# Patient Record
Sex: Female | Born: 1960 | Race: Black or African American | Hispanic: No | Marital: Single | Smoking: Never smoker
Health system: Southern US, Community
[De-identification: ages and names within clinical notes are randomized; demographics above are authoritative.]

## PROBLEM LIST (undated history)

## (undated) DIAGNOSIS — E119 Type 2 diabetes mellitus without complications: Secondary | ICD-10-CM

## (undated) DIAGNOSIS — G44009 Cluster headache syndrome, unspecified, not intractable: Secondary | ICD-10-CM

## (undated) DIAGNOSIS — G4730 Sleep apnea, unspecified: Secondary | ICD-10-CM

## (undated) DIAGNOSIS — B370 Candidal stomatitis: Secondary | ICD-10-CM

## (undated) DIAGNOSIS — T4145XA Adverse effect of unspecified anesthetic, initial encounter: Secondary | ICD-10-CM

## (undated) DIAGNOSIS — O009 Unspecified ectopic pregnancy without intrauterine pregnancy: Secondary | ICD-10-CM

## (undated) DIAGNOSIS — Z8639 Personal history of other endocrine, nutritional and metabolic disease: Secondary | ICD-10-CM

## (undated) DIAGNOSIS — T8859XA Other complications of anesthesia, initial encounter: Secondary | ICD-10-CM

## (undated) DIAGNOSIS — K50813 Crohn's disease of both small and large intestine with fistula: Secondary | ICD-10-CM

## (undated) DIAGNOSIS — R55 Syncope and collapse: Secondary | ICD-10-CM

## (undated) DIAGNOSIS — I10 Essential (primary) hypertension: Secondary | ICD-10-CM

## (undated) DIAGNOSIS — M109 Gout, unspecified: Secondary | ICD-10-CM

## (undated) DIAGNOSIS — K219 Gastro-esophageal reflux disease without esophagitis: Secondary | ICD-10-CM

## (undated) DIAGNOSIS — G4733 Obstructive sleep apnea (adult) (pediatric): Secondary | ICD-10-CM

## (undated) HISTORY — DX: Essential (primary) hypertension: I10

## (undated) HISTORY — PX: ECTOPIC PREGNANCY SURGERY: SHX613

## (undated) HISTORY — DX: Cluster headache syndrome, unspecified, not intractable: G44.009

## (undated) HISTORY — DX: Unspecified ectopic pregnancy without intrauterine pregnancy: O00.90

## (undated) HISTORY — DX: Sleep apnea, unspecified: G47.30

## (undated) HISTORY — DX: Type 2 diabetes mellitus without complications: E11.9

## (undated) HISTORY — DX: Obstructive sleep apnea (adult) (pediatric): G47.33

## (undated) HISTORY — PX: MYRINGOTOMY WITH TUBE PLACEMENT: SHX5663

## (undated) HISTORY — PX: TONSILLECTOMY: SUR1361

## (undated) HISTORY — DX: Candidal stomatitis: B37.0

## (undated) HISTORY — DX: Personal history of other endocrine, nutritional and metabolic disease: Z86.39

## (undated) HISTORY — PX: APPENDECTOMY: SHX54

## (undated) HISTORY — PX: TUBAL LIGATION: SHX77

---

## 1991-01-18 DIAGNOSIS — Z8639 Personal history of other endocrine, nutritional and metabolic disease: Secondary | ICD-10-CM

## 1991-01-18 HISTORY — DX: Personal history of other endocrine, nutritional and metabolic disease: Z86.39

## 1998-07-28 ENCOUNTER — Emergency Department (HOSPITAL_COMMUNITY): Admission: EM | Admit: 1998-07-28 | Discharge: 1998-07-28 | Payer: Self-pay | Admitting: Emergency Medicine

## 1998-07-28 ENCOUNTER — Encounter: Payer: Self-pay | Admitting: Emergency Medicine

## 1998-10-06 ENCOUNTER — Emergency Department (HOSPITAL_COMMUNITY): Admission: EM | Admit: 1998-10-06 | Discharge: 1998-10-06 | Payer: Self-pay | Admitting: Emergency Medicine

## 1998-10-06 ENCOUNTER — Encounter: Payer: Self-pay | Admitting: Emergency Medicine

## 2000-07-10 ENCOUNTER — Encounter: Payer: Self-pay | Admitting: Emergency Medicine

## 2000-07-10 ENCOUNTER — Emergency Department (HOSPITAL_COMMUNITY): Admission: EM | Admit: 2000-07-10 | Discharge: 2000-07-10 | Payer: Self-pay | Admitting: Emergency Medicine

## 2008-12-23 ENCOUNTER — Other Ambulatory Visit: Admission: RE | Admit: 2008-12-23 | Discharge: 2008-12-23 | Payer: Self-pay | Admitting: Family Medicine

## 2009-03-25 ENCOUNTER — Encounter: Admission: RE | Admit: 2009-03-25 | Discharge: 2009-03-25 | Payer: Self-pay | Admitting: Family Medicine

## 2009-05-22 ENCOUNTER — Emergency Department (HOSPITAL_BASED_OUTPATIENT_CLINIC_OR_DEPARTMENT_OTHER): Admission: EM | Admit: 2009-05-22 | Discharge: 2009-05-22 | Payer: Self-pay | Admitting: Emergency Medicine

## 2009-09-11 ENCOUNTER — Emergency Department (HOSPITAL_BASED_OUTPATIENT_CLINIC_OR_DEPARTMENT_OTHER): Admission: EM | Admit: 2009-09-11 | Discharge: 2009-09-11 | Payer: Self-pay | Admitting: Emergency Medicine

## 2009-11-17 ENCOUNTER — Encounter: Admission: RE | Admit: 2009-11-17 | Discharge: 2009-11-17 | Payer: Self-pay | Admitting: Otolaryngology

## 2010-01-17 HISTORY — PX: TONSILLECTOMY AND ADENOIDECTOMY: SHX28

## 2010-01-17 LAB — HM MAMMOGRAPHY: HM Mammogram: NORMAL

## 2010-01-17 LAB — HM PAP SMEAR: HM Pap smear: NORMAL

## 2010-02-07 ENCOUNTER — Encounter: Payer: Self-pay | Admitting: Obstetrics & Gynecology

## 2010-03-30 ENCOUNTER — Emergency Department (HOSPITAL_BASED_OUTPATIENT_CLINIC_OR_DEPARTMENT_OTHER)
Admission: EM | Admit: 2010-03-30 | Discharge: 2010-03-30 | Disposition: A | Discharge status: Home / Self Care | Payer: BC Managed Care – PPO | Attending: Emergency Medicine | Admitting: Emergency Medicine

## 2010-03-30 ENCOUNTER — Emergency Department (INDEPENDENT_AMBULATORY_CARE_PROVIDER_SITE_OTHER): Payer: BC Managed Care – PPO

## 2010-03-30 DIAGNOSIS — E119 Type 2 diabetes mellitus without complications: Secondary | ICD-10-CM | POA: Insufficient documentation

## 2010-03-30 DIAGNOSIS — E669 Obesity, unspecified: Secondary | ICD-10-CM | POA: Insufficient documentation

## 2010-03-30 DIAGNOSIS — R05 Cough: Secondary | ICD-10-CM

## 2010-03-30 DIAGNOSIS — I10 Essential (primary) hypertension: Secondary | ICD-10-CM | POA: Insufficient documentation

## 2010-03-30 DIAGNOSIS — R059 Cough, unspecified: Secondary | ICD-10-CM | POA: Insufficient documentation

## 2010-03-30 DIAGNOSIS — J329 Chronic sinusitis, unspecified: Secondary | ICD-10-CM | POA: Insufficient documentation

## 2010-08-13 ENCOUNTER — Ambulatory Visit: Payer: BC Managed Care – PPO | Admitting: Internal Medicine

## 2010-12-08 ENCOUNTER — Encounter: Payer: Self-pay | Admitting: Internal Medicine

## 2010-12-08 ENCOUNTER — Ambulatory Visit (INDEPENDENT_AMBULATORY_CARE_PROVIDER_SITE_OTHER): Payer: BC Managed Care – PPO | Admitting: Internal Medicine

## 2010-12-08 DIAGNOSIS — R739 Hyperglycemia, unspecified: Secondary | ICD-10-CM

## 2010-12-08 DIAGNOSIS — R7309 Other abnormal glucose: Secondary | ICD-10-CM

## 2010-12-08 DIAGNOSIS — G44009 Cluster headache syndrome, unspecified, not intractable: Secondary | ICD-10-CM | POA: Insufficient documentation

## 2010-12-08 DIAGNOSIS — I10 Essential (primary) hypertension: Secondary | ICD-10-CM

## 2010-12-08 DIAGNOSIS — E1122 Type 2 diabetes mellitus with diabetic chronic kidney disease: Secondary | ICD-10-CM | POA: Insufficient documentation

## 2010-12-08 DIAGNOSIS — M25561 Pain in right knee: Secondary | ICD-10-CM

## 2010-12-08 DIAGNOSIS — M7700 Medial epicondylitis, unspecified elbow: Secondary | ICD-10-CM

## 2010-12-08 DIAGNOSIS — M7701 Medial epicondylitis, right elbow: Secondary | ICD-10-CM

## 2010-12-08 DIAGNOSIS — M25569 Pain in unspecified knee: Secondary | ICD-10-CM

## 2010-12-08 MED ORDER — OLMESARTAN-AMLODIPINE-HCTZ 40-10-25 MG PO TABS: 1.0000 | ORAL_TABLET | Freq: Every day | ORAL | Status: DC

## 2010-12-08 MED ORDER — DICLOFENAC SODIUM 75 MG PO TBEC: 75.0000 mg | DELAYED_RELEASE_TABLET | Freq: Two times a day (BID) | ORAL | Status: DC

## 2010-12-08 NOTE — Patient Instructions (Signed)
It was good to see you today. we will send to your prior provider(s) for "release of records" as discussed today -  Resume TriBenzor for blood pressure control Start generic Voltaren for knee and elbow pain - 1 pill twice a day with food x 2 weeks, then as needed for pain Your prescription(s) have been submitted to your pharmacy. Please take as directed and contact our office if you believe you are having problem(s) with the medication(s). 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

## 2010-12-08 NOTE — Progress Notes (Signed)
Subjective:    Patient ID: Nancy Acevedo, female    DOB: 1960/01/30, 49 y.o.   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 Denies headache, chest pain or edema. No known personal cardiac history or renal disease  Past Medical History  Diagnosis Date  . Hypertension   . Headaches, cluster    Family History  Problem Relation Age of Onset  . Arthritis Other   . Diabetes Mother   . Hypertension Mother    History  Substance Use Topics  . Smoking status: Never Smoker   . Smokeless tobacco: Not on file  . Alcohol Use: No    Review of Systems Constitutional: Negative for fever or weight change.  Respiratory: Negative for cough and shortness of breath.   Cardiovascular: Negative for chest pain or palpitations.  Gastrointestinal: Negative for abdominal pain, no bowel changes.  Musculoskeletal: Negative for gait problem or joint swelling.  Skin: Negative for rash.  Neurological: Negative for dizziness or headache.  No other specific complaints in a complete review of systems (except as listed in HPI above).     Objective:   Physical Exam BP 152/104  Pulse 77  Temp(Src) 97.9 F (36.6 C) (Oral)  Ht 5\' 6"  (1.676 m)  Wt 269 lb (122.018 kg)  BMI 43.42 kg/m2  SpO2 97% Wt Readings from Last 3 Encounters:  12/08/10  269 lb (122.018 kg)   Constitutional: She is obese, but appears well-developed and well-nourished. No distress.  HENT: Head: Normocephalic and atraumatic. Ears: B TMs ok, no erythema or effusion; Nose: Nose normal.  Mouth/Throat: Oropharynx is clear and moist. No oropharyngeal exudate.  Eyes: Conjunctivae and EOM are normal. Pupils are equal, round, and reactive to light. No scleral icterus.  Neck: Normal range of motion. Neck supple. No JVD present. No thyromegaly present.  Cardiovascular: Normal rate, regular rhythm and normal heart sounds.  No murmur heard. No BLE edema. Pulmonary/Chest: Effort normal and breath sounds normal. No respiratory distress. She has no wheezes.  Abdominal: Soft. Bowel sounds are normal. She exhibits no distension. There is no tenderness. no masses Musculoskeletal: R elbow without deformity or swelling - tender over lateral epicondyle - R knee- +crepitus, tender to palpation over patellofemoral tendon, but FROM and ligamentous function intact  Normal range of motion, no joint effusions. No gross deformities Neurological: She is alert and oriented to person, place, and time. No cranial nerve deficit. Coordination normal.  Skin: Skin is warm and dry. No rash noted. No erythema.  Psychiatric: She has a normal mood and affect. Her behavior is normal. Judgment and thought content normal.   No results found for this basename: WBC, HGB, HCT, PLT, GLUCOSE, CHOL, TRIG, HDL, LDLDIRECT, LDLCALC, ALT, AST, NA, K, CL, CREATININE, BUN, CO2, TSH, PSA, INR,  GLUF, HGBA1C, MICROALBUR       Assessment & Plan:  R elbow pain - lateral epicondylitis due to repetative overuse - advised on dx and conservative tx - NSAIDs, counter force brace recommended  -   R knee pain - suspect patellofemoral tendonitis - NSAIDs, ice and strengthening exercises - consider PT - also advised on relation to weight and need for weight loss   Also see problem list. Medications and labs reviewed today.

## 2010-12-11 ENCOUNTER — Encounter: Payer: Self-pay | Admitting: Internal Medicine

## 2010-12-11 NOTE — Assessment & Plan Note (Signed)
Hx pregnancy induced hyperglycemia 1993 but "not diabetes mellitus after 3 hour test -GTT" Strong FH same and weight gain reviewed Check a1c and advised on need for healthy diet and exercise habits for weight reduction to control risk of DM dz No results found for this basename: HGBA1C

## 2010-12-11 NOTE — Assessment & Plan Note (Signed)
Resume triple therapy as recently prescribed =  advised on importance of med compliance and HTN association with other med dz if left uncontrolled Recheck 2-4 weeks and plan screening labs at that time  BP Readings from Last 3 Encounters:  12/08/10 152/104

## 2011-01-19 ENCOUNTER — Ambulatory Visit: Payer: BC Managed Care – PPO | Admitting: Internal Medicine

## 2011-02-17 ENCOUNTER — Ambulatory Visit (INDEPENDENT_AMBULATORY_CARE_PROVIDER_SITE_OTHER): Payer: BC Managed Care – PPO | Admitting: Internal Medicine

## 2011-02-17 ENCOUNTER — Other Ambulatory Visit (INDEPENDENT_AMBULATORY_CARE_PROVIDER_SITE_OTHER): Payer: BC Managed Care – PPO

## 2011-02-17 ENCOUNTER — Encounter: Payer: Self-pay | Admitting: Internal Medicine

## 2011-02-17 VITALS — BP 122/84 | HR 80 | Temp 98.5°F | Wt 266.4 lb

## 2011-02-17 DIAGNOSIS — R7989 Other specified abnormal findings of blood chemistry: Secondary | ICD-10-CM

## 2011-02-17 DIAGNOSIS — M25521 Pain in right elbow: Secondary | ICD-10-CM

## 2011-02-17 DIAGNOSIS — M25529 Pain in unspecified elbow: Secondary | ICD-10-CM

## 2011-02-17 DIAGNOSIS — Z0000 Encounter for general adult medical examination without abnormal findings: Secondary | ICD-10-CM

## 2011-02-17 DIAGNOSIS — M25561 Pain in right knee: Secondary | ICD-10-CM

## 2011-02-17 DIAGNOSIS — R7309 Other abnormal glucose: Secondary | ICD-10-CM

## 2011-02-17 DIAGNOSIS — I10 Essential (primary) hypertension: Secondary | ICD-10-CM

## 2011-02-17 DIAGNOSIS — R739 Hyperglycemia, unspecified: Secondary | ICD-10-CM

## 2011-02-17 DIAGNOSIS — M25569 Pain in unspecified knee: Secondary | ICD-10-CM

## 2011-02-17 DIAGNOSIS — Z23 Encounter for immunization: Secondary | ICD-10-CM

## 2011-02-17 DIAGNOSIS — R945 Abnormal results of liver function studies: Secondary | ICD-10-CM

## 2011-02-17 LAB — LIPID PANEL
LDL Cholesterol: 138 mg/dL — ABNORMAL HIGH (ref 0–99)
Total CHOL/HDL Ratio: 5
Triglycerides: 88 mg/dL (ref 0.0–149.0)

## 2011-02-17 LAB — BASIC METABOLIC PANEL
CO2: 29 mEq/L (ref 19–32)
Chloride: 106 mEq/L (ref 96–112)
Creatinine, Ser: 1 mg/dL (ref 0.4–1.2)
Glucose, Bld: 94 mg/dL (ref 70–99)

## 2011-02-17 LAB — URINALYSIS
Bilirubin Urine: NEGATIVE
Leukocytes, UA: NEGATIVE
Nitrite: NEGATIVE
Specific Gravity, Urine: 1.02 (ref 1.000–1.030)
Total Protein, Urine: NEGATIVE
pH: 5.5 (ref 5.0–8.0)

## 2011-02-17 LAB — CBC WITH DIFFERENTIAL/PLATELET
Basophils Absolute: 0.1 10*3/uL (ref 0.0–0.1)
Eosinophils Relative: 1.9 % (ref 0.0–5.0)
Hemoglobin: 11.9 g/dL — ABNORMAL LOW (ref 12.0–15.0)
Lymphs Abs: 1.9 10*3/uL (ref 0.7–4.0)
MCHC: 33.8 g/dL (ref 30.0–36.0)
MCV: 90.1 fl (ref 78.0–100.0)
Neutrophils Relative %: 51 % (ref 43.0–77.0)
RBC: 3.92 Mil/uL (ref 3.87–5.11)

## 2011-02-17 LAB — HEPATIC FUNCTION PANEL
Albumin: 4.2 g/dL (ref 3.5–5.2)
Alkaline Phosphatase: 49 U/L (ref 39–117)
Bilirubin, Direct: 0.1 mg/dL (ref 0.0–0.3)
Total Bilirubin: 0.6 mg/dL (ref 0.3–1.2)

## 2011-02-17 MED ORDER — METFORMIN HCL ER 500 MG PO TB24: 500.0000 mg | ORAL_TABLET | Freq: Every day | ORAL | Status: DC

## 2011-02-17 NOTE — Progress Notes (Signed)
Subjective:    Patient ID: Nancy Acevedo, female    DOB: 1960/03/26, 51 y.o.   MRN: [id number]  HPI  patient is here today for annual physical. 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 Denies headache, chest pain or edema. No known personal cardiac history or renal disease  Past Medical History  Diagnosis Date  . Hypertension   . Headaches, cluster   . History of hyperglycemia 1993   Family History  Problem Relation Age of Onset  . Arthritis Other   . Diabetes Mother   . Hypertension Mother    History  Substance Use Topics  . Smoking status: Never Smoker   . Smokeless tobacco: Not on file  . Alcohol Use: No    Review of Systems  Constitutional: Negative for fever or weight change.  Respiratory: Negative for cough and shortness of breath.   Cardiovascular: Negative for chest pain or palpitations.  Gastrointestinal: Negative for abdominal pain, no bowel changes.  Musculoskeletal: Negative for gait problem or joint swelling.  Skin: Negative for rash.  Neurological: Negative for dizziness or headache.  No other specific complaints in a complete review of systems (except as listed in HPI above).     Objective:   Physical Exam  BP 122/84  Pulse 80  Temp(Src) 98.5 F (36.9 C) (Oral)  Wt  266 lb 6.4 oz (120.838 kg)  SpO2 97% Wt Readings from Last 3 Encounters:  02/17/11 266 lb 6.4 oz (120.838 kg)  12/08/10 269 lb (122.018 kg)   Constitutional: She is obese, but appears well-developed and well-nourished. No distress.  HENT: NCAT, OP clear, TMs clear Eyes: uncorrected vision grossly intact - no icterus/conjunctivitis Neck: Thick; Normal range of motion. Neck supple. No JVD present. No thyromegaly present.  Cardiovascular: Normal rate, regular rhythm and normal heart sounds.  No murmur heard. No BLE edema. Pulmonary/Chest: Effort normal and breath sounds normal. No respiratory distress. She has no wheezes.  Abd: obese, SNTND, +BS Musculoskeletal: R elbow without deformity or swelling - tender over lateral epicondyle -  R knee: small mobile cyst lateral side of knee, +crepitus, tender to palpation over patellofemoral tendon and with palpation of "cyst", but FROM and ligamentous function intact- no joint effusions.  Neuro: AAOx4, CN 2-12 sym intact Skin: Skin is warm and dry. No rash noted. No erythema.  Psychiatric: She has a normal mood and affect. Her behavior is normal. Judgment and thought content normal.   No results found for this basename: WBC,  HGB,  HCT,  PLT,  GLUCOSE,  CHOL,  TRIG,  HDL,  LDLDIRECT,  LDLCALC,  ALT,  AST,  NA,  K,  CL,  CREATININE,  BUN,  CO2,  TSH,  PSA,  INR,  GLUF,  HGBA1C,  MICROALBUR  Assessment & Plan:  [facility] - v70.0 - Patient has been counseled on age-appropriate routine health concerns for screening and prevention. These are reviewed and up-to-date. Immunizations are up-to-date or declined. Labs ordered and will be reviewed.   R elbow pain -? lateral epicondylitis due to repetative overuse - improved but unresolved with conservative tx - NSAIDs, counter force brace recommended  - refer now to ortho  R knee pain - new small lateral "cyst" tender to touch - s/p NSAIDs, ice and strengthening exercises, minimally improved - refer to ortho and  consider PT - also advised on relation to weight and need for weight loss   Also see problem list. Medications and labs reviewed today.

## 2011-02-17 NOTE — Patient Instructions (Signed)
It was good to see you today. Test(s) ordered today. Your results will be called to you after review (48-72hours after test completion). If any changes need to be made, you will be notified at that time. we'll make referral to orthopedics for your knee and elbow pains. Our office will contact you regarding appointment(s) once made. Medications reviewed, no changes at this time. Refill on medication(s) as discussed today. Please schedule followup in 6 months for blood pressure check and weight check, call sooner if problems. Tdap vaccination updated today

## 2011-02-17 NOTE — Assessment & Plan Note (Signed)
Doing well on resumed Tribenzor (restarted 11/2010) The current medical regimen is effective;  continue present plan and medications.   BP Readings from Last 3 Encounters:  02/17/11 122/84  12/08/10 152/104

## 2011-02-17 NOTE — Assessment & Plan Note (Signed)
Incidental mild increase on [facility] labs - ?gatty liver or other Check Abd [place] now - no symptomatic GI dz on hx or exam

## 2011-02-17 NOTE — Assessment & Plan Note (Signed)
Hx pregnancy induced hyperglycemia 1993 but "not diabetes mellitus after 3 hour test -GTT" Strong FH same and weight gain reviewed Check a1c and advised on need for healthy diet and exercise habits for weight reduction to control risk of DM dz No results found for this basename: HGBA1C    

## 2011-02-18 ENCOUNTER — Ambulatory Visit: Payer: BC Managed Care – PPO | Admitting: Internal Medicine

## 2011-02-21 ENCOUNTER — Other Ambulatory Visit: Payer: BC Managed Care – PPO

## 2011-02-22 ENCOUNTER — Telehealth: Payer: Self-pay | Admitting: *Deleted

## 2011-02-22 DIAGNOSIS — E119 Type 2 diabetes mellitus without complications: Secondary | ICD-10-CM

## 2011-02-22 NOTE — Telephone Encounter (Signed)
Will do! thanks

## 2011-02-22 NOTE — Telephone Encounter (Signed)
Inform pt of labs results & md recommendations. Pt states she does want md to do a referral for her to see nutritionist.... 02/22/11@12 :58pm/LMB

## 2011-02-22 NOTE — Telephone Encounter (Signed)
Pt is aware of referral. Will received call back from [facility] once referral has been set-up... 02/22/11@2 :16pm/LB

## 2011-02-25 ENCOUNTER — Encounter: Payer: BC Managed Care – PPO | Admitting: *Deleted

## 2011-04-25 ENCOUNTER — Ambulatory Visit: Payer: BC Managed Care – PPO | Admitting: *Deleted

## 2011-04-25 ENCOUNTER — Encounter: Payer: Self-pay | Admitting: *Deleted

## 2011-05-18 ENCOUNTER — Encounter: Payer: Self-pay | Admitting: Internal Medicine

## 2011-05-18 ENCOUNTER — Ambulatory Visit (INDEPENDENT_AMBULATORY_CARE_PROVIDER_SITE_OTHER): Payer: BC Managed Care – PPO | Admitting: Internal Medicine

## 2011-05-18 VITALS — BP 130/82 | HR 91 | Temp 97.9°F

## 2011-05-18 DIAGNOSIS — M25571 Pain in right ankle and joints of right foot: Secondary | ICD-10-CM

## 2011-05-18 DIAGNOSIS — M67971 Unspecified disorder of synovium and tendon, right ankle and foot: Secondary | ICD-10-CM

## 2011-05-18 DIAGNOSIS — M25579 Pain in unspecified ankle and joints of unspecified foot: Secondary | ICD-10-CM

## 2011-05-18 NOTE — Progress Notes (Signed)
  Subjective:    Patient ID: Nancy Acevedo, female    DOB: May 24, 1960, 51 y.o.   MRN: [id number]  HPI Here for right ankle pain Precipitated by getting out of bed - denies injury or twist, no overuse Seen at [facility] ER 4/29 due to same>dx with sprain - instructed on ortho RICE and given ibuprofen + percocet  Increasing pain and difficulty walking - stays up on R foot toes due to pain in back of heel   Also here for follow up   DM2, mild - started metformin for same 01/2011 following dx at [facility]. the patient reports compliance with medication(s) as prescribed. Denies adverse side effects.  Hypertension - the patient reports compliance with medication(s) as prescribed. Denies adverse side effects.  Past Medical History  Diagnosis Date  . Hypertension   . Headaches, cluster   . History of hyperglycemia 1993  . Diabetes mellitus, type 2 dx 01/2011    Review of Systems  Constitutional: Negative for fever.  Musculoskeletal: Positive for gait problem. Negative for myalgias, back pain and joint swelling.       Objective:   Physical Exam BP 130/82  Pulse 91  Temp(Src) 97.9 F (36.6 C) (Oral)  SpO2 98% Constitutional: She appears well-developed and well-nourished. No distress. Cardiovascular: Normal rate, regular rhythm and normal heart sounds.  No murmur heard. No edema. Pulmonary/Chest: Effort normal and breath sounds normal. No respiratory distress. She has no wheezes.  Musculoskeletal: R ankle - mild diffuse swelling at achilles insertion, limited dorsiflexion due to pain - tender to palpation over distal Achilles. passive FROM and ligamentous fx intact - neurovasc intact distally  Lab Results  Component Value Date   WBC 5.2 02/17/2011   HGB 11.9* 02/17/2011   HCT 35.3* 02/17/2011   PLT 294.0 02/17/2011   GLUCOSE 94 02/17/2011   CHOL 199 02/17/2011   TRIG 88.0 02/17/2011   HDL 43.80 02/17/2011   LDLCALC 138* 02/17/2011   ALT 80* 02/17/2011   AST 64* 02/17/2011   NA 142 02/17/2011   K  4.1 02/17/2011   CL 106 02/17/2011   CREATININE 1.0 02/17/2011   BUN 13 02/17/2011   CO2 29 02/17/2011   TSH 0.98 02/17/2011   HGBA1C 6.8* 02/17/2011      Assessment & Plan:   R ankle pain - reports xrays done at [facility] negative - exam consistent with Achilles tendonitis, ?partial tear - no relief with high dose NSAIDs in last 24h - refer to ortho for eval today and PT

## 2011-05-18 NOTE — Patient Instructions (Signed)
It was good to see you today. We have reviewed your [place] [facility] records today we'll make referral to [facility] for today. Our office will contact you regarding appointment(s) once made.  Achilles Tendinitis Tendinitis a swelling and soreness of the tendon. The pain in the tendon (cord-like structure which attaches muscle to bone) is produced by tiny tears and the inflammation present in that tendon. It commonly occurs at the shoulders, heels, and elbows. It is usually caused by overusing the tendon and joint involved. Achilles tendinitis involves the Achilles tendon. This is the large tendon in the back of the leg just above the foot. It attaches the large muscles of the lower leg to the heel bone (called calcaneus).   This diagnosis (learning what is wrong) is made by examination. X-rays will be generally be normal if only tendinitis is present. HOME CARE INSTRUCTIONS    Apply ice to the injury for 15 to 20 minutes, 3 to 4 times per day. Put the ice in a plastic bag and place a towel between the bag of ice and your skin.   Try to avoid use other than gentle range of motion while the tendon is painful. Do not resume use until instructed by your caregiver. Then begin use gradually. Do not increase use to the point of pain. If pain does develop, decrease use and continue the above measures. Gradually increase activities that do not cause discomfort until you gradually achieve normal use.   Only take over-the-counter or prescription medicines for pain, discomfort, or fever as directed by your caregiver.  SEEK MEDICAL CARE IF:    Your pain and swelling increase or pain is uncontrolled with medications.   You develop new, unexplained problems (symptoms) or an increase of the symptoms that brought you to your caregiver.   You develop an inability to move your toes or foot, develop warmth and swelling in your foot, or begin running an unexplained temperature.  MAKE SURE YOU:    Understand these  instructions.   Will watch your condition.   Will get help right away if you are not doing well or get worse.  Document Released: 10/13/2004 Document Revised: 12/23/2010 Document Reviewed: 08/22/2007 [facility] Patient Information 2012 [place], [place].

## 2011-05-27 ENCOUNTER — Ambulatory Visit: Payer: BC Managed Care – PPO | Admitting: Internal Medicine

## 2011-06-17 ENCOUNTER — Ambulatory Visit (INDEPENDENT_AMBULATORY_CARE_PROVIDER_SITE_OTHER): Payer: BC Managed Care – PPO | Admitting: Internal Medicine

## 2011-06-17 ENCOUNTER — Encounter: Payer: Self-pay | Admitting: Internal Medicine

## 2011-06-17 ENCOUNTER — Other Ambulatory Visit (INDEPENDENT_AMBULATORY_CARE_PROVIDER_SITE_OTHER): Payer: BC Managed Care – PPO

## 2011-06-17 VITALS — BP 126/82 | HR 91 | Temp 98.6°F | Wt 264.1 lb

## 2011-06-17 DIAGNOSIS — L309 Dermatitis, unspecified: Secondary | ICD-10-CM

## 2011-06-17 DIAGNOSIS — L259 Unspecified contact dermatitis, unspecified cause: Secondary | ICD-10-CM

## 2011-06-17 DIAGNOSIS — E119 Type 2 diabetes mellitus without complications: Secondary | ICD-10-CM

## 2011-06-17 DIAGNOSIS — F329 Major depressive disorder, single episode, unspecified: Secondary | ICD-10-CM | POA: Insufficient documentation

## 2011-06-17 DIAGNOSIS — E669 Obesity, unspecified: Secondary | ICD-10-CM | POA: Insufficient documentation

## 2011-06-17 LAB — HEMOGLOBIN A1C: Hgb A1c MFr Bld: 6.5 % (ref 4.6–6.5)

## 2011-06-17 MED ORDER — BUPROPION HCL ER (XL) 150 MG PO TB24: 150.0000 mg | ORAL_TABLET | Freq: Every day | ORAL | Status: DC

## 2011-06-17 MED ORDER — TRIAMCINOLONE ACETONIDE 0.1 % EX CREA: TOPICAL_CREAM | Freq: Two times a day (BID) | CUTANEOUS | Status: DC

## 2011-06-17 NOTE — Assessment & Plan Note (Signed)
Classic symptoms present >35mo Self tx with eating binge Start wellbutrin - we reviewed potential risk/benefit and possible side effects - pt understands and agrees to same  follow up 6 weeks, sooner if problems Verified no I/HI

## 2011-06-17 NOTE — Assessment & Plan Note (Signed)
Wt Readings from Last 3 Encounters:  06/17/11 264 lb 1.9 oz (119.804 kg)  02/17/11 266 lb 6.4 oz (120.838 kg)  12/08/10 269 lb (122.018 kg)   The patient is asked to make an attempt to improve diet and exercise patterns to aid in medical management of this problem. Refer for consideration of bariatric intervention

## 2011-06-17 NOTE — Assessment & Plan Note (Signed)
Hx pregnancy induced hyperglycemia 1993 but "not diabetes mellitus after 3 hour test -GTT" New dx 01/2011 - started metformin, s/p nutrition eval for same recheck a1c now, adjust meds prn Again advised on need for healthy diet and exercise habits for weight reduction to control risk of DM dz Lab Results  Component Value Date   HGBA1C 6.8* 02/17/2011

## 2011-06-17 NOTE — Progress Notes (Signed)
  Subjective:    Patient ID: Nancy Acevedo, female    DOB: 05/24/1960, 51 y.o.   MRN: [id number]  HPI  Here for follow up   DM2 - started metformin late 1/20913 for same - the patient reports compliance with medication(s) as prescribed. Denies adverse side effects.  Obesity - frustrated with inability to lose weight - walking and cooking at home instead of eating out - unable to afford weight watchers  HTN - the patient reports compliance with medication(s) as prescribed. Denies adverse side effects.   Past Medical History  Diagnosis Date  . Hypertension   . Headaches, cluster   . History of hyperglycemia 1993  . Diabetes mellitus, type 2 dx 01/2011    Review of Systems Skin: itch b thighs due to "sweat" Psyc: complains of depression symptoms: poor sleep, irritability, short fuse, feeling hopeless - denies SI    Objective:   Physical Exam BP 126/82  Pulse 91  Temp(Src) 98.6 F (37 C) (Oral)  Wt 264 lb 1.9 oz (119.804 kg)  SpO2 98% Wt Readings from Last 3 Encounters:  06/17/11 264 lb 1.9 oz (119.804 kg)  02/17/11 266 lb 6.4 oz (120.838 kg)  12/08/10 269 lb (122.018 kg)   Constitutional: She is obese, but appears well-developed and well-nourished. No distress.  Neck: Normal range of motion. Neck supple. No JVD present. No thyromegaly present.  Cardiovascular: Normal rate, regular rhythm and normal heart sounds.  No murmur heard. No BLE edema. Pulmonary/Chest: Effort normal and breath sounds normal. No respiratory distress. She has no wheezes.  Skin: Mild contact dermatitis B inner thighs - oher skin is warm and dry. No rash noted. No erythema.  Psychiatric: She has a depressed mood and tearful affect. Her behavior is normal. Judgment and thought content normal.   Lab Results  Component Value Date   WBC 5.2 02/17/2011   HGB 11.9* 02/17/2011   HCT 35.3* 02/17/2011   PLT 294.0 02/17/2011   GLUCOSE 94 02/17/2011   CHOL 199 02/17/2011   TRIG 88.0 02/17/2011   HDL 43.80  02/17/2011   LDLCALC 138* 02/17/2011   ALT 80* 02/17/2011   AST 64* 02/17/2011   NA 142 02/17/2011   K 4.1 02/17/2011   CL 106 02/17/2011   CREATININE 1.0 02/17/2011   BUN 13 02/17/2011   CO2 29 02/17/2011   TSH 0.98 02/17/2011   HGBA1C 6.8* 02/17/2011      Assessment & Plan:  See problem list. Medications and labs reviewed today.   dermatitis - triamcinolone prn

## 2011-06-17 NOTE — Patient Instructions (Signed)
It was good to see you today. Test(s) ordered today. Your results will be called to you after review (48-72hours after test completion). If any changes need to be made, you will be notified at that time. Start wellbutrin daily for depression symptoms -Your prescription(s) have been submitted to your pharmacy. Please take as directed and contact our office if you believe you are having problem(s) with the medication(s). we'll make referral to bariatric meeting for possible help with weight loss goals . Our office will contact you regarding appointment(s) once made. Please schedule followup in 6 weeks to recheck depression and medications, call sooner if problems.

## 2011-07-11 ENCOUNTER — Ambulatory Visit: Payer: BC Managed Care – PPO | Admitting: Internal Medicine

## 2011-07-11 DIAGNOSIS — Z0289 Encounter for other administrative examinations: Secondary | ICD-10-CM

## 2011-07-22 ENCOUNTER — Ambulatory Visit (INDEPENDENT_AMBULATORY_CARE_PROVIDER_SITE_OTHER): Payer: BC Managed Care – PPO | Admitting: Surgery

## 2011-07-26 ENCOUNTER — Encounter (INDEPENDENT_AMBULATORY_CARE_PROVIDER_SITE_OTHER): Payer: Self-pay | Admitting: Surgery

## 2011-08-17 ENCOUNTER — Ambulatory Visit: Payer: BC Managed Care – PPO | Admitting: Internal Medicine

## 2011-08-26 ENCOUNTER — Ambulatory Visit (INDEPENDENT_AMBULATORY_CARE_PROVIDER_SITE_OTHER): Payer: BC Managed Care – PPO | Admitting: Internal Medicine

## 2011-08-26 ENCOUNTER — Encounter: Payer: Self-pay | Admitting: Internal Medicine

## 2011-08-26 VITALS — BP 132/100 | HR 73 | Temp 98.5°F | Ht 66.0 in | Wt 274.8 lb

## 2011-08-26 DIAGNOSIS — R05 Cough: Secondary | ICD-10-CM

## 2011-08-26 DIAGNOSIS — E669 Obesity, unspecified: Secondary | ICD-10-CM

## 2011-08-26 DIAGNOSIS — F329 Major depressive disorder, single episode, unspecified: Secondary | ICD-10-CM

## 2011-08-26 MED ORDER — BENZONATATE 100 MG PO CAPS: 100.0000 mg | ORAL_CAPSULE | Freq: Two times a day (BID) | ORAL | Status: AC | PRN

## 2011-08-26 MED ORDER — FAMOTIDINE 20 MG PO TABS: 20.0000 mg | ORAL_TABLET | Freq: Two times a day (BID) | ORAL | Status: DC

## 2011-08-26 MED ORDER — PAROXETINE HCL 10 MG PO TABS: 10.0000 mg | ORAL_TABLET | ORAL | Status: DC

## 2011-08-26 MED ORDER — PHENTERMINE HCL 37.5 MG PO CAPS: 37.5000 mg | ORAL_CAPSULE | ORAL | Status: DC

## 2011-08-26 NOTE — Progress Notes (Signed)
  Subjective:    Patient ID: Nancy Acevedo, female    DOB: 1960-12-11, 51 y.o.   MRN: [id number]  HPI   Here for follow up depression - started Wellbutrin 5/31 - took for 4 weeks but stopped same due to "feeling weird and disconnected" - still poor sleep, irritability, short fuse, and hopeless - denies SI. continued stressors and would consider alt therapy if needed  DM2 - started metformin late 1/20913 for same - the patient reports compliance with medication(s) as prescribed. Denies adverse side effects.  Obesity - frustrated with inability to lose weight - walking and cooking at home instead of eating out - unable to afford weight watchers or bariatric surgery  HTN - the patient reports compliance with medication(s) as prescribed. Denies adverse side effects.   Past Medical History  Diagnosis Date  . Hypertension   . Headaches, cluster   . History of hyperglycemia 1993  . Diabetes mellitus, type 2 dx 01/2011    Review of Systems  Constitutional: Positive for fatigue. Negative for fever.  Respiratory: Positive for cough. Negative for shortness of breath.   Cardiovascular: Negative for chest pain and leg swelling.        Objective:   Physical Exam  BP 132/100  Pulse 73  Temp 98.5 F (36.9 C) (Oral)  Ht 5\' 6"  (1.676 m)  Wt 274 lb 12.8 oz (124.648 kg)  BMI 44.35 kg/m2  SpO2 97% Wt Readings from Last 3 Encounters:  08/26/11 274 lb 12.8 oz (124.648 kg)  06/17/11 264 lb 1.9 oz (119.804 kg)  02/17/11 266 lb 6.4 oz (120.838 kg)   Constitutional: She is obese, but appears well-developed and well-nourished. No distress.  Neck: Normal range of motion. Neck supple. No JVD present. No thyromegaly present.  Cardiovascular: Normal rate, regular rhythm and normal heart sounds.  No murmur heard. No BLE edema. Pulmonary/Chest: Effort normal and breath sounds normal. No respiratory distress. She has no wheezes.  Psychiatric: She has a depressed mood and tearful affect. Her behavior  is normal. Judgment and thought content normal.   Lab Results  Component Value Date   WBC 5.2 02/17/2011   HGB 11.9* 02/17/2011   HCT 35.3* 02/17/2011   PLT 294.0 02/17/2011   GLUCOSE 94 02/17/2011   CHOL 199 02/17/2011   TRIG 88.0 02/17/2011   HDL 43.80 02/17/2011   LDLCALC 138* 02/17/2011   ALT 80* 02/17/2011   AST 64* 02/17/2011   NA 142 02/17/2011   K 4.1 02/17/2011   CL 106 02/17/2011   CREATININE 1.0 02/17/2011   BUN 13 02/17/2011   CO2 29 02/17/2011   TSH 0.98 02/17/2011   HGBA1C 6.5 06/17/2011      Assessment & Plan:   Cough - dry but ongoing x 4 weeks - exam clear and O2 normal - tessalon prn  See problem list. Medications and labs reviewed today.

## 2011-08-26 NOTE — Patient Instructions (Signed)
It was good to see you today. Start paxil daily for depression symptoms, tessalon as needed for cough, phentermine for weight loss and pepcid for cough ever day - Your prescription(s) have been given to you or submitted to your pharmacy. Please take as directed and contact our office if you believe you are having problem(s) with the medication(s). Please schedule followup in 6 weeks to recheck depression and medications, call sooner if problems.

## 2011-08-26 NOTE — Assessment & Plan Note (Signed)
Wt Readings from Last 3 Encounters:  08/26/11 274 lb 12.8 oz (124.648 kg)  06/17/11 264 lb 1.9 oz (119.804 kg)  02/17/11 266 lb 6.4 oz (120.838 kg)   The patient is asked to make an attempt to improve diet and exercise patterns to aid in medical management of this problem. Unable to consider bariatric intervention due to cost Trial phentermine x 30d -

## 2011-08-26 NOTE — Assessment & Plan Note (Signed)
Classic symptoms present >35mo Self tx with eating binge Intol of wellbutrin 06/2011 - self dc'd same Start paxil now - we reviewed potential risk/benefit and possible side effects - pt understands and agrees to same  follow up 6 weeks, sooner if problems Verified no SI/HI

## 2011-10-10 ENCOUNTER — Ambulatory Visit: Payer: BC Managed Care – PPO | Admitting: Internal Medicine

## 2011-10-10 DIAGNOSIS — Z0289 Encounter for other administrative examinations: Secondary | ICD-10-CM

## 2011-11-15 ENCOUNTER — Other Ambulatory Visit: Payer: Self-pay | Admitting: *Deleted

## 2011-11-15 MED ORDER — OLMESARTAN-AMLODIPINE-HCTZ 40-10-25 MG PO TABS: 1.0000 | ORAL_TABLET | Freq: Every day | ORAL | Status: DC

## 2011-11-15 NOTE — Telephone Encounter (Signed)
Left msg on vm needing new rx on BP med. She is completely out. Called pt back no answer LMOM will send Tribenzor to [facility].....Raechel Chute

## 2012-02-24 ENCOUNTER — Encounter: Payer: Self-pay | Admitting: Internal Medicine

## 2012-02-24 ENCOUNTER — Other Ambulatory Visit (INDEPENDENT_AMBULATORY_CARE_PROVIDER_SITE_OTHER): Payer: BC Managed Care – PPO

## 2012-02-24 ENCOUNTER — Ambulatory Visit (INDEPENDENT_AMBULATORY_CARE_PROVIDER_SITE_OTHER): Payer: BC Managed Care – PPO | Admitting: Internal Medicine

## 2012-02-24 VITALS — BP 152/100 | HR 85 | Temp 98.7°F | Wt 261.4 lb

## 2012-02-24 DIAGNOSIS — Z1239 Encounter for other screening for malignant neoplasm of breast: Secondary | ICD-10-CM

## 2012-02-24 DIAGNOSIS — G4733 Obstructive sleep apnea (adult) (pediatric): Secondary | ICD-10-CM

## 2012-02-24 DIAGNOSIS — R51 Headache: Secondary | ICD-10-CM

## 2012-02-24 DIAGNOSIS — E119 Type 2 diabetes mellitus without complications: Secondary | ICD-10-CM

## 2012-02-24 DIAGNOSIS — I10 Essential (primary) hypertension: Secondary | ICD-10-CM

## 2012-02-24 DIAGNOSIS — Z1231 Encounter for screening mammogram for malignant neoplasm of breast: Secondary | ICD-10-CM

## 2012-02-24 DIAGNOSIS — F329 Major depressive disorder, single episode, unspecified: Secondary | ICD-10-CM

## 2012-02-24 LAB — BASIC METABOLIC PANEL
Calcium: 9 mg/dL (ref 8.4–10.5)
GFR: 79.03 mL/min (ref >60.00)
Glucose, Bld: 97 mg/dL (ref 70–99)
Potassium: 3.7 mEq/L (ref 3.5–5.1)
Sodium: 141 mEq/L (ref 135–145)

## 2012-02-24 LAB — LIPID PANEL
Cholesterol: 176 mg/dL (ref 0–200)
HDL: 42.1 mg/dL (ref >39.00)
Triglycerides: 82 mg/dL (ref 0.0–149.0)
VLDL: 16.4 mg/dL (ref 0.0–40.0)

## 2012-02-24 LAB — HEMOGLOBIN A1C: Hgb A1c MFr Bld: 6.2 % (ref 4.6–6.5)

## 2012-02-24 MED ORDER — METOPROLOL SUCCINATE ER 50 MG PO TB24: 50.0000 mg | ORAL_TABLET | Freq: Every day | ORAL | Status: DC

## 2012-02-24 MED ORDER — CITALOPRAM HYDROBROMIDE 10 MG PO TABS: 10.0000 mg | ORAL_TABLET | Freq: Every day | ORAL | Status: DC

## 2012-02-24 NOTE — Assessment & Plan Note (Signed)
resumed Tribenzor 11/2010 -reports compliance with same Add extended release beta blocker, may help with headache prophylaxis Refer for treatment of uncontrolled sleep apnea, see above Patient to notify us if blood pressure remains greater than 140/85  BP Readings from Last 3 Encounters:  02/24/12 152/100  08/26/11 132/100  06/17/11 126/82

## 2012-02-24 NOTE — Assessment & Plan Note (Signed)
Classic symptoms present > 6 mo Self tx with eating binge Intol of wellbutrin 06/2011 - self dc'd same Same intolerance of paxil trial 08/2011 x 30d Start citalopram now - we reviewed potential risk/benefit and possible side effects - pt understands and agrees to same  follow up 6 weeks, sooner if problems Verified no SI/HI

## 2012-02-24 NOTE — Progress Notes (Signed)
  Subjective:    Patient ID: Nancy Acevedo, female    DOB: 13-Dec-1960, 52 y.o.   MRN: [id number]  Cough Associated symptoms include shortness of breath. Pertinent negatives include no chest pain or fever.  Shortness of Breath Pertinent negatives include no chest pain, fever or leg swelling.   Here for follow up - reviewed chronic medical issues:  depression - started Wellbutrin 05/2010 - took for 4 weeks but stopped same due to "feeling weird and disconnected" ; similar effects with Paxil trial August 2013, stopped after 30 days- still poor sleep, irritability, short fuse, and hopeless - denies SI. continued stressors and would consider alt therapy if needed  DM2 - started metformin late 01/2011 for same - the patient reports variable compliance with medication(s) as prescribed. Denies adverse side effects.  Obesity - frustrated with inability to lose weight - walking and cooking at home instead of eating out - unable to afford weight watchers or bariatric surgery  HTN - the patient reports compliance with medication(s) as prescribed. Denies adverse side effects.  OSA - noncompliance with CPAP due to machine problems  Past Medical History  Diagnosis Date  . Hypertension   . Headaches, cluster   . History of hyperglycemia 1993  . Diabetes mellitus, type 2 dx 01/2011    Review of Systems  Constitutional: Positive for fatigue. Negative for fever.  Respiratory: Positive for cough and shortness of breath.   Cardiovascular: Negative for chest pain and leg swelling.        Objective:   Physical Exam  BP 152/100  Pulse 85  Temp 98.7 F (37.1 C) (Oral)  Wt 261 lb 6.4 oz (118.57 kg)  SpO2 98% Wt Readings from Last 3 Encounters:  02/24/12 261 lb 6.4 oz (118.57 kg)  08/26/11 274 lb 12.8 oz (124.648 kg)  06/17/11 264 lb 1.9 oz (119.804 kg)   Constitutional: She is obese, but appears well-developed and well-nourished. No distress.  Neck: Normal range of motion. Neck supple. No JVD  present. No thyromegaly present.  Cardiovascular: Normal rate, regular rhythm and normal heart sounds.  No murmur heard. No BLE edema. Pulmonary/Chest: Effort normal and breath sounds normal. No respiratory distress. She has no wheezes.  Psychiatric: She has a depressed mood and tearful affect. Her behavior is normal. Judgment and thought content normal.   Lab Results  Component Value Date   WBC 5.2 02/17/2011   HGB 11.9* 02/17/2011   HCT 35.3* 02/17/2011   PLT 294.0 02/17/2011   GLUCOSE 97 02/24/2012   CHOL 176 02/24/2012   TRIG 82.0 02/24/2012   HDL 42.10 02/24/2012   LDLCALC 118* 02/24/2012   ALT 80* 02/17/2011   AST 64* 02/17/2011   NA 141 02/24/2012   K 3.7 02/24/2012   CL 106 02/24/2012   CREATININE 1.0 02/24/2012   BUN 13 02/24/2012   CO2 29 02/24/2012   TSH 0.98 02/17/2011   HGBA1C 6.2 02/24/2012      Assessment & Plan:   Cough - dry but ongoing  > 2 months - exam clear and O2 normal - resume tessalon prn, encourage compliance with H2B and refer to pulmonary  Also see problem list. Medications and labs reviewed today.

## 2012-02-24 NOTE — Assessment & Plan Note (Signed)
Reports last sleep study greater than 3 years ago, no report on file Currently noncompliant with CPAP due to machine problems Refer to sleep specialist/pulmonary for reevaluation of condition, possible repeat sleep study as needed Educated on relationship between uncontrolled hypertension and uncontrolled sleep apnea

## 2012-02-24 NOTE — Assessment & Plan Note (Signed)
Hx pregnancy induced hyperglycemia 1993 but "not diabetes mellitus after 3 hour test -GTT" New dx 01/2011 - started metformin, but variable compliance with same s/p nutrition eval for same recheck a1c now, adjust meds prn Again advised on need for healthy diet and exercise habits for weight reduction to control risk of DM dz Lab Results  Component Value Date   HGBA1C 6.2 02/24/2012

## 2012-02-24 NOTE — Patient Instructions (Signed)
It was good to see you today. Medications reviewed and updated Start low-dose Celexa in place of Paxil for mood and depression Start once daily Toprol for high blood pressure and headache symptoms Your prescription(s) have been submitted to your pharmacy. Please take as directed and contact our office if you believe you are having problem(s) with the medication(s). No other medication changes recommended at this time -may consider resuming metformin depending on lab results Test(s) ordered today. Your results will be released to MyChart (or called to you) after review, usually within 72hours after test completion. If any changes need to be made, you will be notified at that same time. we'll make referral to sleep specialist for evaluation of your obstructive sleep apnea and CPAP needs. Also for new mammogram at [facility]. Our office will contact you regarding appointment(s) once made. Please schedule followup in 6-8 weeks for medication and symptoms recheck, call sooner if problems.

## 2012-02-27 ENCOUNTER — Other Ambulatory Visit: Payer: Self-pay | Admitting: *Deleted

## 2012-02-27 MED ORDER — METFORMIN HCL ER 500 MG PO TB24: 500.0000 mg | ORAL_TABLET | Freq: Every day | ORAL | Status: DC

## 2012-02-27 NOTE — Telephone Encounter (Signed)
Notified pt with lab results. Pt states she is out of her metformin requesting rx sent to pharmacy...Raechel Chute

## 2012-03-08 ENCOUNTER — Encounter (HOSPITAL_COMMUNITY): Payer: Self-pay | Admitting: *Deleted

## 2012-03-08 ENCOUNTER — Emergency Department (HOSPITAL_COMMUNITY): Payer: BC Managed Care – PPO

## 2012-03-08 ENCOUNTER — Emergency Department (HOSPITAL_COMMUNITY)
Admission: EM | Admit: 2012-03-08 | Discharge: 2012-03-08 | Disposition: A | Discharge status: Home / Self Care | Payer: BC Managed Care – PPO | Attending: Emergency Medicine | Admitting: Emergency Medicine

## 2012-03-08 DIAGNOSIS — Z79899 Other long term (current) drug therapy: Secondary | ICD-10-CM | POA: Insufficient documentation

## 2012-03-08 DIAGNOSIS — Z8679 Personal history of other diseases of the circulatory system: Secondary | ICD-10-CM | POA: Insufficient documentation

## 2012-03-08 DIAGNOSIS — Z3202 Encounter for pregnancy test, result negative: Secondary | ICD-10-CM | POA: Insufficient documentation

## 2012-03-08 DIAGNOSIS — R110 Nausea: Secondary | ICD-10-CM | POA: Insufficient documentation

## 2012-03-08 DIAGNOSIS — E119 Type 2 diabetes mellitus without complications: Secondary | ICD-10-CM | POA: Insufficient documentation

## 2012-03-08 DIAGNOSIS — I10 Essential (primary) hypertension: Secondary | ICD-10-CM | POA: Insufficient documentation

## 2012-03-08 DIAGNOSIS — R55 Syncope and collapse: Secondary | ICD-10-CM | POA: Insufficient documentation

## 2012-03-08 LAB — CBC WITH DIFFERENTIAL/PLATELET
Basophils Absolute: 0 10*3/uL (ref 0.0–0.1)
Basophils Relative: 0 % (ref 0–1)
HCT: 37.8 % (ref 36.0–46.0)
MCHC: 34.4 g/dL (ref 30.0–36.0)
Monocytes Absolute: 0.5 10*3/uL (ref 0.1–1.0)
Neutro Abs: 2.3 10*3/uL (ref 1.7–7.7)
Neutrophils Relative %: 44 % (ref 43–77)
Platelets: 308 10*3/uL (ref 150–400)
RDW: 13.2 % (ref 11.5–15.5)

## 2012-03-08 LAB — BASIC METABOLIC PANEL
Chloride: 99 mEq/L (ref 96–112)
Creatinine, Ser: 1.14 mg/dL — ABNORMAL HIGH (ref 0.50–1.10)
GFR calc Af Amer: 64 mL/min — ABNORMAL LOW (ref >90)

## 2012-03-08 LAB — URINALYSIS, ROUTINE W REFLEX MICROSCOPIC
Leukocytes, UA: NEGATIVE
Nitrite: NEGATIVE
Protein, ur: NEGATIVE mg/dL
Urobilinogen, UA: 0.2 mg/dL (ref 0.0–1.0)

## 2012-03-08 LAB — TROPONIN I: Troponin I: <0.3 ng/mL (ref <0.30)

## 2012-03-08 LAB — POCT PREGNANCY, URINE: Preg Test, Ur: NEGATIVE

## 2012-03-08 NOTE — ED Provider Notes (Signed)
History     CSN: [id number]  Arrival date & time 03/08/12  1456   First MD Initiated Contact with Patient 03/08/12 1528      Chief Complaint  Patient presents with  . Loss of Consciousness    (Consider location/radiation/quality/duration/timing/severity/associated sxs/prior treatment) Patient is a 52 y.o. female presenting with syncope.  Loss of Consciousness    Pt reports she was at work in a warm room when she began to feel nauseated and lightheaded. She walked out of the room and had a brief syncopal episode. She denies any prodromal chest pain, SOB, palpations. She is back to baseline now. She denies history of same. She reports occasional mild chest pressure off and on for the last several weeks/months but none today. She did not hit her head or injure herself in the episode.   Past Medical History  Diagnosis Date  . Hypertension   . Headaches, cluster   . History of hyperglycemia 1993  . Diabetes mellitus, type 2 dx 01/2011    Past Surgical History  Procedure Laterality Date  . Tonsillectomy and adenoidectomy  2012    Family History  Problem Relation Age of Onset  . Arthritis Other   . Diabetes Mother   . Hypertension Mother     History  Substance Use Topics  . Smoking status: Never Smoker   . Smokeless tobacco: Not on file  . Alcohol Use: No    OB History   Grav Para Term Preterm Abortions TAB SAB Ect Mult Living                  Review of Systems  Cardiovascular: Positive for syncope.   All other systems reviewed and are negative except as noted in HPI.   Allergies  Review of patient's allergies indicates no known allergies.  Home Medications   Current Outpatient Rx  Name  Route  Sig  Dispense  Refill  . citalopram (CELEXA) 10 MG tablet   Oral   Take 1 tablet (10 mg total) by mouth daily.   30 tablet   5   . famotidine (PEPCID) 20 MG tablet   Oral   Take 1 tablet (20 mg total) by mouth 2 (two) times daily.   30 tablet   3   .  metFORMIN (GLUCOPHAGE-XR) 500 MG 24 hr tablet   Oral   Take 1 tablet (500 mg total) by mouth daily with breakfast.   30 tablet   5   . metoprolol succinate (TOPROL-XL) 50 MG 24 hr tablet   Oral   Take 1 tablet (50 mg total) by mouth daily. Take with or immediately following a meal.   90 tablet   3   . Olmesartan-Amlodipine-HCTZ (TRIBENZOR) 40-10-25 MG TABS   Oral   Take 1 tablet by mouth daily.   30 tablet   6     BP 121/79  Pulse 82  Temp(Src) 98.4 F (36.9 C) (Oral)  Resp 14  SpO2 97%  Physical Exam  Nursing note and vitals reviewed. Constitutional: She is oriented to person, place, and time. She appears well-developed and well-nourished.  HENT:  Head: Normocephalic and atraumatic.  Eyes: EOM are normal. Pupils are equal, round, and reactive to light.  Neck: Normal range of motion. Neck supple.  Cardiovascular: Normal rate, normal heart sounds and intact distal pulses.   Pulmonary/Chest: Effort normal and breath sounds normal.  Abdominal: Bowel sounds are normal. She exhibits no distension. There is no tenderness.  Musculoskeletal: Normal range of  motion. She exhibits no edema and no tenderness.  Neurological: She is alert and oriented to person, place, and time. She has normal strength. No cranial nerve deficit or sensory deficit.  Skin: Skin is warm and dry. No rash noted.  Psychiatric: She has a normal mood and affect.    ED Course  Procedures (including critical care time)  Labs Reviewed  BASIC METABOLIC PANEL - Abnormal; Notable for the following:    Glucose, Bld 107 (*)    Creatinine, Ser 1.14 (*)    GFR calc non Af Amer 55 (*)    GFR calc Af Amer 64 (*)    All other components within normal limits  URINALYSIS, ROUTINE W REFLEX MICROSCOPIC  TROPONIN I  CBC WITH DIFFERENTIAL  POCT PREGNANCY, URINE   Dg Chest 2 View  03/08/2012  *RADIOLOGY REPORT*  Clinical Data: Syncope.  CHEST - 2 VIEW  Comparison: 03/30/2010.  Findings: Trachea is midline.  Heart  size normal.  Lungs are clear. No pleural fluid.  IMPRESSION: No acute findings.   Original Report Authenticated By: Leanna Battles, M.D.      No diagnosis found.    MDM   Date: 03/08/2012  Rate: 64  Rhythm: normal sinus rhythm  QRS Axis: normal  Intervals: normal  ST/T Wave abnormalities: normal  Conduction Disutrbances: none  Narrative Interpretation: LVH criteria, no old to compare  Pt with likely vasovagal syncope. Low risk by [facility] syncope rule. Given reported chest pains off and on, will check labs and CXR. Anticipate discharge if all negative.     5:17 PM Labs and imaging unremarkable. Not orthostatic. Doubt cardiac etiology, suspect this is vasovagal. Advised to follow up with PCP for further evaluation.        Briannie Gutierrez B. Bernette Mayers, MD 03/08/12 1718

## 2012-03-08 NOTE — ED Notes (Signed)
Pt states that she felt nauseated and walked towards desk and "passed out" lasting approx <5 minutes according to pt. Pt states that she was "shaky" and thirsty afterwards. CBG was reported to be 80 with EMS. Pt states that she has not been eating or drinking well lately due to stress. Pt states

## 2012-03-14 ENCOUNTER — Encounter: Payer: Self-pay | Admitting: Pulmonary Disease

## 2012-03-14 ENCOUNTER — Ambulatory Visit (INDEPENDENT_AMBULATORY_CARE_PROVIDER_SITE_OTHER): Payer: BC Managed Care – PPO | Admitting: Pulmonary Disease

## 2012-03-14 VITALS — BP 118/74 | HR 73 | Temp 98.8°F | Ht 64.0 in | Wt 257.4 lb

## 2012-03-14 DIAGNOSIS — G4733 Obstructive sleep apnea (adult) (pediatric): Secondary | ICD-10-CM

## 2012-03-14 NOTE — Progress Notes (Signed)
Chief Complaint  Patient presents with  . Advice Only    Pt has not used CPAP x 2 years. Had sleep study over 4 years ago. Pt c/o falling sleep during the day when bored/even when at work, snoring, waking up with headaches. when she wakes up she feels lik eshe has had no rest at all.     History of Present Illness: Nancy Acevedo is a 52 y.o. female for evaluation of of sleep apnea.  She has a history of sleep apnea.  She had sleep study with Dr. Parke Simmers about 5 years ago.  She was diagnosed with sleep apnea, and told her sleep apnea number was over 100!  She was started on CPAP with nasal pillows.  She was doing well with this.  Her machine broke about 2 years ago, and she was not able to contact her DME.  As a result she has not been using CPAP for the past 2 years.  She continues to snore.  She stops breathing while asleep.  She works two jobs, and gets about 6 hours sleep per day.  She is tired all the time.  She does not use anything to help her sleep or stay awake.  She has gained weight since her original sleep study.  The patient denies sleep walking, sleep talking, bruxism, or nightmares.  There is no history of restless legs.  The patient denies sleep hallucinations, sleep paralysis, or cataplexy.  Her Epworth score is 18 out of 24.  Tests:   Shandrea Lusk  has a past medical history of Hypertension; Headaches, cluster; History of hyperglycemia (1993); Diabetes mellitus, type 2 (dx 01/2011); OSA (obstructive sleep apnea); and EP (ectopic pregnancy).  Tashyra Ladona Ridgel  has past surgical history that includes Tonsillectomy and adenoidectomy (2012) and Ectopic pregnancy surgery.  Prior to Admission medications   Medication Sig Start Date End Date Taking? Authorizing Provider  citalopram (CELEXA) 10 MG tablet Take 1 tablet (10 mg total) by mouth daily. 02/24/12  Yes Newt Lukes, MD  metFORMIN (GLUCOPHAGE-XR) 500 MG 24 hr tablet Take 1 tablet (500 mg total) by mouth daily with  breakfast. 02/27/12  Yes Newt Lukes, MD  metoprolol succinate (TOPROL-XL) 50 MG 24 hr tablet Take 1 tablet (50 mg total) by mouth daily. Take with or immediately following a meal. 02/24/12  Yes Newt Lukes, MD  Olmesartan-Amlodipine-HCTZ [facility]) 40-10-25 MG TABS Take 1 tablet by mouth daily. 11/15/11  Yes Newt Lukes, MD    No Known Allergies  family history includes Arthritis in her other; Diabetes in her mother; and Hypertension in her mother.   reports that she has never smoked. She does not have any smokeless tobacco history on file. She reports that she does not drink alcohol or use illicit drugs.  Review of Systems  Constitutional: Positive for unexpected weight change. Negative for appetite change.  HENT: Negative for ear pain, congestion, sore throat, sneezing, trouble swallowing and dental problem.   Respiratory: Positive for shortness of breath. Negative for cough.   Cardiovascular: Negative for chest pain, palpitations and leg swelling.  Gastrointestinal: Negative for abdominal pain.  Musculoskeletal: Negative for joint swelling.  Skin: Negative for rash.  Neurological: Positive for headaches.  Psychiatric/Behavioral: Negative for dysphoric mood. The patient is not nervous/anxious.    Physical Exam:  General - No distress ENT - No sinus tenderness, MP 4, no oral exudate, no LAN, no thyromegaly, TM clear, pupils equal/reactive Cardiac - s1s2 regular, no murmur, pulses symmetric Chest -  No wheeze/rales/dullness, good air entry, normal respiratory excursion Back - No focal tenderness Abd - Soft, non-tender, no organomegaly, + bowel sounds Ext - No edema Neuro - Normal strength, cranial nerves intact Skin - No rashes Psych - Normal mood, and behavior  Body mass index is 44.16 kg/(m^2).   Assessment/Plan:  Coralyn Helling, MD [facility] Pager:  [phone] 03/14/2012, 2:54 PM

## 2012-03-14 NOTE — Progress Notes (Deleted)
  Subjective:    Patient ID: Nancy Acevedo, female    DOB: [birth year], 52 y.o.   MRN: [id number]  HPI    Review of Systems  Constitutional: Positive for unexpected weight change. Negative for appetite change.  HENT: Negative for ear pain, congestion, sore throat, sneezing, trouble swallowing and dental problem.   Respiratory: Positive for shortness of breath. Negative for cough.   Cardiovascular: Negative for chest pain, palpitations and leg swelling.  Gastrointestinal: Negative for abdominal pain.  Musculoskeletal: Negative for joint swelling.  Skin: Negative for rash.  Neurological: Positive for headaches.  Psychiatric/Behavioral: Negative for dysphoric mood. The patient is not nervous/anxious.        Objective:   Physical Exam        Assessment & Plan:

## 2012-03-14 NOTE — Assessment & Plan Note (Addendum)
She has history of sleep apnea.  She has not been on therapy for several years.  She has history of HTN, DM, and depression.  She has gained weight since original sleep study several years ago.  Her BMI is > 35.  She continues to have snoring, witnessed apnea, sleep disruption, and daytime sleepiness.  I am concerned she still has sleep apnea.   I have explained how sleep apnea can affect the patient's health.  Driving precautions and importance of weight loss were discussed.  Treatment options for sleep apnea were reviewed.  To further assess will arrange for home sleep study.  Explained she may need in lab sleep study also.  Will likely be able to set up CPAP after home sleep study, and then follow up after this.

## 2012-03-14 NOTE — Patient Instructions (Signed)
Will arrange for home sleep study Will call to arrange for follow up after sleep study reviewed  

## 2012-03-28 ENCOUNTER — Ambulatory Visit (INDEPENDENT_AMBULATORY_CARE_PROVIDER_SITE_OTHER): Payer: BC Managed Care – PPO | Admitting: Pulmonary Disease

## 2012-03-28 DIAGNOSIS — G4733 Obstructive sleep apnea (adult) (pediatric): Secondary | ICD-10-CM

## 2012-03-30 ENCOUNTER — Telehealth: Payer: Self-pay | Admitting: Pulmonary Disease

## 2012-03-30 NOTE — Telephone Encounter (Signed)
HST 03/28/12 >> AHI 40.8, SpO2 low 62%  Will have my nurse schedule ROV to review results.

## 2012-04-05 NOTE — Telephone Encounter (Signed)
Pt is scheduled for 04/11/12 at 3:00. Nothing further was needed

## 2012-04-06 ENCOUNTER — Encounter: Payer: Self-pay | Admitting: Pulmonary Disease

## 2012-04-09 ENCOUNTER — Ambulatory Visit: Payer: BC Managed Care – PPO

## 2012-04-09 ENCOUNTER — Ambulatory Visit: Payer: BC Managed Care – PPO | Admitting: Internal Medicine

## 2012-04-09 DIAGNOSIS — Z0289 Encounter for other administrative examinations: Secondary | ICD-10-CM

## 2012-04-11 ENCOUNTER — Ambulatory Visit: Payer: BC Managed Care – PPO | Admitting: Pulmonary Disease

## 2012-04-18 ENCOUNTER — Ambulatory Visit (INDEPENDENT_AMBULATORY_CARE_PROVIDER_SITE_OTHER): Payer: BC Managed Care – PPO | Admitting: Internal Medicine

## 2012-04-18 ENCOUNTER — Encounter: Payer: Self-pay | Admitting: *Deleted

## 2012-04-18 ENCOUNTER — Encounter: Payer: Self-pay | Admitting: Internal Medicine

## 2012-04-18 VITALS — BP 120/78 | HR 82 | Temp 98.8°F | Resp 16 | Wt 256.2 lb

## 2012-04-18 DIAGNOSIS — E119 Type 2 diabetes mellitus without complications: Secondary | ICD-10-CM

## 2012-04-18 DIAGNOSIS — E669 Obesity, unspecified: Secondary | ICD-10-CM

## 2012-04-18 DIAGNOSIS — F329 Major depressive disorder, single episode, unspecified: Secondary | ICD-10-CM

## 2012-04-18 DIAGNOSIS — J9801 Acute bronchospasm: Secondary | ICD-10-CM

## 2012-04-18 MED ORDER — METHYLPREDNISOLONE ACETATE 80 MG/ML IJ SUSP
80.0000 mg | Freq: Once | INTRAMUSCULAR | Status: AC
  Administered 2012-04-18: 80 mg via INTRAMUSCULAR

## 2012-04-18 MED ORDER — HYDROCODONE-HOMATROPINE 5-1.5 MG/5ML PO SYRP: 5.0000 mL | ORAL_SOLUTION | Freq: Four times a day (QID) | ORAL | Status: DC | PRN

## 2012-04-18 NOTE — Assessment & Plan Note (Signed)
Wt Readings from Last 3 Encounters:  04/18/12 256 lb 4 oz (116.234 kg)  03/14/12 257 lb 6.4 oz (116.756 kg)  02/24/12 261 lb 6.4 oz (118.57 kg)   The patient is asked to make an attempt to improve diet and exercise patterns to aid in medical management of this problem. Prev unable to consider bariatric intervention due to cost, but requests new referral now Trial phentermine x 30d 08/2011 -

## 2012-04-18 NOTE — Progress Notes (Signed)
Subjective:    Patient ID: Nancy Acevedo, female    DOB: 1960-04-24, 52 y.o.   MRN: [id number]  Cough This is a new problem. The current episode started 1 to 4 weeks ago. The problem has been unchanged. The problem occurs every few minutes. The cough is non-productive. Associated symptoms include myalgias, shortness of breath and wheezing. Pertinent negatives include no chest pain, fever, hemoptysis, nasal congestion, postnasal drip, rhinorrhea, sore throat, sweats or weight loss. Nothing aggravates the symptoms. She has tried nothing for the symptoms. There is no history of asthma or environmental allergies.  Shortness of Breath Associated symptoms include wheezing. Pertinent negatives include no chest pain, fever, hemoptysis, leg swelling, rhinorrhea or sore throat. There is no history of asthma.   also reviewed chronic medical issues:  depression - started Wellbutrin 05/2010 - took for 4 weeks but stopped same due to "feeling weird and disconnected" ; similar effects with Paxil trial August 2013, stopped after 30 days- still poor sleep, irritability, short fuse, and hopeless - denies SI. Began SSRI citalopram 02/2012  DM2 - started metformin late 01/2011 for same - the patient reports variable compliance with medication(s) as prescribed. Denies adverse side effects.  Obesity - frustrated with inability to lose weight - walking and cooking at home instead of eating out - unable to afford weight watchers or bariatric surgery  HTN - the patient reports compliance with medication(s) as prescribed. Denies adverse side effects.  OSA - noncompliance with CPAP due to machine problems  Past Medical History  Diagnosis Date  . Hypertension   . Headaches, cluster   . History of hyperglycemia 1993  . Diabetes mellitus, type 2 dx 01/2011  . OSA (obstructive sleep apnea)   . EP (ectopic pregnancy)     Review of Systems  Constitutional: Positive for fatigue. Negative for fever and weight loss.   HENT: Negative for sore throat, rhinorrhea and postnasal drip.   Respiratory: Positive for cough, shortness of breath and wheezing. Negative for hemoptysis.   Cardiovascular: Negative for chest pain and leg swelling.  Musculoskeletal: Positive for myalgias.  Allergic/Immunologic: Negative for environmental allergies.        Objective:   Physical Exam  Pulse 82  Temp(Src) 98.8 F (37.1 C) (Oral)  Resp 16  Wt 256 lb 4 oz (116.234 kg)  BMI 43.96 kg/m2  SpO2 98% Wt Readings from Last 3 Encounters:  04/18/12 256 lb 4 oz (116.234 kg)  03/14/12 257 lb 6.4 oz (116.756 kg)  02/24/12 261 lb 6.4 oz (118.57 kg)   Constitutional: She is obese, but appears well-developed and well-nourished. No distress.  Neck: Normal range of motion. Neck supple. No JVD present. No thyromegaly present.  Cardiovascular: Normal rate, regular rhythm and normal heart sounds.  No murmur heard. No BLE edema. Pulmonary/Chest: Effort normal and breath sounds diminished at bases with slight end exp wheeze in R base - No respiratory distress.  Psychiatric: She has a normal mood and affect. Her behavior is normal. Judgment and thought content normal.   Lab Results  Component Value Date   WBC 5.3 03/08/2012   HGB 13.0 03/08/2012   HCT 37.8 03/08/2012   PLT 308 03/08/2012   GLUCOSE 107* 03/08/2012   CHOL 176 02/24/2012   TRIG 82.0 02/24/2012   HDL 42.10 02/24/2012   LDLCALC 118* 02/24/2012   ALT 80* 02/17/2011   AST 64* 02/17/2011   NA 138 03/08/2012   K 3.8 03/08/2012   CL 99 03/08/2012   CREATININE 1.14* 03/08/2012  BUN 19 03/08/2012   CO2 27 03/08/2012   TSH 0.98 02/17/2011   HGBA1C 6.2 02/24/2012      Assessment & Plan:   Cough - dry but ongoing  > 2 months - Bronchospasm (mild) on exam today - O2 normal -  No evidence for infection so we'll hold empiric antibiotic Medrol 80 IM today -tx Hydromet prn Work note for today and tomorrow encourage compliance with H2B and follow up pulmonary  Also see problem list.  Medications and labs reviewed today.

## 2012-04-18 NOTE — Assessment & Plan Note (Signed)
Hx pregnancy induced hyperglycemia 1993 but "not diabetes mellitus after 3 hour test -GTT" New dx 01/2011 - started metformin, but variable compliance with same s/p nutrition eval for same Again advised on need for healthy diet and exercise habits for weight reduction to control risk of DM dz Lab Results  Component Value Date   HGBA1C 6.2 02/24/2012   patient advised on anticipated increase in cbgs due to steroid effect for next several days - pt will call if cbg>200

## 2012-04-18 NOTE — Patient Instructions (Signed)
It was good to see you today. If you develop worsening symptoms or fever, call and we can reconsider antibiotics, but it does not appear necessary to use antibiotics at this time. Steroid shot given today for allergy, wheeze and cough control -Watch your sugars and call if over 200 in next few days - steroids will increase your sugars more than usual for next few days! Use Hydromet syrup for cough symptoms as needed Work excuse for today and tomorrow provided as requested We'll make referral to bariatric surgery for consultation as requested Medications reviewed, continue all as prescribed Keep followup as scheduled for diabetes and blood pressure recheck, call sooner if problems

## 2012-04-18 NOTE — Assessment & Plan Note (Signed)
Classic symptoms present > 6 mo Self tx with eating binge Intol of wellbutrin 06/2011 - self dc'd same Same intolerance of paxil trial 08/2011 x 30d Started citalopram 02/2012 - feels improved, says dtr has noted same  The current medical regimen is effective;  continue present plan and medications.

## 2012-05-07 ENCOUNTER — Encounter: Payer: Self-pay | Admitting: Pulmonary Disease

## 2012-05-07 ENCOUNTER — Ambulatory Visit (INDEPENDENT_AMBULATORY_CARE_PROVIDER_SITE_OTHER): Payer: BC Managed Care – PPO | Admitting: Pulmonary Disease

## 2012-05-07 VITALS — BP 118/76 | HR 83 | Temp 98.3°F | Ht 64.0 in | Wt 257.0 lb

## 2012-05-07 DIAGNOSIS — G4733 Obstructive sleep apnea (adult) (pediatric): Secondary | ICD-10-CM

## 2012-05-07 NOTE — Patient Instructions (Signed)
Will arrange for CPAP set up Will arrange for overnight oxygen test Follow up in 2 months

## 2012-05-07 NOTE — Progress Notes (Signed)
Chief Complaint  Patient presents with  . Results    Pt here to discuss sleep study results.     History of Present Illness: Nancy Acevedo is a 52 y.o. female with severe OSA.  She is here to review her home sleep study.  She is considering evaluation for gastric bypass.   TESTS: HST 03/28/12 >> AHI 40.8, SpO2 low 62%  Nancy Acevedo  has a past medical history of Hypertension; Headaches, cluster; History of hyperglycemia (1993); Diabetes mellitus, type 2 (dx 01/2011); OSA (obstructive sleep apnea); and EP (ectopic pregnancy).  Nancy Acevedo  has past surgical history that includes Tonsillectomy and adenoidectomy (2012) and Ectopic pregnancy surgery.  Prior to Admission medications   Medication Sig Start Date End Date Taking? Authorizing Provider  citalopram (CELEXA) 10 MG tablet Take 1 tablet (10 mg total) by mouth daily. 02/24/12  Yes Newt Lukes, MD  metFORMIN (GLUCOPHAGE-XR) 500 MG 24 hr tablet Take 1 tablet (500 mg total) by mouth daily with breakfast. 02/27/12  Yes Newt Lukes, MD  metoprolol succinate (TOPROL-XL) 50 MG 24 hr tablet Take 1 tablet (50 mg total) by mouth daily. Take with or immediately following a meal. 02/24/12  Yes Newt Lukes, MD  Olmesartan-Amlodipine-HCTZ [facility]) 40-10-25 MG TABS Take 1 tablet by mouth daily. 11/15/11  Yes Newt Lukes, MD    No Known Allergies   Physical Exam:  General - No distress ENT - No sinus tenderness, MP 4, no oral exudate, no LAN Cardiac - s1s2 regular, no murmur Chest - No wheeze/rales/dullness Back - No focal tenderness Abd - Soft, non-tender Ext - No edema Neuro - Normal strength Skin - No rashes Psych - normal mood, and behavior   Assessment/Plan:  Coralyn Helling, MD [facility] Pager:  [phone]

## 2012-05-07 NOTE — Assessment & Plan Note (Signed)
She has severe OSA.  I have reviewed the recent sleep study results with the patient.  We discussed how sleep apnea can affect various health problems including risks for hypertension, cardiovascular disease, and diabetes.  We also discussed how sleep disruption can increase risks for accident, such as while driving.  Weight loss as a means of improving sleep apnea was also reviewed.  Additional treatment options discussed were CPAP therapy, oral appliance, and surgical intervention.  Will arrange for auto CPAP set up.  Will arrange for ONO with CPAP and RA.

## 2012-06-07 ENCOUNTER — Telehealth: Payer: Self-pay | Admitting: Pulmonary Disease

## 2012-06-07 NOTE — Telephone Encounter (Signed)
ONO with CPAP and RA 05/21/12 >> test time 4 hrs 18 min.  Mean SpO2 94%, low SpO2 90%.  Will have my nurse inform pt that ONO with CPAP was normal.  She is to continue CPAP while asleep.

## 2012-06-07 NOTE — Telephone Encounter (Signed)
lmomtcb x1 for pt 

## 2012-06-08 NOTE — Telephone Encounter (Signed)
lmomtcb x2 for pt 

## 2012-06-14 NOTE — Telephone Encounter (Signed)
lmomtcb x 3  

## 2012-06-15 ENCOUNTER — Encounter: Payer: Self-pay | Admitting: *Deleted

## 2012-06-15 NOTE — Telephone Encounter (Signed)
lmomtcb x4 will send letter out to pt

## 2012-06-25 ENCOUNTER — Encounter: Payer: Self-pay | Admitting: Pulmonary Disease

## 2012-06-29 ENCOUNTER — Encounter: Payer: Self-pay | Admitting: Internal Medicine

## 2012-06-29 ENCOUNTER — Ambulatory Visit (INDEPENDENT_AMBULATORY_CARE_PROVIDER_SITE_OTHER): Payer: BC Managed Care – PPO | Admitting: Internal Medicine

## 2012-06-29 ENCOUNTER — Ambulatory Visit (INDEPENDENT_AMBULATORY_CARE_PROVIDER_SITE_OTHER)
Admission: RE | Admit: 2012-06-29 | Discharge: 2012-06-29 | Disposition: A | Discharge status: Home / Self Care | Payer: BC Managed Care – PPO | Source: Ambulatory Visit | Attending: Internal Medicine | Admitting: Internal Medicine

## 2012-06-29 VITALS — BP 122/76 | HR 77 | Temp 98.2°F | Ht 64.0 in | Wt 252.0 lb

## 2012-06-29 DIAGNOSIS — R0989 Other specified symptoms and signs involving the circulatory and respiratory systems: Secondary | ICD-10-CM

## 2012-06-29 DIAGNOSIS — R1310 Dysphagia, unspecified: Secondary | ICD-10-CM

## 2012-06-29 DIAGNOSIS — F458 Other somatoform disorders: Secondary | ICD-10-CM

## 2012-06-29 DIAGNOSIS — B370 Candidal stomatitis: Secondary | ICD-10-CM

## 2012-06-29 MED ORDER — NYSTATIN 100000 UNIT/ML MT SUSP: 500000.0000 [IU] | Freq: Four times a day (QID) | OROMUCOSAL | Status: DC

## 2012-06-29 NOTE — Patient Instructions (Signed)
Globus Syndrome  Globus Syndrome is a feeling of a lump or a sensation of something caught in your throat. Eating food or drinking fluids does not seem to get rid of it. Yet it is not noticeable during the actual act of swallowing food or liquids. Usually there is nothing physically wrong. It is troublesome because it is an unpleasant sensation which is sometimes difficult to ignore and at times may seem to worsen. The syndrome is quite common. It is estimated 45% of the population experiences features of the condition at some stage during their lives. The symptoms are usually temporary. The largest group of people who feel the need to seek medical treatment is females between the ages of 30 to 60.   CAUSES   Globus Syndrome appears to be triggered by or aggravated by stress, anxiety and depression.  · Tension related to stress could product abnormal muscle spasms in the esophagus which would account for the sensation of a lump or ball in your throat.  · Frequent swallowing or drying of the throat caused by anxiety or other strong emotions can also produce this uncomfortable sensation in your throat.  · Fear and sadness can be expressed by the body in many ways. For instance, if you had a relative with throat cancer you might become overly concerned about your own health and develop uncomfortable sensations in your throat.  · The reaction to a crisis or a trauma event in your life can take the form of a lump in your throat. It is as if you are indirectly saying you can not handle or "swallow" one more thing.  DIAGNOSIS   Usually your caregiver will know what is wrong by talking to you and examining you.  If the condition persists for several days, more testing may be done to make sure there is not another problem present. This is usually not the case.  TREATMENT   · Reassurance is often the best treatment available. Usually the problem leaves without treatment over several days.  · Sometimes anti-anxiety medications  may be prescribed.  · Counseling or talk therapy can also help with strong underlying emotions.  · Note that in most cases this is not something that keeps coming back and you should not be concerned or worried.  Document Released: 03/26/2003 Document Revised: 03/28/2011 Document Reviewed: 08/23/2007  Exit[employer]® Patient Information ©2014 [employer].

## 2012-06-29 NOTE — Progress Notes (Signed)
Subjective:    Patient ID: Nancy Acevedo, female    DOB: 08/20/1960, 52 y.o.   MRN: [id number]  HPI  Pt presents to the clinic today with c/o feeling like "I have had something stuck in my throat for 2 weeks". She has not had any food with small bones such as fish or chicken. She has not eating any hard chips or anything that would have torn her esophagus. She has not eaten or drank anything hot that would scold her throat. She denies nausea or vomiting. She denies difficulty breathing. She does report pain with swallowing. She has not taken anything for this. She has never had this problem before.   Review of Systems      Past Medical History  Diagnosis Date  . Hypertension   . Headaches, cluster   . History of hyperglycemia 1993  . Diabetes mellitus, type 2 dx 01/2011  . OSA (obstructive sleep apnea)   . EP (ectopic pregnancy)     Current Outpatient Prescriptions  Medication Sig Dispense Refill  . citalopram (CELEXA) 10 MG tablet Take 1 tablet (10 mg total) by mouth daily.  30 tablet  5  . metFORMIN (GLUCOPHAGE-XR) 500 MG 24 hr tablet Take 1 tablet (500 mg total) by mouth daily with breakfast.  30 tablet  5  . metoprolol succinate (TOPROL-XL) 50 MG 24 hr tablet Take 1 tablet (50 mg total) by mouth daily. Take with or immediately following a meal.  90 tablet  3  . Olmesartan-Amlodipine-HCTZ (TRIBENZOR) 40-10-25 MG TABS Take 1 tablet by mouth daily.  30 tablet  6   No current facility-administered medications for this visit.    No Known Allergies  Family History  Problem Relation Age of Onset  . Arthritis Other   . Diabetes Mother   . Hypertension Mother     History   Social History  . Marital Status: Married    Spouse Name: N/A    Number of Children: 2  . Years of Education: N/A   Occupational History  . tech    Social History Main Topics  . Smoking status: Never Smoker   . Smokeless tobacco: Not on file  . Alcohol Use: No  . Drug Use: No  . Sexually  Active: Not on file   Other Topics Concern  . Not on file   Social History Narrative   Administration at university     Constitutional: Denies fever, malaise, fatigue, headache or abrupt weight changes.  HEENT: Denies eye pain, eye redness, ear pain, ringing in the ears, wax buildup, runny nose, nasal congestion, bloody nose, or sore throat. Respiratory: Denies difficulty breathing, shortness of breath, cough or sputum production.   Cardiovascular: Denies chest pain, chest tightness, palpitations or swelling in the hands or feet.  Gastrointestinal: Pt reports difficulty swallowing. Denies abdominal pain, bloating, constipation, diarrhea or blood in the stool.    No other specific complaints in a complete review of systems (except as listed in HPI above).  Objective:   Physical Exam   BP 122/76  Pulse 77  Temp(Src) 98.2 F (36.8 C) (Oral)  Ht 5\' 4"  (1.626 m)  Wt 252 lb (114.306 kg)  BMI 43.23 kg/m2  SpO2 99% Wt Readings from Last 3 Encounters:  06/29/12 252 lb (114.306 kg)  05/07/12 257 lb (116.574 kg)  04/18/12 256 lb 4 oz (116.234 kg)    General: Appears her stated age, well developed, well nourished in NAD. Skin: Warm, dry and intact. No rashes, lesions or ulcerations  noted. HEENT: Head: normal shape and size; Eyes: sclera white, no icterus, conjunctiva pink, PERRLA and EOMs intact; Ears: Tm's gray and intact, normal light reflex, tubes noted in ears; Nose: mucosa pink and moist, septum midline; Throat/Mouth: Teeth present, mucosa pink and moist, no exudate, lesions or ulcerations noted, thrush noted on tongue.  Neck: Normal range of motion. Neck supple, trachea midline. No massses, lumps or thyromegaly present.  Cardiovascular: Normal rate and rhythm. S1,S2 noted.  No murmur, rubs or gallops noted. No JVD or BLE edema. No carotid bruits noted. Pulmonary/Chest: Normal effort and positive vesicular breath sounds. No respiratory distress. No wheezes, rales or ronchi noted.   Abdomen: Soft and nontender. Normal bowel sounds, no bruits noted. No distention or masses noted. Liver, spleen and kidneys non palpable.     Assessment & Plan:   Globus sensation with difficulty swallowing, new onset with additional workup required:  Will obtain xray of soft tissue neck to r/o foreign body May need referral to GI for EGD  Thrush on tongue:  eRx for Nystatin

## 2012-06-30 NOTE — Progress Notes (Signed)
  Subjective:    Patient ID: Nancy Acevedo, female    DOB: [birth year], 52 y.o.   MRN: [id number]  HPI  Erroneous encounter  Review of Systems     Objective:   Physical Exam        Assessment & Plan:

## 2012-07-02 ENCOUNTER — Encounter: Payer: Self-pay | Admitting: Gastroenterology

## 2012-07-05 ENCOUNTER — Encounter: Payer: Self-pay | Admitting: *Deleted

## 2012-07-10 ENCOUNTER — Ambulatory Visit: Payer: BC Managed Care – PPO | Admitting: Pulmonary Disease

## 2012-07-10 ENCOUNTER — Ambulatory Visit: Payer: BC Managed Care – PPO | Admitting: Gastroenterology

## 2012-07-17 ENCOUNTER — Telehealth: Payer: Self-pay | Admitting: *Deleted

## 2012-07-17 DIAGNOSIS — I10 Essential (primary) hypertension: Secondary | ICD-10-CM

## 2012-07-17 DIAGNOSIS — E119 Type 2 diabetes mellitus without complications: Secondary | ICD-10-CM

## 2012-07-17 DIAGNOSIS — E669 Obesity, unspecified: Secondary | ICD-10-CM

## 2012-07-17 NOTE — Telephone Encounter (Signed)
Pt called requesting a referral to the nutritionist.  Please advise

## 2012-07-17 NOTE — Telephone Encounter (Signed)
Referral to nutritionist ordered

## 2012-07-18 NOTE — Telephone Encounter (Signed)
Spoke with pt.  Advised her referral to nutrition has been completed.

## 2012-08-02 ENCOUNTER — Ambulatory Visit: Payer: BC Managed Care – PPO | Admitting: Pulmonary Disease

## 2012-08-03 ENCOUNTER — Encounter: Payer: Self-pay | Admitting: Pulmonary Disease

## 2012-08-06 ENCOUNTER — Other Ambulatory Visit (INDEPENDENT_AMBULATORY_CARE_PROVIDER_SITE_OTHER): Payer: BC Managed Care – PPO

## 2012-08-06 ENCOUNTER — Ambulatory Visit (INDEPENDENT_AMBULATORY_CARE_PROVIDER_SITE_OTHER): Payer: BC Managed Care – PPO | Admitting: Internal Medicine

## 2012-08-06 ENCOUNTER — Encounter: Payer: Self-pay | Admitting: Internal Medicine

## 2012-08-06 ENCOUNTER — Encounter: Payer: Self-pay | Admitting: *Deleted

## 2012-08-06 VITALS — BP 138/98 | HR 78 | Temp 97.9°F | Wt 251.8 lb

## 2012-08-06 DIAGNOSIS — R238 Other skin changes: Secondary | ICD-10-CM

## 2012-08-06 DIAGNOSIS — E119 Type 2 diabetes mellitus without complications: Secondary | ICD-10-CM

## 2012-08-06 DIAGNOSIS — M629 Disorder of muscle, unspecified: Secondary | ICD-10-CM

## 2012-08-06 DIAGNOSIS — M7632 Iliotibial band syndrome, left leg: Secondary | ICD-10-CM

## 2012-08-06 DIAGNOSIS — R7989 Other specified abnormal findings of blood chemistry: Secondary | ICD-10-CM

## 2012-08-06 DIAGNOSIS — R239 Unspecified skin changes: Secondary | ICD-10-CM

## 2012-08-06 DIAGNOSIS — M2420 Disorder of ligament, unspecified site: Secondary | ICD-10-CM

## 2012-08-06 DIAGNOSIS — R945 Abnormal results of liver function studies: Secondary | ICD-10-CM

## 2012-08-06 LAB — HEMOGLOBIN A1C: Hgb A1c MFr Bld: 6.2 % (ref 4.6–6.5)

## 2012-08-06 LAB — HEPATIC FUNCTION PANEL
Albumin: 3.9 g/dL (ref 3.5–5.2)
Alkaline Phosphatase: 42 U/L (ref 39–117)

## 2012-08-06 MED ORDER — DICLOFENAC SODIUM 75 MG PO TBEC: 75.0000 mg | DELAYED_RELEASE_TABLET | Freq: Two times a day (BID) | ORAL | Status: DC

## 2012-08-06 NOTE — Patient Instructions (Signed)
It was good to see you today We have reviewed your prior records including labs and tests today Test(s) ordered today. Your results will be released to MyChart (or called to you) after review, usually within 72hours after test completion. If any changes need to be made, you will be notified at that same time. we'll make referral for ultrasound to evaluate liver. Also to dermatology specialist for your neck spots. Our office will contact you regarding appointment(s) once made. Medications reviewed and updated, use diclofenac 2x/day with food x 10days, then as needed for leg pain - no other changes recommended at this time. Your prescription(s) have been submitted to your pharmacy. Please take as directed and contact our office if you believe you are having problem(s) with the medication(s). Please schedule followup in 3-4 months for DM and weight check, call sooner if problems.  Iliotibial Band Syndrome Iliotibial band syndrome is pain in the outer, upper thigh. The pain is due to an inflammation (soreness) of the iliotibial band. This is a band of thick fibrous tissue that runs down the outside of the thigh. The iliotibial band begins at the hip. It extends to the outer side of the shin bone (tibia) just below the knee joint. The band works with the thigh muscles. Together they provide stability to the outside of the knee joint. Iliotibial band syndrome (ITBS) occurs when there is inflammation to this band of tissue. The irritation usually occurs over the outside of the knee joint, at the lateral epicondyle (the end of the femur bone.) The iliotibial band crosses bone and muscle at this point. Between these structures is a bursa (a cushioning sac). The bursa should make possible a smooth gliding motion. However, when inflamed, the iliotibial band does not glide easily. When inflamed, there is pain with motion of the knee. Usually the pain worsens with continued movement and the pain goes away with  rest. This problem usually arises when there is a sudden increase in sports activities involving the lower extremities (your legs). Running, and playing soccer or basketball are examples of activities causing this. Others who are prone to ITBS include individuals with mechanical problems such as leg length differences, abnormality of walking, bowed legs etc. This diagnosis (learning what is wrong) is made by examination. X-rays are usually normal if only soft tissue inflammation is present. Treatment of ITBS begins with proper footwear, icing the area of pain, stretching, and resting for a period of time. Incorporating low-impact cross-training activities may also help. Your caregiver may prescribe anti-inflammatory medications as well. HOME CARE INSTRUCTIONS   Apply ice to the sore area for 15-20 minutes, 3-4 times per day. Put the ice in a plastic bag and place a towel between the bag of ice and your skin.  Limit excessive training or eliminate training until pain goes away.  While pain is present, you may use gentle range of motion. Do not resume regular use until instructed by your caregiver. Begin use gradually. Do not increase use to the point of pain. If pain does develop, decrease use and continue the above measures. Gradually increase activities that do not cause discomfort. Do this until you finally achieve normal use.  Perform low impact activities while pain is present. Wear proper footwear.  Only take over-the-counter or prescription medicines for pain, discomfort, or fever as directed by your caregiver. SEEK MEDICAL CARE IF:   Your pain increases or pain is not controlled with medications.  You develop new, unexplained symptoms (problems), or an increase  of the symptoms that brought you to your caregiver.  Your pain and symptoms are not improving or are getting worse. Document Released: 06/25/2001 Document Revised: 03/28/2011 Document Reviewed: 01/03/2005 [facility] Patient  Information 2014 [place], [place].

## 2012-08-06 NOTE — Progress Notes (Signed)
  Subjective:    Patient ID: Nancy Acevedo, female    DOB: 11-Aug-1960, 52 y.o.   MRN: [id number]  HPI  See CC and PA student note  Past Medical History  Diagnosis Date  . Hypertension   . Headaches, cluster   . History of hyperglycemia 1993  . Diabetes mellitus, type 2 dx 01/2011  . OSA (obstructive sleep apnea)   . EP (ectopic pregnancy)   . Candidiasis of mouth     Review of Systems  Constitutional: Negative for fever, fatigue and unexpected weight change.  Respiratory: Negative for cough and shortness of breath.   Gastrointestinal: Negative for nausea and abdominal pain.  Musculoskeletal: Negative for myalgias, joint swelling and gait problem.       Objective:   Physical Exam  BP 138/98  Pulse 78  Temp(Src) 97.9 F (36.6 C) (Oral)  Wt 251 lb 12.8 oz (114.216 kg)  BMI 43.2 kg/m2  SpO2 97% Wt Readings from Last 3 Encounters:  08/06/12 251 lb 12.8 oz (114.216 kg)  06/29/12 252 lb (114.306 kg)  05/07/12 257 lb (116.574 kg)   Constitutional: She is obese, but appears well-developed and well-nourished. No distress.  Neck: Normal range of motion. Neck supple. No JVD present. No thyromegaly present.  Cardiovascular: Normal rate, regular rhythm and normal heart sounds.  No murmur heard. No BLE edema. Pulmonary/Chest: Effort normal and breath sounds normal. No respiratory distress. She has no wheezes.   MSkel: no groin pain, pain to palpation over lateral IT band/buttock. FROM hip and lumbar spine. Good strength B hip extensors and flexors but pain with external stretch of piriformis Skin: Skin tags at neck B. Skin is warm and dry. No rash noted. No erythema.  Psychiatric: She has a normal mood and affect. Her behavior is normal. Judgment and thought content normal.   Lab Results  Component Value Date   WBC 5.3 03/08/2012   HGB 13.0 03/08/2012   HCT 37.8 03/08/2012   PLT 308 03/08/2012   GLUCOSE 107* 03/08/2012   CHOL 176 02/24/2012   TRIG 82.0 02/24/2012   HDL 42.10  02/24/2012   LDLCALC 118* 02/24/2012   ALT 80* 02/17/2011   AST 64* 02/17/2011   NA 138 03/08/2012   K 3.8 03/08/2012   CL 99 03/08/2012   CREATININE 1.14* 03/08/2012   BUN 19 03/08/2012   CO2 27 03/08/2012   TSH 0.98 02/17/2011   HGBA1C 6.2 02/24/2012       Assessment & Plan:   See problem list. Medications and labs reviewed today.  Skin changes - skin tags on neck - refer to derm per pt request  L lateral hip pain - consistent with IT band syndrome - home PT exercises provided - NSAIDs - reassurance

## 2012-08-06 NOTE — Progress Notes (Signed)
Subjective:    Patient ID: Nancy Acevedo, female    DOB: 1960-04-20, 52 y.o.   MRN: [id number]  HPI Patient presents today with complaints of multiple skin tags on her neck that have recently become irritated and sore after they get caught in her jewelry, clothing and hair. She would like to have them removed at this time. She also has been having pain in her left posterior-lateral hip. This pain started 2 weeks ago and is aching in nature. It is worst, around 9/10 on the pain scale, when she wakes up in the morning. It eases off as the day progresses, but it is still painful, at 5-6/10, when she walks. She states that her left hip gave out yesterday. She denies any injury to that area and denies pain when laying on her left side. She has not tried anything to relieve the pain.   Past Medical History  Diagnosis Date  . Hypertension   . Headaches, cluster   . History of hyperglycemia 1993  . Diabetes mellitus, type 2 dx 01/2011  . OSA (obstructive sleep apnea)   . EP (ectopic pregnancy)   . Candidiasis of mouth      Review of Systems  Constitutional: Negative for fatigue and unexpected weight change.  Musculoskeletal: Negative for back pain and joint swelling.  Skin: Negative for color change and rash.        Objective:   Physical Exam  Constitutional: She is oriented to person, place, and time. She appears well-developed and well-nourished.  Neck: Neck supple. No tracheal deviation present. No thyromegaly present.  Cardiovascular: Normal rate and regular rhythm.  Exam reveals no friction rub.   No murmur heard. Pulmonary/Chest: Breath sounds normal. She has no wheezes. She has no rales.  Musculoskeletal: She exhibits no edema.  Hip and knee strength 5/5 bilaterally Pain in left posterior lateral hip elicited by left knee and left hip extension and external rotation of left hip.  There is mild tenderness to palpation of left lateral greater trochanter.  There is no tenderness on  palpation of vertebral spine.   Lymphadenopathy:    She has no cervical adenopathy.  Neurological: She is alert and oriented to person, place, and time.  Skin: Skin is warm and dry. No rash noted. No erythema.  4-5 skin tags all <1cm are noted on anterior surface of neck  Psychiatric: She has a normal mood and affect.   BP 138/98  Pulse 78  Temp(Src) 97.9 F (36.6 C) (Oral)  Wt 251 lb 12.8 oz (114.216 kg)  BMI 43.2 kg/m2  SpO2 97%  Lab Results  Component Value Date   WBC 5.3 03/08/2012   HGB 13.0 03/08/2012   HCT 37.8 03/08/2012   PLT 308 03/08/2012   GLUCOSE 107* 03/08/2012   CHOL 176 02/24/2012   TRIG 82.0 02/24/2012   HDL 42.10 02/24/2012   LDLCALC 118* 02/24/2012   ALT 80* 02/17/2011   AST 64* 02/17/2011   NA 138 03/08/2012   K 3.8 03/08/2012   CL 99 03/08/2012   CREATININE 1.14* 03/08/2012   BUN 19 03/08/2012   CO2 27 03/08/2012   TSH 0.98 02/17/2011   HGBA1C 6.2 02/24/2012       Assessment & Plan:  1. Skin changes- referral to dermatologist for evaluation and removal of multiple skin tags on neck that have been irritated by jewelry and clothing   2. IT band syndrome- Patient was given a set of stretches for the IT band. She was prescribed voltaren  daily to help with inflammation and pain. She should follow up if pain worsens or does not improve over the next few weeks.   3. Elevated LFT's- Patient had elevated LFTs detected in 2013 for which she was ordered an abdominal US that she never scheduled. Today we will recheck LFTs and plan for an abdominal US if they are still elevated.  Concha Se, [place] [facility]  I have personally reviewed this case with PA student. I also personally examined this patient. I agree with history and findings as documented above. I reviewed, discussed and approve of the assessment and plan as listed above. Rene Paci, MD

## 2012-08-06 NOTE — Assessment & Plan Note (Signed)
Incidental mild increase on CPX labs 2012 - ?gatty liver or other Did not follow up for Abd [place] as referred 01/2011 Reorder now and recheck LFTs - no symptomatic GI dz on hx or exam

## 2012-08-06 NOTE — Assessment & Plan Note (Signed)
Hx pregnancy induced hyperglycemia 1993 but "not diabetes mellitus after 3 hour test -GTT" New dx 01/2011 - started metformin, but variable compliance with same s/p nutrition eval for same Recheck a1c now and titrate meds as needed Again advised on need for healthy diet and exercise habits for weight reduction to control risk of DM dz Lab Results  Component Value Date   HGBA1C 6.2 02/24/2012

## 2012-08-08 ENCOUNTER — Encounter: Payer: Self-pay | Admitting: *Deleted

## 2012-08-10 ENCOUNTER — Encounter: Payer: Self-pay | Admitting: Internal Medicine

## 2012-08-20 ENCOUNTER — Telehealth: Payer: Self-pay | Admitting: *Deleted

## 2012-08-20 MED ORDER — OLMESARTAN-AMLODIPINE-HCTZ 40-10-25 MG PO TABS: 1.0000 | ORAL_TABLET | Freq: Every day | ORAL | Status: DC

## 2012-08-20 MED ORDER — METOPROLOL SUCCINATE ER 50 MG PO TB24: 50.0000 mg | ORAL_TABLET | Freq: Every day | ORAL | Status: DC

## 2012-08-20 MED ORDER — TRAMADOL HCL 50 MG PO TABS: 50.0000 mg | ORAL_TABLET | Freq: Three times a day (TID) | ORAL | Status: DC | PRN

## 2012-08-20 NOTE — Telephone Encounter (Signed)
Ok for tramadol 50 mg  TID prn # 60 no refills

## 2012-08-20 NOTE — Telephone Encounter (Signed)
Called cell number unable to leave msg. And no answer.  Prescription has been printed and signed.  Will call back to speak to the patient to inform and inquire pharmacy to fax to.

## 2012-08-20 NOTE — Telephone Encounter (Signed)
Pt called requesting pain medication for her leg pain,  Please advise.

## 2012-08-20 NOTE — Telephone Encounter (Signed)
Printed prescription as informed per NP.

## 2012-08-30 ENCOUNTER — Ambulatory Visit: Payer: BC Managed Care – PPO

## 2012-09-13 ENCOUNTER — Encounter: Payer: Self-pay | Admitting: *Deleted

## 2012-09-13 ENCOUNTER — Encounter: Payer: BC Managed Care – PPO | Attending: Internal Medicine

## 2012-09-13 ENCOUNTER — Ambulatory Visit: Payer: BC Managed Care – PPO | Admitting: Internal Medicine

## 2012-09-13 VITALS — Ht 64.0 in | Wt 246.2 lb

## 2012-09-13 DIAGNOSIS — E119 Type 2 diabetes mellitus without complications: Secondary | ICD-10-CM | POA: Insufficient documentation

## 2012-09-13 DIAGNOSIS — Z713 Dietary counseling and surveillance: Secondary | ICD-10-CM | POA: Insufficient documentation

## 2012-09-13 NOTE — Patient Instructions (Signed)
Goals:  Follow Diabetes Meal Plan as instructed  Eat 3 meals and 2 snacks, every 3-5 hrs  Limit carbohydrate intake to 30-45 grams carbohydrate/meal  Limit carbohydrate intake to 15 grams carbohydrate/snack  Add lean protein foods to meals/snacks  Monitor glucose levels as instructed by your doctor  Aim for 30 mins of physical activity daily  Bring food record and glucose log to your next nutrition visit 

## 2012-09-13 NOTE — Progress Notes (Signed)
Patient was seen on 09/13/12 for the first of a series of three diabetes self-management courses at [facility].   Current HbA1c: 6.2%  The following learning objectives were met by the patient during this course:   Defines the role of glucose and insulin  Identifies type of diabetes and pathophysiology  Defines the diagnostic criteria for diabetes and prediabetes  States the risk factors for Type 2 Diabetes  States the symptoms of Type 2 Diabetes  Defines Type 2 Diabetes treatment goals  Defines Type 2 Diabetes treatment options  States the rationale for glucose monitoring  Identifies A1C, glucose targets, and testing times  Identifies proper sharps disposal  Defines the purpose of a diabetes food plan  Identifies carbohydrate food groups  Defines effects of carbohydrate foods on glucose levels  Identifies carbohydrate choices/grams/food labels  States benefits of physical activity and effect on glucose  Review of suggested activity guidelines  Handouts given during class include:  Type 2 Diabetes: Basics Book  My Food Plan Book  Food and Activity Log  Your patient has identified their diabetes self-care support plan as:  [facility] support group  Follow-Up Plan: Attend core 2 and core 3

## 2012-10-26 ENCOUNTER — Encounter: Payer: Self-pay | Admitting: Internal Medicine

## 2012-11-13 ENCOUNTER — Other Ambulatory Visit (INDEPENDENT_AMBULATORY_CARE_PROVIDER_SITE_OTHER): Payer: BC Managed Care – PPO

## 2012-11-13 ENCOUNTER — Ambulatory Visit (INDEPENDENT_AMBULATORY_CARE_PROVIDER_SITE_OTHER): Payer: BC Managed Care – PPO | Admitting: Internal Medicine

## 2012-11-13 ENCOUNTER — Encounter: Payer: Self-pay | Admitting: Internal Medicine

## 2012-11-13 VITALS — BP 150/100 | HR 83 | Temp 98.2°F | Ht 64.0 in | Wt 239.6 lb

## 2012-11-13 DIAGNOSIS — I10 Essential (primary) hypertension: Secondary | ICD-10-CM

## 2012-11-13 LAB — BASIC METABOLIC PANEL
GFR: 88.28 mL/min (ref >60.00)
Glucose, Bld: 101 mg/dL — ABNORMAL HIGH (ref 70–99)
Potassium: 4 mEq/L (ref 3.5–5.1)
Sodium: 140 mEq/L (ref 135–145)

## 2012-11-13 LAB — POTASSIUM: Potassium: 4 mEq/L (ref 3.5–5.1)

## 2012-11-13 MED ORDER — SPIRONOLACTONE 25 MG PO TABS: 25.0000 mg | ORAL_TABLET | Freq: Every day | ORAL | Status: DC

## 2012-11-13 NOTE — Progress Notes (Signed)
Subjective:    Patient ID: Nancy Acevedo, female    DOB: October 21, 1960, 52 y.o.   MRN: [id number]  HPI  Pt presents to the clinic today with c/o headaches, dizziness and elevated blood pressure. She does have a history of resistant hypertension. It does not look like she has had any imaging of her renal arteries. She denies chest pain, chest tightness or shortness of breath but has felt fatigued. She is taking both of her blood pressure medications religiously. She is not consuming a lot of salt. She has lost 30 lb over the last 2 years.  Review of Systems      Past Medical History  Diagnosis Date  . Hypertension   . Headaches, cluster   . History of hyperglycemia 1993  . Diabetes mellitus, type 2 dx 01/2011  . OSA (obstructive sleep apnea)   . EP (ectopic pregnancy)   . Candidiasis of mouth     Current Outpatient Prescriptions  Medication Sig Dispense Refill  . metFORMIN (GLUCOPHAGE-XR) 500 MG 24 hr tablet Take 1 tablet (500 mg total) by mouth daily with breakfast.  30 tablet  5  . metoprolol succinate (TOPROL-XL) 50 MG 24 hr tablet Take 1 tablet (50 mg total) by mouth daily. Take with or immediately following a meal.  90 tablet  3  . Olmesartan-Amlodipine-HCTZ (TRIBENZOR) 40-10-25 MG TABS Take 1 tablet by mouth daily.  30 tablet  6   No current facility-administered medications for this visit.    No Known Allergies  Family History  Problem Relation Age of Onset  . Arthritis Other   . Diabetes Mother   . Hypertension Mother     History   Social History  . Marital Status: Married    Spouse Name: N/A    Number of Children: 2  . Years of Education: N/A   Occupational History  . tech    Social History Main Topics  . Smoking status: Never Smoker   . Smokeless tobacco: Never Used  . Alcohol Use: No  . Drug Use: No  . Sexual Activity: Not on file   Other Topics Concern  . Not on file   Social History Narrative   Administration at university      Constitutional: Pt reports headache and fatigue. Denies fever, malaise,or abrupt weight changes.  HEENT: Denies eye pain, eye redness, ear pain, ringing in the ears, wax buildup, runny nose, nasal congestion, bloody nose, or sore throat. Respiratory: Denies difficulty breathing, shortness of breath, cough or sputum production.   Cardiovascular: Denies chest pain, chest tightness, palpitations or swelling in the hands or feet.   Neurological: Pt reports dizziness. Denies dizziness, difficulty with memory, difficulty with speech or problems with balance and coordination.   No other specific complaints in a complete review of systems (except as listed in HPI above).  Objective:   Physical Exam   BP 150/100  Pulse 83  Temp(Src) 98.2 F (36.8 C) (Oral)  Ht 5\' 4"  (1.626 m)  Wt 239 lb 9.6 oz (108.682 kg)  BMI 41.11 kg/m2  SpO2 98% Wt Readings from Last 3 Encounters:  11/13/12 239 lb 9.6 oz (108.682 kg)  09/13/12 246 lb 3.2 oz (111.676 kg)  08/06/12 251 lb 12.8 oz (114.216 kg)    General: Appears her stated age, well developed, well nourished in NAD.  Cardiovascular: Normal rate and rhythm. S1,S2 noted.  No murmur, rubs or gallops noted. No JVD or BLE edema. No carotid bruits noted. Pulmonary/Chest: Normal effort and positive vesicular breath  sounds. No respiratory distress. No wheezes, rales or ronchi noted.  Neurological: Alert and oriented. Cranial nerves II-XII intact. Coordination normal. +DTRs bilaterally.   BMET    Component Value Date/Time   NA 138 03/08/2012 1613   K 3.8 03/08/2012 1613   CL 99 03/08/2012 1613   CO2 27 03/08/2012 1613   GLUCOSE 107* 03/08/2012 1613   BUN 19 03/08/2012 1613   CREATININE 1.14* 03/08/2012 1613   CALCIUM 9.5 03/08/2012 1613   GFRNONAA 55* 03/08/2012 1613   GFRAA 64* 03/08/2012 1613    Lipid Panel     Component Value Date/Time   CHOL 176 02/24/2012 1028   TRIG 82.0 02/24/2012 1028   HDL 42.10 02/24/2012 1028   CHOLHDL 4 02/24/2012 1028   VLDL  16.4 02/24/2012 1028   LDLCALC 118* 02/24/2012 1028    CBC    Component Value Date/Time   WBC 5.3 03/08/2012 1613   RBC 4.29 03/08/2012 1613   HGB 13.0 03/08/2012 1613   HCT 37.8 03/08/2012 1613   PLT 308 03/08/2012 1613   MCV 88.1 03/08/2012 1613   MCH 30.3 03/08/2012 1613   MCHC 34.4 03/08/2012 1613   RDW 13.2 03/08/2012 1613   LYMPHSABS 2.4 03/08/2012 1613   MONOABS 0.5 03/08/2012 1613   EOSABS 0.1 03/08/2012 1613   BASOSABS 0.0 03/08/2012 1613    Hgb A1C Lab Results  Component Value Date   HGBA1C 6.2 08/06/2012        Assessment & Plan:

## 2012-11-13 NOTE — Patient Instructions (Signed)

## 2012-11-13 NOTE — Assessment & Plan Note (Signed)
Resistant Will add aldactone 25 mg daily per Uptodate Bring in a log of your blood pressures each day-varying times Ultrasound to assess for RAS BMET today Will check potassium in 2 weeks

## 2012-11-20 ENCOUNTER — Encounter: Payer: Self-pay | Admitting: Internal Medicine

## 2012-11-20 ENCOUNTER — Ambulatory Visit (INDEPENDENT_AMBULATORY_CARE_PROVIDER_SITE_OTHER): Payer: BC Managed Care – PPO | Admitting: Internal Medicine

## 2012-11-20 VITALS — BP 158/110 | Temp 98.7°F | Wt 239.0 lb

## 2012-11-20 DIAGNOSIS — E119 Type 2 diabetes mellitus without complications: Secondary | ICD-10-CM

## 2012-11-20 DIAGNOSIS — L270 Generalized skin eruption due to drugs and medicaments taken internally: Secondary | ICD-10-CM

## 2012-11-20 DIAGNOSIS — I10 Essential (primary) hypertension: Secondary | ICD-10-CM

## 2012-11-20 MED ORDER — METHYLPREDNISOLONE ACETATE 80 MG/ML IJ SUSP
80.0000 mg | Freq: Once | INTRAMUSCULAR | Status: AC
  Administered 2012-11-20: 80 mg via INTRAMUSCULAR

## 2012-11-20 MED ORDER — VITAMIN D 1000 UNITS PO TABS: 1000.0000 [IU] | ORAL_TABLET | Freq: Every day | ORAL | Status: DC

## 2012-11-20 MED ORDER — CLONIDINE HCL 0.1 MG PO TABS: 0.1000 mg | ORAL_TABLET | Freq: Two times a day (BID) | ORAL | Status: DC

## 2012-11-20 MED ORDER — TRIAMCINOLONE ACETONIDE 0.5 % EX CREA: 1.0000 "application " | TOPICAL_CREAM | Freq: Four times a day (QID) | CUTANEOUS | Status: DC

## 2012-11-20 NOTE — Progress Notes (Signed)
  Subjective:    Patient ID: Nancy Acevedo, female    DOB: January 25, 1960, 52 y.o.   MRN: [id number]  Rash This is a new problem. The current episode started in the past 7 days. The problem has been gradually worsening since onset. The affected locations include the left hand, left foot, right hand and right foot. She was exposed to a new medication (spironolactone). Pertinent negatives include no congestion, cough, diarrhea or fatigue. Past treatments include nothing. Her past medical history is significant for allergies. There is no history of asthma.  F/u HTN    Review of Systems  Constitutional: Negative for chills, activity change, appetite change, fatigue and unexpected weight change.  HENT: Negative for congestion, mouth sores and sinus pressure.   Eyes: Negative for visual disturbance.  Respiratory: Negative for cough and chest tightness.   Gastrointestinal: Negative for nausea, abdominal pain and diarrhea.  Genitourinary: Negative for frequency, difficulty urinating and vaginal pain.  Musculoskeletal: Negative for back pain and gait problem.  Skin: Positive for rash. Negative for pallor.  Neurological: Negative for dizziness, tremors, weakness, numbness and headaches.  Psychiatric/Behavioral: Negative for confusion and sleep disturbance.       Objective:   Physical Exam  Constitutional: She appears well-developed. No distress.  HENT:  Head: Normocephalic.  Right Ear: External ear normal.  Left Ear: External ear normal.  Nose: Nose normal.  Mouth/Throat: Oropharynx is clear and moist.  Eyes: Conjunctivae are normal. Pupils are equal, round, and reactive to light. Right eye exhibits no discharge. Left eye exhibits no discharge.  Neck: Normal range of motion. Neck supple. No JVD present. No tracheal deviation present. No thyromegaly present.  Cardiovascular: Normal rate, regular rhythm and normal heart sounds.   Pulmonary/Chest: No stridor. No respiratory distress. She has no  wheezes.  Abdominal: Soft. Bowel sounds are normal. She exhibits no distension and no mass. There is no tenderness. There is no rebound and no guarding.  Musculoskeletal: She exhibits no edema and no tenderness.  Lymphadenopathy:    She has no cervical adenopathy.  Neurological: She displays normal reflexes. No cranial nerve deficit. She exhibits normal muscle tone. Coordination normal.  Skin: Rash noted. There is erythema.  eryth rash on hands and feet  Psychiatric: She has a normal mood and affect. Her behavior is normal. Judgment and thought content normal.     LABS     Assessment & Plan:

## 2012-11-20 NOTE — Assessment & Plan Note (Signed)
Added Clonidine 

## 2012-11-20 NOTE — Assessment & Plan Note (Signed)
D/c Spironolactone Triamcinolone cream Claritin Depomedrol 80 mg IM

## 2012-11-20 NOTE — Progress Notes (Signed)
Pre-visit discussion using our clinic review tool. No additional management support is needed unless otherwise documented below in the visit note.  

## 2012-11-20 NOTE — Patient Instructions (Signed)
Wean yourself off Coke

## 2012-11-20 NOTE — Assessment & Plan Note (Signed)
Continue with current prescription therapy as reflected on the Med list.  

## 2012-11-21 ENCOUNTER — Ambulatory Visit: Payer: BC Managed Care – PPO | Admitting: Internal Medicine

## 2012-11-28 ENCOUNTER — Ambulatory Visit: Payer: BC Managed Care – PPO

## 2012-12-05 ENCOUNTER — Telehealth: Payer: Self-pay | Admitting: Internal Medicine

## 2012-12-05 ENCOUNTER — Ambulatory Visit: Payer: BC Managed Care – PPO

## 2012-12-05 NOTE — Telephone Encounter (Signed)
Patient called requesting a referral be sent to a kidney doctor , stated the doctor is DR Lowell Guitar [phone] Please advise if this can be done or if the patient need to be seen first

## 2012-12-05 NOTE — Telephone Encounter (Signed)
Called pt back inform her she would have to see md first before she could refer her out. Made appt for Friday 12/07/12,,,/lmb

## 2012-12-07 ENCOUNTER — Ambulatory Visit: Payer: BC Managed Care – PPO | Admitting: Internal Medicine

## 2012-12-19 ENCOUNTER — Ambulatory Visit: Payer: BC Managed Care – PPO

## 2012-12-27 ENCOUNTER — Ambulatory Visit: Payer: BC Managed Care – PPO

## 2012-12-31 ENCOUNTER — Ambulatory Visit: Payer: BC Managed Care – PPO | Admitting: Internal Medicine

## 2012-12-31 DIAGNOSIS — Z0289 Encounter for other administrative examinations: Secondary | ICD-10-CM

## 2013-01-03 ENCOUNTER — Ambulatory Visit: Payer: BC Managed Care – PPO

## 2013-02-06 ENCOUNTER — Ambulatory Visit (INDEPENDENT_AMBULATORY_CARE_PROVIDER_SITE_OTHER): Payer: BC Managed Care – PPO | Admitting: Family Medicine

## 2013-02-06 ENCOUNTER — Encounter: Payer: Self-pay | Admitting: Family Medicine

## 2013-02-06 ENCOUNTER — Encounter: Payer: Self-pay | Admitting: *Deleted

## 2013-02-06 ENCOUNTER — Telehealth: Payer: Self-pay | Admitting: Internal Medicine

## 2013-02-06 VITALS — BP 138/76 | HR 67 | Temp 98.1°F | Resp 16 | Wt 234.4 lb

## 2013-02-06 DIAGNOSIS — M62830 Muscle spasm of back: Secondary | ICD-10-CM | POA: Insufficient documentation

## 2013-02-06 DIAGNOSIS — M5380 Other specified dorsopathies, site unspecified: Secondary | ICD-10-CM

## 2013-02-06 DIAGNOSIS — M999 Biomechanical lesion, unspecified: Secondary | ICD-10-CM

## 2013-02-06 MED ORDER — MELOXICAM 15 MG PO TABS
15.0000 mg | ORAL_TABLET | Freq: Every day | ORAL | Status: DC
Start: 2013-02-06 — End: 2013-04-09

## 2013-02-06 MED ORDER — CYCLOBENZAPRINE HCL 10 MG PO TABS: 10.0000 mg | ORAL_TABLET | Freq: Three times a day (TID) | ORAL | Status: DC | PRN

## 2013-02-06 NOTE — Telephone Encounter (Signed)
Patient Information:  Caller Name: Jae  Phone: [phone]  Patient: Nancy Acevedo, Nancy Acevedo  Gender: Female  DOB: Aug 26, 1960  Age: 53 Years  PCP: Gwendolyn Grant (Adults only)  Pregnant: No  Office Follow Up:  Does the office need to follow up with this patient?: No  Instructions For The Office: N/A  RN Note:  Pt states she has already made an appt in office for this morning at 11:15 with the MD. She is parking in the parking lot of office now as she is speaking to triager. Advised that her sxs warrant dialing 911 and since she is walking into office right now, Triage RN advised her to alert front desk staff that Triage RN has just advised her to dial 911 for her sxs so that they may assist her.  Agreed to plan.  Symptoms  Reason For Call & Symptoms: Constant L chest pain since 1/17 that is worse with cough or movement. Feels like a tightness. Denies SOB, sweating, lightheadedness, nausea or any other sxs. States she thinks it feels like a pulled muscle or muscle spasm. Does have hx of HTN and takes 3 antihypertensive meds daily per pt.  Reviewed Health History In EMR: Yes  Reviewed Medications In EMR: Yes  Reviewed Allergies In EMR: Yes  Reviewed Surgeries / Procedures: Yes  Date of Onset of Symptoms: 02/02/2013 OB / GYN:  LMP: Unknown  Guideline(s) Used:  Chest Pain  Disposition Per Guideline:   Call EMS 911 Now  Reason For Disposition Reached:   Chest pain lasting longer than 5 minutes and ANY of the following:  Over 23 years old Over 86 years old and at least one cardiac risk factor (i.e., high blood pressure, diabetes, high cholesterol, obesity, smoker or strong family history of heart disease) Pain is crushing, pressure-like, or heavy  Took nitroglycerin and chest pain was not relieved History of heart disease (i.e., angina, heart attack, bypass surgery, angioplasty, CHF)  Advice Given:  N/A  Patient Will Follow Care Advice:  YES

## 2013-02-06 NOTE — Progress Notes (Signed)
   CC: Muscle spasm  HPI: Patient is a pleasant 53 year old female who unfortunately on Saturday went to reach for her iron and felt a sharp pain in her thoracic spine on the left side. Patient states since that time and has not improved. Patient states it seems to be localized on the left side of her thoracic spine with mild radiation around the rib cage. Patient denies any rash, denies any fevers or chills, denies any shortness of breath or any fevers or weight loss. Patient does have muscle spasms before states that this is similar but significantly worse. Denies any weakness in the upper trimmings. Patient with the severity of 9 /10 :  Past medical, surgical, family and social history reviewed. Medications reviewed all in the electronic medical record.   Review of Systems: No headache, visual changes, nausea, vomiting, diarrhea, constipation, dizziness, abdominal pain, skin rash, fevers, chills, night sweats, weight loss, swollen lymph nodes, body aches, joint swelling, muscle aches, chest pain, shortness of breath, mood changes.   Objective:    Blood pressure 138/76, pulse 67, temperature 98.1 F (36.7 C), temperature source Oral, resp. rate 16, weight 234 lb 6.4 oz (106.323 kg), SpO2 98.00%.   General: No apparent distress alert and oriented x3 mood and affect normal, dressed appropriately.  HEENT: Pupils equal, extraocular movements intact Respiratory: Patient's speak in full sentences and does not appear short of breath Cardiovascular: No lower extremity edema, non tender, no erythema Skin: Warm dry intact with no signs of infection or rash on extremities or on axial skeleton. Abdomen: Soft nontender Neuro: Cranial nerves II through XII are intact, neurovascularly intact in all extremities with 2+ DTRs and 2+ pulses. Lymph: No lymphadenopathy of posterior or anterior cervical chain or axillae bilaterally.  Gait normal with good balance and coordination.  MSK: Non tender with full  range of motion and good stability and symmetric strength and tone of shoulders, elbows, wrist, hip, knee and ankles bilaterally.  Back Exam:  Inspection: Unremarkable  Motion: Flexion 30 deg, Extension 35 deg, Side Bending to 35 deg bilaterally,  Rotation to 35 deg bilaterally  SLR laying: Negative  XSLR laying: Negative  Palpable tenderness: Severe tenderness of the paraspinal musculature from T5-T8 on the left side. There is a significant muscle spasm. 3 trigger points palpated.Marland Kitchen FABER: negative. Sensory change: Gross sensation intact to all lumbar and sacral dermatomes.  Reflexes: 2+ at both patellar tendons, 2+ at achilles tendons, Babinski's downgoing.  Strength at foot  Plantar-flexion: 5/5 Dorsi-flexion: 5/5 Eversion: 5/5 Inversion: 5/5  Leg strength  Quad: 5/5 Hamstring: 5/5 Hip flexor: 5/5 Hip abductors: 5/5  Gait unremarkable.  After verbal consent patient was prepped with alcohol swabs and with a 26-gauge half-inch needle was injected in 3 different trigger points with a total of 1 cc of 0.5% Marcaine and 1 cc of Kenalog 40 mg/dL. Patient tolerated the procedure well with some improvement in pain. Post injection instructions given.  Osteopathic findings T6 extended rotated and side bent left with a inhaled sixth rib  Impression and Recommendations:     This case required medical decision making of moderate complexity.

## 2013-02-06 NOTE — Assessment & Plan Note (Signed)
Decision today to treat with OMT was based on Physical Exam  After verbal consent patient was treated with hvla techniques in thoracic areas  Patient tolerated the procedure well with improvement in symptoms  Patient given exercises, stretches and lifestyle modifications  See medications in patient instructions if given  Patient will follow up in 1-2 weeks if not better

## 2013-02-06 NOTE — Progress Notes (Signed)
Pre-visit discussion using our clinic review tool. No additional management support is needed unless otherwise documented below in the visit note.  

## 2013-02-06 NOTE — Assessment & Plan Note (Signed)
Patient did have trigger point injections today with some relief. Patient also responded well to osteopathic manipulation. Patient will try these different home exercises that I gave her do certain 48 hours. Meloxicam as well as Flexeril given and warned the potential side effects.

## 2013-02-06 NOTE — Patient Instructions (Signed)
It is good to meet you Try exercises in 2 days and do them most days of the week for 2 weeks When picking anything up for next 2 weeks use both hands.  Flexeril you can use three times a day if it does ont make you tired.  meloxicam daily for 3 days then as needed.

## 2013-02-11 ENCOUNTER — Ambulatory Visit: Payer: BC Managed Care – PPO | Admitting: Family Medicine

## 2013-03-18 LAB — HM MAMMOGRAPHY: HM Mammogram: NORMAL

## 2013-04-09 ENCOUNTER — Ambulatory Visit (INDEPENDENT_AMBULATORY_CARE_PROVIDER_SITE_OTHER): Payer: BC Managed Care – PPO | Admitting: Internal Medicine

## 2013-04-09 ENCOUNTER — Encounter: Payer: Self-pay | Admitting: Internal Medicine

## 2013-04-09 ENCOUNTER — Other Ambulatory Visit (INDEPENDENT_AMBULATORY_CARE_PROVIDER_SITE_OTHER): Payer: BC Managed Care – PPO

## 2013-04-09 ENCOUNTER — Encounter: Payer: Self-pay | Admitting: *Deleted

## 2013-04-09 VITALS — BP 132/94 | HR 74 | Temp 97.7°F | Wt 237.1 lb

## 2013-04-09 DIAGNOSIS — F32A Depression, unspecified: Secondary | ICD-10-CM

## 2013-04-09 DIAGNOSIS — F329 Major depressive disorder, single episode, unspecified: Secondary | ICD-10-CM

## 2013-04-09 DIAGNOSIS — E119 Type 2 diabetes mellitus without complications: Secondary | ICD-10-CM

## 2013-04-09 DIAGNOSIS — F3289 Other specified depressive episodes: Secondary | ICD-10-CM

## 2013-04-09 LAB — LIPID PANEL
CHOLESTEROL: 207 mg/dL — AB (ref 0–200)
HDL: 61.8 mg/dL (ref >39.00)
LDL Cholesterol: 117 mg/dL — ABNORMAL HIGH (ref 0–99)
TRIGLYCERIDES: 143 mg/dL (ref 0.0–149.0)
Total CHOL/HDL Ratio: 3
VLDL: 28.6 mg/dL (ref 0.0–40.0)

## 2013-04-09 LAB — MICROALBUMIN / CREATININE URINE RATIO
Creatinine,U: 89.6 mg/dL
MICROALB UR: 0.6 mg/dL (ref 0.0–1.9)
Microalb Creat Ratio: 0.7 mg/g (ref 0.0–30.0)

## 2013-04-09 LAB — CBC WITH DIFFERENTIAL/PLATELET
Basophils Absolute: 0 10*3/uL (ref 0.0–0.1)
Basophils Relative: 0.2 % (ref 0.0–3.0)
EOS ABS: 0.1 10*3/uL (ref 0.0–0.7)
EOS PCT: 1.4 % (ref 0.0–5.0)
HCT: 36.3 % (ref 36.0–46.0)
Hemoglobin: 12 g/dL (ref 12.0–15.0)
LYMPHS PCT: 35.7 % (ref 12.0–46.0)
Lymphs Abs: 1.9 10*3/uL (ref 0.7–4.0)
MCHC: 33.1 g/dL (ref 30.0–36.0)
MCV: 91.7 fl (ref 78.0–100.0)
MONO ABS: 0.5 10*3/uL (ref 0.1–1.0)
Monocytes Relative: 10 % (ref 3.0–12.0)
NEUTROS PCT: 52.7 % (ref 43.0–77.0)
Neutro Abs: 2.8 10*3/uL (ref 1.4–7.7)
PLATELETS: 291 10*3/uL (ref 150.0–400.0)
RBC: 3.95 Mil/uL (ref 3.87–5.11)
RDW: 13.2 % (ref 11.5–14.6)
WBC: 5.3 10*3/uL (ref 4.5–10.5)

## 2013-04-09 LAB — BASIC METABOLIC PANEL
BUN: 19 mg/dL (ref 6–23)
CALCIUM: 9.4 mg/dL (ref 8.4–10.5)
CO2: 30 mEq/L (ref 19–32)
CREATININE: 1 mg/dL (ref 0.4–1.2)
Chloride: 104 mEq/L (ref 96–112)
GFR: 73.36 mL/min (ref >60.00)
Glucose, Bld: 87 mg/dL (ref 70–99)
Potassium: 3.8 mEq/L (ref 3.5–5.1)
Sodium: 141 mEq/L (ref 135–145)

## 2013-04-09 LAB — TSH: TSH: 0.86 u[IU]/mL (ref 0.35–5.50)

## 2013-04-09 LAB — HEMOGLOBIN A1C: HEMOGLOBIN A1C: 6 % (ref 4.6–6.5)

## 2013-04-09 MED ORDER — ESCITALOPRAM OXALATE 5 MG PO TABS: 5.0000 mg | ORAL_TABLET | Freq: Every day | ORAL | Status: DC

## 2013-04-09 MED ORDER — ALPRAZOLAM 0.5 MG PO TABS: 0.5000 mg | ORAL_TABLET | Freq: Two times a day (BID) | ORAL | Status: DC | PRN

## 2013-04-09 NOTE — Patient Instructions (Signed)
It was good to see you today.  Start low-dose Lexapro for mood and depression Work note x 1 week provided today  Test(s) ordered today. Your results will be released to [facility] (or called to you) after review, usually within 72hours after test completion. If any changes need to be made, you will be notified at that same time.  Please schedule followup in 4 weeks for medication and symptoms recheck, call sooner if problems.

## 2013-04-09 NOTE — Assessment & Plan Note (Signed)
Hx pregnancy induced hyperglycemia 1993 but "not diabetes mellitus after 3 hour test -GTT" New dx 01/2011 - started metformin, but variable compliance with same s/p nutrition eval for same recheck a1c now, adjust meds prn Again advised on need for healthy diet and exercise habits for weight reduction to control risk of DM dz Lab Results  Component Value Date   HGBA1C 6.2 08/06/2012

## 2013-04-09 NOTE — Assessment & Plan Note (Signed)
Chronic and classic symptoms present > 6 mo: binge eating, irritbility Intol of wellbutrin 06/2011 - self dc'd same Same intolerance of paxil trial 08/2011 x 30d Started citalopram 02/2012 - feels improved, says dtr has noted same, but then stopped 06/2012 Increase symptoms precipitated by ongoing divorce and work stressors Verified no SI/HI Reviewed options for tx -Pt agrees to Lexapro trial and counseling  follow up 4-6 weeks, sooner if worse

## 2013-04-09 NOTE — Progress Notes (Signed)
Pre visit review using our clinic review tool, if applicable. No additional management support is needed unless otherwise documented below in the visit note. 

## 2013-04-09 NOTE — Progress Notes (Signed)
Subjective:    Patient ID: Nancy Acevedo, female    DOB: 10/17/1960, 53 y.o.   MRN: [id number]  HPI  Patient is here for follow up  Reviewed chronic medical issues and interval medical events  Past Medical History  Diagnosis Date  . Hypertension   . Headaches, cluster   . History of hyperglycemia 1993  . Diabetes mellitus, type 2 dx 01/2011  . OSA (obstructive sleep apnea)   . EP (ectopic pregnancy)   . Candidiasis of mouth     Review of Systems  Constitutional: Positive for fatigue. Negative for fever and unexpected weight change.  Respiratory: Negative for cough and shortness of breath.   Cardiovascular: Negative for chest pain and leg swelling.  Psychiatric/Behavioral: Positive for sleep disturbance and decreased concentration. Negative for suicidal ideas, hallucinations and self-injury. The patient is nervous/anxious.        Objective:   Physical Exam  BP 132/94  Pulse 74  Temp(Src) 97.7 F (36.5 C) (Oral)  Wt 237 lb 1.9 oz (107.557 kg)  SpO2 98% Wt Readings from Last 3 Encounters:  04/09/13 237 lb 1.9 oz (107.557 kg)  02/06/13 234 lb 6.4 oz (106.323 kg)  11/20/12 239 lb (108.41 kg)    Constitutional: She is obese, but appears well-developed and well-nourished. No distress.  Neck: Normal range of motion. Neck supple. No JVD present. No thyromegaly present.  Cardiovascular: Normal rate, regular rhythm and normal heart sounds.  No murmur heard. No BLE edema. Pulmonary/Chest: Effort normal and breath sounds normal. No respiratory distress. She has no wheezes.  Psychiatric: She has a dysphoric, anxious mood and freq tearful affect. Her behavior is normal. Judgment and thought content normal.   Lab Results  Component Value Date   WBC 5.3 03/08/2012   HGB 13.0 03/08/2012   HCT 37.8 03/08/2012   PLT 308 03/08/2012   GLUCOSE 101* 11/13/2012   CHOL 176 02/24/2012   TRIG 82.0 02/24/2012   HDL 42.10 02/24/2012   LDLCALC 118* 02/24/2012   ALT 43* 08/06/2012   AST 37  08/06/2012   NA 140 11/13/2012   K 4.0 11/13/2012   K 4.0 11/13/2012   CL 106 11/13/2012   CREATININE 0.9 11/13/2012   BUN 11 11/13/2012   CO2 27 11/13/2012   TSH 0.98 02/17/2011   HGBA1C 6.2 08/06/2012    Dg Neck Soft Tissue  06/29/2012   *RADIOLOGY REPORT*  Clinical Data: Sensation of something stuck in throat.  NECK SOFT TISSUES - 1+ VIEW  Comparison: None.  Findings: No definite opaque foreign body is seen.  No prevertebral soft tissue swelling is evident.  Epiglottis appears normal.  Air within the hypopharynx and within the trachea appears normal.  There is narrowing of the intervertebral disc space at the level of C4-C5.  Multilevel marginal osteophyte formation is seen representing degenerative spondylosis.  IMPRESSION: No opaque foreign body is evident.  Changes of degenerative disc disease and degenerative spondylosis.   Original Report Authenticated By: Shanon Brow Call       Assessment & Plan:   Problem List Items Addressed This Visit   Depression - Primary     Chronic and classic symptoms present > 6 mo: binge eating, irritbility Intol of wellbutrin 06/2011 - self dc'd same Same intolerance of paxil trial 08/2011 x 30d Started citalopram 02/2012 - feels improved, says dtr has noted same, but then stopped 06/2012 Increase symptoms precipitated by ongoing divorce and work stressors Verified no SI/HI Reviewed options for tx -Pt agrees to Lexapro trial  and counseling  follow up 4-6 weeks, sooner if worse    Relevant Medications      escitalopram (LEXAPRO) tablet      ALPRAZolam Duanne Moron) tablet   Other Relevant Orders      CBC with Differential (Completed)      TSH (Completed)   Diabetes mellitus, type 2      Hx pregnancy induced hyperglycemia 1993 but "not diabetes mellitus after 3 hour test -GTT" New dx 01/2011 - started metformin, but variable compliance with same s/p nutrition eval for same recheck a1c now, adjust meds prn Again advised on need for healthy diet and exercise  habits for weight reduction to control risk of DM dz Lab Results  Component Value Date   HGBA1C 6.2 08/06/2012      Relevant Orders      Hemoglobin A1c (Completed)      Basic metabolic panel (Completed)      Lipid panel (Completed)      Microalbumin / creatinine urine ratio (Completed)

## 2013-04-29 ENCOUNTER — Other Ambulatory Visit: Payer: Self-pay | Admitting: Family Medicine

## 2013-05-06 ENCOUNTER — Ambulatory Visit (INDEPENDENT_AMBULATORY_CARE_PROVIDER_SITE_OTHER): Payer: BC Managed Care – PPO | Admitting: Internal Medicine

## 2013-05-06 ENCOUNTER — Encounter: Payer: Self-pay | Admitting: Internal Medicine

## 2013-05-06 VITALS — BP 142/90 | HR 65 | Temp 98.1°F | Wt 241.2 lb

## 2013-05-06 DIAGNOSIS — F32A Depression, unspecified: Secondary | ICD-10-CM

## 2013-05-06 DIAGNOSIS — Z1211 Encounter for screening for malignant neoplasm of colon: Secondary | ICD-10-CM

## 2013-05-06 DIAGNOSIS — F329 Major depressive disorder, single episode, unspecified: Secondary | ICD-10-CM

## 2013-05-06 DIAGNOSIS — F3289 Other specified depressive episodes: Secondary | ICD-10-CM

## 2013-05-06 DIAGNOSIS — E119 Type 2 diabetes mellitus without complications: Secondary | ICD-10-CM

## 2013-05-06 DIAGNOSIS — I10 Essential (primary) hypertension: Secondary | ICD-10-CM

## 2013-05-06 MED ORDER — CLONIDINE HCL 0.1 MG PO TABS: 0.1000 mg | ORAL_TABLET | Freq: Three times a day (TID) | ORAL | Status: DC

## 2013-05-06 NOTE — Assessment & Plan Note (Signed)
resumed Tribenzor 11/2010 On extended release beta blocker And started clonidine 11/2012 Still subop Increase clonidine dose and encouraged continued work on diet/exercise Patient to notify us if blood pressure remains greater than 140/85  BP Readings from Last 3 Encounters:  05/06/13 142/90  04/09/13 132/94  02/06/13 138/76

## 2013-05-06 NOTE — Patient Instructions (Signed)
It was good to see you today.  We have reviewed your prior records including labs and tests today  Medications reviewed and updated Increase clonidine to 3 times daily for blood pressure. No other changes recommended  Your prescription(s) have been submitted to your pharmacy. Please take as directed and contact our office if you believe you are having problem(s) with the medication(s).  Work on lifestyle changes as discussed (low fat, low carb, increased protein diet; improved exercise efforts; weight loss) to control sugar, blood pressure and cholesterol levels and/or reduce risk of developing other medical problems. Look into [email or web address] or other type of food journal to assist you in this process.  Please schedule followup in 3-4 months for diabetes mellitus and blood pressure check, call sooner if problems.

## 2013-05-06 NOTE — Assessment & Plan Note (Signed)
Hx pregnancy induced hyperglycemia 1993 but "not diabetes mellitus after 3 hour test -GTT" New dx 01/2011 - started metformin, but variable compliance with same s/p nutrition eval for same recheck a1c q3-75mo, adjust meds prn Again advised on need for healthy diet and exercise habits for weight reduction to control risk of DM dz Lab Results  Component Value Date   HGBA1C 6.0 04/09/2013

## 2013-05-06 NOTE — Assessment & Plan Note (Signed)
Chronic and classic symptoms present > 6 mo: binge eating, irritbility Intol of wellbutrin 06/2011 - self dc'd same Same intolerance of paxil trial 08/2011 x 30d Started citalopram 02/2012 - feels improved, says dtr has noted same, but then stopped 06/2012 Increase symptoms precipitated by ongoing divorce and work stressors trial Lexapro 03/2013 caused itch so stopped same Reports mood stable and no meds desired at this time

## 2013-05-06 NOTE — Progress Notes (Signed)
Pre visit review using our clinic review tool, if applicable. No additional management support is needed unless otherwise documented below in the visit note. 

## 2013-05-06 NOTE — Progress Notes (Signed)
Subjective:    Patient ID: Nancy Acevedo, female    DOB: 1960/12/27, 53 y.o.   MRN: [id number]  HPI  Patient is here for follow up  Reviewed chronic medical issues and interval medical events  Past Medical History  Diagnosis Date  . Hypertension   . Headaches, cluster   . History of hyperglycemia 1993  . Diabetes mellitus, type 2 dx 01/2011  . OSA (obstructive sleep apnea)   . EP (ectopic pregnancy)   . Candidiasis of mouth     Review of Systems  Constitutional: Positive for fatigue. Negative for fever and unexpected weight change.  Respiratory: Negative for cough and shortness of breath.   Cardiovascular: Negative for chest pain and leg swelling.  Psychiatric/Behavioral: Negative for suicidal ideas, self-injury, dysphoric mood and decreased concentration. The patient is not nervous/anxious.        Objective:   Physical Exam  BP 142/90  Pulse 65  Temp(Src) 98.1 F (36.7 C) (Oral)  Wt 241 lb 3.2 oz (109.408 kg)  SpO2 97% Wt Readings from Last 3 Encounters:  05/06/13 241 lb 3.2 oz (109.408 kg)  04/09/13 237 lb 1.9 oz (107.557 kg)  02/06/13 234 lb 6.4 oz (106.323 kg)   Constitutional: She is overweight, but appears well-developed and well-nourished. No distress.  Neck: Normal range of motion. Neck supple. No JVD present. No thyromegaly present.  Cardiovascular: Normal rate, regular rhythm and normal heart sounds.  No murmur heard. No BLE edema. Pulmonary/Chest: Effort normal and breath sounds normal. No respiratory distress. She has no wheezes.  Psychiatric: She has a mildly dysphoric mood and affect, but smiles easily. Her behavior is normal. Judgment and thought content normal.   Lab Results  Component Value Date   WBC 5.3 04/09/2013   HGB 12.0 04/09/2013   HCT 36.3 04/09/2013   PLT 291.0 04/09/2013   GLUCOSE 87 04/09/2013   CHOL 207* 04/09/2013   TRIG 143.0 04/09/2013   HDL 61.80 04/09/2013   LDLCALC 117* 04/09/2013   ALT 43* 08/06/2012   AST 37 08/06/2012   NA  141 04/09/2013   K 3.8 04/09/2013   CL 104 04/09/2013   CREATININE 1.0 04/09/2013   BUN 19 04/09/2013   CO2 30 04/09/2013   TSH 0.86 04/09/2013   HGBA1C 6.0 04/09/2013   MICROALBUR 0.6 04/09/2013    Dg Neck Soft Tissue  06/29/2012   *RADIOLOGY REPORT*  Clinical Data: Sensation of something stuck in throat.  NECK SOFT TISSUES - 1+ VIEW  Comparison: None.  Findings: No definite opaque foreign body is seen.  No prevertebral soft tissue swelling is evident.  Epiglottis appears normal.  Air within the hypopharynx and within the trachea appears normal.  There is narrowing of the intervertebral disc space at the level of C4-C5.  Multilevel marginal osteophyte formation is seen representing degenerative spondylosis.  IMPRESSION: No opaque foreign body is evident.  Changes of degenerative disc disease and degenerative spondylosis.   Original Report Authenticated By: Shanon Brow Call       Assessment & Plan:   Problem List Items Addressed This Visit   Depression     Chronic and classic symptoms present > 6 mo: binge eating, irritbility Intol of wellbutrin 06/2011 - self dc'd same Same intolerance of paxil trial 08/2011 x 30d Started citalopram 02/2012 - feels improved, says dtr has noted same, but then stopped 06/2012 Increase symptoms precipitated by ongoing divorce and work stressors trial Lexapro 03/2013 caused itch so stopped same Reports mood stable and no meds desired  at this time    Diabetes mellitus, type 2      Hx pregnancy induced hyperglycemia 1993 but "not diabetes mellitus after 3 hour test -GTT" New dx 01/2011 - started metformin, but variable compliance with same s/p nutrition eval for same recheck a1c q3-59mo, adjust meds prn Again advised on need for healthy diet and exercise habits for weight reduction to control risk of DM dz Lab Results  Component Value Date   HGBA1C 6.0 04/09/2013      Hypertension - Primary      resumed Tribenzor 11/2010 On extended release beta blocker And started  clonidine 11/2012 Still subop Increase clonidine dose and encouraged continued work on diet/exercise Patient to notify us if blood pressure remains greater than 140/85  BP Readings from Last 3 Encounters:  05/06/13 142/90  04/09/13 132/94  02/06/13 138/76      Relevant Medications      cloNIDine (CATAPRES) tablet

## 2013-05-07 ENCOUNTER — Telehealth: Payer: Self-pay | Admitting: Internal Medicine

## 2013-05-07 NOTE — Telephone Encounter (Signed)
Relevant patient education assigned to patient using Emmi. ° °

## 2013-05-13 LAB — HM DIABETES EYE EXAM

## 2013-05-17 ENCOUNTER — Telehealth: Payer: Self-pay

## 2013-05-17 NOTE — Telephone Encounter (Signed)
Relevant patient education assigned to patient using Emmi. ° °

## 2013-06-25 ENCOUNTER — Encounter: Payer: BC Managed Care – PPO | Admitting: Internal Medicine

## 2013-07-23 ENCOUNTER — Telehealth: Payer: Self-pay

## 2013-07-23 NOTE — Telephone Encounter (Signed)
Diabetic bundle- called and LM for pt TCB and schedule 50month follow up concerning diabetes

## 2013-07-27 ENCOUNTER — Emergency Department (HOSPITAL_BASED_OUTPATIENT_CLINIC_OR_DEPARTMENT_OTHER)
Admission: EM | Admit: 2013-07-27 | Discharge: 2013-07-27 | Disposition: A | Discharge status: Home / Self Care | Payer: BC Managed Care – PPO | Attending: Emergency Medicine | Admitting: Emergency Medicine

## 2013-07-27 ENCOUNTER — Encounter (HOSPITAL_BASED_OUTPATIENT_CLINIC_OR_DEPARTMENT_OTHER): Payer: Self-pay | Admitting: Emergency Medicine

## 2013-07-27 DIAGNOSIS — Z8619 Personal history of other infectious and parasitic diseases: Secondary | ICD-10-CM | POA: Insufficient documentation

## 2013-07-27 DIAGNOSIS — Z79899 Other long term (current) drug therapy: Secondary | ICD-10-CM | POA: Insufficient documentation

## 2013-07-27 DIAGNOSIS — N644 Mastodynia: Secondary | ICD-10-CM

## 2013-07-27 DIAGNOSIS — I10 Essential (primary) hypertension: Secondary | ICD-10-CM | POA: Insufficient documentation

## 2013-07-27 DIAGNOSIS — Z8669 Personal history of other diseases of the nervous system and sense organs: Secondary | ICD-10-CM | POA: Insufficient documentation

## 2013-07-27 DIAGNOSIS — E119 Type 2 diabetes mellitus without complications: Secondary | ICD-10-CM | POA: Insufficient documentation

## 2013-07-27 MED ORDER — HYDROCODONE-ACETAMINOPHEN 5-325 MG PO TABS: 1.0000 | ORAL_TABLET | Freq: Four times a day (QID) | ORAL | Status: DC | PRN

## 2013-07-27 NOTE — ED Provider Notes (Signed)
CSN: [id number]     Arrival date & time 07/27/13  0015 History   First MD Initiated Contact with Patient 07/27/13 0041     Chief Complaint  Patient presents with  . Breast Pain      (Consider location/radiation/quality/duration/timing/severity/associated sxs/prior Treatment) HPI This 53 year old female with a two-day history of pain in her right breast. The pain is located in the lateral aspect of the breast. It is worse with palpation, movement and certain positions. The pain is described as a deep ache and is moderate to severe at its worst. She denies trauma to her chest wall. There is no associated systemic symptoms such as fever, chills, nausea or vomiting. There is no redness, swelling or rash associated with the pain. There is no associated back pain.  Past Medical History  Diagnosis Date  . Hypertension   . Headaches, cluster   . History of hyperglycemia 1993  . Diabetes mellitus, type 2 dx 01/2011  . OSA (obstructive sleep apnea)   . EP (ectopic pregnancy)   . Candidiasis of mouth    Past Surgical History  Procedure Laterality Date  . Tonsillectomy and adenoidectomy  2012  . Ectopic pregnancy surgery      left fallopian tube   Family History  Problem Relation Age of Onset  . Arthritis Other   . Diabetes Mother   . Hypertension Mother    History  Substance Use Topics  . Smoking status: Never Smoker   . Smokeless tobacco: Never Used  . Alcohol Use: No   OB History   Grav Para Term Preterm Abortions TAB SAB Ect Mult Living                 Review of Systems  All other systems reviewed and are negative.   Allergies  Lexapro and Spironolactone  Home Medications   Prior to Admission medications   Medication Sig Start Date End Date Taking? Authorizing Provider  ALPRAZolam Duanne Moron) 0.5 MG tablet Take 1 tablet (0.5 mg total) by mouth 2 (two) times daily as needed for anxiety or sleep. 04/09/13   Rowe Clack, MD  cholecalciferol (VITAMIN D) 1000 UNITS  tablet Take 1,000 Units by mouth as needed. 11/20/12 11/20/13  Aleksei Plotnikov V, MD  cloNIDine (CATAPRES) 0.1 MG tablet Take 1 tablet (0.1 mg total) by mouth 3 (three) times daily. 05/06/13   Rowe Clack, MD  metFORMIN (GLUCOPHAGE-XR) 500 MG 24 hr tablet Take 1 tablet (500 mg total) by mouth daily with breakfast. 02/27/12   Rowe Clack, MD  metoprolol succinate (TOPROL-XL) 50 MG 24 hr tablet Take 1 tablet (50 mg total) by mouth daily. Take with or immediately following a meal. 08/20/12   Rowe Clack, MD  Olmesartan-Amlodipine-HCTZ [facility]) 40-10-25 MG TABS Take 1 tablet by mouth daily. 08/20/12   Rowe Clack, MD   BP 157/93  Pulse 88  Temp(Src) 98.2 F (36.8 C) (Oral)  Resp 16  Ht 5\' 4"  (1.626 m)  Wt 240 lb (108.863 kg)  BMI 41.18 kg/m2  SpO2 99%  Physical Exam General: Well-developed, well-nourished female in no acute distress; appearance consistent with age of record HENT: normocephalic; atraumatic Eyes: pupils equal, round and reactive to light; extraocular muscles intact Neck: supple Heart: regular rate and rhythm Lungs: clear to auscultation bilaterally Breasts: Symmetric; tenderness of lateral aspect of right breast without mass, fluctuance, swelling, induration, warmth, rash or axillary lymphadenopathy Abdomen: soft; nondistended; nontender; no masses or hepatosplenomegaly; bowel sounds present Extremities: No deformity; full  range of motion; pulses normal Neurologic: Awake, alert and oriented; motor function intact in all extremities and symmetric; no facial droop Skin: Warm and dry Psychiatric: Normal mood and affect    ED Course  Procedures (including critical care time)  MDM  Patient was advised to be reevaluated should she develop a rash, redness or swelling of the right breast. At this time there is no evidence of infection.    Wynetta Fines, MD 07/27/13 [phone]

## 2013-07-27 NOTE — ED Notes (Signed)
Pt c/o right throbbing breast pain x 2 days

## 2013-07-27 NOTE — Discharge Instructions (Signed)
Breast Tenderness Breast tenderness is a common problem for women of all ages. Breast tenderness may cause mild discomfort to severe pain. It has a variety of causes. Your health care provider will find out the likely cause of your breast tenderness by examining your breasts, asking you about symptoms, and possibly ordering some tests. Breast tenderness usually does not mean you have breast cancer. HOME CARE INSTRUCTIONS  Breast tenderness often can be handled at home. You can try:  Getting fitted for a new bra that provides more support, especially during exercise.  Wearing a more supportive bra or sports bra while sleeping when your breasts are very tender.  If you have a breast injury, apply ice to the area:  Put ice in a plastic bag.  Place a towel between your skin and the bag.  Leave the ice on for 20 minutes, 2-3 times a day.  If your breasts are too full of milk as a result of breastfeeding, try:  Expressing milk either by hand or with a breast pump.  Applying a warm compress to the breasts for relief.  Taking over-the-counter pain relievers, if approved by your health care provider.  Taking other medicines that your health care provider prescribes. These may include antibiotic medicines or birth control pills. Over the long term, your breast tenderness might be eased if you:  Cut down on caffeine.  Reduce the amount of fat in your diet. Keep a log of the days and times when your breasts are most tender. This will help you and your health care provider find the cause of the tenderness and how to relieve it. Also, learn how to do breast exams at home. This will help you notice if you have an unusual growth or lump that could cause tenderness. SEEK MEDICAL CARE IF:   Any part of your breast is hard, red, and hot to the touch. This could be a sign of infection.  Fluid is coming out of your nipples (and you are not breastfeeding). Especially watch for blood or pus.  You have  a fever as well as breast tenderness.  You have a new or painful lump in your breast that remains after your menstrual period ends.  You have tried to take care of the pain at home, but it has not gone away.  Your breast pain is getting worse, or the pain is making it hard to do the things you usually do during your day. Document Released: 12/17/2007 Document Revised: 09/05/2012 Document Reviewed: 08/02/2012 [facility] Patient Information 2015 [place], [place]. This information is not intended to replace advice given to you by your health care provider. Make sure you discuss any questions you have with your health care provider.

## 2013-09-07 ENCOUNTER — Other Ambulatory Visit: Payer: Self-pay | Admitting: Internal Medicine

## 2013-09-25 ENCOUNTER — Ambulatory Visit: Payer: BC Managed Care – PPO | Admitting: Internal Medicine

## 2013-10-18 ENCOUNTER — Emergency Department (HOSPITAL_BASED_OUTPATIENT_CLINIC_OR_DEPARTMENT_OTHER): Payer: BC Managed Care – PPO

## 2013-10-18 ENCOUNTER — Emergency Department (HOSPITAL_BASED_OUTPATIENT_CLINIC_OR_DEPARTMENT_OTHER)
Admission: EM | Admit: 2013-10-18 | Discharge: 2013-10-18 | Disposition: A | Discharge status: Home / Self Care | Payer: BC Managed Care – PPO | Attending: Emergency Medicine | Admitting: Emergency Medicine

## 2013-10-18 ENCOUNTER — Encounter (HOSPITAL_BASED_OUTPATIENT_CLINIC_OR_DEPARTMENT_OTHER): Payer: Self-pay | Admitting: Emergency Medicine

## 2013-10-18 DIAGNOSIS — M79673 Pain in unspecified foot: Secondary | ICD-10-CM | POA: Diagnosis present

## 2013-10-18 DIAGNOSIS — Z8619 Personal history of other infectious and parasitic diseases: Secondary | ICD-10-CM | POA: Diagnosis not present

## 2013-10-18 DIAGNOSIS — Z79899 Other long term (current) drug therapy: Secondary | ICD-10-CM | POA: Insufficient documentation

## 2013-10-18 DIAGNOSIS — G44009 Cluster headache syndrome, unspecified, not intractable: Secondary | ICD-10-CM | POA: Insufficient documentation

## 2013-10-18 DIAGNOSIS — I10 Essential (primary) hypertension: Secondary | ICD-10-CM | POA: Insufficient documentation

## 2013-10-18 DIAGNOSIS — M79672 Pain in left foot: Secondary | ICD-10-CM | POA: Diagnosis not present

## 2013-10-18 DIAGNOSIS — E119 Type 2 diabetes mellitus without complications: Secondary | ICD-10-CM | POA: Insufficient documentation

## 2013-10-18 MED ORDER — HYDROCODONE-ACETAMINOPHEN 5-325 MG PO TABS: 1.0000 | ORAL_TABLET | Freq: Four times a day (QID) | ORAL | Status: DC | PRN

## 2013-10-18 MED ORDER — HYDROCODONE-ACETAMINOPHEN 5-325 MG PO TABS
1.0000 | ORAL_TABLET | Freq: Once | ORAL | Status: AC
  Administered 2013-10-18: 1 via ORAL
  Filled 2013-10-18: qty 1

## 2013-10-18 NOTE — ED Provider Notes (Signed)
CSN: [id number]     Arrival date & time 10/18/13  1902 History   First MD Initiated Contact with Patient 10/18/13 1923     Chief Complaint  Patient presents with  . Foot Pain     (Consider location/radiation/quality/duration/timing/severity/associated sxs/prior Treatment) Patient is a 53 y.o. female presenting with lower extremity pain. The history is provided by the patient. No language interpreter was used.  Foot Pain This is a new problem. The current episode started in the past 7 days. Pertinent negatives include no chills, fever or numbness. Associated symptoms comments: Left foot pain and swelling for the past 3 days without known injury. Today, dorsal swelling and pain increased. No redness, wound, fever or pain proximal to dorsal forefoot..    Past Medical History  Diagnosis Date  . Hypertension   . Headaches, cluster   . History of hyperglycemia 1993  . Diabetes mellitus, type 2 dx 01/2011  . OSA (obstructive sleep apnea)   . EP (ectopic pregnancy)   . Candidiasis of mouth    Past Surgical History  Procedure Laterality Date  . Tonsillectomy and adenoidectomy  2012  . Ectopic pregnancy surgery      left fallopian tube   Family History  Problem Relation Age of Onset  . Arthritis Other   . Diabetes Mother   . Hypertension Mother    History  Substance Use Topics  . Smoking status: Never Smoker   . Smokeless tobacco: Never Used  . Alcohol Use: No   OB History   Grav Para Term Preterm Abortions TAB SAB Ect Mult Living                 Review of Systems  Constitutional: Negative for fever and chills.  Respiratory: Negative.   Cardiovascular: Negative.   Gastrointestinal: Negative.   Musculoskeletal:       See HPI.  Skin: Negative.  Negative for color change and wound.  Neurological: Negative.  Negative for numbness.      Allergies  Lexapro and Spironolactone  Home Medications   Prior to Admission medications   Medication Sig Start Date End Date  Taking? Authorizing Provider  ALPRAZolam Duanne Moron) 0.5 MG tablet Take 1 tablet (0.5 mg total) by mouth 2 (two) times daily as needed for anxiety or sleep. 04/09/13   Rowe Clack, MD  cholecalciferol (VITAMIN D) 1000 UNITS tablet Take 1,000 Units by mouth as needed. 11/20/12 11/20/13  Aleksei Plotnikov V, MD  cloNIDine (CATAPRES) 0.1 MG tablet Take 1 tablet (0.1 mg total) by mouth 3 (three) times daily. 05/06/13   Rowe Clack, MD  HYDROcodone-acetaminophen (NORCO/VICODIN) 5-325 MG per tablet Take 1-2 tablets by mouth every 6 (six) hours as needed. 07/27/13   Karen Chafe Molpus, MD  metFORMIN (GLUCOPHAGE-XR) 500 MG 24 hr tablet Take 1 tablet (500 mg total) by mouth daily with breakfast. 02/27/12   Rowe Clack, MD  metoprolol succinate (TOPROL-XL) 50 MG 24 hr tablet Take 1 tablet (50 mg total) by mouth daily. Take with or immediately following a meal. 08/20/12   Rowe Clack, MD  TRIBENZOR 40-10-25 MG TABS TAKE 1 TABLET BY MOUTH EVERY DAY 09/07/13   Rowe Clack, MD   BP 188/97  Pulse 82  Temp(Src) 98.6 F (37 C) (Oral)  Resp 18  Ht 5\' 4"  (1.626 m)  Wt 250 lb (113.399 kg)  BMI 42.89 kg/m2  SpO2 100% Physical Exam  Constitutional: She is oriented to person, place, and time. She appears well-developed and well-nourished.  No distress.  Musculoskeletal: Normal range of motion.  Left foot has a soft, circular swelling on dorsum overlying base of 3rd MT. No redness or warmth. No blistering or pustule. Markedly tender.   Neurological: She is alert and oriented to person, place, and time.  Skin: Skin is warm and dry. No erythema.  Psychiatric: She has a normal mood and affect.    ED Course  Procedures (including critical care time) Labs Review Labs Reviewed - No data to display  Imaging Review Dg Foot Complete Left  10/18/2013   CLINICAL DATA:  Acute left lateral foot pain and swelling. Difficulty bearing weight.  EXAM: LEFT FOOT - COMPLETE 3+ VIEW  COMPARISON:  None.   FINDINGS: No acute osseous or joint abnormality. Deformity of the fifth digit may be posttraumatic in etiology. Calcaneal spur. Soft tissue swelling is seen along the dorsum of the foot.  IMPRESSION: 1. Dorsal soft tissue swelling without acute osseous or joint abnormality. 2. Deformity of the fifth digit may be posttraumatic in etiology. An inflammatory arthropathy is not excluded.   Electronically Signed   By: Lorin Picket M.D.   On: 10/18/2013 20:12     EKG Interpretation None      MDM   Final diagnoses:  None    1. Left foot pain  No fever, redness or induration - doubt cutaneous abscess or cellulitis. Negative imaging. Will provide pain management and encourage PCP recheck in 2 days, ice and elevation.    Dewaine Oats, PA-C 10/18/13 2032

## 2013-10-18 NOTE — Discharge Instructions (Signed)
KEEP FOOT ELEVATED, APPLY COOL COMPRESSES. TAKE NORCO FOR PAIN AND FOLLOW UP WITH YOUR DOCTOR IN 2 DAYS FOR RECHECK. RETURN HERE AS NEEDED.  Cryotherapy Cryotherapy means treatment with cold. Ice or gel packs can be used to reduce both pain and swelling. Ice is the most helpful within the first 24 to 48 hours after an injury or flare-up from overusing a muscle or joint. Sprains, strains, spasms, burning pain, shooting pain, and aches can all be eased with ice. Ice can also be used when recovering from surgery. Ice is effective, has very few side effects, and is safe for most people to use. PRECAUTIONS  Ice is not a safe treatment option for people with:  Raynaud phenomenon. This is a condition affecting small blood vessels in the extremities. Exposure to cold may cause your problems to return.  Cold hypersensitivity. There are many forms of cold hypersensitivity, including:  Cold urticaria. Red, itchy hives appear on the skin when the tissues begin to warm after being iced.  Cold erythema. This is a red, itchy rash caused by exposure to cold.  Cold hemoglobinuria. Red blood cells break down when the tissues begin to warm after being iced. The hemoglobin that carry oxygen are passed into the urine because they cannot combine with blood proteins fast enough.  Numbness or altered sensitivity in the area being iced. If you have any of the following conditions, do not use ice until you have discussed cryotherapy with your caregiver:  Heart conditions, such as arrhythmia, angina, or chronic heart disease.  High blood pressure.  Healing wounds or open skin in the area being iced.  Current infections.  Rheumatoid arthritis.  Poor circulation.  Diabetes. Ice slows the blood flow in the region it is applied. This is beneficial when trying to stop inflamed tissues from spreading irritating chemicals to surrounding tissues. However, if you expose your skin to cold temperatures for too long or  without the proper protection, you can damage your skin or nerves. Watch for signs of skin damage due to cold. HOME CARE INSTRUCTIONS Follow these tips to use ice and cold packs safely.  Place a dry or damp towel between the ice and skin. A damp towel will cool the skin more quickly, so you may need to shorten the time that the ice is used.  For a more rapid response, add gentle compression to the ice.  Ice for no more than 10 to 20 minutes at a time. The bonier the area you are icing, the less time it will take to get the benefits of ice.  Check your skin after 5 minutes to make sure there are no signs of a poor response to cold or skin damage.  Rest 20 minutes or more between uses.  Once your skin is numb, you can end your treatment. You can test numbness by very lightly touching your skin. The touch should be so light that you do not see the skin dimple from the pressure of your fingertip. When using ice, most people will feel these normal sensations in this order: cold, burning, aching, and numbness.  Do not use ice on someone who cannot communicate their responses to pain, such as small children or people with dementia. HOW TO MAKE AN ICE PACK Ice packs are the most common way to use ice therapy. Other methods include ice massage, ice baths, and cryosprays. Muscle creams that cause a cold, tingly feeling do not offer the same benefits that ice offers and should not  be used as a substitute unless recommended by your caregiver. To make an ice pack, do one of the following:  Place crushed ice or a bag of frozen vegetables in a sealable plastic bag. Squeeze out the excess air. Place this bag inside another plastic bag. Slide the bag into a pillowcase or place a damp towel between your skin and the bag.  Mix 3 parts water with 1 part rubbing alcohol. Freeze the mixture in a sealable plastic bag. When you remove the mixture from the freezer, it will be slushy. Squeeze out the excess air. Place  this bag inside another plastic bag. Slide the bag into a pillowcase or place a damp towel between your skin and the bag. SEEK MEDICAL CARE IF:  You develop white spots on your skin. This may give the skin a blotchy (mottled) appearance.  Your skin turns blue or pale.  Your skin becomes waxy or hard.  Your swelling gets worse. MAKE SURE YOU:   Understand these instructions.  Will watch your condition.  Will get help right away if you are not doing well or get worse. Document Released: 08/30/2010 Document Revised: 05/20/2013 Document Reviewed: 08/30/2010 [facility] Patient Information 2015 [place], [place]. This information is not intended to replace advice given to you by your health care provider. Make sure you discuss any questions you have with your health care provider.

## 2013-10-18 NOTE — ED Provider Notes (Signed)
Medical screening examination/treatment/procedure(s) were performed by non-physician practitioner and as supervising physician I was immediately available for consultation/collaboration.   EKG Interpretation None        Malvin Johns, MD 10/18/13 2333

## 2013-10-18 NOTE — ED Provider Notes (Signed)
       Malvin Johns, MD 10/18/13 [phone]

## 2013-10-18 NOTE — ED Notes (Signed)
C/o painful knot on left foot x 3 days-denies injury

## 2013-10-21 ENCOUNTER — Encounter: Payer: Self-pay | Admitting: Family

## 2013-10-21 ENCOUNTER — Ambulatory Visit (INDEPENDENT_AMBULATORY_CARE_PROVIDER_SITE_OTHER): Payer: BC Managed Care – PPO | Admitting: Family

## 2013-10-21 ENCOUNTER — Other Ambulatory Visit (INDEPENDENT_AMBULATORY_CARE_PROVIDER_SITE_OTHER): Payer: BC Managed Care – PPO

## 2013-10-21 ENCOUNTER — Telehealth: Payer: Self-pay | Admitting: Family

## 2013-10-21 VITALS — BP 150/90 | HR 83 | Temp 98.6°F | Ht 64.0 in | Wt 249.1 lb

## 2013-10-21 DIAGNOSIS — M25579 Pain in unspecified ankle and joints of unspecified foot: Secondary | ICD-10-CM | POA: Insufficient documentation

## 2013-10-21 DIAGNOSIS — M25572 Pain in left ankle and joints of left foot: Secondary | ICD-10-CM | POA: Diagnosis not present

## 2013-10-21 LAB — URIC ACID: Uric Acid, Serum: 7.7 mg/dL — ABNORMAL HIGH (ref 2.4–7.0)

## 2013-10-21 MED ORDER — MELOXICAM 7.5 MG PO TABS: 7.5000 mg | ORAL_TABLET | Freq: Every day | ORAL | Status: DC

## 2013-10-21 NOTE — Progress Notes (Signed)
   Subjective:    Patient ID: Nancy Acevedo, female    DOB: August 29, 1960, 53 y.o.   MRN: [id number]  HPI  Nancy Acevedo is a 53 y/o female who presents today for body aches. All over body - mainly at hip on left side going down. Wednesday last week started having difficulty walking on left was seen in ED. Imaging was negative. Diagnosed with foot pain and given some pain medication. Vicodin has not improved the pain. General body aching has been going on for about a month, however foot pain is the worst currently. Denies any trauma.   Denies low back pain.   No medication adjustments have been made recently.  Review of Systems Denies: fever, chills, does have sweats at night. Respiratory: Positive for shortness of breath, denies smoking, lives with smoker; increased with activity.  Cardio: Denies chest pain/discomfort, palpitations    Objective:   Physical Exam  Constitutional: No distress.  Obese female seated in chair. Walked into the clinic with a limp and without crutches. Alert and oriented x 4. Speech is clear and appropriate, answers questions appropriately.   HENT:  Head: Normocephalic.  Cardiovascular: Normal rate, regular rhythm and normal heart sounds.   Pulmonary/Chest: Effort normal and breath sounds normal. No respiratory distress.  Musculoskeletal: She exhibits tenderness.  Obvious edema of left lower extremity. No discoloration noted. Slightly warm to the touch. Dorsalis pedis and posterior tibial pulses intact. Decreased range of motion in all directions.   Skin: Skin is warm and dry.    BP 150/90  Pulse 83  Temp(Src) 98.6 F (37 C) (Oral)  Ht 5\' 4"  (1.626 m)  Wt 249 lb 1.9 oz (113 kg)  BMI 42.74 kg/m2  SpO2 97% Nursing notes and vital signs reviewed.       Assessment & Plan:

## 2013-10-21 NOTE — Progress Notes (Signed)
Pre visit review using our clinic review tool, if applicable. No additional management support is needed unless otherwise documented below in the visit note. 

## 2013-10-21 NOTE — Telephone Encounter (Signed)
Please call patient and let her know her Uric Acid level is slightly elevated at 7.7. Gout may still be a possibility. Treatment remains the same with the anti-inflammatories that were prescribed.

## 2013-10-21 NOTE — Telephone Encounter (Signed)
Pt.notified

## 2013-10-21 NOTE — Patient Instructions (Signed)
Thank you for choosing [facility].  Summary/Instructions:   Please stop at the lab on the way out.   Start the Mobic once daily to help with inflammation.  Rest off of the foot for at least a day - please use the crutches they gave you.  Ice 2-3x per day at minimum and keep it wrapped and elevated as much as possible.   Work on moving your ankle in all directions to maintain your range of motion.  Follow up if the pain is not improved by the end of the week and we can arrange for you to be seen in sports medicine.   Follow-up with Dr. Asa Lente if shortness of breath continues or worsens.    Thank you for enrolling in [facility]. Please follow the instructions below to securely access your online medical record. MyChart allows you to send messages to your doctor, view your test results, renew your prescriptions, schedule appointments, and more.  How Do I Sign Up? 1. In your Internet browser, go to [email or web address]. 2. Click on the New  User? link in the Sign In box.  3. Enter your MyChart Access Code exactly as it appears below. You will not need to use this code after you have completed the sign-up process. If you do not sign up before the expiration date, you must request a new code. MyChart Access Code: [email or web address] Expires: 12/02/2013  1:24 PM  4. Enter the last four digits of your Social Security Number (xxxx) and Date of Birth (mm/dd/yyyy) as indicated and click Next. You will be taken to the next sign-up page. 5. Create a MyChart ID. This will be your MyChart login ID and cannot be changed, so think of one that is secure and easy to remember. 6. Create a MyChart password. You can change your password at any time. 7. Enter your Password Reset Question and Answer and click Next. This can be used at a later time if you forget your password.  8. Select your communication preference, and if applicable enter your e-mail address. You will receive e-mail  notification when new information is available in MyChart by choosing to receive e-mail notifications and filling in your e-mail. 9. Click Sign In. You can now view your medical record.   Additional Information If you have questions, you can email [email or web address] or call [phone] to talk to our [place] staff. Remember, MyChart is NOT to be used for urgent needs. For medical emergencies, dial 911.

## 2013-10-21 NOTE — Assessment & Plan Note (Addendum)
Foot sprain vs. Stress fracture. Obtain uric acid to rule out gout. Start mobic daily. Continue cold therapy 2-3x per day and keep the foot elevated. Please wear ace wrap. If symptoms are not improving will refer to sports medicine/orthopedics.

## 2013-12-03 ENCOUNTER — Ambulatory Visit: Payer: BC Managed Care – PPO | Admitting: Internal Medicine

## 2014-01-13 ENCOUNTER — Emergency Department (HOSPITAL_BASED_OUTPATIENT_CLINIC_OR_DEPARTMENT_OTHER): Payer: BC Managed Care – PPO

## 2014-01-13 ENCOUNTER — Emergency Department (HOSPITAL_BASED_OUTPATIENT_CLINIC_OR_DEPARTMENT_OTHER)
Admission: EM | Admit: 2014-01-13 | Discharge: 2014-01-13 | Disposition: A | Discharge status: Home / Self Care | Payer: BC Managed Care – PPO | Attending: Emergency Medicine | Admitting: Emergency Medicine

## 2014-01-13 ENCOUNTER — Encounter (HOSPITAL_BASED_OUTPATIENT_CLINIC_OR_DEPARTMENT_OTHER): Payer: Self-pay

## 2014-01-13 DIAGNOSIS — Y92410 Unspecified street and highway as the place of occurrence of the external cause: Secondary | ICD-10-CM | POA: Diagnosis not present

## 2014-01-13 DIAGNOSIS — S199XXA Unspecified injury of neck, initial encounter: Secondary | ICD-10-CM | POA: Diagnosis present

## 2014-01-13 DIAGNOSIS — Z79899 Other long term (current) drug therapy: Secondary | ICD-10-CM | POA: Insufficient documentation

## 2014-01-13 DIAGNOSIS — S29091A Other injury of muscle and tendon of front wall of thorax, initial encounter: Secondary | ICD-10-CM | POA: Diagnosis not present

## 2014-01-13 DIAGNOSIS — Z8619 Personal history of other infectious and parasitic diseases: Secondary | ICD-10-CM | POA: Insufficient documentation

## 2014-01-13 DIAGNOSIS — I10 Essential (primary) hypertension: Secondary | ICD-10-CM | POA: Insufficient documentation

## 2014-01-13 DIAGNOSIS — Y998 Other external cause status: Secondary | ICD-10-CM | POA: Insufficient documentation

## 2014-01-13 DIAGNOSIS — G4733 Obstructive sleep apnea (adult) (pediatric): Secondary | ICD-10-CM | POA: Diagnosis not present

## 2014-01-13 DIAGNOSIS — Z791 Long term (current) use of non-steroidal anti-inflammatories (NSAID): Secondary | ICD-10-CM | POA: Diagnosis not present

## 2014-01-13 DIAGNOSIS — S161XXA Strain of muscle, fascia and tendon at neck level, initial encounter: Secondary | ICD-10-CM | POA: Diagnosis not present

## 2014-01-13 DIAGNOSIS — Y9389 Activity, other specified: Secondary | ICD-10-CM | POA: Insufficient documentation

## 2014-01-13 DIAGNOSIS — R0789 Other chest pain: Secondary | ICD-10-CM

## 2014-01-13 DIAGNOSIS — E119 Type 2 diabetes mellitus without complications: Secondary | ICD-10-CM | POA: Insufficient documentation

## 2014-01-13 MED ORDER — HYDROCODONE-ACETAMINOPHEN 5-325 MG PO TABS: 1.0000 | ORAL_TABLET | ORAL | Status: DC | PRN

## 2014-01-13 MED ORDER — CYCLOBENZAPRINE HCL 5 MG PO TABS: 5.0000 mg | ORAL_TABLET | Freq: Two times a day (BID) | ORAL | Status: DC | PRN

## 2014-01-13 NOTE — ED Notes (Signed)
Patient transported to X-ray 
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
## 2014-01-13 NOTE — ED Notes (Signed)
Pain to neck, chest and back x 1 hour
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
## 2014-01-17 HISTORY — PX: COLONOSCOPY: SHX174

## 2014-02-06 ENCOUNTER — Ambulatory Visit: Payer: BC Managed Care – PPO | Admitting: Internal Medicine

## 2014-03-24 LAB — HM PAP SMEAR: HM Pap smear: NORMAL

## 2014-04-08 ENCOUNTER — Encounter: Payer: Self-pay | Admitting: Internal Medicine

## 2014-04-08 ENCOUNTER — Other Ambulatory Visit (INDEPENDENT_AMBULATORY_CARE_PROVIDER_SITE_OTHER): Payer: BC Managed Care – PPO

## 2014-04-08 ENCOUNTER — Ambulatory Visit (INDEPENDENT_AMBULATORY_CARE_PROVIDER_SITE_OTHER): Payer: BC Managed Care – PPO | Admitting: Internal Medicine

## 2014-04-08 VITALS — BP 170/106 | HR 73 | Temp 98.3°F | Wt 231.8 lb

## 2014-04-08 DIAGNOSIS — E119 Type 2 diabetes mellitus without complications: Secondary | ICD-10-CM

## 2014-04-08 DIAGNOSIS — I10 Essential (primary) hypertension: Secondary | ICD-10-CM

## 2014-04-08 DIAGNOSIS — Z23 Encounter for immunization: Secondary | ICD-10-CM

## 2014-04-08 DIAGNOSIS — N63 Unspecified lump in breast: Secondary | ICD-10-CM | POA: Diagnosis not present

## 2014-04-08 DIAGNOSIS — N6310 Unspecified lump in the right breast, unspecified quadrant: Secondary | ICD-10-CM

## 2014-04-08 DIAGNOSIS — Z0000 Encounter for general adult medical examination without abnormal findings: Secondary | ICD-10-CM

## 2014-04-08 DIAGNOSIS — Z1211 Encounter for screening for malignant neoplasm of colon: Secondary | ICD-10-CM

## 2014-04-08 DIAGNOSIS — E669 Obesity, unspecified: Secondary | ICD-10-CM

## 2014-04-08 LAB — CBC WITH DIFFERENTIAL/PLATELET
Basophils Absolute: 0 10*3/uL (ref 0.0–0.1)
Basophils Relative: 0.1 % (ref 0.0–3.0)
EOS PCT: 2 % (ref 0.0–5.0)
Eosinophils Absolute: 0.1 10*3/uL (ref 0.0–0.7)
HEMATOCRIT: 36.2 % (ref 36.0–46.0)
HEMOGLOBIN: 12.2 g/dL (ref 12.0–15.0)
Lymphocytes Relative: 32.3 % (ref 12.0–46.0)
Lymphs Abs: 1.5 10*3/uL (ref 0.7–4.0)
MCHC: 33.6 g/dL (ref 30.0–36.0)
MCV: 86.1 fl (ref 78.0–100.0)
MONOS PCT: 9.3 % (ref 3.0–12.0)
Monocytes Absolute: 0.4 10*3/uL (ref 0.1–1.0)
Neutro Abs: 2.6 10*3/uL (ref 1.4–7.7)
Neutrophils Relative %: 56.3 % (ref 43.0–77.0)
PLATELETS: 316 10*3/uL (ref 150.0–400.0)
RBC: 4.2 Mil/uL (ref 3.87–5.11)
RDW: 14 % (ref 11.5–15.5)
WBC: 4.6 10*3/uL (ref 4.0–10.5)

## 2014-04-08 LAB — LIPID PANEL
CHOL/HDL RATIO: 4
Cholesterol: 178 mg/dL (ref 0–200)
HDL: 43.5 mg/dL (ref >39.00)
LDL Cholesterol: 109 mg/dL — ABNORMAL HIGH (ref 0–99)
NonHDL: 134.5
Triglycerides: 130 mg/dL (ref 0.0–149.0)
VLDL: 26 mg/dL (ref 0.0–40.0)

## 2014-04-08 LAB — MICROALBUMIN / CREATININE URINE RATIO
Creatinine,U: 119.3 mg/dL
Microalb Creat Ratio: 6.2 mg/g (ref 0.0–30.0)
Microalb, Ur: 7.4 mg/dL — ABNORMAL HIGH (ref 0.0–1.9)

## 2014-04-08 LAB — HEPATIC FUNCTION PANEL
ALK PHOS: 56 U/L (ref 39–117)
ALT: 36 U/L — ABNORMAL HIGH (ref 0–35)
AST: 40 U/L — ABNORMAL HIGH (ref 0–37)
Albumin: 4.4 g/dL (ref 3.5–5.2)
Bilirubin, Direct: 0.1 mg/dL (ref 0.0–0.3)
TOTAL PROTEIN: 8.2 g/dL (ref 6.0–8.3)
Total Bilirubin: 0.4 mg/dL (ref 0.2–1.2)

## 2014-04-08 LAB — BASIC METABOLIC PANEL
BUN: 15 mg/dL (ref 6–23)
CO2: 29 mEq/L (ref 19–32)
Calcium: 9.6 mg/dL (ref 8.4–10.5)
Chloride: 105 mEq/L (ref 96–112)
Creatinine, Ser: 0.98 mg/dL (ref 0.40–1.20)
GFR: 76.53 mL/min (ref >60.00)
GLUCOSE: 90 mg/dL (ref 70–99)
Potassium: 3.6 mEq/L (ref 3.5–5.1)
Sodium: 141 mEq/L (ref 135–145)

## 2014-04-08 LAB — HEMOGLOBIN A1C: HEMOGLOBIN A1C: 6.3 % (ref 4.6–6.5)

## 2014-04-08 LAB — TSH: TSH: 0.67 u[IU]/mL (ref 0.35–4.50)

## 2014-04-08 MED ORDER — CLONIDINE HCL 0.1 MG PO TABS: 0.1000 mg | ORAL_TABLET | Freq: Two times a day (BID) | ORAL | Status: DC

## 2014-04-08 MED ORDER — METFORMIN HCL ER 500 MG PO TB24: 500.0000 mg | ORAL_TABLET | Freq: Every day | ORAL | Status: DC

## 2014-04-08 MED ORDER — METOPROLOL SUCCINATE ER 50 MG PO TB24: 50.0000 mg | ORAL_TABLET | Freq: Every day | ORAL | Status: DC

## 2014-04-08 MED ORDER — OLMESARTAN-AMLODIPINE-HCTZ 40-10-25 MG PO TABS: 1.0000 | ORAL_TABLET | Freq: Every day | ORAL | Status: DC

## 2014-04-08 NOTE — Assessment & Plan Note (Signed)
Control complicated by noncompliance with rx meds resumed Tribenzor 11/2010 - refill now rx'd extended release beta blocker - refill now And started clonidine 11/2012 and titrated spring 2015 There have been some probable medication, dietary and lifestyle compliance issues here. I have discussed with her the great importance of following the treatment plan exactly as directed in order to achieve a good medical outcome. Patient to notify us if blood pressure remains greater than 140/85  BP Readings from Last 3 Encounters:  04/08/14 170/106  01/13/14 219/115  10/21/13 150/90

## 2014-04-08 NOTE — Assessment & Plan Note (Signed)
Hx pregnancy induced hyperglycemia 1993 but "not diabetes mellitus after 3 hour test -GTT" New dx 01/2011 - started metformin, but variable compliance with same s/p nutrition eval for same recheck a1c q3-69mo, adjust meds prn Again advised on need for healthy diet and exercise habits for weight reduction to control risk of DM dz Lab Results  Component Value Date   HGBA1C 6.0 04/09/2013

## 2014-04-08 NOTE — Patient Instructions (Addendum)
It was good to see you today.  We have reviewed your prior records including labs and tests today  Health Maintenance reviewed - Prevnar pneumonia vaccine updated today. Consider the flu shot annually. All other recommended immunizations and age-appropriate screenings are up-to-date.  Test(s) ordered today. Your results will be released to [facility] (or called to you) after review, usually within 72hours after test completion. If any changes need to be made, you will be notified at that same time.  We'll make referral for screening colonoscopy because of age over 40 and for diagnostic mammogram evaluation because of right breast mass. Our office will call for these appointments once scheduled  Medications reviewed and updated, no changes recommended at this time. Please take all medications as prescribed  Please schedule followup in 6 months for diabetes mellitus exam and labs, call sooner if problems.  Health Maintenance Adopting a healthy lifestyle and getting preventive care can go a long way to promote health and wellness. Talk with your health care provider about what schedule of regular examinations is right for you. This is a good chance for you to check in with your provider about disease prevention and staying healthy. In between checkups, there are plenty of things you can do on your own. Experts have done a lot of research about which lifestyle changes and preventive measures are most likely to keep you healthy. Ask your health care provider for more information. WEIGHT AND DIET  Eat a healthy diet  Be sure to include plenty of vegetables, fruits, low-fat dairy products, and lean protein.  Do not eat a lot of foods high in solid fats, added sugars, or salt.  Get regular exercise. This is one of the most important things you can do for your health.  Most adults should exercise for at least 150 minutes each week. The exercise should increase your heart rate and make you sweat  (moderate-intensity exercise).  Most adults should also do strengthening exercises at least twice a week. This is in addition to the moderate-intensity exercise.  Maintain a healthy weight  Body mass index (BMI) is a measurement that can be used to identify possible weight problems. It estimates body fat based on height and weight. Your health care provider can help determine your BMI and help you achieve or maintain a healthy weight.  For females 66 years of age and older:   A BMI below 18.5 is considered underweight.  A BMI of 18.5 to 24.9 is normal.  A BMI of 25 to 29.9 is considered overweight.  A BMI of 30 and above is considered obese.  Watch levels of cholesterol and blood lipids  You should start having your blood tested for lipids and cholesterol at 53 years of age, then have this test every 5 years.  You may need to have your cholesterol levels checked more often if:  Your lipid or cholesterol levels are high.  You are older than 53 years of age.  You are at high risk for heart disease.  CANCER SCREENING   Lung Cancer  Lung cancer screening is recommended for adults 35-37 years old who are at high risk for lung cancer because of a history of smoking.  A yearly low-dose CT scan of the lungs is recommended for people who:  Currently smoke.  Have quit within the past 15 years.  Have at least a 30-pack-year history of smoking. A pack year is smoking an average of one pack of cigarettes a day for 1 year.  Yearly screening should continue until it has been 15 years since you quit.  Yearly screening should stop if you develop a health problem that would prevent you from having lung cancer treatment.  Breast Cancer  Practice breast self-awareness. This means understanding how your breasts normally appear and feel.  It also means doing regular breast self-exams. Let your health care provider know about any changes, no matter how small.  If you are in your 20s  or 30s, you should have a clinical breast exam (CBE) by a health care provider every 1-3 years as part of a regular health exam.  If you are 40 or older, have a CBE every year. Also consider having a breast X-ray (mammogram) every year.  If you have a family history of breast cancer, talk to your health care provider about genetic screening.  If you are at high risk for breast cancer, talk to your health care provider about having an MRI and a mammogram every year.  Breast cancer gene (BRCA) assessment is recommended for women who have family members with BRCA-related cancers. BRCA-related cancers include:  Breast.  Ovarian.  Tubal.  Peritoneal cancers.  Results of the assessment will determine the need for genetic counseling and BRCA1 and BRCA2 testing. Cervical Cancer Routine pelvic examinations to screen for cervical cancer are no longer recommended for nonpregnant women who are considered low risk for cancer of the pelvic organs (ovaries, uterus, and vagina) and who do not have symptoms. A pelvic examination may be necessary if you have symptoms including those associated with pelvic infections. Ask your health care provider if a screening pelvic exam is right for you.   The Pap test is the screening test for cervical cancer for women who are considered at risk.  If you had a hysterectomy for a problem that was not cancer or a condition that could lead to cancer, then you no longer need Pap tests.  If you are older than 65 years, and you have had normal Pap tests for the past 10 years, you no longer need to have Pap tests.  If you have had past treatment for cervical cancer or a condition that could lead to cancer, you need Pap tests and screening for cancer for at least 20 years after your treatment.  If you no longer get a Pap test, assess your risk factors if they change (such as having a new sexual partner). This can affect whether you should start being screened again.  Some  women have medical problems that increase their chance of getting cervical cancer. If this is the case for you, your health care provider may recommend more frequent screening and Pap tests.  The human papillomavirus (HPV) test is another test that may be used for cervical cancer screening. The HPV test looks for the virus that can cause cell changes in the cervix. The cells collected during the Pap test can be tested for HPV.  The HPV test can be used to screen women 30 years of age and older. Getting tested for HPV can extend the interval between normal Pap tests from three to five years.  An HPV test also should be used to screen women of any age who have unclear Pap test results.  After 53 years of age, women should have HPV testing as often as Pap tests.  Colorectal Cancer  This type of cancer can be detected and often prevented.  Routine colorectal cancer screening usually begins at 53 years of age and continues   through 53 years of age.  Your health care provider may recommend screening at an earlier age if you have risk factors for colon cancer.  Your health care provider may also recommend using home test kits to check for hidden blood in the stool.  A small camera at the end of a tube can be used to examine your colon directly (sigmoidoscopy or colonoscopy). This is done to check for the earliest forms of colorectal cancer.  Routine screening usually begins at age 50.  Direct examination of the colon should be repeated every 5-10 years through 53 years of age. However, you may need to be screened more often if early forms of precancerous polyps or small growths are found. Skin Cancer  Check your skin from head to toe regularly.  Tell your health care provider about any new moles or changes in moles, especially if there is a change in a mole's shape or color.  Also tell your health care provider if you have a mole that is larger than the size of a pencil eraser.  Always use  sunscreen. Apply sunscreen liberally and repeatedly throughout the day.  Protect yourself by wearing long sleeves, pants, a wide-brimmed hat, and sunglasses whenever you are outside. HEART DISEASE, DIABETES, AND HIGH BLOOD PRESSURE   Have your blood pressure checked at least every 1-2 years. High blood pressure causes heart disease and increases the risk of stroke.  If you are between 55 years and 79 years old, ask your health care provider if you should take aspirin to prevent strokes.  Have regular diabetes screenings. This involves taking a blood sample to check your fasting blood sugar level.  If you are at a normal weight and have a low risk for diabetes, have this test once every three years after 53 years of age.  If you are overweight and have a high risk for diabetes, consider being tested at a younger age or more often. PREVENTING INFECTION  Hepatitis B  If you have a higher risk for hepatitis B, you should be screened for this virus. You are considered at high risk for hepatitis B if:  You were born in a country where hepatitis B is common. Ask your health care provider which countries are considered high risk.  Your parents were born in a high-risk country, and you have not been immunized against hepatitis B (hepatitis B vaccine).  You have HIV or AIDS.  You use needles to inject street drugs.  You live with someone who has hepatitis B.  You have had sex with someone who has hepatitis B.  You get hemodialysis treatment.  You take certain medicines for conditions, including cancer, organ transplantation, and autoimmune conditions. Hepatitis C  Blood testing is recommended for:  Everyone born from 1945 through 1965.  Anyone with known risk factors for hepatitis C. Sexually transmitted infections (STIs)  You should be screened for sexually transmitted infections (STIs) including gonorrhea and chlamydia if:  You are sexually active and are younger than 53 years of  age.  You are older than 53 years of age and your health care provider tells you that you are at risk for this type of infection.  Your sexual activity has changed since you were last screened and you are at an increased risk for chlamydia or gonorrhea. Ask your health care provider if you are at risk.  If you do not have HIV, but are at risk, it may be recommended that you take a prescription medicine daily   to prevent HIV infection. This is called pre-exposure prophylaxis (PrEP). You are considered at risk if:  You are sexually active and do not regularly use condoms or know the HIV status of your partner(s).  You take drugs by injection.  You are sexually active with a partner who has HIV. Talk with your health care provider about whether you are at high risk of being infected with HIV. If you choose to begin PrEP, you should first be tested for HIV. You should then be tested every 3 months for as long as you are taking PrEP.  PREGNANCY   If you are premenopausal and you may become pregnant, ask your health care provider about preconception counseling.  If you may become pregnant, take 400 to 800 micrograms (mcg) of folic acid every day.  If you want to prevent pregnancy, talk to your health care provider about birth control (contraception). OSTEOPOROSIS AND MENOPAUSE   Osteoporosis is a disease in which the bones lose minerals and strength with aging. This can result in serious bone fractures. Your risk for osteoporosis can be identified using a bone density scan.  If you are 28 years of age or older, or if you are at risk for osteoporosis and fractures, ask your health care provider if you should be screened.  Ask your health care provider whether you should take a calcium or vitamin D supplement to lower your risk for osteoporosis.  Menopause may have certain physical symptoms and risks.  Hormone replacement therapy may reduce some of these symptoms and risks. Talk to your health  care provider about whether hormone replacement therapy is right for you.  HOME CARE INSTRUCTIONS   Schedule regular health, dental, and eye exams.  Stay current with your immunizations.   Do not use any tobacco products including cigarettes, chewing tobacco, or electronic cigarettes.  If you are pregnant, do not drink alcohol.  If you are breastfeeding, limit how much and how often you drink alcohol.  Limit alcohol intake to no more than 1 drink per day for nonpregnant women. One drink equals 12 ounces of beer, 5 ounces of wine, or 1 ounces of hard liquor.  Do not use street drugs.  Do not share needles.  Ask your health care provider for help if you need support or information about quitting drugs.  Tell your health care provider if you often feel depressed.  Tell your health care provider if you have ever been abused or do not feel safe at home. Document Released: 07/19/2010 Document Revised: 05/20/2013 Document Reviewed: 12/05/2012 [facility] Patient Information 2015 [place], [place]. This information is not intended to replace advice given to you by your health care provider. Make sure you discuss any questions you have with your health care provider. Diabetes and Standards of Medical Care Diabetes is complicated. You may find that your diabetes team includes a dietitian, nurse, diabetes educator, eye doctor, and more. To help everyone know what is going on and to help you get the care you deserve, the following schedule of care was developed to help keep you on track. Below are the tests, exams, vaccines, medicines, education, and plans you will need. HbA1c test This test shows how well you have controlled your glucose over the past 2-3 months. It is used to see if your diabetes management plan needs to be adjusted.   It is performed at least 2 times a year if you are meeting treatment goals.  It is performed 4 times a year if therapy has  changed or if you are not meeting  treatment goals. Blood pressure test  This test is performed at every routine medical visit. The goal is less than 140/90 mm Hg for most people, but 130/80 mm Hg in some cases. Ask your health care provider about your goal. Dental exam  Follow up with the dentist regularly. Eye exam  If you are diagnosed with type 1 diabetes as a child, get an exam upon reaching the age of 38 years or older and have had diabetes for 3-5 years. Yearly eye exams are recommended after that initial eye exam.  If you are diagnosed with type 1 diabetes as an adult, get an exam within 5 years of diagnosis and then yearly.  If you are diagnosed with type 2 diabetes, get an exam as soon as possible after the diagnosis and then yearly. Foot care exam  Visual foot exams are performed at every routine medical visit. The exams check for cuts, injuries, or other problems with the feet.  A comprehensive foot exam should be done yearly. This includes visual inspection as well as assessing foot pulses and testing for loss of sensation.  Check your feet nightly for cuts, injuries, or other problems with your feet. Tell your health care provider if anything is not healing. Kidney function test (urine microalbumin)  This test is performed once a year.  Type 1 diabetes: The first test is performed 5 years after diagnosis.  Type 2 diabetes: The first test is performed at the time of diagnosis.  A serum creatinine and estimated glomerular filtration rate (eGFR) test is done once a year to assess the level of chronic kidney disease (CKD), if present. Lipid profile (cholesterol, HDL, LDL, triglycerides)  Performed every 5 years for most people.  The goal for LDL is less than 100 mg/dL. If you are at high risk, the goal is less than 70 mg/dL.  The goal for HDL is 40 mg/dL-50 mg/dL for men and 50 mg/dL-60 mg/dL for women. An HDL cholesterol of 60 mg/dL or higher gives some protection against heart disease.  The goal for  triglycerides is less than 150 mg/dL. Influenza vaccine, pneumococcal vaccine, and hepatitis B vaccine  The influenza vaccine is recommended yearly.  It is recommended that people with diabetes who are over 68 years old get the pneumonia vaccine. In some cases, two separate shots may be given. Ask your health care provider if your pneumonia vaccination is up to date.  The hepatitis B vaccine is also recommended for adults with diabetes. Diabetes self-management education  Education is recommended at diagnosis and ongoing as needed. Treatment plan  Your treatment plan is reviewed at every medical visit. Document Released: 10/31/2008 Document Revised: 05/20/2013 Document Reviewed: 06/05/2012 [facility] Patient Information 2015 [place], [place]. This information is not intended to replace advice given to you by your health care provider. Make sure you discuss any questions you have with your health care provider.

## 2014-04-08 NOTE — Assessment & Plan Note (Signed)
Wt Readings from Last 3 Encounters:  04/08/14 231 lb 12 oz (105.121 kg)  01/13/14 250 lb (113.399 kg)  10/21/13 249 lb 1.9 oz (113 kg)   The patient is asked to make an attempt to improve diet and exercise patterns to aid in medical management of this problem. Following with [employer]' program since 01/2014 Prev unable to consider bariatric intervention due to cost Trial phentermine x 30d 08/2011 -
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
## 2014-04-08 NOTE — Progress Notes (Signed)
Pre visit review using our clinic review tool, if applicable. No additional management support is needed unless otherwise documented below in the visit note. 

## 2014-04-09 ENCOUNTER — Encounter: Payer: Self-pay | Admitting: Gastroenterology

## 2014-04-22 ENCOUNTER — Other Ambulatory Visit: Payer: Self-pay | Admitting: Internal Medicine

## 2014-04-22 DIAGNOSIS — N6310 Unspecified lump in the right breast, unspecified quadrant: Secondary | ICD-10-CM

## 2014-05-02 ENCOUNTER — Other Ambulatory Visit: Payer: Self-pay | Admitting: Internal Medicine

## 2014-05-02 ENCOUNTER — Ambulatory Visit
Admission: RE | Admit: 2014-05-02 | Discharge: 2014-05-02 | Disposition: A | Discharge status: Home / Self Care | Payer: BC Managed Care – PPO | Source: Ambulatory Visit | Attending: Internal Medicine | Admitting: Internal Medicine

## 2014-05-02 DIAGNOSIS — N6310 Unspecified lump in the right breast, unspecified quadrant: Secondary | ICD-10-CM

## 2014-05-19 LAB — HM DIABETES EYE EXAM

## 2014-05-26 ENCOUNTER — Ambulatory Visit (AMBULATORY_SURGERY_CENTER): Payer: Self-pay | Admitting: *Deleted

## 2014-05-26 VITALS — Ht 64.0 in | Wt 226.0 lb

## 2014-05-26 DIAGNOSIS — Z1211 Encounter for screening for malignant neoplasm of colon: Secondary | ICD-10-CM

## 2014-05-26 NOTE — Progress Notes (Signed)
No egg or soy allergy No issues with past sedation-does have a hard time waking up  No home 02 No diet pills emmi video to e mail Pt states she cannot pay 80-100$ for movi prep , pt given miralax as per protocol

## 2014-05-28 ENCOUNTER — Encounter: Payer: Self-pay | Admitting: Internal Medicine

## 2014-05-29 ENCOUNTER — Encounter: Payer: Self-pay | Admitting: Gastroenterology

## 2014-06-09 ENCOUNTER — Ambulatory Visit (AMBULATORY_SURGERY_CENTER): Payer: BC Managed Care – PPO | Admitting: Gastroenterology

## 2014-06-09 ENCOUNTER — Encounter: Payer: Self-pay | Admitting: Gastroenterology

## 2014-06-09 VITALS — BP 129/64 | HR 76 | Temp 97.8°F | Resp 18 | Ht 64.0 in | Wt 226.0 lb

## 2014-06-09 DIAGNOSIS — K5730 Diverticulosis of large intestine without perforation or abscess without bleeding: Secondary | ICD-10-CM

## 2014-06-09 DIAGNOSIS — Z1211 Encounter for screening for malignant neoplasm of colon: Secondary | ICD-10-CM | POA: Diagnosis not present

## 2014-06-09 LAB — GLUCOSE, CAPILLARY
GLUCOSE-CAPILLARY: 69 mg/dL (ref 65–99)
Glucose-Capillary: 83 mg/dL (ref 65–99)

## 2014-06-09 MED ORDER — SODIUM CHLORIDE 0.9 % IV SOLN: 500.0000 mL | INTRAVENOUS | Status: DC

## 2014-06-09 NOTE — Patient Instructions (Signed)
Discharge instructions given. Handouts on diverticulosis and a high fiber diet Resume previous medications. YOU HAD AN ENDOSCOPIC PROCEDURE TODAY AT THE [facility]:   Refer to the procedure report that was given to you for any specific questions about what was found during the examination.  If the procedure report does not answer your questions, please call your gastroenterologist to clarify.  If you requested that your care partner not be given the details of your procedure findings, then the procedure report has been included in a sealed envelope for you to review at your convenience later.  YOU SHOULD EXPECT: Some feelings of bloating in the abdomen. Passage of more gas than usual.  Walking can help get rid of the air that was put into your GI tract during the procedure and reduce the bloating. If you had a lower endoscopy (such as a colonoscopy or flexible sigmoidoscopy) you may notice spotting of blood in your stool or on the toilet paper. If you underwent a bowel prep for your procedure, you may not have a normal bowel movement for a few days.  Please Note:  You might notice some irritation and congestion in your nose or some drainage.  This is from the oxygen used during your procedure.  There is no need for concern and it should clear up in a day or so.  SYMPTOMS TO REPORT IMMEDIATELY:   Following lower endoscopy (colonoscopy or flexible sigmoidoscopy):  Excessive amounts of blood in the stool  Significant tenderness or worsening of abdominal pains  Swelling of the abdomen that is new, acute  Fever of 100F or higher   For urgent or emergent issues, a gastroenterologist can be reached at any hour by calling [phone].   DIET: Your first meal following the procedure should be a small meal and then it is ok to progress to your normal diet. Heavy or fried foods are harder to digest and may make you feel nauseous or bloated.  Likewise, meals heavy in dairy and vegetables  can increase bloating.  Drink plenty of fluids but you should avoid alcoholic beverages for 24 hours.  ACTIVITY:  You should plan to take it easy for the rest of today and you should NOT DRIVE or use heavy machinery until tomorrow (because of the sedation medicines used during the test).    FOLLOW UP: Our staff will call the number listed on your records the next business day following your procedure to check on you and address any questions or concerns that you may have regarding the information given to you following your procedure. If we do not reach you, we will leave a message.  However, if you are feeling well and you are not experiencing any problems, there is no need to return our call.  We will assume that you have returned to your regular daily activities without incident.  If any biopsies were taken you will be contacted by phone or by letter within the next 1-3 weeks.  Please call us at [phone] if you have not heard about the biopsies in 3 weeks.    SIGNATURES/CONFIDENTIALITY: You and/or your care partner have signed paperwork which will be entered into your electronic medical record.  These signatures attest to the fact that that the information above on your After Visit Summary has been reviewed and is understood.  Full responsibility of the confidentiality of this discharge information lies with you and/or your care-partner. 

## 2014-06-09 NOTE — Progress Notes (Signed)
Report to PACU, RN, vss, BBS= Clear.  

## 2014-06-09 NOTE — Op Note (Signed)
[facility]  [address] [place], [address]   COLONOSCOPY PROCEDURE REPORT  PATIENT: Nancy Acevedo, Nancy Acevedo  MR#: [id number] BIRTHDATE: June 29, 1960 , 53  yrs. old GENDER: female ENDOSCOPIST: Milus Banister, MD REFERRED VV:[id number] Asa Lente, M.D. PROCEDURE DATE:  06/09/2014 PROCEDURE:   Colonoscopy, screening First Screening Colonoscopy - Avg.  risk and is 50 yrs.  old or older Yes.  Prior Negative Screening - Now for repeat screening. N/A  History of Adenoma - Now for follow-up colonoscopy & has been > or = to 3 yrs.  N/A  Recommend repeat exam, <10 yrs? No ASA CLASS:   Class II INDICATIONS:Screening for colonic neoplasia and Colorectal Neoplasm Risk Assessment for this procedure is average risk. MEDICATIONS: Monitored anesthesia care and Propofol 200 mg IV  DESCRIPTION OF PROCEDURE:   After the risks benefits and alternatives of the procedure were thoroughly explained, informed consent was obtained.  The digital rectal exam revealed no abnormalities of the rectum.   The LB CF-H180AL Loaner [id number] endoscope was introduced through the anus and advanced to the cecum, which was identified by both the appendix and ileocecal valve. No adverse events experienced.   The quality of the prep was excellent.  The instrument was then slowly withdrawn as the colon was fully examined. Estimated blood loss is zero unless otherwise noted in this procedure report.   COLON FINDINGS: There was mild diverticulosis noted in the left colon.   The examination was otherwise normal.  Retroflexed views revealed no abnormalities. The time to cecum = 1.3 Withdrawal time = 6.7   The scope was withdrawn and the procedure completed. COMPLICATIONS: There were no immediate complications.  ENDOSCOPIC IMPRESSION: 1.   Mild diverticulosis was noted in the left colon 2.   The examination was otherwise normal  RECOMMENDATIONS: You should continue to follow colorectal cancer screening  guidelines for "routine risk" patients with a repeat colonoscopy in 10 years.   eSigned:  Milus Banister, MD 06/09/2014 8:47 AM

## 2014-06-10 ENCOUNTER — Telehealth: Payer: Self-pay

## 2014-06-10 NOTE — Telephone Encounter (Signed)
  Follow up Call-  Call back number 06/09/2014  Post procedure Call Back phone  # [phone]  Permission to leave phone message Yes     Patient questions:  Do you have a fever, pain , or abdominal swelling? No. Pain Score  0 *  Have you tolerated food without any problems? Yes.    Have you been able to return to your normal activities? Yes.    Do you have any questions about your discharge instructions: Diet   No. Medications  No. Follow up visit  No.  Do you have questions or concerns about your Care? No.  Actions: * If pain score is 4 or above: No action needed, pain <4.

## 2014-07-08 ENCOUNTER — Telehealth: Payer: Self-pay | Admitting: Internal Medicine

## 2014-07-08 MED ORDER — OLMESARTAN-AMLODIPINE-HCTZ 40-10-25 MG PO TABS: 1.0000 | ORAL_TABLET | Freq: Every day | ORAL | Status: DC

## 2014-07-08 MED ORDER — METOPROLOL SUCCINATE ER 50 MG PO TB24: 50.0000 mg | ORAL_TABLET | Freq: Every day | ORAL | Status: DC

## 2014-07-08 MED ORDER — CLONIDINE HCL 0.1 MG PO TABS: 0.1000 mg | ORAL_TABLET | Freq: Two times a day (BID) | ORAL | Status: DC

## 2014-07-08 NOTE — Telephone Encounter (Signed)
Pt called in for refill on her:  Clonidine  Metoprolol Tribenzor   Please send to pharmacy on file

## 2014-07-08 NOTE — Telephone Encounter (Signed)
Notified pt refills sent to [facility]...Nancy Acevedo

## 2014-08-07 ENCOUNTER — Emergency Department (HOSPITAL_BASED_OUTPATIENT_CLINIC_OR_DEPARTMENT_OTHER)
Admission: EM | Admit: 2014-08-07 | Discharge: 2014-08-07 | Disposition: A | Discharge status: Home / Self Care | Payer: BC Managed Care – PPO | Attending: Emergency Medicine | Admitting: Emergency Medicine

## 2014-08-07 DIAGNOSIS — I10 Essential (primary) hypertension: Secondary | ICD-10-CM | POA: Diagnosis not present

## 2014-08-07 DIAGNOSIS — G4733 Obstructive sleep apnea (adult) (pediatric): Secondary | ICD-10-CM | POA: Diagnosis not present

## 2014-08-07 DIAGNOSIS — Z79899 Other long term (current) drug therapy: Secondary | ICD-10-CM | POA: Insufficient documentation

## 2014-08-07 DIAGNOSIS — H8110 Benign paroxysmal vertigo, unspecified ear: Secondary | ICD-10-CM | POA: Diagnosis not present

## 2014-08-07 DIAGNOSIS — E119 Type 2 diabetes mellitus without complications: Secondary | ICD-10-CM | POA: Diagnosis not present

## 2014-08-07 DIAGNOSIS — R05 Cough: Secondary | ICD-10-CM | POA: Diagnosis not present

## 2014-08-07 DIAGNOSIS — Z8619 Personal history of other infectious and parasitic diseases: Secondary | ICD-10-CM | POA: Diagnosis not present

## 2014-08-07 DIAGNOSIS — Z9981 Dependence on supplemental oxygen: Secondary | ICD-10-CM | POA: Diagnosis not present

## 2014-08-07 DIAGNOSIS — R42 Dizziness and giddiness: Secondary | ICD-10-CM | POA: Diagnosis present

## 2014-08-07 LAB — CBG MONITORING, ED: Glucose-Capillary: 86 mg/dL (ref 65–99)

## 2014-08-07 MED ORDER — MECLIZINE HCL 25 MG PO TABS
25.0000 mg | ORAL_TABLET | Freq: Once | ORAL | Status: AC
  Administered 2014-08-07: 25 mg via ORAL
  Filled 2014-08-07: qty 1

## 2014-08-07 MED ORDER — MECLIZINE HCL 25 MG PO TABS: 25.0000 mg | ORAL_TABLET | Freq: Three times a day (TID) | ORAL | Status: DC | PRN

## 2014-08-07 MED ORDER — ONDANSETRON HCL 4 MG PO TABS: 4.0000 mg | ORAL_TABLET | Freq: Three times a day (TID) | ORAL | Status: DC | PRN

## 2014-08-07 MED ORDER — ONDANSETRON 4 MG PO TBDP
4.0000 mg | ORAL_TABLET | Freq: Once | ORAL | Status: AC
  Administered 2014-08-07: 4 mg via ORAL
  Filled 2014-08-07: qty 1

## 2014-08-07 NOTE — ED Provider Notes (Signed)
CSN: [id number]     Arrival date & time 08/07/14  0848 History   First MD Initiated Contact with Patient 08/07/14 [phone]     Chief Complaint  Patient presents with  . Dizziness      (Consider location/radiation/quality/duration/timing/severity/associated sxs/prior Treatment) HPI Comments: Pt. Is a 54 y/o AAF with hx of HTN, DMII presents today with ongoing symptoms of dizziness described as the room spinning around her that is waxing and waning in character for the past 1.5 weeks. She says that she has had this before a little over a year ago and it resolved on its own, though she never received a diagnosis. She says the symptoms are worse when she wakes up and gets out of bed in the morning, and then worse with movement throughout the day. She has mild nausea associated with this. She denies fever, chills, peripheral numbness or tingling or weakness. She has not had any ear pain, nasal discharge or throat pain. She says she is compliant with her BP meds. She does not check her blood sugar at home, and does not take her metformin. Otherwise, she has had a slight cough over the past two weeks that is improving.   The history is provided by the patient.    Past Medical History  Diagnosis Date  . Hypertension   . Headaches, cluster   . History of hyperglycemia 1993  . Diabetes mellitus, type 2 dx 01/2011  . OSA (obstructive sleep apnea)   . EP (ectopic pregnancy)   . Candidiasis of mouth   . Sleep apnea     cpap use    Past Surgical History  Procedure Laterality Date  . Tonsillectomy and adenoidectomy  2012  . Ectopic pregnancy surgery      left fallopian tube   Family History  Problem Relation Age of Onset  . Arthritis Other   . Diabetes Mother   . Hypertension Mother   . Colon cancer Neg Hx   . Rectal cancer Neg Hx   . Stomach cancer Neg Hx    History  Substance Use Topics  . Smoking status: Never Smoker   . Smokeless tobacco: Never Used  . Alcohol Use: 0.0 oz/week    0  Standard drinks or equivalent per week     Comment: socailly but rare    OB History    No data available     Review of Systems  Constitutional: Positive for activity change. Negative for fever, chills, diaphoresis, appetite change, fatigue and unexpected weight change.  HENT: Negative.  Negative for congestion, drooling, ear discharge, ear pain, facial swelling, hearing loss, nosebleeds, postnasal drip, rhinorrhea, sinus pressure, sneezing, sore throat, tinnitus, trouble swallowing and voice change.   Eyes: Negative.  Negative for photophobia, pain and visual disturbance.  Respiratory: Positive for cough. Negative for choking, chest tightness, shortness of breath and wheezing.   Cardiovascular: Negative for chest pain, palpitations and leg swelling.  Gastrointestinal: Positive for nausea. Negative for vomiting, abdominal pain, diarrhea and constipation.  Endocrine: Negative.  Negative for polydipsia and polyuria.  Genitourinary: Negative for dysuria, frequency, hematuria, flank pain and difficulty urinating.  Musculoskeletal: Negative for myalgias and back pain.  Skin: Negative for rash and wound.  Allergic/Immunologic: Negative.  Negative for food allergies.  Neurological: Positive for dizziness. Negative for seizures, syncope, weakness, light-headedness, numbness and headaches.  Hematological: Negative.   Psychiatric/Behavioral: Negative.       Allergies  Lexapro and Spironolactone  Home Medications   Prior to Admission medications  Medication Sig Start Date End Date Taking? Authorizing Provider  cloNIDine (CATAPRES) 0.1 MG tablet Take 1 tablet (0.1 mg total) by mouth 2 (two) times daily. 07/08/14  Yes Rowe Clack, MD  metoprolol succinate (TOPROL-XL) 50 MG 24 hr tablet Take 1 tablet (50 mg total) by mouth daily. Take with or immediately following a meal. 07/08/14  Yes Rowe Clack, MD  Olmesartan-Amlodipine-HCTZ [facility]) 40-10-25 MG TABS Take 1 tablet by mouth  daily. 07/08/14  Yes Rowe Clack, MD  bisacodyl (DULCOLAX) 5 MG EC tablet Take 5 mg by mouth once. For 5-23 colon    Historical Provider, MD  meclizine (ANTIVERT) 25 MG tablet Take 1 tablet (25 mg total) by mouth 3 (three) times daily as needed for dizziness. 08/07/14   Aquilla Hacker, MD  metFORMIN (GLUCOPHAGE-XR) 500 MG 24 hr tablet Take 1 tablet (500 mg total) by mouth daily with breakfast. 04/08/14   Rowe Clack, MD  ondansetron (ZOFRAN) 4 MG tablet Take 1 tablet (4 mg total) by mouth every 8 (eight) hours as needed for nausea or vomiting. 08/07/14   York Ram Lynnel Zanetti, MD   BP 175/90 mmHg  Pulse 84  Temp(Src) 98.6 F (37 C) (Oral)  Resp 18  Ht 5\' 4"  (1.626 m)  Wt 229 lb (103.874 kg)  BMI 39.29 kg/m2  SpO2 100% Physical Exam  Constitutional: She is oriented to person, place, and time. She appears well-developed and well-nourished. No distress.  HENT:  Head: Normocephalic and atraumatic.  Right Ear: Hearing, external ear and ear canal normal. No drainage or tenderness. Tympanic membrane is not injected, not perforated, not erythematous, not retracted and not bulging. No middle ear effusion. No decreased hearing is noted.  Left Ear: Hearing, tympanic membrane, external ear and ear canal normal. No drainage or tenderness. Tympanic membrane is not injected, not perforated, not erythematous, not retracted and not bulging.  No middle ear effusion. No decreased hearing is noted.  Ears:  Nose: Nose normal.  Mouth/Throat: Oropharynx is clear and moist. No oropharyngeal exudate.  Eyes: Conjunctivae and lids are normal. Pupils are equal, round, and reactive to light. No scleral icterus. Right eye exhibits normal extraocular motion and no nystagmus. Left eye exhibits normal extraocular motion and no nystagmus. Right pupil is round and reactive. Left pupil is round and reactive. Pupils are equal.  Neck: Normal range of motion. Neck supple. No thyromegaly present.  Cardiovascular: Normal  rate, regular rhythm, normal heart sounds and intact distal pulses.  Exam reveals no gallop and no friction rub.   No murmur heard. Pulmonary/Chest: Effort normal and breath sounds normal. No respiratory distress. She has no wheezes. She has no rales. She exhibits no tenderness.  Abdominal: Soft. Bowel sounds are normal. She exhibits no distension and no mass. There is no tenderness. There is no rebound.  Musculoskeletal: Normal range of motion. She exhibits no edema or tenderness.  Neurological: She is alert and oriented to person, place, and time. She has normal strength. She is not disoriented. No cranial nerve deficit or sensory deficit. She exhibits normal muscle tone.  Attempted Dix-Hallpike with no observed nystagmus, but pt. Became very symptomatic with maneuver.   Skin: Skin is warm and dry. No rash noted. She is not diaphoretic. No erythema.  Psychiatric: She has a normal mood and affect. Her behavior is normal.    ED Course  Procedures (including critical care time) Labs Review Labs Reviewed  CBG MONITORING, ED    Imaging Review No results found.  EKG Interpretation None      MDM   Final diagnoses:  BPPV (benign paroxysmal positional vertigo), unspecified laterality    Pt. Is a 54 y/o AAF here with BPPV based on history and clinical exam. Improved with zofran and meclizine. CBG - 86. Diagnosis explained. Instructions on Epley Maneuvers given. Discharge with meclizine and zofran with instructions for close follow up with PCP for improvement. Return precautions given. Safe for discharge to home.     Aquilla Hacker, MD 08/07/14 Renner Corner, MD 08/07/14 Walker, MD 08/08/14 [phone]

## 2014-08-07 NOTE — Discharge Instructions (Signed)
Benign Positional Vertigo °Vertigo means you feel like you or your surroundings are moving when they are not. Benign positional vertigo is the most common form of vertigo. Benign means that the cause of your condition is not serious. Benign positional vertigo is more common in older adults. °CAUSES  °Benign positional vertigo is the result of an upset in the labyrinth system. This is an area in the middle ear that helps control your balance. This may be caused by a viral infection, head injury, or repetitive motion. However, often no specific cause is found. °SYMPTOMS  °Symptoms of benign positional vertigo occur when you move your head or eyes in different directions. Some of the symptoms may include: °1. Loss of balance and falls. °2. Vomiting. °3. Blurred vision. °4. Dizziness. °5. Nausea. °6. Involuntary eye movements (nystagmus). °DIAGNOSIS  °Benign positional vertigo is usually diagnosed by physical exam. If the specific cause of your benign positional vertigo is unknown, your caregiver may perform imaging tests, such as magnetic resonance imaging (MRI) or computed tomography (CT). °TREATMENT  °Your caregiver may recommend movements or procedures to correct the benign positional vertigo. Medicines such as meclizine, benzodiazepines, and medicines for nausea may be used to treat your symptoms. In rare cases, if your symptoms are caused by certain conditions that affect the inner ear, you may need surgery. °HOME CARE INSTRUCTIONS  °· Follow your caregiver's instructions. °· Move slowly. Do not make sudden body or head movements. °· Avoid driving. °· Avoid operating heavy machinery. °· Avoid performing any tasks that would be dangerous to you or others during a vertigo episode. °· Drink enough fluids to keep your urine clear or pale yellow. °SEEK IMMEDIATE MEDICAL CARE IF:  °· You develop problems with walking, weakness, numbness, or using your arms, hands, or legs. °· You have difficulty speaking. °· You develop  severe headaches. °· Your nausea or vomiting continues or gets worse. °· You develop visual changes. °· Your family or friends notice any behavioral changes. °· Your condition gets worse. °· You have a fever. °· You develop a stiff neck or sensitivity to light. °MAKE SURE YOU:  °· Understand these instructions. °· Will watch your condition. °· Will get help right away if you are not doing well or get worse. °Document Released: 10/11/2005 Document Revised: 03/28/2011 Document Reviewed: 09/23/2010 °ExitCare® Patient Information ©2015 [employer]. This information is not intended to replace advice given to you by your health care provider. Make sure you discuss any questions you have with your health care provider. ° °Epley Maneuver Self-Care °WHAT IS THE EPLEY MANEUVER? °The Epley maneuver is an exercise you can do to relieve symptoms of benign paroxysmal positional vertigo (BPPV). This condition is often just referred to as vertigo. BPPV is caused by the movement of tiny crystals (canaliths) inside your inner ear. The accumulation and movement of canaliths in your inner ear causes a sudden spinning sensation (vertigo) when you move your head to certain positions. Vertigo usually lasts about 30 seconds. BPPV usually occurs in just one ear. If you get vertigo when you lie on your left side, you probably have BPPV in your left ear. Your health care provider can tell you which ear is involved.  °BPPV may be caused by a head injury. Many people older than 50 get BPPV for unknown reasons. If you have been diagnosed with BPPV, your health care provider may teach you how to do this maneuver. BPPV is not life threatening (benign) and usually goes away in time.  °  WHEN SHOULD I PERFORM THE EPLEY MANEUVER? °You can do this maneuver at home whenever you have symptoms of vertigo. You may do the Epley maneuver up to 3 times a day until your symptoms of vertigo go away. °HOW SHOULD I DO THE EPLEY MANEUVER? °7. Sit on the edge of a  bed or table with your back straight. Your legs should be extended or hanging over the edge of the bed or table.   °8. Turn your head halfway toward the affected ear.   °9. Lie backward quickly with your head turned until you are lying flat on your back. You may want to position a pillow under your shoulders.   °10. Hold this position for 30 seconds. You may experience an attack of vertigo. This is normal. Hold this position until the vertigo stops. °11. Then turn your head to the opposite direction until your unaffected ear is facing the floor.   °12. Hold this position for 30 seconds. You may experience an attack of vertigo. This is normal. Hold this position until the vertigo stops. °13. Now turn your whole body to the same side as your head. Hold for another 30 seconds.   °14. You can then sit back up. °ARE THERE RISKS TO THIS MANEUVER? °In some cases, you may have other symptoms (such as changes in your vision, weakness, or numbness). If you have these symptoms, stop doing the maneuver and call your health care provider. Even if doing these maneuvers relieves your vertigo, you may still have dizziness. Dizziness is the sensation of light-headedness but without the sensation of movement. Even though the Epley maneuver may relieve your vertigo, it is possible that your symptoms will return within 5 years. °WHAT SHOULD I DO AFTER THIS MANEUVER? °After doing the Epley maneuver, you can return to your normal activities. Ask your doctor if there is anything you should do at home to prevent vertigo. This may include: °· Sleeping with two or more pillows to keep your head elevated. °· Not sleeping on the side of your affected ear. °· Getting up slowly from bed. °· Avoiding sudden movements during the day. °· Avoiding extreme head movement, like looking up or bending over. °· Wearing a cervical collar to prevent sudden head movements. °WHAT SHOULD I DO IF MY SYMPTOMS GET WORSE? °Call your health care provider if your  vertigo gets worse. Call your provider right way if you have other symptoms, including:  °· Nausea. °· Vomiting. °· Headache. °· Weakness. °· Numbness. °· Vision changes. °Document Released: 01/08/2013 Document Reviewed: 01/08/2013 °ExitCare® Patient Information ©2015 [employer]. This information is not intended to replace advice given to you by your health care provider. Make sure you discuss any questions you have with your health care provider. ° °

## 2014-08-07 NOTE — ED Notes (Addendum)
Patient states she has a three week history of productive cough with white colored sputum.  States for the last two weeks, she has had constant dizziness, and this morning she was driving and had to pull over due to the dizziness.  Today, the dizziness was associated with generalized shaking. History of same approximately one year ago, which was associated with fainting.

## 2014-08-07 NOTE — ED Notes (Signed)
Patient preparing for discharge. 

## 2014-08-07 NOTE — ED Notes (Signed)
MD at bedside. 

## 2014-09-23 ENCOUNTER — Other Ambulatory Visit (HOSPITAL_COMMUNITY): Payer: Self-pay | Admitting: Otolaryngology

## 2014-09-23 DIAGNOSIS — R1314 Dysphagia, pharyngoesophageal phase: Secondary | ICD-10-CM

## 2014-09-23 DIAGNOSIS — R1310 Dysphagia, unspecified: Secondary | ICD-10-CM

## 2014-09-25 ENCOUNTER — Ambulatory Visit (HOSPITAL_COMMUNITY)
Admission: RE | Admit: 2014-09-25 | Discharge: 2014-09-25 | Disposition: A | Discharge status: Home / Self Care | Payer: BC Managed Care – PPO | Source: Ambulatory Visit | Attending: Otolaryngology | Admitting: Otolaryngology

## 2014-09-25 DIAGNOSIS — R1310 Dysphagia, unspecified: Secondary | ICD-10-CM

## 2014-09-25 DIAGNOSIS — R1314 Dysphagia, pharyngoesophageal phase: Secondary | ICD-10-CM | POA: Diagnosis not present

## 2014-09-26 ENCOUNTER — Telehealth: Payer: Self-pay | Admitting: Internal Medicine

## 2014-09-26 NOTE — Telephone Encounter (Signed)
Patient called in regarding current issue of esophagitis. She had a swallowing test yesterday (listed in the imaging tab), and has an appt with GI on 10/14/2014. She explained that she would like something for the pain in the meantime. I advised her that i would getthe information to you to determine if we would even manage this issue.   At this point, the swallowing test has not been resulted. I advised her that we would contact her back to see if she needs to make an appt with [place] regarding this issue so that we could manage her pain or if she would needto return to her ENT, who referred her to the swallowing test to begin with. Please review and let the patient know how she should proceed

## 2014-09-29 MED ORDER — LIDOCAINE VISCOUS 2 % MT SOLN: 5.0000 mL | OROMUCOSAL | Status: DC | PRN

## 2014-09-29 MED ORDER — OMEPRAZOLE 20 MG PO CPDR: 20.0000 mg | DELAYED_RELEASE_CAPSULE | Freq: Every day | ORAL | Status: DC

## 2014-09-29 NOTE — Telephone Encounter (Signed)
Pt informed

## 2014-09-29 NOTE — Telephone Encounter (Signed)
Patient should begin omeprazole 20 mg once daily in addition to lidocaine gel as needed for swallowing pain. Both sent to [facility]. Follow-up with GI as planned

## 2014-09-29 NOTE — Telephone Encounter (Signed)
Please review the request below.  Pain med for dysphagia.

## 2014-10-08 ENCOUNTER — Encounter: Payer: Self-pay | Admitting: Gastroenterology

## 2014-10-14 ENCOUNTER — Ambulatory Visit (INDEPENDENT_AMBULATORY_CARE_PROVIDER_SITE_OTHER): Payer: BC Managed Care – PPO | Admitting: Gastroenterology

## 2014-10-14 ENCOUNTER — Encounter: Payer: Self-pay | Admitting: Gastroenterology

## 2014-10-14 VITALS — BP 122/92 | HR 96 | Ht 64.25 in | Wt 210.2 lb

## 2014-10-14 DIAGNOSIS — R933 Abnormal findings on diagnostic imaging of other parts of digestive tract: Secondary | ICD-10-CM | POA: Diagnosis not present

## 2014-10-14 DIAGNOSIS — R1310 Dysphagia, unspecified: Secondary | ICD-10-CM | POA: Diagnosis not present

## 2014-10-14 DIAGNOSIS — B370 Candidal stomatitis: Secondary | ICD-10-CM | POA: Insufficient documentation

## 2014-10-14 MED ORDER — FLUCONAZOLE 200 MG PO TABS: 200.0000 mg | ORAL_TABLET | Freq: Every day | ORAL | Status: DC

## 2014-10-14 MED ORDER — PANTOPRAZOLE SODIUM 40 MG PO TBEC: 40.0000 mg | DELAYED_RELEASE_TABLET | Freq: Two times a day (BID) | ORAL | Status: DC

## 2014-10-14 NOTE — Progress Notes (Signed)
i agree with the above note, plan 

## 2014-10-14 NOTE — Patient Instructions (Signed)
We have sent the following medications to your pharmacy for you to pick up at your convenience:fluconazole and pantoprazole.   You have been scheduled for an endoscopy. Please follow written instructions given to you at your visit today. If you use inhalers (even only as needed), please bring them with you on the day of your procedure.

## 2014-10-14 NOTE — Progress Notes (Signed)
     10/14/2014 Nancy Acevedo [id number] June 26, 1960   History of Present Illness:  This is a pleasant 54 year old female who is known to Dr. Ardis Hughs for a screening colonoscopy in May of this year. She presents to our office today at the request of her PCP, Dr. Dutch Quint, and ENT, Dr. Simeon Craft, for evaluation of complaints of difficult and painful swallowing along with an abnormal esophagram. She describes it as being extremely painful to swallow anything including water. When she does try to eat it seems to get hung up and then causes severe pain. She's been living on applesauce. This was pretty sudden in onset about 3 weeks ago. She was placed on omeprazole 20 mg daily and lidocaine liquid. She says that those medications have not really been helping to this point. She saw ENT and they performed a laryngoscopy which was apparently unremarkable. She then underwent an esophagram which showed markedly abnormal thoracic esophageal mucosal pattern compatible with severe non-specific esophagitis.  Denies NSAID use.  No previous similar symptoms.   Current Medications, Allergies, Past Medical History, Past Surgical History, Family History and Social History were reviewed in [employer] record.   Physical Exam: BP 122/92 mmHg  Pulse 96  Ht 5' 4.25" (1.632 m)  Wt 210 lb 4 oz (95.369 kg)  BMI 35.81 kg/m2 General: Well developed black female in no acute distress Head: Normocephalic and atraumatic Eyes:  Sclerae anicteric, conjunctiva pink  Ears: Normal auditory acuity Mouth:  Thrush noted. Lungs: Clear throughout to auscultation Heart: Regular rate and rhythm Abdomen: Soft, non-distended.  Normal bowel sounds.  Tenderness to palpation in epigastrium. Musculoskeletal: Symmetrical with no gross deformities  Extremities: No edema  Neurological: Alert oriented x 4, grossly non-focal Psychological:  Alert and cooperative. Normal mood and affect  Assessment and  Recommendations: -54 year old female with dysphagia/odynophagia:  Fairly sudden onset.  Now esophagram showing severe esophagitis.  ? Reflux related or other such as infectious source, etc.  Will schedule for EGD with Dr. Ardis Hughs.  Will start pantoprazole 40 mg BID.  She appears to have oral thrush on exam; ? If she has candida esophagitis.  Will start fluconazole 200 mg daily for 21 days empirically.  Can use liquid lidocaine prn for now.    *The risks, benefits, and alternatives to EGD were discussed with the patient and she consents to proceed.

## 2014-10-22 ENCOUNTER — Encounter: Payer: Self-pay | Admitting: Gastroenterology

## 2014-10-22 ENCOUNTER — Ambulatory Visit (AMBULATORY_SURGERY_CENTER): Payer: BC Managed Care – PPO | Admitting: Gastroenterology

## 2014-10-22 VITALS — BP 129/76 | HR 64 | Temp 97.9°F | Resp 16 | Ht 64.0 in | Wt 210.0 lb

## 2014-10-22 DIAGNOSIS — R1310 Dysphagia, unspecified: Secondary | ICD-10-CM | POA: Diagnosis present

## 2014-10-22 DIAGNOSIS — K219 Gastro-esophageal reflux disease without esophagitis: Secondary | ICD-10-CM | POA: Diagnosis not present

## 2014-10-22 MED ORDER — SODIUM CHLORIDE 0.9 % IV SOLN: 500.0000 mL | INTRAVENOUS | Status: DC

## 2014-10-22 NOTE — Op Note (Signed)
[place]  [address] [place], [id number]   ENDOSCOPY PROCEDURE REPORT  PATIENT: Nancy Acevedo, Nancy Acevedo  MR#: [id number] BIRTHDATE: [birth year] , 53  yrs. old GENDER: female ENDOSCOPIST: Milus Banister, MD REFERRED BY:  Gwendolyn Grant, M.D. PROCEDURE DATE:  10/22/2014 PROCEDURE:  EGD w/ biopsy ASA CLASS:     Class II INDICATIONS:  dysphagia, odynophia (acute), abnormal barium esophagram; symptoms have improved since starting BID PPI and diflucan 7-8 days ago. MEDICATIONS: Monitored anesthesia care and Propofol 150 mg IV TOPICAL ANESTHETIC: none  DESCRIPTION OF PROCEDURE: After the risks benefits and alternatives of the procedure were thoroughly explained, informed consent was obtained.  The LB [id number] endoscope was introduced through the mouth and advanced to the second portion of the duodenum , Without limitations.  The instrument was slowly withdrawn as the mucosa was fully examined.    The esophageal mucosa was abnormal appearing throughout but mainly in the proximal 1/2 of the esophagus.  There were numerous linear and irregularly patterned furrows throughout.  No obvious candida infection or ulcers.  The esophagus was biopsied (distally and proximally) and the mucosa seemed to be thick, firm.  The examination was otherwise normal.  Retroflexed views revealed no abnormalities.     The scope was then withdrawn from the patient and the procedure completed.  COMPLICATIONS: There were no immediate complications.  ENDOSCOPIC IMPRESSION: The esophageal mucosa was abnormal appearing throughout but mainly in the proximal 1/2 of the esophagus.  There were numerous linear and irregularly patterned furrows throughout.  No obvious candida infection or ulcers.  The esophagus was biopsied (distally and proximally) and the mucosa seemed to be thick, firm.  The examination was otherwise normal  RECOMMENDATIONS: Await pathology results; please continue  twice daily protonix. Continue once daily diflucan until you complete the course.   eSigned:  Milus Banister, MD 10/22/2014 11:36 AM

## 2014-10-22 NOTE — Progress Notes (Signed)
Called to room to assist during endoscopic procedure.  Patient ID and intended procedure confirmed with present staff. Received instructions for my participation in the procedure from the performing physician.  

## 2014-10-22 NOTE — Patient Instructions (Signed)
YOU HAD AN ENDOSCOPIC PROCEDURE TODAY AT THE [facility]:   Refer to the procedure report that was given to you for any specific questions about what was found during the examination.  If the procedure report does not answer your questions, please call your gastroenterologist to clarify.  If you requested that your care partner not be given the details of your procedure findings, then the procedure report has been included in a sealed envelope for you to review at your convenience later.  YOU SHOULD EXPECT: Some feelings of bloating in the abdomen. Passage of more gas than usual.  Walking can help get rid of the air that was put into your GI tract during the procedure and reduce the bloating. If you had a lower endoscopy (such as a colonoscopy or flexible sigmoidoscopy) you may notice spotting of blood in your stool or on the toilet paper. If you underwent a bowel prep for your procedure, you may not have a normal bowel movement for a few days.  Please Note:  You might notice some irritation and congestion in your nose or some drainage.  This is from the oxygen used during your procedure.  There is no need for concern and it should clear up in a day or so.  SYMPTOMS TO REPORT IMMEDIATELY:   Following upper endoscopy (EGD)  Vomiting of blood or coffee ground material  New chest pain or pain under the shoulder blades  Painful or persistently difficult swallowing  New shortness of breath  Fever of 100F or higher  Black, tarry-looking stools  For urgent or emergent issues, a gastroenterologist can be reached at any hour by calling [phone].   DIET: Your first meal following the procedure should be a small meal and then it is ok to progress to your normal diet. Heavy or fried foods are harder to digest and may make you feel nauseous or bloated.  Likewise, meals heavy in dairy and vegetables can increase bloating.  Drink plenty of fluids but you should avoid alcoholic beverages for  24 hours.  ACTIVITY:  You should plan to take it easy for the rest of today and you should NOT DRIVE or use heavy machinery until tomorrow (because of the sedation medicines used during the test).    FOLLOW UP: Our staff will call the number listed on your records the next business day following your procedure to check on you and address any questions or concerns that you may have regarding the information given to you following your procedure. If we do not reach you, we will leave a message.  However, if you are feeling well and you are not experiencing any problems, there is no need to return our call.  We will assume that you have returned to your regular daily activities without incident.  If any biopsies were taken you will be contacted by phone or by letter within the next 1-3 weeks.  Please call us at [phone] if you have not heard about the biopsies in 3 weeks.    SIGNATURES/CONFIDENTIALITY: You and/or your care partner have signed paperwork which will be entered into your electronic medical record.  These signatures attest to the fact that that the information above on your After Visit Summary has been reviewed and is understood.  Full responsibility of the confidentiality of this discharge information lies with you and/or your care-partner.  Await biopsy results. 

## 2014-10-22 NOTE — Progress Notes (Signed)
Patient stated went to the ER in [facility] on Friday on 10-18-2014. Chest Pain.

## 2014-10-22 NOTE — Progress Notes (Signed)
A/ox3 pleased with MAC, report to Penny RN 

## 2014-10-23 ENCOUNTER — Telehealth: Payer: Self-pay | Admitting: *Deleted

## 2014-10-23 NOTE — Telephone Encounter (Signed)
  Follow up Call-  Call back number 10/22/2014 06/09/2014  Post procedure Call Back phone  # [phone]  Permission to leave phone message Yes Yes     Patient questions:  Do you have a fever, pain , or abdominal swelling? No. Pain Score  0 *  Have you tolerated food without any problems? Yes.    Have you been able to return to your normal activities? Yes.    Do you have any questions about your discharge instructions: Diet   No. Medications  No. Follow up visit  No.  Do you have questions or concerns about your Care? No.  Actions: * If pain score is 4 or above: No action needed, pain <4.

## 2014-12-01 ENCOUNTER — Ambulatory Visit (INDEPENDENT_AMBULATORY_CARE_PROVIDER_SITE_OTHER): Payer: BC Managed Care – PPO | Admitting: Internal Medicine

## 2014-12-01 ENCOUNTER — Encounter: Payer: Self-pay | Admitting: Internal Medicine

## 2014-12-01 ENCOUNTER — Ambulatory Visit (INDEPENDENT_AMBULATORY_CARE_PROVIDER_SITE_OTHER)
Admission: RE | Admit: 2014-12-01 | Discharge: 2014-12-01 | Disposition: A | Discharge status: Home / Self Care | Payer: BC Managed Care – PPO | Source: Ambulatory Visit | Attending: Internal Medicine | Admitting: Internal Medicine

## 2014-12-01 ENCOUNTER — Other Ambulatory Visit (INDEPENDENT_AMBULATORY_CARE_PROVIDER_SITE_OTHER): Payer: BC Managed Care – PPO

## 2014-12-01 VITALS — BP 164/104 | HR 88 | Temp 98.4°F | Resp 16 | Wt 214.0 lb

## 2014-12-01 DIAGNOSIS — E119 Type 2 diabetes mellitus without complications: Secondary | ICD-10-CM | POA: Diagnosis not present

## 2014-12-01 DIAGNOSIS — R220 Localized swelling, mass and lump, head: Secondary | ICD-10-CM | POA: Diagnosis not present

## 2014-12-01 DIAGNOSIS — I10 Essential (primary) hypertension: Secondary | ICD-10-CM

## 2014-12-01 DIAGNOSIS — R221 Localized swelling, mass and lump, neck: Secondary | ICD-10-CM

## 2014-12-01 DIAGNOSIS — M25552 Pain in left hip: Secondary | ICD-10-CM | POA: Diagnosis not present

## 2014-12-01 LAB — CBC WITH DIFFERENTIAL/PLATELET
BASOS ABS: 0 10*3/uL (ref 0.0–0.1)
Basophils Relative: 0 % (ref 0.0–3.0)
Eosinophils Absolute: 0 10*3/uL (ref 0.0–0.7)
Eosinophils Relative: 0.1 % (ref 0.0–5.0)
HCT: 32.4 % — ABNORMAL LOW (ref 36.0–46.0)
Hemoglobin: 10.6 g/dL — ABNORMAL LOW (ref 12.0–15.0)
LYMPHS ABS: 1.9 10*3/uL (ref 0.7–4.0)
Lymphocytes Relative: 42.8 % (ref 12.0–46.0)
MCHC: 32.6 g/dL (ref 30.0–36.0)
MCV: 86.8 fl (ref 78.0–100.0)
MONOS PCT: 10.3 % (ref 3.0–12.0)
Monocytes Absolute: 0.5 10*3/uL (ref 0.1–1.0)
NEUTROS PCT: 46.8 % (ref 43.0–77.0)
Neutro Abs: 2.1 10*3/uL (ref 1.4–7.7)
Platelets: 327 10*3/uL (ref 150.0–400.0)
RBC: 3.73 Mil/uL — AB (ref 3.87–5.11)
RDW: 14.9 % (ref 11.5–15.5)
WBC: 4.5 10*3/uL (ref 4.0–10.5)

## 2014-12-01 LAB — TSH: TSH: 1.33 u[IU]/mL (ref 0.35–4.50)

## 2014-12-01 LAB — MICROALBUMIN / CREATININE URINE RATIO
Creatinine,U: 97.3 mg/dL
Microalb Creat Ratio: 11.1 mg/g (ref 0.0–30.0)
Microalb, Ur: 10.8 mg/dL — ABNORMAL HIGH (ref 0.0–1.9)

## 2014-12-01 LAB — COMPREHENSIVE METABOLIC PANEL
ALBUMIN: 4.3 g/dL (ref 3.5–5.2)
ALT: 26 U/L (ref 0–35)
AST: 36 U/L (ref 0–37)
Alkaline Phosphatase: 54 U/L (ref 39–117)
BUN: 12 mg/dL (ref 6–23)
CHLORIDE: 105 meq/L (ref 96–112)
CO2: 28 meq/L (ref 19–32)
CREATININE: 0.79 mg/dL (ref 0.40–1.20)
Calcium: 9.3 mg/dL (ref 8.4–10.5)
GFR: 97.89 mL/min (ref >60.00)
GLUCOSE: 84 mg/dL (ref 70–99)
Potassium: 3.8 mEq/L (ref 3.5–5.1)
SODIUM: 141 meq/L (ref 135–145)
Total Bilirubin: 0.4 mg/dL (ref 0.2–1.2)
Total Protein: 8 g/dL (ref 6.0–8.3)

## 2014-12-01 LAB — LIPID PANEL
CHOL/HDL RATIO: 4
Cholesterol: 175 mg/dL (ref 0–200)
HDL: 43 mg/dL (ref >39.00)
LDL CALC: 105 mg/dL — AB (ref 0–99)
NONHDL: 131.74
Triglycerides: 135 mg/dL (ref 0.0–149.0)
VLDL: 27 mg/dL (ref 0.0–40.0)

## 2014-12-01 LAB — HEMOGLOBIN A1C: Hgb A1c MFr Bld: 5.6 % (ref 4.6–6.5)

## 2014-12-01 MED ORDER — CLONIDINE HCL 0.1 MG PO TABS: 0.1000 mg | ORAL_TABLET | Freq: Three times a day (TID) | ORAL | Status: DC

## 2014-12-01 MED ORDER — METOPROLOL SUCCINATE ER 100 MG PO TB24: 100.0000 mg | ORAL_TABLET | Freq: Every day | ORAL | Status: AC

## 2014-12-01 NOTE — Progress Notes (Signed)
Subjective:    Patient ID: Nancy Acevedo, female    DOB: 08-13-1960, 54 y.o.   MRN: [id number]  HPI She is here to establish with a new pcp.   Lump on neck: She has a lump on the left side of her neck.  She noticed it about one month ago.  It has gotten bigger.  She denies pain.    Hypertension: She is taking her medication daily. She is compliant with a low sodium diet.  She denies chest pain, edema, shortness of breath and regular headaches. She is very active at work, but not exercising regularly. She has lost some weight. She does monitor her blood pressure at home - 174/> 100.    Left hip pain:  She is having left hip pain and it radiates down to the upper lateral thigh.  Walking makes it worse and she walks all day at work.  She does get woken up at night with the pain.  She has taken advil, but it is not been helping.  It has been there for a few months and is getting worse.  She denies injury. No prior hip pain.    Diabetes:  She is not taking any medication. Her last A1c was well controlled. She is very active at work, but not exercising regularly.  Medications and allergies reviewed with patient and updated if appropriate.  Patient Active Problem List   Diagnosis Date Noted  . Dysphagia 10/14/2014  . Odynophagia 10/14/2014  . Abnormal esophagram 10/14/2014  . Oral candidiasis 10/14/2014  . Pain in joint, ankle and foot 10/21/2013  . Muscle spasm of back 02/06/2013  . Nonallopathic lesion of thoracic region 02/06/2013  . Rash, drug 11/20/2012  . OSA (obstructive sleep apnea)   . Depression   . Obese   . Abnormal LFTs (liver function tests) 02/17/2011  . Diabetes mellitus, type 2 ([place]) 12/08/2010  . Hypertension   . Headaches, cluster     Current Outpatient Prescriptions on File Prior to Visit  Medication Sig Dispense Refill  . cloNIDine (CATAPRES) 0.1 MG tablet Take 1 tablet (0.1 mg total) by mouth 2 (two) times daily. 180 tablet 3  . metoprolol succinate  (TOPROL-XL) 50 MG 24 hr tablet Take 1 tablet (50 mg total) by mouth daily. Take with or immediately following a meal. 90 tablet 3  . Olmesartan-Amlodipine-HCTZ (TRIBENZOR) 40-10-25 MG TABS Take 1 tablet by mouth daily. 90 tablet 3  . pantoprazole (PROTONIX) 40 MG tablet Take 1 tablet (40 mg total) by mouth 2 (two) times daily. (Patient taking differently: Take 40 mg by mouth as needed. ) 60 tablet 2   No current facility-administered medications on file prior to visit.    Past Medical History  Diagnosis Date  . Hypertension   . Headaches, cluster   . History of hyperglycemia 1993  . Diabetes mellitus, type 2 ([place]) dx 01/2011  . OSA (obstructive sleep apnea)   . EP (ectopic pregnancy)   . Candidiasis of mouth   . Sleep apnea     cpap use     Past Surgical History  Procedure Laterality Date  . Tonsillectomy and adenoidectomy  2012  . Ectopic pregnancy surgery      left fallopian tube  . Myringotomy with tube placement    . Tubal ligation      Social History   Social History  . Marital Status: Single    Spouse Name: N/A  . Number of Children: 2  . Years of Education:  N/A   Occupational History  . tech    Social History Main Topics  . Smoking status: Never Smoker   . Smokeless tobacco: Never Used  . Alcohol Use: No  . Drug Use: No  . Sexual Activity: Not Asked   Other Topics Concern  . None   Social History Narrative   Administration at [employer]    Review of Systems  Constitutional: Negative for fever and chills.  Eyes: Positive for visual disturbance (blurry vision).  Respiratory: Negative for cough, shortness of breath and wheezing.   Cardiovascular: Positive for palpitations (fluttering, 2-3 times a month, transient). Negative for chest pain and leg swelling.  Endocrine: Positive for polydipsia and polyuria.  Musculoskeletal: Negative for back pain.  Neurological: Positive for headaches. Negative for dizziness, weakness, light-headedness and numbness.        Objective:   Filed Vitals:   12/01/14 1307  BP: 164/104  Pulse: 88  Temp: 98.4 F (36.9 C)  Resp: 16   Filed Weights   12/01/14 1307  Weight: 214 lb (97.07 kg)   Body mass index is 36.72 kg/(m^2).   Physical Exam  Constitutional: She appears well-developed and well-nourished. No distress.  HENT:  Head: Normocephalic and atraumatic.  Neck: No tracheal deviation present.  Swelling at base of neck near left clavicle - ? Thyroid. No carotid bruit  Cardiovascular: Normal rate, regular rhythm and normal heart sounds.   Pulmonary/Chest: Effort normal and breath sounds normal. No respiratory distress. She has no wheezes. She has no rales.  Musculoskeletal: She exhibits no edema.  Left hip pain with palpation on lateral aspect of hip  Lymphadenopathy:    She has no cervical adenopathy.  Skin: She is not diaphoretic.  Psychiatric: She has a normal mood and affect. Her behavior is normal.        Assessment & Plan:   See Problem List.  Follow up in 4 weeks

## 2014-12-01 NOTE — Assessment & Plan Note (Signed)
Currently medications Poorly controlled Ultrasound renal artery disease in the past, but has not been done and we will reorder today Increase metoprolol to 100 mg daily Continue other medications at current doses Follow-up in 4 weeks

## 2014-12-01 NOTE — Assessment & Plan Note (Signed)
Diet-controlled Last A1c well-controlled Recheck A1c, urine microalbumin

## 2014-12-01 NOTE — Assessment & Plan Note (Signed)
Will check ultrasound

## 2014-12-01 NOTE — Assessment & Plan Note (Signed)
Arthritis versus possible bursitis Will check x-ray Will set up an appointment with Dr. Tamala Julian

## 2014-12-01 NOTE — Patient Instructions (Signed)
  We have reviewed your prior records including labs and tests today.  Test(s) ordered today. Your results will be released to [place] (or called to you) after review, usually within 72hours after test completion. If any changes need to be made, you will be notified at that same time.  All other Health Maintenance issues reviewed.   All recommended immunizations and age-appropriate screenings are up-to-date.  No immunizations administered today.    Medications reviewed and updated.  Changes include increasing your metoprolol to 100 mg daily.    Your prescription(s) have been submitted to your pharmacy. Please take as directed and contact our office if you believe you are having problem(s) with the medication(s).  An xray of your hip was ordered - you can have that done today.  Schedule an appointment with Dr. Tamala Julian for further evaluation of the hip.    An ultrasound of your neck was ordered.  An ultrasound of your kidneys was ordered.  Please schedule followup in 4 weeks so we can recheck your BP.

## 2014-12-01 NOTE — Progress Notes (Signed)
Pre visit review using our clinic review tool, if applicable. No additional management support is needed unless otherwise documented below in the visit note. 

## 2014-12-04 ENCOUNTER — Telehealth: Payer: Self-pay | Admitting: Emergency Medicine

## 2014-12-04 MED ORDER — MELOXICAM 15 MG PO TABS: 15.0000 mg | ORAL_TABLET | Freq: Every day | ORAL | Status: DC

## 2014-12-04 MED ORDER — CLONIDINE HCL 0.1 MG PO TABS: 0.1000 mg | ORAL_TABLET | Freq: Three times a day (TID) | ORAL | Status: DC

## 2014-12-04 NOTE — Telephone Encounter (Signed)
Spoke with pt to inform.  

## 2014-12-04 NOTE — Telephone Encounter (Signed)
Spoke with pt to inform of Hip Xray. Pt asked if there was anything that we could send in for the pain. Please advise.

## 2014-12-04 NOTE — Telephone Encounter (Signed)
Trial of meloxicam  - sent to pharmacy.  She should not take any aspirin, aleve or ibuprofen while taking this medication.  If should be taken with food once a day

## 2014-12-08 ENCOUNTER — Other Ambulatory Visit: Payer: BC Managed Care – PPO

## 2014-12-15 ENCOUNTER — Other Ambulatory Visit: Payer: BC Managed Care – PPO

## 2014-12-24 ENCOUNTER — Telehealth: Payer: Self-pay | Admitting: Internal Medicine

## 2014-12-24 NOTE — Telephone Encounter (Signed)
Pt called state that meloxicam is not working for her hip. Please advise

## 2014-12-24 NOTE — Telephone Encounter (Signed)
I asked her to set up an appt with Dr. Tamala Julian to evaluate her pain  ---has she scheduled this appt?

## 2014-12-25 NOTE — Telephone Encounter (Signed)
LVM for pt to call back.

## 2014-12-26 NOTE — Telephone Encounter (Signed)
Pt has an appt with Dr.Smith 01/06/15

## 2015-01-06 ENCOUNTER — Ambulatory Visit: Payer: BC Managed Care – PPO | Admitting: Family Medicine

## 2015-01-15 ENCOUNTER — Other Ambulatory Visit (INDEPENDENT_AMBULATORY_CARE_PROVIDER_SITE_OTHER): Payer: BC Managed Care – PPO

## 2015-01-15 ENCOUNTER — Ambulatory Visit (INDEPENDENT_AMBULATORY_CARE_PROVIDER_SITE_OTHER): Payer: BC Managed Care – PPO | Admitting: Family Medicine

## 2015-01-15 ENCOUNTER — Encounter: Payer: Self-pay | Admitting: Family Medicine

## 2015-01-15 VITALS — BP 138/82 | HR 67 | Ht 64.0 in | Wt 209.0 lb

## 2015-01-15 DIAGNOSIS — M7062 Trochanteric bursitis, left hip: Secondary | ICD-10-CM | POA: Insufficient documentation

## 2015-01-15 DIAGNOSIS — M25552 Pain in left hip: Secondary | ICD-10-CM

## 2015-01-15 MED ORDER — DICLOFENAC SODIUM 2 % TD SOLN: TRANSDERMAL | Status: DC

## 2015-01-15 NOTE — Assessment & Plan Note (Signed)
Given an injection today and tolerated the procedure well. We discussed icing regimen and home exercises. We discussed proper shoewear. Encourage weight loss. Patient is going to continue to try to increase her activity. Work with Product/process development scientist to learn home exercises in greater detail. Patient will come back in 4 weeks for further evaluation and treatment. Prescription of and topical anti-inflammatory given.

## 2015-01-15 NOTE — Patient Instructions (Addendum)
Good to see you.  Ice 20 minutes 2 times daily. Usually after activity and before bed. Exercises 3 times a week.  pennsaid pinkie amount topically 2 times daily as needed.  Vitsamin D 2000- IU daily Turmeric 500mg  twice daily Good shoes always will be helpful See me again in 4 weeks to make sure you are good Happy New Year!

## 2015-01-15 NOTE — Progress Notes (Signed)
Pre visit review using our clinic review tool, if applicable. No additional management support is needed unless otherwise documented below in the visit note. 

## 2015-01-15 NOTE — Progress Notes (Signed)
[facility] [place], [place] [address] Phone: [phone] Subjective:    I'm seeing this patient by the request  of:  Binnie Rail, MD   CC: left hip pain  [id number] Nancy Acevedo is a 54 y.o. female coming in with complaint of left hip pain. Seems to be more of an insidious onset. Worsening over the course of time. Was intermittent and now seems to be chronic. Lateral aspect of the hip that seems to radiate down the leg somewhat. Denies any back pain that is associated with it. It is waking her up if she rolls on that side. Rates the severity of pain is 8 out of 10. Worse after sitting a long amount of time and getting up. Responds minorly to over-the-counter anti-inflammatories. No injury.  Past Medical History  Diagnosis Date  . Hypertension   . Headaches, cluster   . History of hyperglycemia 1993  . Diabetes mellitus, type 2 ([place]) dx 01/2011  . OSA (obstructive sleep apnea)   . EP (ectopic pregnancy)   . Candidiasis of mouth   . Sleep apnea     cpap use    Past Surgical History  Procedure Laterality Date  . Tonsillectomy and adenoidectomy  2012  . Ectopic pregnancy surgery      left fallopian tube  . Myringotomy with tube placement    . Tubal ligation     Social History  Substance Use Topics  . Smoking status: Never Smoker   . Smokeless tobacco: Never Used  . Alcohol Use: No   Allergies  Allergen Reactions  . Lexapro [Escitalopram] Itching  . Spironolactone     rash   Family History  Problem Relation Age of Onset  . Arthritis Sister   . Diabetes Mother   . Hypertension Mother   . Colon cancer Neg Hx   . Rectal cancer Neg Hx   . Stomach cancer Neg Hx         Past medical history, social, surgical and family history all reviewed in electronic medical record.   Review of Systems: No headache, visual changes, nausea, vomiting, diarrhea, constipation, dizziness, abdominal pain, skin rash, fevers, chills,  night sweats, weight loss, swollen lymph nodes, body aches, joint swelling, muscle aches, chest pain, shortness of breath, mood changes.   Objective Blood pressure 138/82, pulse 67, height 5\' 4"  (1.626 m), weight 209 lb (94.802 kg), SpO2 99 %.  General: No apparent distress alert and oriented x3 mood and affect normal, dressed appropriately.  HEENT: Pupils equal, extraocular movements intact  Respiratory: Patient's speak in full sentences and does not appear short of breath  Cardiovascular: No lower extremity edema, non tender, no erythema  Skin: Warm dry intact with no signs of infection or rash on extremities or on axial skeleton.  Abdomen: Soft nontender  Neuro: Cranial nerves II through XII are intact, neurovascularly intact in all extremities with 2+ DTRs and 2+ pulses.  Lymph: No lymphadenopathy of posterior or anterior cervical chain or axillae bilaterally.  Gait normal with good balance and coordination.  MSK:  Non tender with full range of motion and good stability and symmetric strength and tone of shoulders, elbows, wrist,  knee and ankles bilaterally.  [id number] ROM IR: 15 Deg, ER: 35 Deg, Flexion: 120 Deg, Extension: 100 Deg, Abduction: 45 Deg, Adduction: 45 Deg Strength IR: 5/5, ER: 5/5, Flexion: 5/5, Extension: 5/5, Abduction: 4/5, Adduction: 5/5 Pelvic alignment unremarkable to inspection and palpation. Standing hip rotation and gait  without trendelenburg sign / unsteadiness. Greater trochanter severe tenderness to palpation No tenderness over piriformis  Positive Faber No SI joint tenderness and normal minimal SI movement. Negative straight leg test Contralateral side unremarkable   MSK US performed of: left This study was ordered, performed, and interpreted by Charlann Boxer D.O.  Hip: Trochanteric bursa with significant hypoechoic changes and swelling Acetabular labrum visualized and without tears, displacement, or effusion in joint. Femoral neck appears unremarkable  without increased power doppler signal along Cortex.  IMPRESSION:  Greater trochanter bursitis   Procedure: Real-time Ultrasound Guided Injection of left greater trochanteric bursitis secondary to patient's body habitus Device: GE Logiq E  Ultrasound guided injection is preferred based studies that show increased duration, increased effect, greater accuracy, decreased procedural pain, increased response rate, and decreased cost with ultrasound guided versus blind injection.  Verbal informed consent obtained.  Time-out conducted.  Noted no overlying erythema, induration, or other signs of local infection.  Skin prepped in a sterile fashion.  Local anesthesia: Topical Ethyl chloride.  With sterile technique and under real time ultrasound guidance:  Greater trochanteric area was visualized and patient's bursa was noted. A 22-gauge 3 inch needle was inserted and 4 cc of 0.5% Marcaine and 1 cc of Kenalog 40 mg/dL was injected. Pictures taken Completed without difficulty  Pain immediately resolved suggesting accurate placement of the medication.  Advised to call if fevers/chills, erythema, induration, drainage, or persistent bleeding.  Images permanently stored and available for review in the ultrasound unit.  Impression: Technically successful ultrasound guided injection.   Procedure note [id number]; 15 minutes spent for Therapeutic exercises as stated in above notes.  This included exercises focusing on stretching, strengthening, with significant focus on eccentric aspects.   Pelvic tilt/bracing to help with proper recruitment of the lower abs and pelvic floor muscles  Glute strengthening to properly contract glutes without over-engaging low back and hamstrings - prone hip extension and glute bridge exercises Proper stretching techniques to increase effectiveness for the hip flexors, groin, quads, piriformis and low back when appropriate  Proper technique shown and discussed handout in great detail  with ATC.  All questions were discussed and answered.      Impression and Recommendations:     This case required medical decision making of moderate complexity.

## 2015-01-27 ENCOUNTER — Emergency Department (HOSPITAL_BASED_OUTPATIENT_CLINIC_OR_DEPARTMENT_OTHER)
Admission: EM | Admit: 2015-01-27 | Discharge: 2015-01-27 | Disposition: A | Discharge status: Home / Self Care | Payer: BC Managed Care – PPO | Attending: Emergency Medicine | Admitting: Emergency Medicine

## 2015-01-27 DIAGNOSIS — E119 Type 2 diabetes mellitus without complications: Secondary | ICD-10-CM | POA: Insufficient documentation

## 2015-01-27 DIAGNOSIS — M10071 Idiopathic gout, right ankle and foot: Secondary | ICD-10-CM | POA: Diagnosis not present

## 2015-01-27 DIAGNOSIS — M79674 Pain in right toe(s): Secondary | ICD-10-CM | POA: Diagnosis present

## 2015-01-27 DIAGNOSIS — Z8619 Personal history of other infectious and parasitic diseases: Secondary | ICD-10-CM | POA: Diagnosis not present

## 2015-01-27 DIAGNOSIS — Z79899 Other long term (current) drug therapy: Secondary | ICD-10-CM | POA: Insufficient documentation

## 2015-01-27 DIAGNOSIS — G4730 Sleep apnea, unspecified: Secondary | ICD-10-CM | POA: Diagnosis not present

## 2015-01-27 DIAGNOSIS — Z791 Long term (current) use of non-steroidal anti-inflammatories (NSAID): Secondary | ICD-10-CM | POA: Insufficient documentation

## 2015-01-27 DIAGNOSIS — I10 Essential (primary) hypertension: Secondary | ICD-10-CM | POA: Diagnosis not present

## 2015-01-27 DIAGNOSIS — M109 Gout, unspecified: Secondary | ICD-10-CM

## 2015-01-27 MED ORDER — PREDNISONE 10 MG PO TABS
30.0000 mg | ORAL_TABLET | Freq: Once | ORAL | Status: AC
  Administered 2015-01-27: 30 mg via ORAL
  Filled 2015-01-27: qty 1

## 2015-01-27 MED ORDER — NAPROXEN 250 MG PO TABS: 500.0000 mg | ORAL_TABLET | Freq: Once | ORAL | Status: DC

## 2015-01-27 MED ORDER — PREDNISONE 20 MG PO TABS: ORAL_TABLET | ORAL | Status: DC

## 2015-01-27 MED ORDER — OXYCODONE-ACETAMINOPHEN 5-325 MG PO TABS: 1.0000 | ORAL_TABLET | ORAL | Status: DC | PRN

## 2015-01-27 NOTE — ED Notes (Addendum)
Patient here with right great toe pain since Sunday. Reports that she awoke with redness and swelling to toe, no trauma. Painful to touch or any ambulation, no hx of gout

## 2015-01-27 NOTE — ED Provider Notes (Signed)
CSN: [id number]     Arrival date & time 01/27/15  1007 History   First MD Initiated Contact with Patient 01/27/15 1024     Chief Complaint  Patient presents with  . Toe Pain     (Consider location/radiation/quality/duration/timing/severity/associated sxs/prior Treatment) HPI Patient began to notice pain in her right great toe 2 days ago. She reports this morning she awoke and it was red and swollen. She reports is very painful even to light touch. Any movement is also exquisitely painful. No known injury. Patient poor she's otherwise felt well. She has no associated fever, chills. There is no radiation of pain into the forefoot or heel. Her other joints are without inflammation or pain. Patient does not have a personal history of gout. She does however report her sister and her father both have gout. Patient's only medical problem is hypertension. She does report her hypertension is fairly difficult to control. She states she is on 3 medications and yet she still experiences blood pressure increases. She has no symptoms at this time. Past Medical History  Diagnosis Date  . Hypertension   . Headaches, cluster   . History of hyperglycemia 1993  . Diabetes mellitus, type 2 ([place]) dx 01/2011  . OSA (obstructive sleep apnea)   . EP (ectopic pregnancy)   . Candidiasis of mouth   . Sleep apnea     cpap use    Past Surgical History  Procedure Laterality Date  . Tonsillectomy and adenoidectomy  2012  . Ectopic pregnancy surgery      left fallopian tube  . Myringotomy with tube placement    . Tubal ligation     Family History  Problem Relation Age of Onset  . Arthritis Sister   . Diabetes Mother   . Hypertension Mother   . Colon cancer Neg Hx   . Rectal cancer Neg Hx   . Stomach cancer Neg Hx    Social History  Substance Use Topics  . Smoking status: Never Smoker   . Smokeless tobacco: Never Used  . Alcohol Use: No   OB History    No data available     Review of Systems 10  Systems reviewed and are negative for acute change except as noted in the HPI.   Allergies  Lexapro and Spironolactone  Home Medications   Prior to Admission medications   Medication Sig Start Date End Date Taking? Authorizing Provider  cloNIDine (CATAPRES) 0.1 MG tablet Take 1 tablet (0.1 mg total) by mouth 3 (three) times daily. 12/04/14   Binnie Rail, MD  Diclofenac Sodium 2 % SOLN Apply 1 pump twice daily. 01/15/15   Lyndal Pulley, DO  metoprolol succinate (TOPROL-XL) 100 MG 24 hr tablet Take 1 tablet (100 mg total) by mouth daily. Take with or immediately following a meal. 12/01/14   Binnie Rail, MD  Olmesartan-Amlodipine-HCTZ [facility]) 40-10-25 MG TABS Take 1 tablet by mouth daily. 07/08/14   Rowe Clack, MD  oxyCODONE-acetaminophen (PERCOCET) 5-325 MG tablet Take 1-2 tablets by mouth every 4 (four) hours as needed. 01/27/15   Charlesetta Shanks, MD  pantoprazole (PROTONIX) 40 MG tablet Take 1 tablet (40 mg total) by mouth 2 (two) times daily. Patient taking differently: Take 40 mg by mouth as needed.  10/14/14   Jessica D Zehr, PA-C  predniSONE (DELTASONE) 20 MG tablet 3 tabs po daily x 3 days, then 2 tabs x 3 days, then 1.5 tabs x 3 days, then 1 tab x 3 days, then  0.5 tabs x 3 days 01/28/15   Charlesetta Shanks, MD   BP 154/104 mmHg  Pulse 101  Temp(Src) 98.6 F (37 C) (Oral)  Resp 18  Ht 5\' 4"  (1.626 m)  Wt 208 lb (94.348 kg)  BMI 35.69 kg/m2  SpO2 100% Physical Exam  Constitutional: She is oriented to person, place, and time. She appears well-developed and well-nourished.  HENT:  Head: Normocephalic and atraumatic.  Eyes: EOM are normal. Pupils are equal, round, and reactive to light.  Neck: Neck supple.  Cardiovascular: Normal rate, regular rhythm, normal heart sounds and intact distal pulses.   Pulmonary/Chest: Effort normal and breath sounds normal.  Abdominal: Soft. Bowel sounds are normal. She exhibits no distension. There is no tenderness.  Musculoskeletal:  Normal range of motion. She exhibits edema and tenderness.  Forefoot and calf are soft and nontender. Patient does not have tenderness to palpation in the sole of the foot over the arch. Heme is localized over the first metatarsal joint on the right foot. Patient has pain to light touch or any movement of the joint. Remainder of joints are normal.  Neurological: She is alert and oriented to person, place, and time. She has normal strength. Coordination normal. GCS eye subscore is 4. GCS verbal subscore is 5. GCS motor subscore is 6.  Skin: Skin is warm, dry and intact.  Psychiatric: She has a normal mood and affect.        ED Course  Procedures (including critical care time) Labs Review Labs Reviewed - No data to display  Imaging Review No results found. I have personally reviewed and evaluated these images and lab results as part of my medical decision-making.   EKG Interpretation None      MDM   Final diagnoses:  Acute gout of right foot, unspecified cause   Findings consistent with gout. Classic location without any other associated symptoms. Patient is otherwise well. She has positive family history in 2 first-degree relatives. Patient's renal function was normal in November per EMR review. She however reports problems with blood pressure management requiring 3 medications. She has no signs of endorgan damage on review of systems. However due to hypertension,  the patient will be placed on prednisone rather than NSAID. She will be given Percocet for additional pain control. Patient is counseled to follow-up with her family physician.    Charlesetta Shanks, MD 01/27/15 1106

## 2015-01-27 NOTE — Discharge Instructions (Signed)

## 2015-02-09 ENCOUNTER — Encounter (HOSPITAL_COMMUNITY): Payer: Self-pay | Admitting: Emergency Medicine

## 2015-02-09 ENCOUNTER — Observation Stay (HOSPITAL_COMMUNITY)
Admission: EM | Admit: 2015-02-09 | Discharge: 2015-02-12 | Disposition: A | Discharge status: Home / Self Care | Payer: BC Managed Care – PPO | Attending: Internal Medicine | Admitting: Internal Medicine

## 2015-02-09 ENCOUNTER — Emergency Department (HOSPITAL_COMMUNITY): Payer: BC Managed Care – PPO

## 2015-02-09 DIAGNOSIS — K219 Gastro-esophageal reflux disease without esophagitis: Secondary | ICD-10-CM | POA: Diagnosis not present

## 2015-02-09 DIAGNOSIS — Z6836 Body mass index (BMI) 36.0-36.9, adult: Secondary | ICD-10-CM | POA: Diagnosis not present

## 2015-02-09 DIAGNOSIS — I10 Essential (primary) hypertension: Secondary | ICD-10-CM | POA: Diagnosis present

## 2015-02-09 DIAGNOSIS — R9431 Abnormal electrocardiogram [ECG] [EKG]: Secondary | ICD-10-CM | POA: Diagnosis not present

## 2015-02-09 DIAGNOSIS — I209 Angina pectoris, unspecified: Secondary | ICD-10-CM | POA: Diagnosis not present

## 2015-02-09 DIAGNOSIS — R9439 Abnormal result of other cardiovascular function study: Secondary | ICD-10-CM | POA: Insufficient documentation

## 2015-02-09 DIAGNOSIS — R079 Chest pain, unspecified: Secondary | ICD-10-CM | POA: Diagnosis present

## 2015-02-09 DIAGNOSIS — G4733 Obstructive sleep apnea (adult) (pediatric): Secondary | ICD-10-CM | POA: Diagnosis present

## 2015-02-09 DIAGNOSIS — Z79899 Other long term (current) drug therapy: Secondary | ICD-10-CM | POA: Diagnosis not present

## 2015-02-09 DIAGNOSIS — N390 Urinary tract infection, site not specified: Secondary | ICD-10-CM | POA: Insufficient documentation

## 2015-02-09 DIAGNOSIS — E119 Type 2 diabetes mellitus without complications: Secondary | ICD-10-CM | POA: Insufficient documentation

## 2015-02-09 DIAGNOSIS — E876 Hypokalemia: Secondary | ICD-10-CM | POA: Diagnosis present

## 2015-02-09 DIAGNOSIS — D649 Anemia, unspecified: Secondary | ICD-10-CM | POA: Insufficient documentation

## 2015-02-09 DIAGNOSIS — I119 Hypertensive heart disease without heart failure: Secondary | ICD-10-CM | POA: Diagnosis not present

## 2015-02-09 DIAGNOSIS — E669 Obesity, unspecified: Secondary | ICD-10-CM | POA: Diagnosis not present

## 2015-02-09 DIAGNOSIS — R072 Precordial pain: Secondary | ICD-10-CM | POA: Diagnosis not present

## 2015-02-09 DIAGNOSIS — R55 Syncope and collapse: Principal | ICD-10-CM | POA: Diagnosis present

## 2015-02-09 DIAGNOSIS — E538 Deficiency of other specified B group vitamins: Secondary | ICD-10-CM | POA: Insufficient documentation

## 2015-02-09 DIAGNOSIS — E1122 Type 2 diabetes mellitus with diabetic chronic kidney disease: Secondary | ICD-10-CM

## 2015-02-09 DIAGNOSIS — N182 Chronic kidney disease, stage 2 (mild): Secondary | ICD-10-CM

## 2015-02-09 HISTORY — DX: Gout, unspecified: M10.9

## 2015-02-09 HISTORY — DX: Syncope and collapse: R55

## 2015-02-09 HISTORY — DX: Other complications of anesthesia, initial encounter: T88.59XA

## 2015-02-09 HISTORY — DX: Gastro-esophageal reflux disease without esophagitis: K21.9

## 2015-02-09 HISTORY — DX: Adverse effect of unspecified anesthetic, initial encounter: T41.45XA

## 2015-02-09 LAB — BASIC METABOLIC PANEL
Anion gap: 11 (ref 5–15)
BUN: 14 mg/dL (ref 6–20)
CALCIUM: 8.5 mg/dL — AB (ref 8.9–10.3)
CO2: 27 mmol/L (ref 22–32)
CREATININE: 1.14 mg/dL — AB (ref 0.44–1.00)
Chloride: 101 mmol/L (ref 101–111)
GFR calc non Af Amer: 54 mL/min — ABNORMAL LOW (ref >60)
Glucose, Bld: 108 mg/dL — ABNORMAL HIGH (ref 65–99)
Potassium: 3.1 mmol/L — ABNORMAL LOW (ref 3.5–5.1)
SODIUM: 139 mmol/L (ref 135–145)

## 2015-02-09 LAB — CBC
HCT: 32.8 % — ABNORMAL LOW (ref 36.0–46.0)
Hemoglobin: 10.9 g/dL — ABNORMAL LOW (ref 12.0–15.0)
MCH: 29 pg (ref 26.0–34.0)
MCHC: 33.2 g/dL (ref 30.0–36.0)
MCV: 87.2 fL (ref 78.0–100.0)
Platelets: 321 10*3/uL (ref 150–400)
RBC: 3.76 MIL/uL — AB (ref 3.87–5.11)
RDW: 15.4 % (ref 11.5–15.5)
WBC: 7.3 10*3/uL (ref 4.0–10.5)

## 2015-02-09 LAB — I-STAT TROPONIN, ED
TROPONIN I, POC: 0 ng/mL (ref 0.00–0.08)
Troponin i, poc: 0.01 ng/mL (ref 0.00–0.08)

## 2015-02-09 LAB — URINALYSIS, ROUTINE W REFLEX MICROSCOPIC
BILIRUBIN URINE: NEGATIVE
GLUCOSE, UA: NEGATIVE mg/dL
HGB URINE DIPSTICK: NEGATIVE
KETONES UR: NEGATIVE mg/dL
NITRITE: POSITIVE — AB
PH: 6 (ref 5.0–8.0)
Protein, ur: NEGATIVE mg/dL
SPECIFIC GRAVITY, URINE: 1.013 (ref 1.005–1.030)

## 2015-02-09 LAB — CBG MONITORING, ED: Glucose-Capillary: 68 mg/dL (ref 65–99)

## 2015-02-09 LAB — URINE MICROSCOPIC-ADD ON: RBC / HPF: NONE SEEN RBC/hpf (ref 0–5)

## 2015-02-09 LAB — TROPONIN I: Troponin I: <0.03 ng/mL (ref <0.031)

## 2015-02-09 MED ORDER — SODIUM CHLORIDE 0.9 % IV BOLUS (SEPSIS)
1000.0000 mL | Freq: Once | INTRAVENOUS | Status: AC
  Administered 2015-02-09: 1000 mL via INTRAVENOUS

## 2015-02-09 MED ORDER — ENOXAPARIN SODIUM 40 MG/0.4ML ~~LOC~~ SOLN
40.0000 mg | SUBCUTANEOUS | Status: DC
  Administered 2015-02-09 – 2015-02-10 (×2): 40 mg via SUBCUTANEOUS
  Filled 2015-02-09 (×2): qty 0.4

## 2015-02-09 MED ORDER — SODIUM CHLORIDE 0.9 % IJ SOLN
3.0000 mL | Freq: Two times a day (BID) | INTRAMUSCULAR | Status: DC
  Administered 2015-02-09 – 2015-02-12 (×4): 3 mL via INTRAVENOUS

## 2015-02-09 MED ORDER — OLMESARTAN-AMLODIPINE-HCTZ 40-10-25 MG PO TABS: 1.0000 | ORAL_TABLET | Freq: Every day | ORAL | Status: DC

## 2015-02-09 MED ORDER — ASPIRIN 81 MG PO CHEW
324.0000 mg | CHEWABLE_TABLET | Freq: Once | ORAL | Status: AC
  Administered 2015-02-09: 324 mg via ORAL
  Filled 2015-02-09: qty 4

## 2015-02-09 MED ORDER — IRBESARTAN 300 MG PO TABS
300.0000 mg | ORAL_TABLET | Freq: Every day | ORAL | Status: DC
  Administered 2015-02-10 – 2015-02-12 (×3): 300 mg via ORAL
  Filled 2015-02-09 (×3): qty 1

## 2015-02-09 MED ORDER — ACETAMINOPHEN 325 MG PO TABS
650.0000 mg | ORAL_TABLET | ORAL | Status: DC | PRN
  Administered 2015-02-10 – 2015-02-11 (×3): 650 mg via ORAL
  Filled 2015-02-09 (×3): qty 2

## 2015-02-09 MED ORDER — POTASSIUM CHLORIDE CRYS ER 20 MEQ PO TBCR
40.0000 meq | EXTENDED_RELEASE_TABLET | Freq: Once | ORAL | Status: AC
Start: 2015-02-09 — End: 2015-02-09
  Administered 2015-02-09: 40 meq via ORAL
  Filled 2015-02-09: qty 2

## 2015-02-09 MED ORDER — PANTOPRAZOLE SODIUM 40 MG PO TBEC
40.0000 mg | DELAYED_RELEASE_TABLET | Freq: Two times a day (BID) | ORAL | Status: DC
  Administered 2015-02-09 – 2015-02-12 (×6): 40 mg via ORAL
  Filled 2015-02-09 (×6): qty 1

## 2015-02-09 MED ORDER — CLONIDINE HCL 0.1 MG PO TABS
0.1000 mg | ORAL_TABLET | Freq: Three times a day (TID) | ORAL | Status: DC
  Administered 2015-02-09 – 2015-02-12 (×7): 0.1 mg via ORAL
  Filled 2015-02-09 (×8): qty 1

## 2015-02-09 MED ORDER — HYDROCHLOROTHIAZIDE 25 MG PO TABS
25.0000 mg | ORAL_TABLET | Freq: Every day | ORAL | Status: DC
  Administered 2015-02-10: 25 mg via ORAL
  Filled 2015-02-09: qty 1

## 2015-02-09 MED ORDER — DEXTROSE 5 % IV SOLN
1.0000 g | INTRAVENOUS | Status: DC
  Administered 2015-02-09 – 2015-02-11 (×3): 1 g via INTRAVENOUS
  Filled 2015-02-09 (×4): qty 10

## 2015-02-09 MED ORDER — AMLODIPINE BESYLATE 10 MG PO TABS
10.0000 mg | ORAL_TABLET | Freq: Every day | ORAL | Status: DC
  Administered 2015-02-10 – 2015-02-12 (×3): 10 mg via ORAL
  Filled 2015-02-09 (×3): qty 1

## 2015-02-09 MED ORDER — METOPROLOL SUCCINATE ER 100 MG PO TB24
100.0000 mg | ORAL_TABLET | Freq: Every day | ORAL | Status: DC
  Administered 2015-02-10 – 2015-02-12 (×3): 100 mg via ORAL
  Filled 2015-02-09 (×3): qty 1

## 2015-02-09 MED ORDER — DEXTROSE-NACL 5-0.45 % IV SOLN
INTRAVENOUS | Status: DC
  Administered 2015-02-09 – 2015-02-11 (×4): via INTRAVENOUS

## 2015-02-09 NOTE — ED Notes (Signed)
EKG completed given to EDP.  

## 2015-02-09 NOTE — ED Notes (Signed)
Onset one day ago having hot flashes. Today drove to work while at work ambulated to the bathroom after entering bathroom patient does not remember events.  Coworker found patient after patient called her on her cell phone. Patient in bathroom approximately 5-10 minutes. States pain forehead 9/10 achy throbbing. Alert answering and following commands appropriate.

## 2015-02-09 NOTE — H&P (Signed)
History and Physical  Leon Lusky [id number] DOB: 1960/02/26 DOA: 02/09/2015  PCP: Binnie Rail, MD   Chief Complaint: syncope  History of Present Illness:   Patient is a 55 yo female with history of HTN who came here with cc of syncope.She felt mild chest discomfort earlier today at work that she related to indigestion that was followed by feeling dizzy while she was walking to the bathroom. She found herself on the floor in the bathroom 5-10 min later with no tongue biting/incontinence and asked her friend to call EMS. She denied dyspnea. No cough/fever/chills/N/V/D/C/dysuria.  No other complaints. She said she has had occasional and sporadic episodes of sweating at night before and had a prior syncopal episode in the past.   Review of Systems:  CONSTITUTIONAL:  No night sweats.  No fatigue, malaise, lethargy.  No fever or chills. Eyes:  No visual changes.  No eye pain.  No eye discharge.   ENT:    No epistaxis.  No sinus pain.  No sore throat.  No ear pain.  No congestion. RESPIRATORY:  No cough.  No wheeze.  No hemoptysis.  No shortness of breath. CARDIOVASCULAR:  + chest pains. + dizzy GASTROINTESTINAL:  No abdominal pain.  No nausea or vomiting.  No diarrhea or constipation.  No hematemesis.  No hematochezia.  No melena. GENITOURINARY:  No urgency.  No frequency.  No dysuria.  No hematuria.  No obstructive symptoms.  No discharge.  No pain.  No significant abnormal bleeding. MUSCULOSKELETAL:  No musculoskeletal pain.  No joint swelling.  No arthritis. NEUROLOGICAL:  No confusion.  No weakness. No headache. No seizure. PSYCHIATRIC:  No depression. No anxiety. No suicidal ideation. SKIN:  No rashes.  No lesions.  No wounds. ENDOCRINE:  No unexplained weight loss.  No polydipsia.  No polyuria.  No polyphagia. HEMATOLOGIC:  No anemia.  No purpura.  No petechiae.  No bleeding.  ALLERGIC AND IMMUNOLOGIC:  No pruritus.  No swelling Other:  Past Medical and Surgical  History:   Past Medical History  Diagnosis Date  . Hypertension   . Headaches, cluster   . History of hyperglycemia 1993  . Diabetes mellitus, type 2 ([place]) dx 01/2011  . OSA (obstructive sleep apnea)   . EP (ectopic pregnancy)   . Candidiasis of mouth   . Sleep apnea     cpap use   . Gout   . Syncope 02/09/15   Past Surgical History  Procedure Laterality Date  . Tonsillectomy and adenoidectomy  2012  . Ectopic pregnancy surgery      left fallopian tube  . Myringotomy with tube placement    . Tubal ligation      Social History:   reports that she has never smoked. She has never used smokeless tobacco. She reports that she does not drink alcohol or use illicit drugs.   Allergies  Allergen Reactions  . Lexapro [Escitalopram] Itching  . Spironolactone     rash    Family History  Problem Relation Age of Onset  . Arthritis Sister   . Diabetes Mother   . Hypertension Mother   . Colon cancer Neg Hx   . Rectal cancer Neg Hx   . Stomach cancer Neg Hx       Prior to Admission medications   Medication Sig Start Date End Date Taking? Authorizing Provider  cloNIDine (CATAPRES) 0.1 MG tablet Take 1 tablet (0.1 mg total) by mouth 3 (three) times daily. 12/04/14  Yes Binnie Rail,  MD  metoprolol succinate (TOPROL-XL) 100 MG 24 hr tablet Take 1 tablet (100 mg total) by mouth daily. Take with or immediately following a meal. 12/01/14  Yes Binnie Rail, MD  Olmesartan-Amlodipine-HCTZ [facility]) 40-10-25 MG TABS Take 1 tablet by mouth daily. 07/08/14  Yes Rowe Clack, MD  Diclofenac Sodium 2 % SOLN Apply 1 pump twice daily. Patient not taking: Reported on 02/09/2015 01/15/15   Lyndal Pulley, DO  oxyCODONE-acetaminophen (PERCOCET) 5-325 MG tablet Take 1-2 tablets by mouth every 4 (four) hours as needed. Patient not taking: Reported on 02/09/2015 01/27/15   Charlesetta Shanks, MD  pantoprazole (PROTONIX) 40 MG tablet Take 1 tablet (40 mg total) by mouth 2 (two) times  daily. Patient taking differently: Take 40 mg by mouth as needed.  10/14/14   Laban Emperor Zehr, PA-C  predniSONE (DELTASONE) 20 MG tablet 3 tabs po daily x 3 days, then 2 tabs x 3 days, then 1.5 tabs x 3 days, then 1 tab x 3 days, then 0.5 tabs x 3 days Patient not taking: Reported on 02/09/2015 01/28/15   Charlesetta Shanks, MD    Physical Exam: BP 111/71 mmHg  Pulse 87  Temp(Src) 99.5 F (37.5 C) (Oral)  Resp 18  Ht 5\' 4"  (1.626 m)  Wt 94.802 kg (209 lb)  BMI 35.86 kg/m2  SpO2 99%  GENERAL : Well developed, well nourished, alert and cooperative, and appears to be in no acute distress. HEAD: normocephalic. EYES:  vision is grossly intact. EARS:  hearing grossly intact. NOSE: No nasal discharge. THROAT: Oral cavity and pharynx normal..  NECK: Neck supple CARDIAC: Normal S1 and S2. No S3, S4 or murmurs. Rhythm is regular. There is no peripheral edema, cyanosis or pallor. LUNGS: Clear to auscultation  ABDOMEN: Positive bowel sounds. Soft, nondistended, nontender. No guarding or rebound. No masses. NEUROLOGICAL: The mental examination revealed the patient was oriented to person, place, and time.CN II-XII intact.  SKIN: Skin normal color PSYCHIATRIC:  The patient was able to demonstrate good judgement and reason, without hallucinations, abnormal affect or abnormal behaviors during the examination. Patient is not suicidal.          Labs on Admission:  Reviewed.   Radiological Exams on Admission: Dg Chest 2 View  02/09/2015  CLINICAL DATA:  Dizziness.  Syncope. EXAM: CHEST  2 VIEW COMPARISON:  10/17/2014 FINDINGS: Dextroconvex thoracic scoliosis, mild.  Mild thoracic spondylosis. The lungs appear clear. Cardiac and mediastinal contours normal. No pleural effusion identified. IMPRESSION: 1. No acute findings. 2. Mild thoracic spondylosis and mild dextroconvex thoracic scoliosis. Electronically Signed   By: Van Clines M.D.   On: 02/09/2015 11:38   Ct Head Wo Contrast  02/09/2015   CLINICAL DATA:  Syncope, frontal head pain. Found prone on bathroom floor. EXAM: CT HEAD WITHOUT CONTRAST TECHNIQUE: Contiguous axial images were obtained from the base of the skull through the vertex without intravenous contrast. COMPARISON:  11/17/2009 and report from 02/27/2003 FINDINGS: The brainstem, cerebellum, cerebral peduncles, thalami, basal ganglia, basilar cisterns, and ventricular system appear within normal limits. No intracranial hemorrhage, mass lesion, or acute CVA. Chronic right maxillary sinusitis. Old right medial orbital wall fracture without extraocular muscle herniation. IMPRESSION: 1. No acute intracranial findings. 2. Old right medial orbital wall fracture. 3. Mild chronic right maxillary sinusitis. Electronically Signed   By: Van Clines M.D.   On: 02/09/2015 11:34    EKG:  Independently reviewed. likely suggestive of LVH.  Assessment/Plan  Syncope:  DDx:  - Cardiac : structural  heart disease ( AS, MS) or arrhythmia ( tachy or brady):    Get Echo. Keep on Tele. Rule out ACS with serial trops/EKG - syncope after changing position ( after standing up today) may suggest orthostatic hypotension.   Check orthostatics , cont IVF, tx for possible UTI. - Neurologic : seizure vs. CVA: unlikely.  - Vasovagal     Cont IVF.  Scheduled for stress test in am. Abnormal EKG. Chest pain free now.  HTN: cont home meds  Hypokalemia: replaced.   DVT prophylaxis: [place] enoxaparin GI prophylaxis:PPI Consultants: Cardio Code Status: Full    Gennaro Africa M.D [facility]

## 2015-02-09 NOTE — Consult Note (Signed)
Cardiologist: New Reason for Consult:  CP, syncope Referring Physician:   Sharna Gabrys is an 55 y.o. female.  HPI:   The patient is a 55 year old female history of syncope-2016, hypertension, diabetes mellitus obstructive sleep apnea, acid reflux.  The patient reports feeling "hot" frequently and sleeps with the windows open most of the time.  Today while at work her chest started hurting around 0830hrs, described as pressure/burning.  Around 1000hrs she went to the bathroom.  When she was going through the door she felt a little dizzy and once inside, she passed out.  She regained consciousness and called for help.  She reports having a similar syncopal episode last year and being evaluated here, however, I do not see any notes and it is not listed in her history.  She walks up 20-25 steps daily and denies having CP in the last week.  She also denies nausea, vomiting, fever, shortness of breath beyond baseline, orthopnea, PND, cough, congestion, abdominal pain, hematochezia, melena, lower extremity edema, claudication.   Past Medical History  Diagnosis Date  . Hypertension   . Headaches, cluster   . History of hyperglycemia 1993  . Diabetes mellitus, type 2 ([place]) dx 01/2011  . OSA (obstructive sleep apnea)   . EP (ectopic pregnancy)   . Candidiasis of mouth   . Sleep apnea     cpap use   . Gout     Past Surgical History  Procedure Laterality Date  . Tonsillectomy and adenoidectomy  2012  . Ectopic pregnancy surgery      left fallopian tube  . Myringotomy with tube placement    . Tubal ligation      Family History  Problem Relation Age of Onset  . Arthritis Sister   . Diabetes Mother   . Hypertension Mother   . Colon cancer Neg Hx   . Rectal cancer Neg Hx   . Stomach cancer Neg Hx     Social History:  reports that she has never smoked. She has never used smokeless tobacco. She reports that she does not drink alcohol or use illicit drugs.  Allergies:  Allergies    Allergen Reactions  . Lexapro [Escitalopram] Itching  . Spironolactone     rash    Medications:  Medication Sig  cloNIDine (CATAPRES) 0.1 MG tablet Take 1 tablet (0.1 mg total) by mouth 3 (three) times daily.  metoprolol succinate (TOPROL-XL) 100 MG 24 hr tablet Take 1 tablet (100 mg total) by mouth daily. Take with or immediately following a meal.  Olmesartan-Amlodipine-HCTZ (TRIBENZOR) 40-10-25 MG TABS Take 1 tablet by mouth daily.  Diclofenac Sodium 2 % SOLN Apply 1 pump twice daily. Patient not taking: Reported on 02/09/2015  oxyCODONE-acetaminophen (PERCOCET) 5-325 MG tablet Take 1-2 tablets by mouth every 4 (four) hours as needed. Patient not taking: Reported on 02/09/2015  pantoprazole (PROTONIX) 40 MG tablet Take 1 tablet (40 mg total) by mouth 2 (two) times daily. Patient taking differently: Take 40 mg by mouth as needed.   predniSONE (DELTASONE) 20 MG tablet 3 tabs po daily x 3 days, then 2 tabs x 3 days, then 1.5 tabs x 3 days, then 1 tab x 3 days, then 0.5 tabs x 3 days Patient not taking: Reported on 02/09/2015     Results for orders placed or performed during the hospital encounter of 02/09/15 (from the past 48 hour(s))  CBG monitoring, ED     Status: None   Collection Time: 02/09/15 11:36 AM  Result Value  Ref Range   Glucose-Capillary 68 65 - 99 mg/dL  Basic metabolic panel     Status: Abnormal   Collection Time: 02/09/15 12:07 PM  Result Value Ref Range   Sodium 139 135 - 145 mmol/L   Potassium 3.1 (L) 3.5 - 5.1 mmol/L   Chloride 101 101 - 111 mmol/L   CO2 27 22 - 32 mmol/L   Glucose, Bld 108 (H) 65 - 99 mg/dL   BUN 14 6 - 20 mg/dL   Creatinine, Ser 1.14 (H) 0.44 - 1.00 mg/dL   Calcium 8.5 (L) 8.9 - 10.3 mg/dL   GFR calc non Af Amer 54 (L) >60 mL/min   GFR calc Af Amer >60 >60 mL/min    Comment: (NOTE) The eGFR has been calculated using the CKD EPI equation. This calculation has not been validated in all clinical situations. eGFR's persistently <60 mL/min  signify possible Chronic Kidney Disease.    Anion gap 11 5 - 15  CBC     Status: Abnormal   Collection Time: 02/09/15 12:07 PM  Result Value Ref Range   WBC 7.3 4.0 - 10.5 K/uL   RBC 3.76 (L) 3.87 - 5.11 MIL/uL   Hemoglobin 10.9 (L) 12.0 - 15.0 g/dL   HCT 32.8 (L) 36.0 - 46.0 %   MCV 87.2 78.0 - 100.0 fL   MCH 29.0 26.0 - 34.0 pg   MCHC 33.2 30.0 - 36.0 g/dL   RDW 15.4 11.5 - 15.5 %   Platelets 321 150 - 400 K/uL  Urinalysis, Routine w reflex microscopic (not at [facility])     Status: Abnormal   Collection Time: 02/09/15 12:19 PM  Result Value Ref Range   Color, Urine YELLOW YELLOW   APPearance CLEAR CLEAR   Specific Gravity, Urine 1.013 1.005 - 1.030   pH 6.0 5.0 - 8.0   Glucose, UA NEGATIVE NEGATIVE mg/dL   Hgb urine dipstick NEGATIVE NEGATIVE   Bilirubin Urine NEGATIVE NEGATIVE   Ketones, ur NEGATIVE NEGATIVE mg/dL   Protein, ur NEGATIVE NEGATIVE mg/dL   Nitrite POSITIVE (A) NEGATIVE   Leukocytes, UA TRACE (A) NEGATIVE  Urine microscopic-add on     Status: Abnormal   Collection Time: 02/09/15 12:19 PM  Result Value Ref Range   Squamous Epithelial / LPF 0-5 (A) NONE SEEN   WBC, UA 6-30 0 - 5 WBC/hpf   RBC / HPF NONE SEEN 0 - 5 RBC/hpf   Bacteria, UA MANY (A) NONE SEEN   Casts HYALINE CASTS (A) NEGATIVE  I-Stat Troponin, ED (not at [facility])     Status: None   Collection Time: 02/09/15 12:33 PM  Result Value Ref Range   Troponin i, poc 0.01 0.00 - 0.08 ng/mL   Comment 3            Comment: Due to the release kinetics of cTnI, a negative result within the first hours of the onset of symptoms does not rule out myocardial infarction with certainty. If myocardial infarction is still suspected, repeat the test at appropriate intervals.   I-Stat Troponin, ED (not at [facility])     Status: None   Collection Time: 02/09/15  2:50 PM  Result Value Ref Range   Troponin i, poc 0.00 0.00 - 0.08 ng/mL   Comment 3            Comment: Due to the release kinetics of cTnI, a negative result  within the first hours of the onset of symptoms does not rule out myocardial infarction with certainty.  If myocardial infarction is still suspected, repeat the test at appropriate intervals.     Dg Chest 2 View  02/09/2015  CLINICAL DATA:  Dizziness.  Syncope. EXAM: CHEST  2 VIEW COMPARISON:  10/17/2014 FINDINGS: Dextroconvex thoracic scoliosis, mild.  Mild thoracic spondylosis. The lungs appear clear. Cardiac and mediastinal contours normal. No pleural effusion identified. IMPRESSION: 1. No acute findings. 2. Mild thoracic spondylosis and mild dextroconvex thoracic scoliosis. Electronically Signed   By: Van Clines M.D.   On: 02/09/2015 11:38   Ct Head Wo Contrast  02/09/2015  CLINICAL DATA:  Syncope, frontal head pain. Found prone on bathroom floor. EXAM: CT HEAD WITHOUT CONTRAST TECHNIQUE: Contiguous axial images were obtained from the base of the skull through the vertex without intravenous contrast. COMPARISON:  11/17/2009 and report from 02/27/2003 FINDINGS: The brainstem, cerebellum, cerebral peduncles, thalami, basal ganglia, basilar cisterns, and ventricular system appear within normal limits. No intracranial hemorrhage, mass lesion, or acute CVA. Chronic right maxillary sinusitis. Old right medial orbital wall fracture without extraocular muscle herniation. IMPRESSION: 1. No acute intracranial findings. 2. Old right medial orbital wall fracture. 3. Mild chronic right maxillary sinusitis. Electronically Signed   By: Van Clines M.D.   On: 02/09/2015 11:34    Review of Systems  Constitutional: Negative for fever, chills and diaphoresis.  HENT: Negative for congestion.   Respiratory: Negative for cough and shortness of breath.   Cardiovascular: Positive for chest pain. Negative for palpitations, orthopnea, leg swelling and PND.  Gastrointestinal: Negative for nausea, vomiting, abdominal pain, blood in stool and melena.  Musculoskeletal: Positive for falls.  Neurological:  Positive for dizziness and loss of consciousness.  All other systems reviewed and are negative.  Blood pressure 134/82, pulse 98, temperature 99.5 F (37.5 C), temperature source Oral, resp. rate 16, height _0  (1.626 m), weight 209 lb (94.802 kg), SpO2 99 %. Physical Exam  Nursing note and vitals reviewed. Constitutional: She is oriented to person, place, and time. She appears well-developed. No distress.  Obese   HENT:  Head: Normocephalic and atraumatic.  Mouth/Throat: No oropharyngeal exudate.  Eyes: Conjunctivae are normal. Pupils are equal, round, and reactive to light. No scleral icterus.  Neck: Normal range of motion. Neck supple. No JVD present.  Cardiovascular: Normal rate, regular rhythm, S1 normal and S2 normal.   No murmur heard. Pulses:      Radial pulses are 2+ on the right side, and 2+ on the left side.       Dorsalis pedis pulses are 2+ on the right side, and 2+ on the left side.  No Carotid bruits  Respiratory: Effort normal and breath sounds normal. She has no wheezes. She has no rales.  GI: Soft. Bowel sounds are normal. She exhibits no distension. There is no tenderness.  Musculoskeletal: She exhibits no edema.  Lymphadenopathy:    She has no cervical adenopathy.  Neurological: She is alert and oriented to person, place, and time. She exhibits normal muscle tone.  Skin: Skin is warm and dry.  Psychiatric: She has a normal mood and affect.    Assessment/Plan: Principal Problem:   Syncope and collapse Active Problems:   Hypertension   Diabetes mellitus, type 2 (HCC)   Obese   OSA (obstructive sleep apnea)   Chest pain   Hypokalemia   UTI  55 year old female history of syncope-2016, hypertension, diabetes mellitus, obstructive sleep apnea, gout who presented with syncope and collapse.  EKG is sinus rhythm with lateral TWI, LVH suggests ischemia or repolarization  changes.  She had CP this morning, which she attributes to acid reflux.  She's be learning  what she can and can not eat to avoid triggering it.  She walks up 20-25 stairs everyday without CP which makes me think the CP this morning is not cardiac as well.  Troponin is negative x2 which also makes me think she did not have an arrhythmia to cause syncope either.  No incontinence of bowel or urine.  She was a little dizzy before syncope.  No bruits.  Check an echocardiogram.  Monitor on Telemetry.  Consider cardiac stress test in the office.  Tarri Fuller, [facility] 02/09/2015, 4:02 PM   The patient was seen, examined and discussed with Tarri Fuller, PA-C and I agree with the above.   55 year old female with recurrent syncope, the first one in 2014,  this time at rest she developed mild chest pain 2/10, felt like GERD, she tried to go to the bathroom when she passed out. She denies palpitations or SOB. Now asymptomatic. No FH of premature CAD. She has sedentary job, but is otherwise fairly active and has no CP on physical activity. She has h/o difficult to control hypertension.  We will schedule an exercise nuclear stress test in the morning and echocardiogram. Her lipids in November were just borderline for LDL.   Dorothy Spark 02/09/2015

## 2015-02-09 NOTE — ED Provider Notes (Signed)
CSN: [id number]     Arrival date & time 02/09/15  1047 History   First MD Initiated Contact with Patient 02/09/15 1059     Chief Complaint  Patient presents with  . Loss of Consciousness     (Consider location/radiation/quality/duration/timing/severity/associated sxs/prior Treatment) HPI Nancy Acevedo is a 55 y.o. female history hypertension, diabetes, gout, presents to emergency department after syncopal episode. Patient states that she had some hot flashes last night. States she felt well this morning and drove to work. She reports she was sitting at her desk at work, and her coworker asked if she was all right. Patient stated that she did have mild chest pain at that time but thought there was acid reflux. Patient then felt like she had to go to the bathroom, she got up and walked to the restroom, she remembers entering the restroom and being slightly ataxic and the next thing she remembers is waking up on the floor. She states she found her phone near her and called her coworker who came and found her on the floor of the bathroom. Patient reports headache since this episode. She does not remember feeling lightheaded or like she is going to pass out, but she does remember that she had to hold onto the door as she walked into the bathroom because she did not feel right. She denies any current chest pain. She denies any dizziness or lightheadedness. She states the headache is frontal. She is unsure if she hit her head when she syncopized. She denies any swelling in her extremities. She denies any recent illnesses. No urinary symptoms. No other complaints. She reports prior syncopal episode approximately 4 years ago with unknown cause at that time.  Past Medical History  Diagnosis Date  . Hypertension   . Headaches, cluster   . History of hyperglycemia 1993  . Diabetes mellitus, type 2 ([place]) dx 01/2011  . OSA (obstructive sleep apnea)   . EP (ectopic pregnancy)   . Candidiasis of mouth   .  Sleep apnea     cpap use   . Gout    Past Surgical History  Procedure Laterality Date  . Tonsillectomy and adenoidectomy  2012  . Ectopic pregnancy surgery      left fallopian tube  . Myringotomy with tube placement    . Tubal ligation     Family History  Problem Relation Age of Onset  . Arthritis Sister   . Diabetes Mother   . Hypertension Mother   . Colon cancer Neg Hx   . Rectal cancer Neg Hx   . Stomach cancer Neg Hx    Social History  Substance Use Topics  . Smoking status: Never Smoker   . Smokeless tobacco: Never Used  . Alcohol Use: No   OB History    No data available     Review of Systems  Constitutional: Negative for fever and chills.  Respiratory: Positive for chest tightness. Negative for cough and shortness of breath.   Cardiovascular: Positive for chest pain. Negative for palpitations and leg swelling.  Gastrointestinal: Negative for nausea, vomiting, abdominal pain and diarrhea.  Genitourinary: Negative for dysuria, flank pain, vaginal bleeding, vaginal discharge, vaginal pain and pelvic pain.  Musculoskeletal: Negative for myalgias, arthralgias, neck pain and neck stiffness.  Skin: Negative for rash.  Neurological: Positive for syncope and headaches. Negative for dizziness and weakness.  All other systems reviewed and are negative.     Allergies  Lexapro and Spironolactone  Home Medications   Prior  to Admission medications   Medication Sig Start Date End Date Taking? Authorizing Provider  cloNIDine (CATAPRES) 0.1 MG tablet Take 1 tablet (0.1 mg total) by mouth 3 (three) times daily. 12/04/14   Binnie Rail, MD  Diclofenac Sodium 2 % SOLN Apply 1 pump twice daily. 01/15/15   Lyndal Pulley, DO  metoprolol succinate (TOPROL-XL) 100 MG 24 hr tablet Take 1 tablet (100 mg total) by mouth daily. Take with or immediately following a meal. 12/01/14   Binnie Rail, MD  Olmesartan-Amlodipine-HCTZ [facility]) 40-10-25 MG TABS Take 1 tablet by mouth  daily. 07/08/14   Rowe Clack, MD  oxyCODONE-acetaminophen (PERCOCET) 5-325 MG tablet Take 1-2 tablets by mouth every 4 (four) hours as needed. 01/27/15   Charlesetta Shanks, MD  pantoprazole (PROTONIX) 40 MG tablet Take 1 tablet (40 mg total) by mouth 2 (two) times daily. Patient taking differently: Take 40 mg by mouth as needed.  10/14/14   Laban Emperor Zehr, PA-C  predniSONE (DELTASONE) 20 MG tablet 3 tabs po daily x 3 days, then 2 tabs x 3 days, then 1.5 tabs x 3 days, then 1 tab x 3 days, then 0.5 tabs x 3 days 01/28/15   Charlesetta Shanks, MD   BP 116/76 mmHg  Pulse 98  Temp(Src) 99.5 F (37.5 C) (Oral)  Resp 18  Ht 5\' 4"  (1.626 m)  Wt 94.802 kg  BMI 35.86 kg/m2  SpO2 99% Physical Exam  Constitutional: She is oriented to person, place, and time. She appears well-developed and well-nourished. No distress.  HENT:  Head: Normocephalic.  Eyes: Conjunctivae and EOM are normal. Pupils are equal, round, and reactive to light.  Neck: Normal range of motion. Neck supple.  Cardiovascular: Normal rate, regular rhythm and normal heart sounds.   Pulmonary/Chest: Effort normal and breath sounds normal. No respiratory distress. She has no wheezes. She has no rales.  Abdominal: Soft. Bowel sounds are normal. She exhibits no distension. There is no tenderness. There is no rebound.  Musculoskeletal: She exhibits no edema.  Neurological: She is alert and oriented to person, place, and time. No cranial nerve deficit. Coordination normal.  5/5 and equal upper and lower extremity strength bilaterally. Equal grip strength bilaterally. Normal finger to nose and heel to shin. No pronator drift.   Skin: Skin is warm and dry.  Psychiatric: She has a normal mood and affect. Her behavior is normal.  Nursing note and vitals reviewed.   ED Course  Procedures (including critical care time) Labs Review Labs Reviewed  BASIC METABOLIC PANEL - Abnormal; Notable for the following:    Potassium 3.1 (*)    Glucose,  Bld 108 (*)    Creatinine, Ser 1.14 (*)    Calcium 8.5 (*)    GFR calc non Af Amer 54 (*)    All other components within normal limits  CBC - Abnormal; Notable for the following:    RBC 3.76 (*)    Hemoglobin 10.9 (*)    HCT 32.8 (*)    All other components within normal limits  URINALYSIS, ROUTINE W REFLEX MICROSCOPIC (NOT AT [facility]) - Abnormal; Notable for the following:    Nitrite POSITIVE (*)    Leukocytes, UA TRACE (*)    All other components within normal limits  URINE MICROSCOPIC-ADD ON - Abnormal; Notable for the following:    Squamous Epithelial / LPF 0-5 (*)    Bacteria, UA MANY (*)    Casts HYALINE CASTS (*)    All other components within  normal limits  URINE CULTURE  CBG MONITORING, ED  I-STAT TROPOININ, ED  Randolm Idol, ED    Imaging Review Dg Chest 2 View  02/09/2015  CLINICAL DATA:  Dizziness.  Syncope. EXAM: CHEST  2 VIEW COMPARISON:  10/17/2014 FINDINGS: Dextroconvex thoracic scoliosis, mild.  Mild thoracic spondylosis. The lungs appear clear. Cardiac and mediastinal contours normal. No pleural effusion identified. IMPRESSION: 1. No acute findings. 2. Mild thoracic spondylosis and mild dextroconvex thoracic scoliosis. Electronically Signed   By: Van Clines M.D.   On: 02/09/2015 11:38   Ct Head Wo Contrast  02/09/2015  CLINICAL DATA:  Syncope, frontal head pain. Found prone on bathroom floor. EXAM: CT HEAD WITHOUT CONTRAST TECHNIQUE: Contiguous axial images were obtained from the base of the skull through the vertex without intravenous contrast. COMPARISON:  11/17/2009 and report from 02/27/2003 FINDINGS: The brainstem, cerebellum, cerebral peduncles, thalami, basal ganglia, basilar cisterns, and ventricular system appear within normal limits. No intracranial hemorrhage, mass lesion, or acute CVA. Chronic right maxillary sinusitis. Old right medial orbital wall fracture without extraocular muscle herniation. IMPRESSION: 1. No acute intracranial findings. 2.  Old right medial orbital wall fracture. 3. Mild chronic right maxillary sinusitis. Electronically Signed   By: Van Clines M.D.   On: 02/09/2015 11:34   I have personally reviewed and evaluated these images and lab results as part of my medical decision-making.   EKG Interpretation   Date/Time:  Monday February 09 2015 11:00:15 EST Ventricular Rate:  100 PR Interval:  148 QRS Duration: 88 QT Interval:  330 QTC Calculation: 426 R Axis:   20 Text Interpretation:  Sinus tachycardia Probable left atrial enlargement  Probable left ventricular hypertrophy Anterior Q waves, possibly due to  LVH st elevation v1-v3, present on 03-25-12 but more pronounced on this  tracing Confirmed by RAY MD, Andee Poles [phone]) on 02/09/2015 1:59:48 PM      MDM   Final diagnoses:  Syncope, unspecified syncope type  Hypokalemia  Chest pain, unspecified chest pain type  UTI (lower urinary tract infection)    patient in emergency department after syncopal episode. She currently reports headache, otherwise she is in no acute distress, she does not appear to be acutely ill. Vital signs are normal. Will check EKG, chest x-ray, CT head, labs.  2:06 PM Patient's labs all unremarkable except for potassium of 3.1, creatinine 1.14. Hemoglobin 10.9. Urine shows infection. I pulled up EKG, concerning for some ST elevation in anterior leads. Patient denies any chest pain at this time. She again said that she had some earlier today but no chest pain at present. She does not have any pleuritic symptoms. On the monitor is no tachycardia, no hypoxia, do not think she had a PE. She denies any swelling or pain in her legs. I spoke with cardiology regarding EKG, ordered aspirin. We'll continue to monitor.  4:08 PM  Pt waiting on cardiology consult. VS remain normal. Pt is in NAD.  No CP.  4:58 PM Pt seen by cardiology. i reviewed their consult note. Spoke with [facility], will admit.   Filed Vitals:   02/09/15 1445 02/09/15  1515 02/09/15 1530 02/09/15 1545  BP: 112/76 128/80 122/80 134/82  Pulse: 92 87 85 98  Temp:      TempSrc:      Resp: 19 15 16 16   Height:      Weight:      SpO2: 97% 99% 98% 99%     Jeannett Senior, PA-C 02/09/15 1659  Pattricia Boss, MD 02/11/15  1252 

## 2015-02-10 ENCOUNTER — Observation Stay (HOSPITAL_COMMUNITY): Payer: BC Managed Care – PPO

## 2015-02-10 ENCOUNTER — Encounter (HOSPITAL_COMMUNITY): Payer: Self-pay | Admitting: General Practice

## 2015-02-10 ENCOUNTER — Other Ambulatory Visit (HOSPITAL_COMMUNITY): Payer: BC Managed Care – PPO

## 2015-02-10 ENCOUNTER — Observation Stay (HOSPITAL_BASED_OUTPATIENT_CLINIC_OR_DEPARTMENT_OTHER): Payer: BC Managed Care – PPO

## 2015-02-10 DIAGNOSIS — R079 Chest pain, unspecified: Secondary | ICD-10-CM | POA: Diagnosis not present

## 2015-02-10 DIAGNOSIS — I209 Angina pectoris, unspecified: Secondary | ICD-10-CM

## 2015-02-10 DIAGNOSIS — I119 Hypertensive heart disease without heart failure: Secondary | ICD-10-CM | POA: Diagnosis not present

## 2015-02-10 DIAGNOSIS — E669 Obesity, unspecified: Secondary | ICD-10-CM

## 2015-02-10 DIAGNOSIS — R55 Syncope and collapse: Secondary | ICD-10-CM | POA: Diagnosis not present

## 2015-02-10 LAB — COMPREHENSIVE METABOLIC PANEL
ALBUMIN: 2.9 g/dL — AB (ref 3.5–5.0)
ALK PHOS: 39 U/L (ref 38–126)
ALT: 32 U/L (ref 14–54)
AST: 29 U/L (ref 15–41)
Anion gap: 11 (ref 5–15)
BILIRUBIN TOTAL: 0.5 mg/dL (ref 0.3–1.2)
BUN: 13 mg/dL (ref 6–20)
CALCIUM: 8.4 mg/dL — AB (ref 8.9–10.3)
CO2: 26 mmol/L (ref 22–32)
CREATININE: 1.16 mg/dL — AB (ref 0.44–1.00)
Chloride: 104 mmol/L (ref 101–111)
GFR calc Af Amer: >60 mL/min (ref >60)
GFR calc non Af Amer: 53 mL/min — ABNORMAL LOW (ref >60)
GLUCOSE: 128 mg/dL — AB (ref 65–99)
Potassium: 3.9 mmol/L (ref 3.5–5.1)
Sodium: 141 mmol/L (ref 135–145)
TOTAL PROTEIN: 5.7 g/dL — AB (ref 6.5–8.1)

## 2015-02-10 LAB — CBC WITH DIFFERENTIAL/PLATELET
BASOS ABS: 0 10*3/uL (ref 0.0–0.1)
BASOS PCT: 0 %
EOS PCT: 2 %
Eosinophils Absolute: 0.1 10*3/uL (ref 0.0–0.7)
HCT: 29.2 % — ABNORMAL LOW (ref 36.0–46.0)
Hemoglobin: 9.4 g/dL — ABNORMAL LOW (ref 12.0–15.0)
Lymphocytes Relative: 28 %
Lymphs Abs: 1.4 10*3/uL (ref 0.7–4.0)
MCH: 28 pg (ref 26.0–34.0)
MCHC: 32.2 g/dL (ref 30.0–36.0)
MCV: 86.9 fL (ref 78.0–100.0)
MONO ABS: 0.5 10*3/uL (ref 0.1–1.0)
MONOS PCT: 10 %
Neutro Abs: 3 10*3/uL (ref 1.7–7.7)
Neutrophils Relative %: 60 %
PLATELETS: 247 10*3/uL (ref 150–400)
RBC: 3.36 MIL/uL — AB (ref 3.87–5.11)
RDW: 15.4 % (ref 11.5–15.5)
WBC: 5 10*3/uL (ref 4.0–10.5)

## 2015-02-10 LAB — NM MYOCAR MULTI W/SPECT W/WALL MOTION / EF
Estimated workload: 7.1 METS
Exercise duration (min): 6 min
Exercise duration (sec): 0 s
LV dias vol: 82 mL
LV sys vol: 34 mL
MPHR: 167 {beats}/min
Peak HR: 157 {beats}/min
Percent HR: 94 %
RATE: 0.14
RPE: 15
Rest HR: 73 {beats}/min
SDS: 3
SRS: 2
SSS: 5
TID: 1.3

## 2015-02-10 LAB — GLUCOSE, CAPILLARY: GLUCOSE-CAPILLARY: 108 mg/dL — AB (ref 65–99)

## 2015-02-10 MED ORDER — ASPIRIN 81 MG PO CHEW
81.0000 mg | CHEWABLE_TABLET | ORAL | Status: AC
Start: 2015-02-11 — End: 2015-02-11
  Administered 2015-02-11: 81 mg via ORAL
  Filled 2015-02-10: qty 1

## 2015-02-10 MED ORDER — SODIUM CHLORIDE 0.9 % WEIGHT BASED INFUSION: 1.0000 mL/kg/h | INTRAVENOUS | Status: DC

## 2015-02-10 MED ORDER — SODIUM CHLORIDE 0.9% FLUSH: 3.0000 mL | INTRAVENOUS | Status: DC | PRN

## 2015-02-10 MED ORDER — AMLODIPINE BESYLATE 10 MG PO TABS: 10.0000 mg | ORAL_TABLET | Freq: Every day | ORAL | Status: AC

## 2015-02-10 MED ORDER — IRBESARTAN 300 MG PO TABS: 300.0000 mg | ORAL_TABLET | Freq: Every day | ORAL | Status: DC

## 2015-02-10 MED ORDER — FAMOTIDINE 40 MG PO TABS: 40.0000 mg | ORAL_TABLET | Freq: Every day | ORAL | Status: DC

## 2015-02-10 MED ORDER — SODIUM CHLORIDE 0.9% FLUSH
3.0000 mL | Freq: Two times a day (BID) | INTRAVENOUS | Status: DC
  Administered 2015-02-11: 3 mL via INTRAVENOUS

## 2015-02-10 MED ORDER — TECHNETIUM TC 99M SESTAMIBI GENERIC - CARDIOLITE
30.0000 | Freq: Once | INTRAVENOUS | Status: AC | PRN
  Administered 2015-02-10: 30 via INTRAVENOUS

## 2015-02-10 MED ORDER — SODIUM CHLORIDE 0.9 % IV SOLN: 250.0000 mL | INTRAVENOUS | Status: DC | PRN

## 2015-02-10 MED ORDER — SODIUM CHLORIDE 0.9 % WEIGHT BASED INFUSION
3.0000 mL/kg/h | INTRAVENOUS | Status: DC
  Administered 2015-02-11: 3 mL/kg/h via INTRAVENOUS

## 2015-02-10 MED ORDER — TECHNETIUM TC 99M SESTAMIBI GENERIC - CARDIOLITE
10.0000 | Freq: Once | INTRAVENOUS | Status: AC | PRN
  Administered 2015-02-10: 10 via INTRAVENOUS

## 2015-02-10 MED ORDER — PREDNISONE 20 MG PO TABS: ORAL_TABLET | ORAL | Status: DC

## 2015-02-10 NOTE — Discharge Summary (Signed)
Physician Discharge Summary  Nancy Acevedo [id number] DOB: 04-13-1960 DOA: 02/09/2015  PCP: Binnie Rail, MD  Admit date: 02/09/2015 Discharge date: 02/10/2015  Time spent: 30 minutes  Recommendations for Outpatient Follow-up:  1. 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  Fell hit head/chest No Sz No n/v  Admitted Cardiology consulted, Had Stress test which was neg  Notes has feelings of occ vertigo on standing quickly and needs a couple of moments to stabilize  No urinary symptoms nor dysuria  Hasn't happened b4  Noted was volume depketed and on HCTZ-rec to stop HCTZ and get bmet as OP  Discharge Exam: Filed Vitals:   02/10/15 1144 02/10/15 1148  BP: 139/90 134/86  Pulse: 88 90  Temp:  98.4 F (36.9 C)  Resp:      General: eomi ncat Cardiovascular: s1 s 2no m/r/g Respiratory: clear  Discharge Instructions   Discharge Instructions    Diet - low sodium heart healthy    Complete by:  As directed      Discharge instructions    Complete by:  As directed   your  chest pain was not cardiac Follow up with your primary MD     Increase activity slowly    Complete by:  As directed           Current Discharge Medication List    CONTINUE these medications which have NOT CHANGED   Details  cloNIDine (CATAPRES) 0.1 MG tablet Take 1 tablet (0.1 mg total) by mouth 3 (three) times daily. Qty: 180 tablet, Refills: 3    metoprolol succinate (TOPROL-XL) 100 MG 24 hr tablet Take 1 tablet (100 mg total) by mouth daily. Take with or immediately following a meal. Qty: 90 tablet, Refills: 1    Olmesartan-Amlodipine-HCTZ (TRIBENZOR) 40-10-25 MG TABS Take 1 tablet by mouth daily. Qty: 90 tablet, Refills: 3    Diclofenac Sodium 2 % SOLN Apply 1 pump twice daily. Qty: 112 g, Refills: 3    oxyCODONE-acetaminophen (PERCOCET) 5-325 MG tablet Take 1-2 tablets by mouth every 4 (four) hours as needed. Qty: 30 tablet, Refills: 0    pantoprazole (PROTONIX) 40 MG tablet Take 1 tablet (40 mg total) by mouth 2 (two) times daily. Qty: 60 tablet, Refills: 2      STOP taking these medications     predniSONE (DELTASONE) 20 MG tablet        Allergies  Allergen Reactions  . Lexapro [Escitalopram] Itching  . Spironolactone     rash  The results of significant diagnostics from this hospitalization (including imaging, microbiology, ancillary and laboratory) are listed below for reference.    Significant Diagnostic Studies: Dg Chest 2 View  02/09/2015  CLINICAL DATA:  Dizziness.  Syncope. EXAM: CHEST  2 VIEW COMPARISON:  10/17/2014 FINDINGS: Dextroconvex thoracic scoliosis, mild.  Mild thoracic spondylosis. The lungs appear clear. Cardiac and mediastinal contours normal. No pleural effusion identified. IMPRESSION: 1. No acute findings. 2. Mild thoracic spondylosis and mild dextroconvex thoracic scoliosis. Electronically Signed   By: Van Clines M.D.   On: 02/09/2015 11:38   Ct Head Wo Contrast  02/09/2015  CLINICAL DATA:  Syncope, frontal head pain. Found  prone on bathroom floor. EXAM: CT HEAD WITHOUT CONTRAST TECHNIQUE: Contiguous axial images were obtained from the base of the skull through the vertex without intravenous contrast. COMPARISON:  11/17/2009 and report from 02/27/2003 FINDINGS: The brainstem, cerebellum, cerebral peduncles, thalami, basal ganglia, basilar cisterns, and ventricular system appear within normal limits. No intracranial hemorrhage, mass lesion, or acute CVA. Chronic right maxillary sinusitis. Old right medial orbital wall fracture without extraocular muscle herniation. IMPRESSION: 1. No acute intracranial findings. 2. Old right medial orbital wall fracture. 3. Mild chronic right maxillary sinusitis. Electronically Signed   By: Van Clines M.D.   On: 02/09/2015 11:34   [place] Extrem Low Left Ltd  01/16/2015  [facility] US performed of: left This study was ordered, performed, and interpreted by Charlann Boxer D.O. Hip: Trochanteric bursa with significant hypoechoic changes and swelling Acetabular labrum visualized and without tears, displacement, or effusion in joint. Femoral neck appears unremarkable without increased power doppler signal along Cortex. IMPRESSION: Greater trochanter bursitis Procedure: Real-time Ultrasound Guided Injection of left greater trochanteric bursitis secondary to patient's body habitus Device: GE Logiq E  Ultrasound guided injection is preferred based studies that show increased duration, increased effect, greater accuracy, decreased procedural pain, increased response rate, and decreased cost with ultrasound guided versus blind injection.  Verbal informed consent obtained.  Time-out conducted.  Noted no overlying erythema, induration, or other signs of local infection.  Skin prepped in a sterile fashion.  Local anesthesia: Topical Ethyl chloride.  With sterile technique and under real time ultrasound guidance: Greater trochanteric area was visualized and patient's bursa was noted. A 22-gauge 3 inch needle was  inserted and 4 cc of 0.5% Marcaine and 1 cc of Kenalog 40 mg/dL was injected. Pictures taken Completed without difficulty  Pain immediately resolved suggesting accurate placement of the medication.  Advised to call if fevers/chills, erythema, induration, drainage, or persistent bleeding.  Images permanently stored and available for review in the ultrasound unit.  Impression: Technically successful ultrasound guided injection.    Microbiology: Recent Results (from the past 240 hour(s))  Urine culture     Status: None (Preliminary result)   Collection Time: 02/09/15 12:09 PM  Result Value Ref Range Status   Specimen Description URINE, CLEAN CATCH  Final   Special Requests NONE  Final   Culture >=100,000 COLONIES/mL ESCHERICHIA COLI  Final   Report Status PENDING  Incomplete     Labs: Basic Metabolic Panel:  Recent Labs Lab 02/09/15 1207 02/10/15 0354  NA 139 141  K 3.1* 3.9  CL 101 104  CO2 27 26  GLUCOSE 108* 128*  BUN 14 13  CREATININE 1.14* 1.16*  CALCIUM 8.5* 8.4*   Liver Function Tests:  Recent Labs Lab 02/10/15 0354  AST 29  ALT 32  ALKPHOS 39  BILITOT 0.5  PROT 5.7*  ALBUMIN 2.9*  No results for input(s): LIPASE, AMYLASE in the last 168 hours. No results for input(s): AMMONIA in the last 168 hours. CBC:  Recent Labs Lab 02/09/15 1207 02/10/15 0354  WBC 7.3 5.0  NEUTROABS  --  3.0  HGB 10.9* 9.4*  HCT 32.8* 29.2*  MCV 87.2 86.9  PLT 321 247   Cardiac Enzymes:  Recent Labs Lab 02/09/15 2103  TROPONINI <0.03   BNP: BNP (last 3 results) No results for input(s): BNP in the last 8760 hours.  ProBNP (last 3 results) No results for input(s): PROBNP in the last 8760 hours.  CBG:  Recent Labs Lab 02/09/15 1136 02/10/15 0638  GLUCAP 68 108*       Signed:  Nita Sells MD   [facility] 02/10/2015, 3:03 PM

## 2015-02-10 NOTE — Progress Notes (Signed)
Patient Name: Nancy Acevedo Date of Encounter: 02/10/2015  Primary Cardiologist: Dr. Meda Coffee   Principal Problem:   Syncope and collapse Active Problems:   Hypertension   Diabetes mellitus, type 2 (HCC)   Obese   OSA (obstructive sleep apnea)   Chest pain   Hypokalemia   Syncope   SUBJECTIVE  Feeling good overnight. Denies any CP or SOB. Denies any recurrent dizziness. Patient seen in nuc med  CURRENT MEDS . irbesartan  300 mg Oral Daily   And  . amLODipine  10 mg Oral Daily   And  . hydrochlorothiazide  25 mg Oral Daily  . cefTRIAXone (ROCEPHIN)  IV  1 g Intravenous Q24H  . cloNIDine  0.1 mg Oral TID  . enoxaparin (LOVENOX) injection  40 mg Subcutaneous Q24H  . metoprolol succinate  100 mg Oral Daily  . pantoprazole  40 mg Oral BID  . sodium chloride  3 mL Intravenous Q12H    OBJECTIVE  Filed Vitals:   02/09/15 1830 02/09/15 2116 02/10/15 0522 02/10/15 0858  BP: 111/71 125/75 110/69 130/82  Pulse: 87 94 72   Temp:  100.5 F (38.1 C) 98.2 F (36.8 C)   TempSrc:  Oral Oral   Resp: 18 18 18    Height:      Weight:   210 lb 14.4 oz (95.664 kg)   SpO2: 99% 97% 100%    No intake or output data in the 24 hours ending 02/10/15 0928 Filed Weights   02/09/15 1050 02/10/15 0522  Weight: 209 lb (94.802 kg) 210 lb 14.4 oz (95.664 kg)   PHYSICAL EXAM  General: Pleasant, NAD. Neuro: Alert and oriented X 3. Moves all extremities spontaneously. Psych: Normal affect. HEENT:  Normal  Neck: Supple without bruits or JVD. Lungs:  Resp regular and unlabored, CTA. Heart: RRR no s3, s4, or murmurs. Abdomen: Soft, non-tender, non-distended, BS + x 4.  Extremities: No clubbing, cyanosis or edema. DP/PT/Radials 2+ and equal bilaterally.  Accessory Clinical Findings  CBC  Recent Labs  02/09/15 1207 02/10/15 0354  WBC 7.3 5.0  NEUTROABS  --  3.0  HGB 10.9* 9.4*  HCT 32.8* 29.2*  MCV 87.2 86.9  PLT 321 [id number]   Basic Metabolic Panel  Recent Labs  02/09/15 1207  02/10/15 0354  NA 139 141  K 3.1* 3.9  CL 101 104  CO2 27 26  GLUCOSE 108* 128*  BUN 14 13  CREATININE 1.14* 1.16*  CALCIUM 8.5* 8.4*   Liver Function Tests  Recent Labs  02/10/15 0354  AST 29  ALT 32  ALKPHOS 39  BILITOT 0.5  PROT 5.7*  ALBUMIN 2.9*   No results for input(s): LIPASE, AMYLASE in the last 72 hours. Cardiac Enzymes  Recent Labs  02/09/15 2103  TROPONINI <0.03   ECG  No new EKG  Echocardiogram  pending   Radiology/Studies  Dg Chest 2 View  02/09/2015  CLINICAL DATA:  Dizziness.  Syncope. EXAM: CHEST  2 VIEW COMPARISON:  10/17/2014 FINDINGS: Dextroconvex thoracic scoliosis, mild.  Mild thoracic spondylosis. The lungs appear clear. Cardiac and mediastinal contours normal. No pleural effusion identified. IMPRESSION: 1. No acute findings. 2. Mild thoracic spondylosis and mild dextroconvex thoracic scoliosis. Electronically Signed   By: Van Clines M.D.   On: 02/09/2015 11:38   Ct Head Wo Contrast  02/09/2015  CLINICAL DATA:  Syncope, frontal head pain. Found prone on bathroom floor. EXAM: CT HEAD WITHOUT CONTRAST TECHNIQUE: Contiguous axial images were obtained from the base of the skull  through the vertex without intravenous contrast. COMPARISON:  11/17/2009 and report from 02/27/2003 FINDINGS: The brainstem, cerebellum, cerebral peduncles, thalami, basal ganglia, basilar cisterns, and ventricular system appear within normal limits. No intracranial hemorrhage, mass lesion, or acute CVA. Chronic right maxillary sinusitis. Old right medial orbital wall fracture without extraocular muscle herniation. IMPRESSION: 1. No acute intracranial findings. 2. Old right medial orbital wall fracture. 3. Mild chronic right maxillary sinusitis. Electronically Signed   By: Van Clines M.D.   On: 02/09/2015 11:34   [place] Extrem Low Left Ltd  01/16/2015  [facility] US performed of: left This study was ordered, performed, and interpreted by Charlann Boxer D.O. Hip: Trochanteric  bursa with significant hypoechoic changes and swelling Acetabular labrum visualized and without tears, displacement, or effusion in joint. Femoral neck appears unremarkable without increased power doppler signal along Cortex. IMPRESSION: Greater trochanter bursitis Procedure: Real-time Ultrasound Guided Injection of left greater trochanteric bursitis secondary to patient's body habitus Device: GE Logiq E  Ultrasound guided injection is preferred based studies that show increased duration, increased effect, greater accuracy, decreased procedural pain, increased response rate, and decreased cost with ultrasound guided versus blind injection.  Verbal informed consent obtained.  Time-out conducted.  Noted no overlying erythema, induration, or other signs of local infection.  Skin prepped in a sterile fashion.  Local anesthesia: Topical Ethyl chloride.  With sterile technique and under real time ultrasound guidance: Greater trochanteric area was visualized and patient's bursa was noted. A 22-gauge 3 inch needle was inserted and 4 cc of 0.5% Marcaine and 1 cc of Kenalog 40 mg/dL was injected. Pictures taken Completed without difficulty  Pain immediately resolved suggesting accurate placement of the medication.  Advised to call if fevers/chills, erythema, induration, drainage, or persistent bleeding.  Images permanently stored and available for review in the ultrasound unit.  Impression: Technically successful ultrasound guided injection.    ASSESSMENT AND PLAN  1. Recurrent syncope  - pending treadmill stress test and echo  2. Anemia: hgb 12.2 --> 10.6 --> 10.9 --> 9.4. Workup per primary team  3. UTI with positive nitrite: febirle with Tmax 100.5 last night, per IM  4. HTN 5. DM 6. OSA 7. GERD  Signed, Woodward Ku Pager: [phone]   The patient was seen, examined and discussed with Almyra Deforest, PA-C and I agree with the above.   55 year old female with recurrent syncope, the first one  in 2014, this time at rest she developed mild chest pain 2/10, felt like GERD, she tried to go to the bathroom when she passed out. She denies palpitations or SOB. Now asymptomatic. No FH of premature CAD. She has sedentary job, but is otherwise fairly active and has no CP on physical activity. She has h/o difficult to control hypertension.  His nuclear stress test shows hypertensive response to stress and small area moderate severity ischemia in the basal and mid inferolateral walls. We will schedule for a cardiac catheterization. Echocardiogram is pending. Her lipids in November were just borderline for LDL.   Dorothy Spark 02/10/2015

## 2015-02-11 ENCOUNTER — Encounter (HOSPITAL_COMMUNITY)
Admission: EM | Disposition: A | Discharge status: Home / Self Care | Payer: Self-pay | Source: Home / Self Care | Attending: Emergency Medicine

## 2015-02-11 ENCOUNTER — Encounter (HOSPITAL_COMMUNITY): Payer: Self-pay | Admitting: Cardiovascular Disease

## 2015-02-11 ENCOUNTER — Observation Stay (HOSPITAL_BASED_OUTPATIENT_CLINIC_OR_DEPARTMENT_OTHER): Payer: BC Managed Care – PPO

## 2015-02-11 DIAGNOSIS — R079 Chest pain, unspecified: Secondary | ICD-10-CM

## 2015-02-11 DIAGNOSIS — I209 Angina pectoris, unspecified: Secondary | ICD-10-CM | POA: Diagnosis not present

## 2015-02-11 DIAGNOSIS — R931 Abnormal findings on diagnostic imaging of heart and coronary circulation: Secondary | ICD-10-CM | POA: Diagnosis not present

## 2015-02-11 DIAGNOSIS — I10 Essential (primary) hypertension: Secondary | ICD-10-CM

## 2015-02-11 DIAGNOSIS — R55 Syncope and collapse: Secondary | ICD-10-CM | POA: Insufficient documentation

## 2015-02-11 DIAGNOSIS — E119 Type 2 diabetes mellitus without complications: Secondary | ICD-10-CM | POA: Diagnosis not present

## 2015-02-11 DIAGNOSIS — R9439 Abnormal result of other cardiovascular function study: Secondary | ICD-10-CM | POA: Insufficient documentation

## 2015-02-11 DIAGNOSIS — N390 Urinary tract infection, site not specified: Secondary | ICD-10-CM

## 2015-02-11 HISTORY — PX: CARDIAC CATHETERIZATION: SHX172

## 2015-02-11 LAB — IRON AND TIBC
Iron: 49 ug/dL (ref 28–170)
SATURATION RATIOS: 19 % (ref 10.4–31.8)
TIBC: 258 ug/dL (ref 250–450)
UIBC: 209 ug/dL

## 2015-02-11 LAB — CBC
HEMATOCRIT: 29.9 % — AB (ref 36.0–46.0)
HEMOGLOBIN: 9.9 g/dL — AB (ref 12.0–15.0)
MCH: 28.9 pg (ref 26.0–34.0)
MCHC: 33.1 g/dL (ref 30.0–36.0)
MCV: 87.4 fL (ref 78.0–100.0)
Platelets: 259 10*3/uL (ref 150–400)
RBC: 3.42 MIL/uL — ABNORMAL LOW (ref 3.87–5.11)
RDW: 15.5 % (ref 11.5–15.5)
WBC: 4.8 10*3/uL (ref 4.0–10.5)

## 2015-02-11 LAB — VITAMIN B12: VITAMIN B 12: 99 pg/mL — AB (ref 180–914)

## 2015-02-11 LAB — FOLATE: FOLATE: 8.7 ng/mL (ref >5.9)

## 2015-02-11 LAB — PROTIME-INR
INR: 1.1 (ref 0.00–1.49)
Prothrombin Time: 14.4 seconds (ref 11.6–15.2)

## 2015-02-11 LAB — URINE CULTURE

## 2015-02-11 LAB — RETICULOCYTES
RBC.: 3.42 MIL/uL — ABNORMAL LOW (ref 3.87–5.11)
Retic Count, Absolute: 44.5 10*3/uL (ref 19.0–186.0)
Retic Ct Pct: 1.3 % (ref 0.4–3.1)

## 2015-02-11 LAB — GLUCOSE, CAPILLARY: Glucose-Capillary: 87 mg/dL (ref 65–99)

## 2015-02-11 LAB — FERRITIN: Ferritin: 589 ng/mL — ABNORMAL HIGH (ref 11–307)

## 2015-02-11 SURGERY — LEFT HEART CATH AND CORONARY ANGIOGRAPHY

## 2015-02-11 MED ORDER — HEPARIN SODIUM (PORCINE) 1000 UNIT/ML IJ SOLN
INTRAMUSCULAR | Status: AC
  Filled 2015-02-11: qty 1

## 2015-02-11 MED ORDER — MIDAZOLAM HCL 2 MG/2ML IJ SOLN
INTRAMUSCULAR | Status: AC
  Filled 2015-02-11: qty 2

## 2015-02-11 MED ORDER — SODIUM CHLORIDE 0.9% FLUSH: 3.0000 mL | INTRAVENOUS | Status: DC | PRN

## 2015-02-11 MED ORDER — ACETAMINOPHEN 325 MG PO TABS: 650.0000 mg | ORAL_TABLET | ORAL | Status: DC | PRN

## 2015-02-11 MED ORDER — VERAPAMIL HCL 2.5 MG/ML IV SOLN
INTRA_ARTERIAL | Status: DC | PRN
  Administered 2015-02-11: 10:00:00 via INTRA_ARTERIAL

## 2015-02-11 MED ORDER — HEPARIN SODIUM (PORCINE) 1000 UNIT/ML IJ SOLN
INTRAMUSCULAR | Status: DC | PRN
  Administered 2015-02-11: 5000 [IU] via INTRAVENOUS

## 2015-02-11 MED ORDER — LIDOCAINE HCL (PF) 1 % IJ SOLN
INTRAMUSCULAR | Status: AC
  Filled 2015-02-11: qty 30

## 2015-02-11 MED ORDER — HEPARIN (PORCINE) IN NACL 2-0.9 UNIT/ML-% IJ SOLN
INTRAMUSCULAR | Status: AC
  Filled 2015-02-11: qty 1000

## 2015-02-11 MED ORDER — MIDAZOLAM HCL 2 MG/2ML IJ SOLN
INTRAMUSCULAR | Status: DC | PRN
  Administered 2015-02-11: 2 mg via INTRAVENOUS

## 2015-02-11 MED ORDER — FENTANYL CITRATE (PF) 100 MCG/2ML IJ SOLN
INTRAMUSCULAR | Status: AC
  Filled 2015-02-11: qty 2

## 2015-02-11 MED ORDER — ONDANSETRON HCL 4 MG/2ML IJ SOLN: 4.0000 mg | Freq: Four times a day (QID) | INTRAMUSCULAR | Status: DC | PRN

## 2015-02-11 MED ORDER — SODIUM CHLORIDE 0.9 % WEIGHT BASED INFUSION
3.0000 mL/kg/h | INTRAVENOUS | Status: AC
  Administered 2015-02-11: 3 mL/kg/h via INTRAVENOUS

## 2015-02-11 MED ORDER — NITROGLYCERIN 1 MG/10 ML FOR IR/CATH LAB
INTRA_ARTERIAL | Status: AC
  Filled 2015-02-11: qty 10

## 2015-02-11 MED ORDER — NITROGLYCERIN 1 MG/10 ML FOR IR/CATH LAB
INTRA_ARTERIAL | Status: DC | PRN
  Administered 2015-02-11: 11:00:00

## 2015-02-11 MED ORDER — ENOXAPARIN SODIUM 40 MG/0.4ML ~~LOC~~ SOLN
40.0000 mg | SUBCUTANEOUS | Status: DC
  Filled 2015-02-11: qty 0.4

## 2015-02-11 MED ORDER — SODIUM CHLORIDE 0.9 % IV SOLN: 250.0000 mL | INTRAVENOUS | Status: DC | PRN

## 2015-02-11 MED ORDER — SODIUM CHLORIDE 0.9% FLUSH
3.0000 mL | Freq: Two times a day (BID) | INTRAVENOUS | Status: DC
  Administered 2015-02-11: 3 mL via INTRAVENOUS

## 2015-02-11 MED ORDER — VERAPAMIL HCL 2.5 MG/ML IV SOLN
INTRAVENOUS | Status: AC
  Filled 2015-02-11: qty 2

## 2015-02-11 MED ORDER — FENTANYL CITRATE (PF) 100 MCG/2ML IJ SOLN
INTRAMUSCULAR | Status: DC | PRN
  Administered 2015-02-11: 50 ug via INTRAVENOUS

## 2015-02-11 MED ORDER — IOHEXOL 350 MG/ML SOLN
INTRAVENOUS | Status: DC | PRN
  Administered 2015-02-11: 80 mL via INTRA_ARTERIAL

## 2015-02-11 SURGICAL SUPPLY — 11 items
CATH INFINITI 5FR ANG PIGTAIL (CATHETERS) ×3 IMPLANT
CATH INFINITI JR4 5F (CATHETERS) ×3 IMPLANT
CATH OPTITORQUE JACKY 4.0 5F (CATHETERS) ×3 IMPLANT
DEVICE RAD COMP TR BAND LRG (VASCULAR PRODUCTS) ×3 IMPLANT
GLIDESHEATH SLEND SS 6F .021 (SHEATH) ×3 IMPLANT
KIT HEART LEFT (KITS) ×3 IMPLANT
PACK CARDIAC CATHETERIZATION (CUSTOM PROCEDURE TRAY) ×3 IMPLANT
SYR MEDRAD MARK V 150ML (SYRINGE) ×3 IMPLANT
TRANSDUCER W/STOPCOCK (MISCELLANEOUS) ×3 IMPLANT
TUBING CIL FLEX 10 FLL-RA (TUBING) ×3 IMPLANT
WIRE SAFE-T 1.5MM-J .035X260CM (WIRE) ×3 IMPLANT

## 2015-02-11 NOTE — H&P (View-Only) (Signed)
Patient Name: Nancy Acevedo Date of Encounter: 02/10/2015  Primary Cardiologist: Dr. Meda Coffee   Principal Problem:   Syncope and collapse Active Problems:   Hypertension   Diabetes mellitus, type 2 (HCC)   Obese   OSA (obstructive sleep apnea)   Chest pain   Hypokalemia   Syncope   SUBJECTIVE  Feeling good overnight. Denies any CP or SOB. Denies any recurrent dizziness. Patient seen in nuc med  CURRENT MEDS . irbesartan  300 mg Oral Daily   And  . amLODipine  10 mg Oral Daily   And  . hydrochlorothiazide  25 mg Oral Daily  . cefTRIAXone (ROCEPHIN)  IV  1 g Intravenous Q24H  . cloNIDine  0.1 mg Oral TID  . enoxaparin (LOVENOX) injection  40 mg Subcutaneous Q24H  . metoprolol succinate  100 mg Oral Daily  . pantoprazole  40 mg Oral BID  . sodium chloride  3 mL Intravenous Q12H    OBJECTIVE  Filed Vitals:   02/09/15 1830 02/09/15 2116 02/10/15 0522 02/10/15 0858  BP: 111/71 125/75 110/69 130/82  Pulse: 87 94 72   Temp:  100.5 F (38.1 C) 98.2 F (36.8 C)   TempSrc:  Oral Oral   Resp: 18 18 18    Height:      Weight:   210 lb 14.4 oz (95.664 kg)   SpO2: 99% 97% 100%    No intake or output data in the 24 hours ending 02/10/15 0928 Filed Weights   02/09/15 1050 02/10/15 0522  Weight: 209 lb (94.802 kg) 210 lb 14.4 oz (95.664 kg)   PHYSICAL EXAM  General: Pleasant, NAD. Neuro: Alert and oriented X 3. Moves all extremities spontaneously. Psych: Normal affect. HEENT:  Normal  Neck: Supple without bruits or JVD. Lungs:  Resp regular and unlabored, CTA. Heart: RRR no s3, s4, or murmurs. Abdomen: Soft, non-tender, non-distended, BS + x 4.  Extremities: No clubbing, cyanosis or edema. DP/PT/Radials 2+ and equal bilaterally.  Accessory Clinical Findings  CBC  Recent Labs  02/09/15 1207 02/10/15 0354  WBC 7.3 5.0  NEUTROABS  --  3.0  HGB 10.9* 9.4*  HCT 32.8* 29.2*  MCV 87.2 86.9  PLT 321 [id number]   Basic Metabolic Panel  Recent Labs  02/09/15 1207  02/10/15 0354  NA 139 141  K 3.1* 3.9  CL 101 104  CO2 27 26  GLUCOSE 108* 128*  BUN 14 13  CREATININE 1.14* 1.16*  CALCIUM 8.5* 8.4*   Liver Function Tests  Recent Labs  02/10/15 0354  AST 29  ALT 32  ALKPHOS 39  BILITOT 0.5  PROT 5.7*  ALBUMIN 2.9*   No results for input(s): LIPASE, AMYLASE in the last 72 hours. Cardiac Enzymes  Recent Labs  02/09/15 2103  TROPONINI <0.03   ECG  No new EKG  Echocardiogram  pending   Radiology/Studies  Dg Chest 2 View  02/09/2015  CLINICAL DATA:  Dizziness.  Syncope. EXAM: CHEST  2 VIEW COMPARISON:  10/17/2014 FINDINGS: Dextroconvex thoracic scoliosis, mild.  Mild thoracic spondylosis. The lungs appear clear. Cardiac and mediastinal contours normal. No pleural effusion identified. IMPRESSION: 1. No acute findings. 2. Mild thoracic spondylosis and mild dextroconvex thoracic scoliosis. Electronically Signed   By: Van Clines M.D.   On: 02/09/2015 11:38   Ct Head Wo Contrast  02/09/2015  CLINICAL DATA:  Syncope, frontal head pain. Found prone on bathroom floor. EXAM: CT HEAD WITHOUT CONTRAST TECHNIQUE: Contiguous axial images were obtained from the base of the skull  through the vertex without intravenous contrast. COMPARISON:  11/17/2009 and report from 02/27/2003 FINDINGS: The brainstem, cerebellum, cerebral peduncles, thalami, basal ganglia, basilar cisterns, and ventricular system appear within normal limits. No intracranial hemorrhage, mass lesion, or acute CVA. Chronic right maxillary sinusitis. Old right medial orbital wall fracture without extraocular muscle herniation. IMPRESSION: 1. No acute intracranial findings. 2. Old right medial orbital wall fracture. 3. Mild chronic right maxillary sinusitis. Electronically Signed   By: Van Clines M.D.   On: 02/09/2015 11:34   [place] Extrem Low Left Ltd  01/16/2015  [facility] US performed of: left This study was ordered, performed, and interpreted by Charlann Boxer D.O. Hip: Trochanteric  bursa with significant hypoechoic changes and swelling Acetabular labrum visualized and without tears, displacement, or effusion in joint. Femoral neck appears unremarkable without increased power doppler signal along Cortex. IMPRESSION: Greater trochanter bursitis Procedure: Real-time Ultrasound Guided Injection of left greater trochanteric bursitis secondary to patient's body habitus Device: GE Logiq E  Ultrasound guided injection is preferred based studies that show increased duration, increased effect, greater accuracy, decreased procedural pain, increased response rate, and decreased cost with ultrasound guided versus blind injection.  Verbal informed consent obtained.  Time-out conducted.  Noted no overlying erythema, induration, or other signs of local infection.  Skin prepped in a sterile fashion.  Local anesthesia: Topical Ethyl chloride.  With sterile technique and under real time ultrasound guidance: Greater trochanteric area was visualized and patient's bursa was noted. A 22-gauge 3 inch needle was inserted and 4 cc of 0.5% Marcaine and 1 cc of Kenalog 40 mg/dL was injected. Pictures taken Completed without difficulty  Pain immediately resolved suggesting accurate placement of the medication.  Advised to call if fevers/chills, erythema, induration, drainage, or persistent bleeding.  Images permanently stored and available for review in the ultrasound unit.  Impression: Technically successful ultrasound guided injection.    ASSESSMENT AND PLAN  1. Recurrent syncope  - pending treadmill stress test and echo  2. Anemia: hgb 12.2 --> 10.6 --> 10.9 --> 9.4. Workup per primary team  3. UTI with positive nitrite: febirle with Tmax 100.5 last night, per IM  4. HTN 5. DM 6. OSA 7. GERD  Signed, Woodward Ku Pager: [phone]   The patient was seen, examined and discussed with Almyra Deforest, PA-C and I agree with the above.   55 year old female with recurrent syncope, the first one  in 2014, this time at rest she developed mild chest pain 2/10, felt like GERD, she tried to go to the bathroom when she passed out. She denies palpitations or SOB. Now asymptomatic. No FH of premature CAD. She has sedentary job, but is otherwise fairly active and has no CP on physical activity. She has h/o difficult to control hypertension.  His nuclear stress test shows hypertensive response to stress and small area moderate severity ischemia in the basal and mid inferolateral walls. We will schedule for a cardiac catheterization. Echocardiogram is pending. Her lipids in November were just borderline for LDL.   Dorothy Spark 02/10/2015

## 2015-02-11 NOTE — Research (Signed)
CADLAD Informed Consent   Subject Name: Nancy Acevedo  Subject met inclusion and exclusion criteria.  The informed consent form, study requirements and expectations were reviewed with the subject and questions and concerns were addressed prior to the signing of the consent form.  The subject verbalized understanding of the trail requirements.  The subject agreed to participate in the CADLAD trial and signed the informed consent.  The informed consent was obtained prior to performance of any protocol-specific procedures for the subject.  A copy of the signed informed consent was given to the subject and a copy was placed in the subject's medical record.  [name] 02/11/2015, 0730 am

## 2015-02-11 NOTE — Interval H&P Note (Signed)
Cath Lab Visit (complete for each Cath Lab visit)  Clinical Evaluation Leading to the Procedure:   ACS: No.  Non-ACS:    Anginal Classification: CCS II  Anti-ischemic medical therapy: Maximal Therapy (2 or more classes of medications)  Non-Invasive Test Results: Intermediate-risk stress test findings: cardiac mortality 1-3%/year  Prior CABG: No previous CABG      History and Physical Interval Note:  02/11/2015 10:10 AM  Nancy Acevedo  has presented today for surgery, with the diagnosis of abnormal nuc  The various methods of treatment have been discussed with the patient and family. After consideration of risks, benefits and other options for treatment, the patient has consented to  Procedure(s): Left Heart Cath and Coronary Angiography (N/A) as a surgical intervention .  The patient's history has been reviewed, patient examined, no change in status, stable for surgery.  I have reviewed the patient's chart and labs.  Questions were answered to the patient's satisfaction.     KELLY,THOMAS A

## 2015-02-11 NOTE — Progress Notes (Signed)
  Echocardiogram 2D Echocardiogram has been performed.  Jennette Dubin 02/11/2015, 9:53 AM

## 2015-02-11 NOTE — Progress Notes (Signed)
[facility]                                                                              Patient Demographics  Nancy Acevedo, is a 55 y.o. female, DOB - 03/04/1960, ZP:[id number]  Admit date - 02/09/2015   Admitting Physician Gennaro Africa, MD  Outpatient Primary MD for the patient is Binnie Rail, MD  LOS -    Chief Complaint  Patient presents with  . Loss of Consciousness      HPI on 02/09/2015 by Dr. Gennaro Africa Patient is a 55 yo female with history of HTN who came here with cc of syncope.She felt mild chest discomfort earlier today at work that she related to indigestion that was followed by feeling dizzy while she was walking to the bathroom. She found herself on the floor in the bathroom 5-10 min later with no tongue biting/incontinence and asked her friend to call EMS. She denied dyspnea. No cough/fever/chills/N/V/D/C/dysuria. No other complaints. She said she has had occasional and sporadic episodes of sweating at night before and had a prior syncopal episode in the past.   Assessment & Plan   Syncope -Unknown etiology -Echocardiogram: EF 60-65%, mild LVH -Stress test was intermediate -Patient had cardiac catheterization showed normal coronaries and normal LVEF -Orthostatic vitals negative  Escherichia coli Urinary tract infection -Urine culture positive for Escherichia coli, pansensitive -Currently on ceftriaxone, will discharge with Ceftin   Hypertension -Continue amlodipine, metoprolol, Avapro, on a Dean   Normocytic Anemia -Will obtain anemia panel and FOBT -Hemoglobin currently 9.9  Code Status: Full  Family Communication: None at bedside  Disposition Plan: Admitted, continue to monitor anemia  Time Spent in minutes   30 minutes  Procedures  Heart catheterization Stress test  Consults   cardiology  DVT Prophylaxis  lovenox  Lab Results  Component Value Date   PLT 259 02/11/2015    Medications  Scheduled Meds: . irbesartan  300  mg Oral Daily   And  . amLODipine  10 mg Oral Daily  . cefTRIAXone (ROCEPHIN)  IV  1 g Intravenous Q24H  . cloNIDine  0.1 mg Oral TID  . [START ON 02/12/2015] enoxaparin (LOVENOX) injection  40 mg Subcutaneous Q24H  . metoprolol succinate  100 mg Oral Daily  . pantoprazole  40 mg Oral BID  . sodium chloride  3 mL Intravenous Q12H  . sodium chloride flush  3 mL Intravenous Q12H   Continuous Infusions: . sodium chloride 3 mL/kg/hr (02/11/15 1145)  . dextrose 5 % and 0.45% NaCl Stopped (02/11/15 0622)   PRN Meds:.sodium chloride, acetaminophen, ondansetron (ZOFRAN) IV, sodium chloride flush  Antibiotics    Anti-infectives    Start     Dose/Rate Route Frequency Ordered Stop   02/09/15 1615  cefTRIAXone (ROCEPHIN) 1 g in dextrose 5 % 50 mL IVPB     1 g 100 mL/hr over 30 Minutes Intravenous Every 24 hours 02/09/15 1608          Subjective:   Nancy Acevedo seen and examined today.  Patient denies chest pain. She was very anxious regarding her stress test. Patient denies any shortness of breath or abdominal pain. Denies any dizziness. She does  have some lightheadedness upon standing quickly.  Objective:   Filed Vitals:   02/11/15 1215 02/11/15 1230 02/11/15 1300 02/11/15 1315  BP: 100/66 99/70 102/70 103/71  Pulse: 63 57  59  Temp: 98.2 F (36.8 C)     TempSrc:      Resp: 18  16   Height:      Weight:      SpO2:        Wt Readings from Last 3 Encounters:  02/11/15 97.75 kg (215 lb 8 oz)  01/27/15 94.348 kg (208 lb)  01/15/15 94.802 kg (209 lb)     Intake/Output Summary (Last 24 hours) at 02/11/15 1344 Last data filed at 02/11/15 [id number]  Gross per 24 hour  Intake   1426 ml  Output      0 ml  Net   1426 ml    Exam  General: Well developed, well nourished, NAD, appears stated age  76: NCAT, mucous membranes moist.   Cardiovascular: S1 S2 auscultated,no murmurs, RRR   Respiratory: Clear to auscultation bilaterally with equal chest rise  Abdomen: Soft,   obese, nontender, nondistended, + bowel sounds  Extremities: warm dry without cyanosis clubbing or edema  Neuro: AAOx3,nonfocal   Psych:Slightly anxious   Data Review   Micro Results Recent Results (from the past 240 hour(s))  Urine culture     Status: None   Collection Time: 02/09/15 12:09 PM  Result Value Ref Range Status   Specimen Description URINE, CLEAN CATCH  Final   Special Requests NONE  Final   Culture >=100,000 COLONIES/mL ESCHERICHIA COLI  Final   Report Status 02/11/2015 FINAL  Final   Organism ID, Bacteria ESCHERICHIA COLI  Final      Susceptibility   Escherichia coli - MIC*    AMPICILLIN <=2 SENSITIVE Sensitive     CEFAZOLIN <=4 SENSITIVE Sensitive     CEFTRIAXONE <=1 SENSITIVE Sensitive     CIPROFLOXACIN <=0.25 SENSITIVE Sensitive     GENTAMICIN <=1 SENSITIVE Sensitive     IMIPENEM <=0.25 SENSITIVE Sensitive     NITROFURANTOIN <=16 SENSITIVE Sensitive     TRIMETH/SULFA <=20 SENSITIVE Sensitive     AMPICILLIN/SULBACTAM <=2 SENSITIVE Sensitive     PIP/TAZO <=4 SENSITIVE Sensitive     * >=100,000 COLONIES/mL ESCHERICHIA COLI    Radiology Reports Dg Chest 2 View  02/09/2015  CLINICAL DATA:  Dizziness.  Syncope. EXAM: CHEST  2 VIEW COMPARISON:  10/17/2014 FINDINGS: Dextroconvex thoracic scoliosis, mild.  Mild thoracic spondylosis. The lungs appear clear. Cardiac and mediastinal contours normal. No pleural effusion identified. IMPRESSION: 1. No acute findings. 2. Mild thoracic spondylosis and mild dextroconvex thoracic scoliosis. Electronically Signed   By: Van Clines M.D.   On: 02/09/2015 11:38   Ct Head Wo Contrast  02/09/2015  CLINICAL DATA:  Syncope, frontal head pain. Found prone on bathroom floor. EXAM: CT HEAD WITHOUT CONTRAST TECHNIQUE: Contiguous axial images were obtained from the base of the skull through the vertex without intravenous contrast. COMPARISON:  11/17/2009 and report from 02/27/2003 FINDINGS: The brainstem, cerebellum, cerebral  peduncles, thalami, basal ganglia, basilar cisterns, and ventricular system appear within normal limits. No intracranial hemorrhage, mass lesion, or acute CVA. Chronic right maxillary sinusitis. Old right medial orbital wall fracture without extraocular muscle herniation. IMPRESSION: 1. No acute intracranial findings. 2. Old right medial orbital wall fracture. 3. Mild chronic right maxillary sinusitis. Electronically Signed   By: Van Clines M.D.   On: 02/09/2015 11:34   Nm Myocar Multi W/spect W/wall Motion /  Ef  02/10/2015   Blood pressure demonstrated a hypertensive response to exercise.  There was no ST segment deviation noted during stress.  Defect 1: There is a medium defect of moderate severity present in the basal inferior, basal inferolateral, mid inferior and mid inferolateral location.  Findings consistent with ischemia.  This is an intermediate risk study.  The left ventricular ejection fraction is normal (55-65%).  There was mild hypokinesis of the basal to mid inferior walls.    02/10/2015   Blood pressure demonstrated a hypertensive response to exercise.  There was no ST segment deviation noted during stress.  Defect 1: There is a medium defect of moderate severity present in the basal inferior, basal inferolateral, mid inferior and mid inferolateral location.  Findings consistent with ischemia.  This is an intermediate risk study.    [place] Extrem Low Left Ltd  01/16/2015  MSK US performed of: left This study was ordered, performed, and interpreted by Charlann Boxer D.O. Hip: Trochanteric bursa with significant hypoechoic changes and swelling Acetabular labrum visualized and without tears, displacement, or effusion in joint. Femoral neck appears unremarkable without increased power doppler signal along Cortex. IMPRESSION: Greater trochanter bursitis Procedure: Real-time Ultrasound Guided Injection of left greater trochanteric bursitis secondary to patient's body habitus Device: GE  Logiq E  Ultrasound guided injection is preferred based studies that show increased duration, increased effect, greater accuracy, decreased procedural pain, increased response rate, and decreased cost with ultrasound guided versus blind injection.  Verbal informed consent obtained.  Time-out conducted.  Noted no overlying erythema, induration, or other signs of local infection.  Skin prepped in a sterile fashion.  Local anesthesia: Topical Ethyl chloride.  With sterile technique and under real time ultrasound guidance: Greater trochanteric area was visualized and patient's bursa was noted. A 22-gauge 3 inch needle was inserted and 4 cc of 0.5% Marcaine and 1 cc of Kenalog 40 mg/dL was injected. Pictures taken Completed without difficulty  Pain immediately resolved suggesting accurate placement of the medication.  Advised to call if fevers/chills, erythema, induration, drainage, or persistent bleeding.  Images permanently stored and available for review in the ultrasound unit.  Impression: Technically successful ultrasound guided injection.    CBC  Recent Labs Lab 02/09/15 1207 02/10/15 0354 02/11/15 0904  WBC 7.3 5.0 4.8  HGB 10.9* 9.4* 9.9*  HCT 32.8* 29.2* 29.9*  PLT 321 247 259  MCV 87.2 86.9 87.4  MCH 29.0 28.0 28.9  MCHC 33.2 32.2 33.1  RDW 15.4 15.4 15.5  LYMPHSABS  --  1.4  --   MONOABS  --  0.5  --   EOSABS  --  0.1  --   BASOSABS  --  0.0  --     Chemistries   Recent Labs Lab 02/09/15 1207 02/10/15 0354  NA 139 141  K 3.1* 3.9  CL 101 104  CO2 27 26  GLUCOSE 108* 128*  BUN 14 13  CREATININE 1.14* 1.16*  CALCIUM 8.5* 8.4*  AST  --  29  ALT  --  32  ALKPHOS  --  39  BILITOT  --  0.5   ------------------------------------------------------------------------------------------------------------------ estimated creatinine clearance is 63.7 mL/min (by C-G formula based on Cr of  1.16). ------------------------------------------------------------------------------------------------------------------ No results for input(s): HGBA1C in the last 72 hours. ------------------------------------------------------------------------------------------------------------------ No results for input(s): CHOL, HDL, LDLCALC, TRIG, CHOLHDL, LDLDIRECT in the last 72 hours. ------------------------------------------------------------------------------------------------------------------ No results for input(s): TSH, T4TOTAL, T3FREE, THYROIDAB in the last 72 hours.  Invalid input(s): FREET3 ------------------------------------------------------------------------------------------------------------------ No results for  input(s): VITAMINB12, FOLATE, FERRITIN, TIBC, IRON, RETICCTPCT in the last 72 hours.  Coagulation profile  Recent Labs Lab 02/11/15 0904  INR 1.10    No results for input(s): DDIMER in the last 72 hours.  Cardiac Enzymes  Recent Labs Lab 02/09/15 2103  TROPONINI <0.03   ------------------------------------------------------------------------------------------------------------------ Invalid input(s): POCBNP    Van Seymore D.O. on 02/11/2015 at 1:44 PM  Between 7am to 7pm - Pager - [phone]  After 7pm go to [email or web address] - password [email or web address]  And look for the night coverage person covering for me after hours  [facility] Office  [phone]

## 2015-02-11 NOTE — Progress Notes (Signed)
Patient Name: Nancy Acevedo Date of Encounter: 02/11/2015  Primary Cardiologist: Dr. Meda Coffee   Principal Problem:   Syncope and collapse Active Problems:   Hypertension   Diabetes mellitus, type 2 (HCC)   Obese   OSA (obstructive sleep apnea)   Chest pain   Hypokalemia   Syncope   Pain in the chest   Abnormal nuclear stress test   SUBJECTIVE  Feeling good overnight. Denies any CP or SOB.   CURRENT MEDS . irbesartan  300 mg Oral Daily   And  . amLODipine  10 mg Oral Daily  . cefTRIAXone (ROCEPHIN)  IV  1 g Intravenous Q24H  . cloNIDine  0.1 mg Oral TID  . [START ON 02/12/2015] enoxaparin (LOVENOX) injection  40 mg Subcutaneous Q24H  . metoprolol succinate  100 mg Oral Daily  . pantoprazole  40 mg Oral BID  . sodium chloride  3 mL Intravenous Q12H  . sodium chloride flush  3 mL Intravenous Q12H    OBJECTIVE  Filed Vitals:   02/11/15 1038 02/11/15 1043 02/11/15 1048 02/11/15 1114  BP: 97/59 94/59 100/58 97/65  Pulse: 67 63 59 69  Temp:      TempSrc:      Resp: 17 16 15 17   Height:      Weight:      SpO2: 100% 100% 100%     Intake/Output Summary (Last 24 hours) at 02/11/15 1148 Last data filed at 02/11/15 0925  Gross per 24 hour  Intake   1826 ml  Output      0 ml  Net   1826 ml   Filed Weights   02/10/15 0522 02/10/15 1700 02/11/15 0456  Weight: 210 lb 14.4 oz (95.664 kg) 212 lb 6.4 oz (96.344 kg) 215 lb 8 oz (97.75 kg)   PHYSICAL EXAM  General: Pleasant, NAD. Neuro: Alert and oriented X 3. Moves all extremities spontaneously. Psych: Normal affect. HEENT:  Normal  Neck: Supple without bruits or JVD. Lungs:  Resp regular and unlabored, CTA. Heart: RRR no s3, s4, or murmurs. Abdomen: Soft, non-tender, non-distended, BS + x 4.  Extremities: No clubbing, cyanosis or edema. DP/PT/Radials 2+ and equal bilaterally.  Accessory Clinical Findings  CBC  Recent Labs  02/10/15 0354 02/11/15 0904  WBC 5.0 4.8  NEUTROABS 3.0  --   HGB 9.4* 9.9*    HCT 29.2* 29.9*  MCV 86.9 87.4  PLT 247 [id number]   Basic Metabolic Panel  Recent Labs  02/09/15 1207 02/10/15 0354  NA 139 141  K 3.1* 3.9  CL 101 104  CO2 27 26  GLUCOSE 108* 128*  BUN 14 13  CREATININE 1.14* 1.16*  CALCIUM 8.5* 8.4*   Liver Function Tests  Recent Labs  02/10/15 0354  AST 29  ALT 32  ALKPHOS 39  BILITOT 0.5  PROT 5.7*  ALBUMIN 2.9*   No results for input(s): LIPASE, AMYLASE in the last 72 hours. Cardiac Enzymes  Recent Labs  02/09/15 2103  TROPONINI <0.03   ECG  No new EKG  Echocardiogram  pending   Radiology/Studies  Dg Chest 2 View  02/09/2015  CLINICAL DATA:  Dizziness.  Syncope. EXAM: CHEST  2 VIEW COMPARISON:  10/17/2014 FINDINGS: Dextroconvex thoracic scoliosis, mild.  Mild thoracic spondylosis. The lungs appear clear. Cardiac and mediastinal contours normal. No pleural effusion identified. IMPRESSION: 1. No acute findings. 2. Mild thoracic spondylosis and mild dextroconvex thoracic scoliosis. Electronically Signed   By: Van Clines M.D.   On: 02/09/2015 11:38  Ct Head Wo Contrast  02/09/2015  CLINICAL DATA:  Syncope, frontal head pain. Found prone on bathroom floor. EXAM: CT HEAD WITHOUT CONTRAST TECHNIQUE: Contiguous axial images were obtained from the base of the skull through the vertex without intravenous contrast. COMPARISON:  11/17/2009 and report from 02/27/2003 FINDINGS: The brainstem, cerebellum, cerebral peduncles, thalami, basal ganglia, basilar cisterns, and ventricular system appear within normal limits. No intracranial hemorrhage, mass lesion, or acute CVA. Chronic right maxillary sinusitis. Old right medial orbital wall fracture without extraocular muscle herniation. IMPRESSION: 1. No acute intracranial findings. 2. Old right medial orbital wall fracture. 3. Mild chronic right maxillary sinusitis. Electronically Signed   By: Van Clines M.D.   On: 02/09/2015 11:34   Nm Myocar Multi W/spect W/wall Motion /  Ef  02/10/2015   Blood pressure demonstrated a hypertensive response to exercise.  There was no ST segment deviation noted during stress.  Defect 1: There is a medium defect of moderate severity present in the basal inferior, basal inferolateral, mid inferior and mid inferolateral location.  Findings consistent with ischemia.  This is an intermediate risk study.  The left ventricular ejection fraction is normal (55-65%).  There was mild hypokinesis of the basal to mid inferior walls.    02/10/2015   Blood pressure demonstrated a hypertensive response to exercise.  There was no ST segment deviation noted during stress.  Defect 1: There is a medium defect of moderate severity present in the basal inferior, basal inferolateral, mid inferior and mid inferolateral location.  Findings consistent with ischemia.  This is an intermediate risk study.    [place] Extrem Low Left Ltd  01/16/2015  MSK US performed of: left This study was ordered, performed, and interpreted by Charlann Boxer D.O. Hip: Trochanteric bursa with significant hypoechoic changes and swelling Acetabular labrum visualized and without tears, displacement, or effusion in joint. Femoral neck appears unremarkable without increased power doppler signal along Cortex. IMPRESSION: Greater trochanter bursitis Procedure: Real-time Ultrasound Guided Injection of left greater trochanteric bursitis secondary to patient's body habitus Device: GE Logiq E  Ultrasound guided injection is preferred based studies that show increased duration, increased effect, greater accuracy, decreased procedural pain, increased response rate, and decreased cost with ultrasound guided versus blind injection.  Verbal informed consent obtained.  Time-out conducted.  Noted no overlying erythema, induration, or other signs of local infection.  Skin prepped in a sterile fashion.  Local anesthesia: Topical Ethyl chloride.  With sterile technique and under real time ultrasound  guidance: Greater trochanteric area was visualized and patient's bursa was noted. A 22-gauge 3 inch needle was inserted and 4 cc of 0.5% Marcaine and 1 cc of Kenalog 40 mg/dL was injected. Pictures taken Completed without difficulty  Pain immediately resolved suggesting accurate placement of the medication.  Advised to call if fevers/chills, erythema, induration, drainage, or persistent bleeding.  Images permanently stored and available for review in the ultrasound unit.  Impression: Technically successful ultrasound guided injection.    ASSESSMENT AND PLAN  1. Recurrent syncope  -normal coronaries and normal LVEF  2. Anemia: hgb 12.2 --> 10.6 --> 10.9 --> 9.4. Workup per primary team  3. UTI with positive nitrite: febirle with Tmax 100.5 last night, per IM  4. HTN 5. DM 6. OSA 7. GERD  She can be discharged from cardiac standpoint, anemia work up per primary team.   Dorothy Spark 02/11/2015

## 2015-02-12 ENCOUNTER — Ambulatory Visit: Payer: BC Managed Care – PPO | Admitting: Family Medicine

## 2015-02-12 DIAGNOSIS — E119 Type 2 diabetes mellitus without complications: Secondary | ICD-10-CM | POA: Diagnosis not present

## 2015-02-12 DIAGNOSIS — R931 Abnormal findings on diagnostic imaging of heart and coronary circulation: Secondary | ICD-10-CM | POA: Diagnosis not present

## 2015-02-12 DIAGNOSIS — R55 Syncope and collapse: Secondary | ICD-10-CM | POA: Diagnosis not present

## 2015-02-12 DIAGNOSIS — E876 Hypokalemia: Secondary | ICD-10-CM

## 2015-02-12 DIAGNOSIS — I209 Angina pectoris, unspecified: Secondary | ICD-10-CM | POA: Diagnosis not present

## 2015-02-12 LAB — CBC
HCT: 28.6 % — ABNORMAL LOW (ref 36.0–46.0)
HEMOGLOBIN: 9.6 g/dL — AB (ref 12.0–15.0)
MCH: 29.2 pg (ref 26.0–34.0)
MCHC: 33.6 g/dL (ref 30.0–36.0)
MCV: 86.9 fL (ref 78.0–100.0)
Platelets: 224 10*3/uL (ref 150–400)
RBC: 3.29 MIL/uL — AB (ref 3.87–5.11)
RDW: 15.3 % (ref 11.5–15.5)
WBC: 4 10*3/uL (ref 4.0–10.5)

## 2015-02-12 LAB — GLUCOSE, CAPILLARY: Glucose-Capillary: 81 mg/dL (ref 65–99)

## 2015-02-12 LAB — BASIC METABOLIC PANEL
Anion gap: 11 (ref 5–15)
BUN: 10 mg/dL (ref 6–20)
CHLORIDE: 103 mmol/L (ref 101–111)
CO2: 22 mmol/L (ref 22–32)
Calcium: 8.5 mg/dL — ABNORMAL LOW (ref 8.9–10.3)
Creatinine, Ser: 1.05 mg/dL — ABNORMAL HIGH (ref 0.44–1.00)
GFR calc Af Amer: >60 mL/min (ref >60)
GFR calc non Af Amer: 60 mL/min — ABNORMAL LOW (ref >60)
GLUCOSE: 92 mg/dL (ref 65–99)
POTASSIUM: 4.1 mmol/L (ref 3.5–5.1)
Sodium: 136 mmol/L (ref 135–145)

## 2015-02-12 MED ORDER — CEFUROXIME AXETIL 500 MG PO TABS: 500.0000 mg | ORAL_TABLET | Freq: Two times a day (BID) | ORAL | Status: DC

## 2015-02-12 MED ORDER — VITAMIN B-12 100 MCG PO TABS: 100.0000 ug | ORAL_TABLET | Freq: Every day | ORAL | Status: DC

## 2015-02-12 MED ORDER — CYANOCOBALAMIN 1000 MCG/ML IJ SOLN
1000.0000 ug | Freq: Once | INTRAMUSCULAR | Status: AC
  Administered 2015-02-12: 1000 ug via INTRAMUSCULAR
  Filled 2015-02-12: qty 1

## 2015-02-12 NOTE — Discharge Instructions (Signed)
Nonspecific Chest Pain  °Chest pain can be caused by many different conditions. There is always a chance that your pain could be related to something serious, such as a heart attack or a blood clot in your lungs. Chest pain can also be caused by conditions that are not life-threatening. If you have chest pain, it is very important to follow up with your health care provider. °CAUSES  °Chest pain can be caused by: °· Heartburn. °· Pneumonia or bronchitis. °· Anxiety or stress. °· Inflammation around your heart (pericarditis) or lung (pleuritis or pleurisy). °· A blood clot in your lung. °· A collapsed lung (pneumothorax). It can develop suddenly on its own (spontaneous pneumothorax) or from trauma to the chest. °· Shingles infection (varicella-zoster virus). °· Heart attack. °· Damage to the bones, muscles, and cartilage that make up your chest wall. This can include: °¨ Bruised bones due to injury. °¨ Strained muscles or cartilage due to frequent or repeated coughing or overwork. °¨ Fracture to one or more ribs. °¨ Sore cartilage due to inflammation (costochondritis). °RISK FACTORS  °Risk factors for chest pain may include: °· Activities that increase your risk for trauma or injury to your chest. °· Respiratory infections or conditions that cause frequent coughing. °· Medical conditions or overeating that can cause heartburn. °· Heart disease or family history of heart disease. °· Conditions or health behaviors that increase your risk of developing a blood clot. °· Having had chicken pox (varicella zoster). °SIGNS AND SYMPTOMS °Chest pain can feel like: °· Burning or tingling on the surface of your chest or deep in your chest. °· Crushing, pressure, aching, or squeezing pain. °· Dull or sharp pain that is worse when you move, cough, or take a deep breath. °· Pain that is also felt in your back, neck, shoulder, or arm, or pain that spreads to any of these areas. °Your chest pain may come and go, or it may stay  constant. °DIAGNOSIS °Lab tests or other studies may be needed to find the cause of your pain. Your health care provider may have you take a test called an ambulatory ECG (electrocardiogram). An ECG records your heartbeat patterns at the time the test is performed. You may also have other tests, such as: °· Transthoracic echocardiogram (TTE). During echocardiography, sound waves are used to create a picture of all of the heart structures and to look at how blood flows through your heart. °· Transesophageal echocardiogram (TEE). This is a more advanced imaging test that obtains images from inside your body. It allows your health care provider to see your heart in finer detail. °· Cardiac monitoring. This allows your health care provider to monitor your heart rate and rhythm in real time. °· Holter monitor. This is a portable device that records your heartbeat and can help to diagnose abnormal heartbeats. It allows your health care provider to track your heart activity for several days, if needed. °· Stress tests. These can be done through exercise or by taking medicine that makes your heart beat more quickly. °· Blood tests. °· Imaging tests. °TREATMENT  °Your treatment depends on what is causing your chest pain. Treatment may include: °· Medicines. These may include: °¨ Acid blockers for heartburn. °¨ Anti-inflammatory medicine. °¨ Pain medicine for inflammatory conditions. °¨ Antibiotic medicine, if an infection is present. °¨ Medicines to dissolve blood clots. °¨ Medicines to treat coronary artery disease. °· Supportive care for conditions that do not require medicines. This may include: °¨ Resting. °¨ Applying heat   or cold packs to injured areas. °¨ Limiting activities until pain decreases. °HOME CARE INSTRUCTIONS °· If you were prescribed an antibiotic medicine, finish it all even if you start to feel better. °· Avoid any activities that bring on chest pain. °· Do not use any tobacco products, including  cigarettes, chewing tobacco, or electronic cigarettes. If you need help quitting, ask your health care provider. °· Do not drink alcohol. °· Take medicines only as directed by your health care provider. °· Keep all follow-up visits as directed by your health care provider. This is important. This includes any further testing if your chest pain does not go away. °· If heartburn is the cause for your chest pain, you may be told to keep your head raised (elevated) while sleeping. This reduces the chance that acid will go from your stomach into your esophagus. °· Make lifestyle changes as directed by your health care provider. These may include: °¨ Getting regular exercise. Ask your health care provider to suggest some activities that are safe for you. °¨ Eating a heart-healthy diet. A registered dietitian can help you to learn healthy eating options. °¨ Maintaining a healthy weight. °¨ Managing diabetes, if necessary. °¨ Reducing stress. °SEEK MEDICAL CARE IF: °· Your chest pain does not go away after treatment. °· You have a rash with blisters on your chest. °· You have a fever. °SEEK IMMEDIATE MEDICAL CARE IF:  °· Your chest pain is worse. °· You have an increasing cough, or you cough up blood. °· You have severe abdominal pain. °· You have severe weakness. °· You faint. °· You have chills. °· You have sudden, unexplained chest discomfort. °· You have sudden, unexplained discomfort in your arms, back, neck, or jaw. °· You have shortness of breath at any time. °· You suddenly start to sweat, or your skin gets clammy. °· You feel nauseous or you vomit. °· You suddenly feel light-headed or dizzy. °· Your heart begins to beat quickly, or it feels like it is skipping beats. °These symptoms may represent a serious problem that is an emergency. Do not wait to see if the symptoms will go away. Get medical help right away. Call your local emergency services (911 in the [place] Do not drive yourself to the hospital. °  °This  information is not intended to replace advice given to you by your health care provider. Make sure you discuss any questions you have with your health care provider. °  °Document Released: 10/13/2004 Document Revised: 01/24/2014 Document Reviewed: 08/09/2013 °[employer] Interactive Patient Education ©2016 [employer] ° °

## 2015-02-12 NOTE — Discharge Summary (Signed)
Physician Discharge Summary  Nancy Acevedo [id number] DOB: 23-Mar-1960 DOA: 02/09/2015  PCP: Binnie Rail, MD  Admit date: 02/09/2015 Discharge date: 02/12/2015  Time spent: 45 minutes  Recommendations for Outpatient Follow-up:  Patient will be discharged to home.  Patient will need to follow up with primary care provider within one week of discharge.  Patient should continue medications as prescribed.  Patient should follow a heart healthy/carb modified diet.   Discharge Diagnoses:  Syncope Escherichia coli UTI Hypertension  Normocytic anemia Vitamin B 12 deficiency  Discharge Condition: Stable  Diet recommendation: Heart healthy/carb modified  Filed Weights   02/10/15 1700 02/11/15 0456 02/12/15 0531  Weight: 96.344 kg (212 lb 6.4 oz) 97.75 kg (215 lb 8 oz) 96.616 kg (213 lb)    History of present illness:  on 02/09/2015 by Dr. Gennaro Africa Patient is a 55 yo female with history of HTN who came here with cc of syncope.She felt mild chest discomfort earlier today at work that she related to indigestion that was followed by feeling dizzy while she was walking to the bathroom. She found herself on the floor in the bathroom 5-10 min later with no tongue biting/incontinence and asked her friend to call EMS. She denied dyspnea. No cough/fever/chills/N/V/D/C/dysuria. No other complaints. She said she has had occasional and sporadic episodes of sweating at night before and had a prior syncopal episode in the past.   Hospital Course:  Syncope -Unknown etiology -Echocardiogram: EF 60-65%, mild LVH -Stress test was intermediate -Patient had cardiac catheterization showed normal coronaries and normal LVEF -Orthostatic vitals negative  Escherichia coli Urinary tract infection -Urine culture positive for Escherichia coli, pansensitive -Currently on ceftriaxone, will discharge with Ceftin   Hypertension -Continue amlodipine, metoprolol, Avapro, on a Dean   Normocytic  Anemia -Hemoglobin currently 9.6 -Anemia panel: Iron 49, Ferritin 589, B12 99 -Patient denies dark stools or BRBPR -Had colonoscopy about one year ago and states it was normal (per patient) -EGD 10/2014: no bleeding noted, placed on protonix BID and diflucan -Patient should have repeat CBC in one week and follow up with PCP for further evaluation  Vitamin B12 def -B12: 99 -Given B12 injection -Will start oral supplementation  Procedures  Heart catheterization Stress test  Consults  Cardiology  Discharge Exam: Filed Vitals:   02/11/15 2007 02/12/15 0531  BP: 115/68 121/70  Pulse: 71 74  Temp: 99.1 F (37.3 C) 99.9 F (37.7 C)  Resp: 18 18   Exam  General: Well developed, well nourished, NAD, appears stated age  HEENT: NCAT, mucous membranes moist.   Cardiovascular: S1 S2 auscultated,no murmurs, RRR  Respiratory: Clear to auscultation bilaterally   Abdomen: Soft, obese, nontender, nondistended, + bowel sounds  Extremities: warm dry without cyanosis clubbing or edema  Neuro: AAOx3,nonfocal   Psych: Appropriate mood and affect  Discharge Instructions      Discharge Instructions    Diet - low sodium heart healthy    Complete by:  As directed      Diet - low sodium heart healthy    Complete by:  As directed      Discharge instructions    Complete by:  As directed   your chest pain was not cardiac Follow up with your primary MD     Discharge instructions    Complete by:  As directed   See changes to meds Get blood work at primary MD office soon     Discharge instructions    Complete by:  As directed  Patient will be discharged to home.  Patient will need to follow up with primary care provider within one week of discharge.  Patient should continue medications as prescribed.  Patient should follow a heart healthy/carb modified diet.     Increase activity slowly    Complete by:  As directed      Increase activity slowly    Complete by:  As directed              Medication List    STOP taking these medications        Olmesartan-Amlodipine-HCTZ 40-10-25 MG Tabs  Commonly known as:  TRIBENZOR     predniSONE 20 MG tablet  Commonly known as:  DELTASONE      TAKE these medications        amLODipine 10 MG tablet  Commonly known as:  NORVASC  Take 1 tablet (10 mg total) by mouth daily.     cefUROXime 500 MG tablet  Commonly known as:  CEFTIN  Take 1 tablet (500 mg total) by mouth 2 (two) times daily with a meal.     cloNIDine 0.1 MG tablet  Commonly known as:  CATAPRES  Take 1 tablet (0.1 mg total) by mouth 3 (three) times daily.     Diclofenac Sodium 2 % Soln  Apply 1 pump twice daily.     irbesartan 300 MG tablet  Commonly known as:  AVAPRO  Take 1 tablet (300 mg total) by mouth daily.     metoprolol succinate 100 MG 24 hr tablet  Commonly known as:  TOPROL-XL  Take 1 tablet (100 mg total) by mouth daily. Take with or immediately following a meal.     oxyCODONE-acetaminophen 5-325 MG tablet  Commonly known as:  PERCOCET  Take 1-2 tablets by mouth every 4 (four) hours as needed.     pantoprazole 40 MG tablet  Commonly known as:  PROTONIX  Take 1 tablet (40 mg total) by mouth 2 (two) times daily.     vitamin B-12 100 MCG tablet  Commonly known as:  CYANOCOBALAMIN  Take 1 tablet (100 mcg total) by mouth daily.       Allergies  Allergen Reactions  . Lexapro [Escitalopram] Itching  . Spironolactone     rash   Follow-up Information    Follow up with Binnie Rail, MD. Schedule an appointment as soon as possible for a visit in 1 week.   Specialty:  Internal Medicine   Why:  Hospital follow up   Contact information:   [place] [address] [phone]        The results of significant diagnostics from this hospitalization (including imaging, microbiology, ancillary and laboratory) are listed below for reference.    Significant Diagnostic Studies: Dg Chest 2 View  02/09/2015  CLINICAL  DATA:  Dizziness.  Syncope. EXAM: CHEST  2 VIEW COMPARISON:  10/17/2014 FINDINGS: Dextroconvex thoracic scoliosis, mild.  Mild thoracic spondylosis. The lungs appear clear. Cardiac and mediastinal contours normal. No pleural effusion identified. IMPRESSION: 1. No acute findings. 2. Mild thoracic spondylosis and mild dextroconvex thoracic scoliosis. Electronically Signed   By: Van Clines M.D.   On: 02/09/2015 11:38   Ct Head Wo Contrast  02/09/2015  CLINICAL DATA:  Syncope, frontal head pain. Found prone on bathroom floor. EXAM: CT HEAD WITHOUT CONTRAST TECHNIQUE: Contiguous axial images were obtained from the base of the skull through the vertex without intravenous contrast. COMPARISON:  11/17/2009 and report from 02/27/2003 FINDINGS: The brainstem,  cerebellum, cerebral peduncles, thalami, basal ganglia, basilar cisterns, and ventricular system appear within normal limits. No intracranial hemorrhage, mass lesion, or acute CVA. Chronic right maxillary sinusitis. Old right medial orbital wall fracture without extraocular muscle herniation. IMPRESSION: 1. No acute intracranial findings. 2. Old right medial orbital wall fracture. 3. Mild chronic right maxillary sinusitis. Electronically Signed   By: Van Clines M.D.   On: 02/09/2015 11:34   Nm Myocar Multi W/spect W/wall Motion / Ef  02/10/2015   Blood pressure demonstrated a hypertensive response to exercise.  There was no ST segment deviation noted during stress.  Defect 1: There is a medium defect of moderate severity present in the basal inferior, basal inferolateral, mid inferior and mid inferolateral location.  Findings consistent with ischemia.  This is an intermediate risk study.  The left ventricular ejection fraction is normal (55-65%).  There was mild hypokinesis of the basal to mid inferior walls.    02/10/2015   Blood pressure demonstrated a hypertensive response to exercise.  There was no ST segment deviation noted during  stress.  Defect 1: There is a medium defect of moderate severity present in the basal inferior, basal inferolateral, mid inferior and mid inferolateral location.  Findings consistent with ischemia.  This is an intermediate risk study.    [place] Extrem Low Left Ltd  01/16/2015  [facility] US performed of: left This study was ordered, performed, and interpreted by Charlann Boxer D.O. Hip: Trochanteric bursa with significant hypoechoic changes and swelling Acetabular labrum visualized and without tears, displacement, or effusion in joint. Femoral neck appears unremarkable without increased power doppler signal along Cortex. IMPRESSION: Greater trochanter bursitis Procedure: Real-time Ultrasound Guided Injection of left greater trochanteric bursitis secondary to patient's body habitus Device: GE Logiq E  Ultrasound guided injection is preferred based studies that show increased duration, increased effect, greater accuracy, decreased procedural pain, increased response rate, and decreased cost with ultrasound guided versus blind injection.  Verbal informed consent obtained.  Time-out conducted.  Noted no overlying erythema, induration, or other signs of local infection.  Skin prepped in a sterile fashion.  Local anesthesia: Topical Ethyl chloride.  With sterile technique and under real time ultrasound guidance: Greater trochanteric area was visualized and patient's bursa was noted. A 22-gauge 3 inch needle was inserted and 4 cc of 0.5% Marcaine and 1 cc of Kenalog 40 mg/dL was injected. Pictures taken Completed without difficulty  Pain immediately resolved suggesting accurate placement of the medication.  Advised to call if fevers/chills, erythema, induration, drainage, or persistent bleeding.  Images permanently stored and available for review in the ultrasound unit.  Impression: Technically successful ultrasound guided injection.    Microbiology: Recent Results (from the past 240 hour(s))  Urine culture      Status: None   Collection Time: 02/09/15 12:09 PM  Result Value Ref Range Status   Specimen Description URINE, CLEAN CATCH  Final   Special Requests NONE  Final   Culture >=100,000 COLONIES/mL ESCHERICHIA COLI  Final   Report Status 02/11/2015 FINAL  Final   Organism ID, Bacteria ESCHERICHIA COLI  Final      Susceptibility   Escherichia coli - MIC*    AMPICILLIN <=2 SENSITIVE Sensitive     CEFAZOLIN <=4 SENSITIVE Sensitive     CEFTRIAXONE <=1 SENSITIVE Sensitive     CIPROFLOXACIN <=0.25 SENSITIVE Sensitive     GENTAMICIN <=1 SENSITIVE Sensitive     IMIPENEM <=0.25 SENSITIVE Sensitive     NITROFURANTOIN <=16 SENSITIVE Sensitive  TRIMETH/SULFA <=20 SENSITIVE Sensitive     AMPICILLIN/SULBACTAM <=2 SENSITIVE Sensitive     PIP/TAZO <=4 SENSITIVE Sensitive     * >=100,000 COLONIES/mL ESCHERICHIA COLI     Labs: Basic Metabolic Panel:  Recent Labs Lab 02/09/15 1207 02/10/15 0354 02/12/15 0317  NA 139 141 136  K 3.1* 3.9 4.1  CL 101 104 103  CO2 27 26 22   GLUCOSE 108* 128* 92  BUN 14 13 10   CREATININE 1.14* 1.16* 1.05*  CALCIUM 8.5* 8.4* 8.5*   Liver Function Tests:  Recent Labs Lab 02/10/15 0354  AST 29  ALT 32  ALKPHOS 39  BILITOT 0.5  PROT 5.7*  ALBUMIN 2.9*   No results for input(s): LIPASE, AMYLASE in the last 168 hours. No results for input(s): AMMONIA in the last 168 hours. CBC:  Recent Labs Lab 02/09/15 1207 02/10/15 0354 02/11/15 0904 02/12/15 0317  WBC 7.3 5.0 4.8 4.0  NEUTROABS  --  3.0  --   --   HGB 10.9* 9.4* 9.9* 9.6*  HCT 32.8* 29.2* 29.9* 28.6*  MCV 87.2 86.9 87.4 86.9  PLT 321 247 259 224   Cardiac Enzymes:  Recent Labs Lab 02/09/15 2103  TROPONINI <0.03   BNP: BNP (last 3 results) No results for input(s): BNP in the last 8760 hours.  ProBNP (last 3 results) No results for input(s): PROBNP in the last 8760 hours.  CBG:  Recent Labs Lab 02/09/15 1136 02/10/15 0638 02/11/15 0643 02/12/15 0656  GLUCAP 68 108* 87 81        Signed:  Karee Christopherson  [facility] 02/12/2015, 9:51 AM

## 2015-02-12 NOTE — Progress Notes (Signed)
Pt being discharged home via wheelchair with family. Pt alert and oriented x4. VSS. Pt c/o no pain at this time. No signs of respiratory distress. Education complete and care plans resolved. IV removed with catheter intact and pt tolerated well. No further issues at this time. Pt to follow up with PCP. Celines Femia R, RN 

## 2015-02-16 ENCOUNTER — Encounter: Payer: Self-pay | Admitting: Pulmonary Disease

## 2015-02-16 ENCOUNTER — Ambulatory Visit (INDEPENDENT_AMBULATORY_CARE_PROVIDER_SITE_OTHER): Payer: BC Managed Care – PPO | Admitting: Pulmonary Disease

## 2015-02-16 VITALS — BP 118/82 | HR 81 | Ht 64.0 in | Wt 208.6 lb

## 2015-02-16 DIAGNOSIS — G4733 Obstructive sleep apnea (adult) (pediatric): Secondary | ICD-10-CM

## 2015-02-16 DIAGNOSIS — Z9989 Dependence on other enabling machines and devices: Principal | ICD-10-CM

## 2015-02-16 DIAGNOSIS — Z23 Encounter for immunization: Secondary | ICD-10-CM

## 2015-02-16 NOTE — Patient Instructions (Signed)
Will arrange for new CPAP supplies and get report from CPAP machine  Follow up in 3 months

## 2015-02-16 NOTE — Progress Notes (Signed)
Current Outpatient Prescriptions on File Prior to Visit  Medication Sig  . amLODipine (NORVASC) 10 MG tablet Take 1 tablet (10 mg total) by mouth daily.  . cloNIDine (CATAPRES) 0.1 MG tablet Take 1 tablet (0.1 mg total) by mouth 3 (three) times daily.  . Diclofenac Sodium 2 % SOLN Apply 1 pump twice daily.  . irbesartan (AVAPRO) 300 MG tablet Take 1 tablet (300 mg total) by mouth daily.  . metoprolol succinate (TOPROL-XL) 100 MG 24 hr tablet Take 1 tablet (100 mg total) by mouth daily. Take with or immediately following a meal.  . pantoprazole (PROTONIX) 40 MG tablet Take 1 tablet (40 mg total) by mouth 2 (two) times daily. (Patient taking differently: Take 40 mg by mouth as needed. )   No current facility-administered medications on file prior to visit.     Chief Complaint  Patient presents with  . Follow-up    Last seen 2014. [id number]. Pt has not used CPAP x 1 month d/t not being able to get supplies. Pt notes increased snoring when using CPAP. Discuss switching DME.     Tests HST 03/28/12 >> AHI 40.8, SpO2 low 62% ONO with CPAP and RA 05/21/12 >> test time 4 hrs 18 min. Mean SpO2 94%, low SpO2 90%.  Past medical hx HTN, HA, DM, Gout, GERD  Past surgical hx, Allergies, Family hx, Social hx all reviewed.  Vital Signs BP 118/82 mmHg  Pulse 81  Ht 5\' 4"  (1.626 m)  Wt 208 lb 9.6 oz (94.62 kg)  BMI 35.79 kg/m2  SpO2 100%  History of Present Illness Nancy Acevedo is a 55 y.o. female with OSA.  I last saw her in 2014.  She was using CPAP until one month ago.  Her mask wasn't fitting.  She contact her DME, but was told she couldn't get new supplies >> likely because she didn't have f/u to renew supplies.    Her sleep is much worse w/o CPAP.  She snores, and this is affecting her relations.  She is more sleepy during the day w/o CPAP.  She did snore some still with CPAP, but not as bas as now.   Physical Exam  General - No distress ENT - No sinus tenderness, no oral  exudate, no LAN, MP 3, enlarged tongue Cardiac - s1s2 regular, no murmur Chest - No wheeze/rales/dullness Back - No focal tenderness Abd - Soft, non-tender Ext - No edema Neuro - Normal strength Skin - No rashes Psych - normal mood, and behavior   Assessment/Plan  Obstructive sleep apnea. Plan: - will arrange for new CPAP supplies - will get her current CPAP download and then determine if she needs adjustment to her settings - if her difficulties persist, then she might need in lab titration study   Patient Instructions  Will arrange for new CPAP supplies and get report from CPAP machine  Follow up in 3 months     Chesley Mires, MD [place] [facility] Pager:  [phone]

## 2015-02-19 ENCOUNTER — Encounter: Payer: Self-pay | Admitting: Family

## 2015-02-19 ENCOUNTER — Ambulatory Visit (INDEPENDENT_AMBULATORY_CARE_PROVIDER_SITE_OTHER): Payer: BC Managed Care – PPO | Admitting: Family

## 2015-02-19 VITALS — BP 118/80 | HR 93 | Temp 98.6°F | Resp 16 | Ht 64.0 in | Wt 207.8 lb

## 2015-02-19 DIAGNOSIS — H8111 Benign paroxysmal vertigo, right ear: Secondary | ICD-10-CM | POA: Diagnosis not present

## 2015-02-19 DIAGNOSIS — H8110 Benign paroxysmal vertigo, unspecified ear: Secondary | ICD-10-CM | POA: Insufficient documentation

## 2015-02-19 MED ORDER — MECLIZINE HCL 25 MG PO TABS: 25.0000 mg | ORAL_TABLET | Freq: Four times a day (QID) | ORAL | Status: DC | PRN

## 2015-02-19 NOTE — Progress Notes (Signed)
Subjective:    Patient ID: Nancy Acevedo, female    DOB: Jun 23, 1960, 55 y.o.   MRN: [id number]  Chief Complaint  Patient presents with  . Hospitalization Follow-up    has a constant dizziness and lightheadness, no more episodes of passing out    HPI:  Nancy Acevedo is a 55 y.o. female who  has a past medical history of Hypertension; Headaches, cluster; History of hyperglycemia (1993); Diabetes mellitus, type 2 ([place]) (dx 01/2011); OSA (obstructive sleep apnea); EP (ectopic pregnancy); Candidiasis of mouth; Sleep apnea; Gout; Syncope (02/09/15); Complication of anesthesia; Syncope (02/10/2015); and GERD (gastroesophageal reflux disease). and presents today for a hospitalization follow up.  Recently evaluated in the emergency department and admitted to the hospital for mild chest pain and syncope. She did have a headache following a syncopal episode. Vital signs are stable with labs unremarkable with the exception of a potassium of 3.1 and hemoglobin 10.9. EKG was concerning for potential ST segment elevation. Cardiac cath showed normal coronary arteries and left ventricular ejection fraction. Echocardiogram showed ejection fraction of approximately 60-65%. Orthostatic vitals were negative. Urine was positive for Escherichia coli and treated for cystitis and treated with ceftriaxone and Ceftin. She was also given a B12 injection. She was discharged and instructed to follow-up with primary care. All hospital records, tests, and labs were reviewed in detail.  Continues to experience the associated symptoms of dizziness and lightheadedness with no further episodes of chest pain or syncope. Describes the feeling as the room spinning around her and is constant. Modifying factors include changing positions slowly which does not help significantly. The severity has been enough to affect her normal activities of daily living.   Allergies  Allergen Reactions  . Lexapro [Escitalopram] Itching  .  Spironolactone     rash     Current Outpatient Prescriptions on File Prior to Visit  Medication Sig Dispense Refill  . amLODipine (NORVASC) 10 MG tablet Take 1 tablet (10 mg total) by mouth daily. 30 tablet 0  . cloNIDine (CATAPRES) 0.1 MG tablet Take 1 tablet (0.1 mg total) by mouth 3 (three) times daily. 180 tablet 3  . Diclofenac Sodium 2 % SOLN Apply 1 pump twice daily. 112 g 3  . irbesartan (AVAPRO) 300 MG tablet Take 1 tablet (300 mg total) by mouth daily. 30 tablet 0  . metoprolol succinate (TOPROL-XL) 100 MG 24 hr tablet Take 1 tablet (100 mg total) by mouth daily. Take with or immediately following a meal. 90 tablet 1  . pantoprazole (PROTONIX) 40 MG tablet Take 1 tablet (40 mg total) by mouth 2 (two) times daily. (Patient taking differently: Take 40 mg by mouth as needed. ) 60 tablet 2   No current facility-administered medications on file prior to visit.     Past Surgical History  Procedure Laterality Date  . Tonsillectomy and adenoidectomy  2012  . Ectopic pregnancy surgery      left fallopian tube  . Myringotomy with tube placement    . Tubal ligation    . Tonsillectomy    . Appendectomy    . Cardiac catheterization N/A 02/11/2015    Procedure: Left Heart Cath and Coronary Angiography;  Surgeon: Troy Sine, MD;  Location: [facility];  Service: Cardiovascular;  Laterality: N/A;    Past Medical History  Diagnosis Date  . Hypertension   . Headaches, cluster   . History of hyperglycemia 1993  . Diabetes mellitus, type 2 ([place]) dx 01/2011  . OSA (obstructive sleep  apnea)   . EP (ectopic pregnancy)   . Candidiasis of mouth   . Sleep apnea     cpap use   . Gout   . Syncope 02/09/15  . Complication of anesthesia     " DIFFICULTY WAKING UP "  . Syncope 02/10/2015  . GERD (gastroesophageal reflux disease)      Review of Systems  Constitutional: Negative for fever and chills.  HENT: Negative for congestion, ear pain and sinus pressure.   Respiratory:  Negative for chest tightness and shortness of breath.   Cardiovascular: Negative for chest pain, palpitations and leg swelling.  Neurological: Positive for dizziness and light-headedness. Negative for seizures and weakness.      Objective:    BP 118/80 mmHg  Pulse 93  Temp(Src) 98.6 F (37 C) (Oral)  Resp 16  Ht 5\' 4"  (1.626 m)  Wt 207 lb 12.8 oz (94.257 kg)  BMI 35.65 kg/m2  SpO2 98% Nursing note and vital signs reviewed.  Physical Exam  Constitutional: She is oriented to person, place, and time. She appears well-developed and well-nourished. No distress.  HENT:  Right Ear: Hearing, tympanic membrane, external ear and ear canal normal.  Left Ear: Hearing, tympanic membrane, external ear and ear canal normal.  Mouth/Throat: Uvula is midline, oropharynx is clear and moist and mucous membranes are normal.  Eyes:  Dix-Hallpike greater on right than left. No nystagmus noted, however symptoms increased.  Cardiovascular: Normal rate, regular rhythm, normal heart sounds and intact distal pulses.   Pulmonary/Chest: Effort normal and breath sounds normal.  Neurological: She is alert and oriented to person, place, and time.  Skin: Skin is warm and dry.  Psychiatric: She has a normal mood and affect. Her behavior is normal. Judgment and thought content normal.       Assessment & Plan:   Problem List Items Addressed This Visit      Nervous and Auditory   BPPV (benign paroxysmal positional vertigo) - Primary    Symptoms and exam consistent with benign paroxysmal positional vertigo. Hallpike maneuver greater on the right side than on the left. Start meclizine. Refer to physical therapy and ear nose and throat. Continue conservative treatment as needed. Follow up if symptoms worsen or improve.       Relevant Orders   Ambulatory referral to ENT   Ambulatory referral to Physical Therapy

## 2015-02-19 NOTE — Assessment & Plan Note (Signed)
Symptoms and exam consistent with benign paroxysmal positional vertigo. Hallpike maneuver greater on the right side than on the left. Start meclizine. Refer to physical therapy and ear nose and throat. Continue conservative treatment as needed. Follow up if symptoms worsen or improve.

## 2015-02-19 NOTE — Patient Instructions (Addendum)
Thank you for choosing [facility].  Summary/Instructions:  Please continue to drink plenty of nonalcoholic/non-caffeinated beverages.  Change position slowly.  Your prescription(s) have been submitted to your pharmacy or been printed and provided for you. Please take as directed and contact our office if you believe you are having problem(s) with the medication(s) or have any questions.  If your symptoms worsen or fail to improve, please contact our office for further instruction, or in case of emergency go directly to the emergency room at the closest medical facility.    Benign Positional Vertigo Vertigo is the feeling that you or your surroundings are moving when they are not. Benign positional vertigo is the most common form of vertigo. The cause of this condition is not serious (is benign). This condition is triggered by certain movements and positions (is positional). This condition can be dangerous if it occurs while you are doing something that could endanger you or others, such as driving.  CAUSES In many cases, the cause of this condition is not known. It may be caused by a disturbance in an area of the inner ear that helps your brain to sense movement and balance. This disturbance can be caused by a viral infection (labyrinthitis), head injury, or repetitive motion. RISK FACTORS This condition is more likely to develop in:  Women.  People who are 4 years of age or older. SYMPTOMS Symptoms of this condition usually happen when you move your head or your eyes in different directions. Symptoms may start suddenly, and they usually last for less than a minute. Symptoms may include:  Loss of balance and falling.  Feeling like you are spinning or moving.  Feeling like your surroundings are spinning or moving.  Nausea and vomiting.  Blurred vision.  Dizziness.  Involuntary eye movement (nystagmus). Symptoms can be mild and cause only slight annoyance, or they can be  severe and interfere with daily life. Episodes of benign positional vertigo may return (recur) over time, and they may be triggered by certain movements. Symptoms may improve over time. DIAGNOSIS This condition is usually diagnosed by medical history and a physical exam of the head, neck, and ears. You may be referred to a health care provider who specializes in ear, nose, and throat (ENT) problems (otolaryngologist) or a provider who specializes in disorders of the nervous system (neurologist). You may have additional testing, including:  MRI.  A CT scan.  Eye movement tests. Your health care provider may ask you to change positions quickly while he or she watches you for symptoms of benign positional vertigo, such as nystagmus. Eye movement may be tested with an electronystagmogram (ENG), caloric stimulation, the Dix-Hallpike test, or the roll test.  An electroencephalogram (EEG). This records electrical activity in your brain.  Hearing tests. TREATMENT Usually, your health care provider will treat this by moving your head in specific positions to adjust your inner ear back to normal. Surgery may be needed in severe cases, but this is rare. In some cases, benign positional vertigo may resolve on its own in 2-4 weeks. HOME CARE INSTRUCTIONS Safety  Move slowly.Avoid sudden body or head movements.  Avoid driving.  Avoid operating heavy machinery.  Avoid doing any tasks that would be dangerous to you or others if a vertigo episode would occur.  If you have trouble walking or keeping your balance, try using a cane for stability. If you feel dizzy or unstable, sit down right away.  Return to your normal activities as told by your  health care provider. Ask your health care provider what activities are safe for you. General Instructions  Take over-the-counter and prescription medicines only as told by your health care provider.  Avoid certain positions or movements as told by your health  care provider.  Drink enough fluid to keep your urine clear or pale yellow.  Keep all follow-up visits as told by your health care provider. This is important. SEEK MEDICAL CARE IF:  You have a fever.  Your condition gets worse or you develop new symptoms.  Your family or friends notice any behavioral changes.  Your nausea or vomiting gets worse.  You have numbness or a "pins and needles" sensation. SEEK IMMEDIATE MEDICAL CARE IF:  You have difficulty speaking or moving.  You are always dizzy.  You faint.  You develop severe headaches.  You have weakness in your legs or arms.  You have changes in your hearing or vision.  You develop a stiff neck.  You develop sensitivity to light.   This information is not intended to replace advice given to you by your health care provider. Make sure you discuss any questions you have with your health care provider.   Document Released: 10/11/2005 Document Revised: 09/24/2014 Document Reviewed: 04/28/2014 Elsevier Interactive Patient Education [employer].

## 2015-03-24 ENCOUNTER — Telehealth: Payer: Self-pay | Admitting: Pulmonary Disease

## 2015-03-24 DIAGNOSIS — G4733 Obstructive sleep apnea (adult) (pediatric): Secondary | ICD-10-CM

## 2015-03-24 NOTE — Telephone Encounter (Signed)
Auto CPAP 12/27/14 to 01/25/15 >> used on 5 of 30 nights with average 3 hrs 4 min.  Average AHI 0.6 with mean CPAP 7 cm H2O and 90% CPAP 8 cm H2O.   Will have my nurse inform pt that CPAP report shows good control of sleep apnea when she uses CPAP.  She needs to use CPAP whenever she is asleep to get maximal benefit from therapy.

## 2015-03-25 NOTE — Telephone Encounter (Signed)
Results have been explained to patient, pt expressed understanding.  Order was placed in Jan 2017 for new CPAP supplies but the patient could not afford them.  Patient states that [employer] is working with her on getting her some discounted/extra/free supplies - states that she was told they were going to mail her supplies but she has yet to receive anything. I advised her to follow up with the person she spoke to and if nothing is accomplished this week to call back and we will try from our end.  Will await call back from patient.

## 2015-03-31 NOTE — Telephone Encounter (Signed)
Called and spoke with patient regarding Dr. Juanetta Gosling recommendations. Patient said that she will check the online supplies to see if they are cheaper. She is also requesting that we send order to another DME company to see if she can get supplies cheaper through them with her insurance. Order entered for CPAP supplies through new DME. Nothing further needed.

## 2015-03-31 NOTE — Telephone Encounter (Signed)
[id number] calling back still hasnt got her cpap stuff

## 2015-03-31 NOTE — Telephone Encounter (Signed)
She might be able to find less expensive supplies if she looks on line for CPAP supplies.  Otherwise, we could ask [facility] to refer her to a different DME.

## 2015-03-31 NOTE — Telephone Encounter (Signed)
Called [facility] to get status on CPAP supply order.   [facility] states that they spoke with patient and advised patient that they are going to put the order on hold because she cannot afford to pay for the supplies. 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  She said that she cannot afford to pay for the CPAP supplies.  She said that she has not been using her machine because she has washed it so much that it is worn down.  She needs new supplies in order to continue using the CPAP.  Dr. Halford Chessman, any suggestions?

## 2015-04-10 ENCOUNTER — Ambulatory Visit: Payer: BC Managed Care – PPO | Admitting: Nurse Practitioner

## 2015-04-13 ENCOUNTER — Telehealth: Payer: Self-pay | Admitting: Pulmonary Disease

## 2015-04-13 NOTE — Telephone Encounter (Signed)
Sent order to [facility]. Nothing further needed as this time.

## 2015-04-13 NOTE — Telephone Encounter (Signed)
Received message from [facility] stating that they are not accepting supply only CPAP patients at this time.    Will route to [facility] to see why this is the case, and whether she needs to be referred to alternate DME.

## 2015-04-15 ENCOUNTER — Other Ambulatory Visit: Payer: BC Managed Care – PPO

## 2015-04-15 ENCOUNTER — Encounter: Payer: Self-pay | Admitting: Internal Medicine

## 2015-04-15 ENCOUNTER — Encounter: Payer: Self-pay | Admitting: Emergency Medicine

## 2015-04-15 ENCOUNTER — Other Ambulatory Visit (INDEPENDENT_AMBULATORY_CARE_PROVIDER_SITE_OTHER): Payer: BC Managed Care – PPO

## 2015-04-15 ENCOUNTER — Ambulatory Visit (INDEPENDENT_AMBULATORY_CARE_PROVIDER_SITE_OTHER): Payer: BC Managed Care – PPO | Admitting: Internal Medicine

## 2015-04-15 VITALS — BP 124/86 | HR 82 | Temp 101.2°F | Resp 16 | Wt 204.0 lb

## 2015-04-15 DIAGNOSIS — R509 Fever, unspecified: Secondary | ICD-10-CM

## 2015-04-15 DIAGNOSIS — K2100 Gastro-esophageal reflux disease with esophagitis, without bleeding: Secondary | ICD-10-CM

## 2015-04-15 DIAGNOSIS — E538 Deficiency of other specified B group vitamins: Secondary | ICD-10-CM

## 2015-04-15 DIAGNOSIS — R1310 Dysphagia, unspecified: Secondary | ICD-10-CM | POA: Diagnosis not present

## 2015-04-15 DIAGNOSIS — R197 Diarrhea, unspecified: Secondary | ICD-10-CM

## 2015-04-15 DIAGNOSIS — B370 Candidal stomatitis: Secondary | ICD-10-CM

## 2015-04-15 LAB — COMPREHENSIVE METABOLIC PANEL
ALBUMIN: 4.3 g/dL (ref 3.5–5.2)
ALT: 12 U/L (ref 0–35)
AST: 24 U/L (ref 0–37)
Alkaline Phosphatase: 70 U/L (ref 39–117)
BILIRUBIN TOTAL: 0.5 mg/dL (ref 0.2–1.2)
BUN: 9 mg/dL (ref 6–23)
CALCIUM: 9.7 mg/dL (ref 8.4–10.5)
CHLORIDE: 102 meq/L (ref 96–112)
CO2: 29 meq/L (ref 19–32)
Creatinine, Ser: 0.92 mg/dL (ref 0.40–1.20)
GFR: 82 mL/min (ref >60.00)
Glucose, Bld: 95 mg/dL (ref 70–99)
Potassium: 3.9 mEq/L (ref 3.5–5.1)
Sodium: 140 mEq/L (ref 135–145)
Total Protein: 8.5 g/dL — ABNORMAL HIGH (ref 6.0–8.3)

## 2015-04-15 LAB — CBC WITH DIFFERENTIAL/PLATELET
Basophils Absolute: 0 10*3/uL (ref 0.0–0.1)
Basophils Relative: 0 % (ref 0.0–3.0)
EOS ABS: 0 10*3/uL (ref 0.0–0.7)
Eosinophils Relative: 0.6 % (ref 0.0–5.0)
HEMATOCRIT: 29.7 % — AB (ref 36.0–46.0)
HEMOGLOBIN: 9.7 g/dL — AB (ref 12.0–15.0)
LYMPHS PCT: 24.8 % (ref 12.0–46.0)
Lymphs Abs: 1.8 10*3/uL (ref 0.7–4.0)
MCHC: 32.7 g/dL (ref 30.0–36.0)
MCV: 82.9 fl (ref 78.0–100.0)
MONOS PCT: 10.4 % (ref 3.0–12.0)
Monocytes Absolute: 0.7 10*3/uL (ref 0.1–1.0)
NEUTROS ABS: 4.6 10*3/uL (ref 1.4–7.7)
Neutrophils Relative %: 64.2 % (ref 43.0–77.0)
PLATELETS: 511 10*3/uL — AB (ref 150.0–400.0)
RBC: 3.58 Mil/uL — ABNORMAL LOW (ref 3.87–5.11)
RDW: 16.3 % — AB (ref 11.5–15.5)
WBC: 7.2 10*3/uL (ref 4.0–10.5)

## 2015-04-15 LAB — URINALYSIS, ROUTINE W REFLEX MICROSCOPIC
Bilirubin Urine: NEGATIVE
Hgb urine dipstick: NEGATIVE
Ketones, ur: NEGATIVE
Leukocytes, UA: NEGATIVE
Nitrite: NEGATIVE
Specific Gravity, Urine: 1.02 (ref 1.000–1.030)
Total Protein, Urine: 30 — AB
URINE GLUCOSE: NEGATIVE
UROBILINOGEN UA: 0.2 (ref 0.0–1.0)
pH: 6 (ref 5.0–8.0)

## 2015-04-15 LAB — TSH: TSH: 0.99 u[IU]/mL (ref 0.35–4.50)

## 2015-04-15 MED ORDER — LANSOPRAZOLE 30 MG PO CPDR: 30.0000 mg | DELAYED_RELEASE_CAPSULE | Freq: Two times a day (BID) | ORAL | Status: DC

## 2015-04-15 NOTE — Assessment & Plan Note (Signed)
?   Recurrent thrush Check culture from cheek

## 2015-04-15 NOTE — Progress Notes (Signed)
Pre visit review using our clinic review tool, if applicable. No additional management support is needed unless otherwise documented below in the visit note. 

## 2015-04-15 NOTE — Assessment & Plan Note (Signed)
Not taking B12 injections Start B12 1000 mcg daily Can do monthly injections is she wants - will discuss after acute symptoms have resolved.

## 2015-04-15 NOTE — Progress Notes (Signed)
Subjective:    Patient ID: Nancy Acevedo, female    DOB: 1960/04/24, 55 y.o.   MRN: [id number]  HPI She is here for an acute visit for throat swelling.   She states her symptoms started two weeks ago.  It feels like something is stuck in her throat.  The throat feels irriatated.  When she eats the food feels like it gets stuck.  Everyday she feels nauseous and like she has to vomit.  Her stool is runny.  She denies any blood in her stool.  She denies travel.    She is belching a lot.  She feels tightness in her chest.  She feels gerd daily. She is taking the protonix daily and it does not help. When she first started taking the medication it was working well.  She is unsure why it is no longer working.    Her body feels hot - she thought she was haivng hot flashes.  She has a fever here today.  She denies cold symptoms.  She has had a uti in the past and had no symptoms.  She denies any dysuria, but has had some increased frequency and difficulty urinating at times.   Medications and allergies reviewed with patient and updated if appropriate.  Patient Active Problem List   Diagnosis Date Noted  . BPPV (benign paroxysmal positional vertigo) 02/19/2015  . Pain in the chest   . Abnormal nuclear stress test   . Faintness   . UTI (lower urinary tract infection)   . Syncope and collapse 02/09/2015  . Chest pain 02/09/2015  . Hypokalemia 02/09/2015  . Syncope 02/09/2015  . Greater trochanteric bursitis of left hip 01/15/2015  . Left hip pain 12/01/2014  . Head or neck swelling, mass, or lump 12/01/2014  . Dysphagia 10/14/2014  . Odynophagia 10/14/2014  . Abnormal esophagram 10/14/2014  . Oral candidiasis 10/14/2014  . Pain in joint, ankle and foot 10/21/2013  . Muscle spasm of back 02/06/2013  . Nonallopathic lesion of thoracic region 02/06/2013  . Rash, drug 11/20/2012  . OSA (obstructive sleep apnea)   . Depression   . Obese   . Abnormal LFTs (liver function tests)  02/17/2011  . Diabetes mellitus, type 2 ([place]) 12/08/2010  . Hypertension   . Headaches, cluster     Current Outpatient Prescriptions on File Prior to Visit  Medication Sig Dispense Refill  . amLODipine (NORVASC) 10 MG tablet Take 1 tablet (10 mg total) by mouth daily. 30 tablet 0  . cloNIDine (CATAPRES) 0.1 MG tablet Take 1 tablet (0.1 mg total) by mouth 3 (three) times daily. 180 tablet 3  . Diclofenac Sodium 2 % SOLN Apply 1 pump twice daily. 112 g 3  . irbesartan (AVAPRO) 300 MG tablet Take 1 tablet (300 mg total) by mouth daily. 30 tablet 0  . meclizine (ANTIVERT) 25 MG tablet Take 1 tablet (25 mg total) by mouth 4 (four) times daily as needed for dizziness. 120 tablet 0  . metoprolol succinate (TOPROL-XL) 100 MG 24 hr tablet Take 1 tablet (100 mg total) by mouth daily. Take with or immediately following a meal. 90 tablet 1  . pantoprazole (PROTONIX) 40 MG tablet Take 1 tablet (40 mg total) by mouth 2 (two) times daily. (Patient taking differently: Take 40 mg by mouth as needed. ) 60 tablet 2   No current facility-administered medications on file prior to visit.    Past Medical History  Diagnosis Date  . Hypertension   . Headaches,  cluster   . History of hyperglycemia 1993  . Diabetes mellitus, type 2 ([place]) dx 01/2011  . OSA (obstructive sleep apnea)   . EP (ectopic pregnancy)   . Candidiasis of mouth   . Sleep apnea     cpap use   . Gout   . Syncope 02/09/15  . Complication of anesthesia     " DIFFICULTY WAKING UP "  . Syncope 02/10/2015  . GERD (gastroesophageal reflux disease)     Past Surgical History  Procedure Laterality Date  . Tonsillectomy and adenoidectomy  2012  . Ectopic pregnancy surgery      left fallopian tube  . Myringotomy with tube placement    . Tubal ligation    . Tonsillectomy    . Appendectomy    . Cardiac catheterization N/A 02/11/2015    Procedure: Left Heart Cath and Coronary Angiography;  Surgeon: Troy Sine, MD;  Location: [facility];  Service: Cardiovascular;  Laterality: N/A;    Social History   Social History  . Marital Status: Single    Spouse Name: N/A  . Number of Children: 2  . Years of Education: N/A   Occupational History  . tech    Social History Main Topics  . Smoking status: Never Smoker   . Smokeless tobacco: Never Used  . Alcohol Use: No  . Drug Use: No  . Sexual Activity: Not Asked   Other Topics Concern  . None   Social History Narrative   Administration at [employer]    Family History  Problem Relation Age of Onset  . Arthritis Sister   . Diabetes Mother   . Hypertension Mother   . Colon cancer Neg Hx   . Rectal cancer Neg Hx   . Stomach cancer Neg Hx     Review of Systems  Constitutional: Positive for fatigue.  HENT: Positive for trouble swallowing. Negative for congestion, ear pain and sore throat.   Respiratory: Positive for chest tightness (from gerd). Negative for cough, shortness of breath and wheezing.   Gastrointestinal: Positive for nausea, vomiting and diarrhea (two weeks). Negative for abdominal pain and blood in stool.       Gerd  Genitourinary: Positive for frequency and difficulty urinating. Negative for dysuria and hematuria.  Neurological: Negative for light-headedness and headaches.       Objective:   Filed Vitals:   04/15/15 1102  BP: 124/86  Pulse: 82  Temp: 101.2 F (38.4 C)  Resp: 16   Filed Weights   04/15/15 1102  Weight: 204 lb (92.534 kg)   Body mass index is 35 kg/(m^2).   Physical Exam Constitutional: Appears well-developed and well-nourished. No distress.  Neck: white plaque on cheeks, Neck supple. No tracheal deviation present. No thyromegaly present.  No carotid bruit. No cervical adenopathy.   Cardiovascular: Normal rate, regular rhythm and normal heart sounds.   No murmur heard.  No edema Pulmonary/Chest: Effort normal and breath sounds normal. No respiratory distress. No wheezes.  Abdomen: mild epigastric pain, no other  abdominal pain, no rebound or guarding      Assessment & Plan:   Fever No obvious course Rule out a UTI - check UA, Ucx No cold symptoms May be related to diarrhea Check blood work  Diarrhea Watery, non-bloody Check stool cultures given fever Check cbc, cmp  See Problem List for Assessment and Plan of chronic medical problems.

## 2015-04-15 NOTE — Assessment & Plan Note (Signed)
Uncontrolled GERD associated with dysphagia, odynopahgia having globus sensation EGD done 10/16 Change protonix 40 mg BID to prevacid 30 mg daily Refer back to GI

## 2015-04-15 NOTE — Patient Instructions (Addendum)
Start B12 1000 mcg daily.     Test(s) ordered today. Your results will be released to [facility] (or called to you) after review, usually within 72hours after test completion. If any changes need to be made, you will be notified at that same time.   Medications reviewed and updated.  Changes include discontinuing pantoprazole and starting lansoprazole twice daily.   Your prescription(s) have been submitted to your pharmacy. Please take as directed and contact our office if you believe you are having problem(s) with the medication(s).

## 2015-04-16 ENCOUNTER — Other Ambulatory Visit: Payer: BC Managed Care – PPO

## 2015-04-17 ENCOUNTER — Telehealth: Payer: Self-pay | Admitting: Internal Medicine

## 2015-04-17 ENCOUNTER — Ambulatory Visit (INDEPENDENT_AMBULATORY_CARE_PROVIDER_SITE_OTHER): Payer: BC Managed Care – PPO | Admitting: Family

## 2015-04-17 ENCOUNTER — Encounter: Payer: Self-pay | Admitting: Family

## 2015-04-17 VITALS — BP 126/80 | HR 97 | Temp 98.8°F | Resp 14 | Ht 64.0 in | Wt 207.0 lb

## 2015-04-17 DIAGNOSIS — K2100 Gastro-esophageal reflux disease with esophagitis, without bleeding: Secondary | ICD-10-CM

## 2015-04-17 LAB — C. DIFFICILE GDH AND TOXIN A/B
C. DIFF TOXIN A/B: NOT DETECTED
C. difficile GDH: NOT DETECTED

## 2015-04-17 LAB — URINE CULTURE
Colony Count: NO GROWTH
Organism ID, Bacteria: NO GROWTH

## 2015-04-17 MED ORDER — SUCRALFATE 1 G PO TABS: 1.0000 g | ORAL_TABLET | Freq: Three times a day (TID) | ORAL | Status: DC

## 2015-04-17 NOTE — Patient Instructions (Signed)
Thank you for choosing [facility].  Summary/Instructions:  Your prescription(s) have been submitted to your pharmacy or been printed and provided for you. Please take as directed and contact our office if you believe you are having problem(s) with the medication(s) or have any questions.  If your symptoms worsen or fail to improve, please contact our office for further instruction, or in case of emergency go directly to the emergency room at the closest medical facility.    Gastroesophageal Reflux Disease, Adult Normally, food travels down the esophagus and stays in the stomach to be digested. However, when a person has gastroesophageal reflux disease (GERD), food and stomach acid move back up into the esophagus. When this happens, the esophagus becomes sore and inflamed. Over time, GERD can create small holes (ulcers) in the lining of the esophagus.  CAUSES This condition is caused by a problem with the muscle between the esophagus and the stomach (lower esophageal sphincter, or LES). Normally, the LES muscle closes after food passes through the esophagus to the stomach. When the LES is weakened or abnormal, it does not close properly, and that allows food and stomach acid to go back up into the esophagus. The LES can be weakened by certain dietary substances, medicines, and medical conditions, including:  Tobacco use.  Pregnancy.  Having a hiatal hernia.  Heavy alcohol use.  Certain foods and beverages, such as coffee, chocolate, onions, and peppermint. RISK FACTORS This condition is more likely to develop in:  People who have an increased body weight.  People who have connective tissue disorders.  People who use NSAID medicines. SYMPTOMS Symptoms of this condition include:  Heartburn.  Difficult or painful swallowing.  The feeling of having a lump in the throat.  Abitter taste in the mouth.  Bad breath.  Having a large amount of saliva.  Having an upset or  bloated stomach.  Belching.  Chest pain.  Shortness of breath or wheezing.  Ongoing (chronic) cough or a night-time cough.  Wearing away of tooth enamel.  Weight loss. Different conditions can cause chest pain. Make sure to see your health care provider if you experience chest pain. DIAGNOSIS Your health care provider will take a medical history and perform a physical exam. To determine if you have mild or severe GERD, your health care provider may also monitor how you respond to treatment. You may also have other tests, including:  An endoscopy toexamine your stomach and esophagus with a small camera.  A test thatmeasures the acidity level in your esophagus.  A test thatmeasures how much pressure is on your esophagus.  A barium swallow or modified barium swallow to show the shape, size, and functioning of your esophagus. TREATMENT The goal of treatment is to help relieve your symptoms and to prevent complications. Treatment for this condition may vary depending on how severe your symptoms are. Your health care provider may recommend:  Changes to your diet.  Medicine.  Surgery. HOME CARE INSTRUCTIONS Diet  Follow a diet as recommended by your health care provider. This may involve avoiding foods and drinks such as:  Coffee and tea (with or without caffeine).  Drinks that containalcohol.  Energy drinks and sports drinks.  Carbonated drinks or sodas.  Chocolate and cocoa.  Peppermint and mint flavorings.  Garlic and onions.  Horseradish.  Spicy and acidic foods, including peppers, chili powder, curry powder, vinegar, hot sauces, and barbecue sauce.  Citrus fruit juices and citrus fruits, such as oranges, lemons, and limes.  Tomato-based   foods, such as red sauce, chili, salsa, and pizza with red sauce.  Fried and fatty foods, such as donuts, french fries, potato chips, and high-fat dressings.  High-fat meats, such as hot dogs and fatty cuts of red and  white meats, such as rib eye steak, sausage, ham, and bacon.  High-fat dairy items, such as whole milk, butter, and cream cheese.  Eat small, frequent meals instead of large meals.  Avoid drinking large amounts of liquid with your meals.  Avoid eating meals during the 2-3 hours before bedtime.  Avoid lying down right after you eat.  Do not exercise right after you eat. General Instructions  Pay attention to any changes in your symptoms.  Take over-the-counter and prescription medicines only as told by your health care provider. Do not take aspirin, ibuprofen, or other NSAIDs unless your health care provider told you to do so.  Do not use any tobacco products, including cigarettes, chewing tobacco, and e-cigarettes. If you need help quitting, ask your health care provider.  Wear loose-fitting clothing. Do not wear anything tight around your waist that causes pressure on your abdomen.  Raise (elevate) the head of your bed 6 inches (15cm).  Try to reduce your stress, such as with yoga or meditation. If you need help reducing stress, ask your health care provider.  If you are overweight, reduce your weight to an amount that is healthy for you. Ask your health care provider for guidance about a safe weight loss goal.  Keep all follow-up visits as told by your health care provider. This is important. SEEK MEDICAL CARE IF:  You have new symptoms.  You have unexplained weight loss.  You have difficulty swallowing, or it hurts to swallow.  You have wheezing or a persistent cough.  Your symptoms do not improve with treatment.  You have a hoarse voice. SEEK IMMEDIATE MEDICAL CARE IF:  You have pain in your arms, neck, jaw, teeth, or back.  You feel sweaty, dizzy, or light-headed.  You have chest pain or shortness of breath.  You vomit and your vomit looks like blood or coffee grounds.  You faint.  Your stool is bloody or black.  You cannot swallow, drink, or eat.     This information is not intended to replace advice given to you by your health care provider. Make sure you discuss any questions you have with your health care provider.   Document Released: 10/13/2004 Document Revised: 09/24/2014 Document Reviewed: 04/30/2014 Elsevier Interactive Patient Education 2016 [employer]  

## 2015-04-17 NOTE — Progress Notes (Signed)
Pre visit review using our clinic review tool, if applicable. No additional management support is needed unless otherwise documented below in the visit note. 

## 2015-04-17 NOTE — Progress Notes (Signed)
Subjective:    Patient ID: Nancy Acevedo, female    DOB: 01-24-1960, 55 y.o.   MRN: [id number]  Chief Complaint  Patient presents with  . Trouble swallowing    feels like something is hung in her throat says she can not keep anything on her stomach but has not thrown up, feels like her throat is closing up    HPI:  Nancy Acevedo is a 55 y.o. female who  has a past medical history of Hypertension; Headaches, cluster; History of hyperglycemia (1993); Diabetes mellitus, type 2 ([place]) (dx 01/2011); OSA (obstructive sleep apnea); EP (ectopic pregnancy); Candidiasis of mouth; Sleep apnea; Gout; Syncope (02/09/15); Complication of anesthesia; Syncope (02/10/2015); and GERD (gastroesophageal reflux disease). and presents today for for a follow up office visit.   Recently evaluated in the office for similar symptoms that started a proximally 2 weeks ago and described as feeling like there is something in her throat and it is irritated. Noted to feel nauseous every day and feel as though food is getting stuck in her throat. She was diagnosed with gastroesophageal reflux that was uncontrolled with dysphagia, odynophagia, and having globulus sensation. Previous EGD done 10/16. She was most recently change protonic from 40 mg twice a day to Prevacid 30 mg daily. Indicates that she continues to experience feelings of things getting stuck, gagging and diarrhea. She has not tried any additional medications in the past. Symptoms are "bad all over" and she continues to worsen.   Allergies  Allergen Reactions  . Lexapro [Escitalopram] Itching  . Spironolactone     rash     Current Outpatient Prescriptions on File Prior to Visit  Medication Sig Dispense Refill  . amLODipine (NORVASC) 10 MG tablet Take 1 tablet (10 mg total) by mouth daily. 30 tablet 0  . cloNIDine (CATAPRES) 0.1 MG tablet Take 1 tablet (0.1 mg total) by mouth 3 (three) times daily. 180 tablet 3  . Diclofenac Sodium 2 % SOLN Apply 1 pump  twice daily. 112 g 3  . irbesartan (AVAPRO) 300 MG tablet Take 1 tablet (300 mg total) by mouth daily. 30 tablet 0  . lansoprazole (PREVACID) 30 MG capsule Take 1 capsule (30 mg total) by mouth 2 (two) times daily before a meal. 60 capsule 4  . meclizine (ANTIVERT) 25 MG tablet Take 1 tablet (25 mg total) by mouth 4 (four) times daily as needed for dizziness. 120 tablet 0  . metoprolol succinate (TOPROL-XL) 100 MG 24 hr tablet Take 1 tablet (100 mg total) by mouth daily. Take with or immediately following a meal. 90 tablet 1   No current facility-administered medications on file prior to visit.    Review of Systems  Constitutional: Negative for fever and chills.  HENT: Positive for trouble swallowing.   Respiratory: Negative for cough, chest tightness and shortness of breath.   Cardiovascular: Negative for chest pain, palpitations and leg swelling.  Gastrointestinal: Positive for diarrhea.      Objective:    BP 126/80 mmHg  Pulse 97  Temp(Src) 98.8 F (37.1 C) (Oral)  Resp 14  Ht 5\' 4"  (1.626 m)  Wt 207 lb (93.895 kg)  BMI 35.51 kg/m2  SpO2 98% Nursing note and vital signs reviewed.  Physical Exam  Constitutional: She is oriented to person, place, and time. She appears well-developed and well-nourished. No distress.  HENT:  Right Ear: Hearing, tympanic membrane, external ear and ear canal normal.  Left Ear: Hearing, tympanic membrane, external ear and ear canal normal.  Nose: Nose normal.  Mouth/Throat: Uvula is midline, oropharynx is clear and moist and mucous membranes are normal.  Neck:  Small effusion noted just superior to left sternoclavicular joint.   Cardiovascular: Normal rate, regular rhythm, normal heart sounds and intact distal pulses.   Pulmonary/Chest: Effort normal and breath sounds normal.  Abdominal: She exhibits no distension and no mass. There is no tenderness. There is no rebound and no guarding.  Lymphadenopathy:    She has no cervical adenopathy.    Neurological: She is alert and oriented to person, place, and time.  Skin: Skin is warm and dry.  Psychiatric: She has a normal mood and affect. Her behavior is normal. Judgment and thought content normal.       Assessment & Plan:   Problem List Items Addressed This Visit      Digestive   Gastroesophageal reflux disease with esophagitis - Primary    Symptoms and exam consistent with GERD and esophogitis with additional symptoms odonphagia that feels like a foreign object. Continue current dosage of lansoprazole. Start sucralfate. Referred to GI with next appointment not available for several weeks. Advised to seek further care if symptoms do not improve or worsen.       Relevant Medications   sucralfate (CARAFATE) 1 g tablet

## 2015-04-17 NOTE — Telephone Encounter (Signed)
Patient Name: Nancy Acevedo  DOB: 09-11-1960    Initial Comment Caller states she was seen earlier this week with throat issues. It is getting worse and she cannot keep anything down now. Difficulty swallowing and feels her throat is closing up.    Nurse Assessment  Nurse: Mallie Mussel, RN, Alveta Heimlich Date/Time Eilene Ghazi Time): 04/17/2015 2:20:15 PM  Confirm and document reason for call. If symptomatic, describe symptoms. You must click the next button to save text entered. ---Caller states that she was seen earlier in the week for throat issues. Now her throat feels worse and it feels like its closing up on her. She states that it feels like something is stuck in her throat. She rates her pain as 9 on 0-10 scale. She had a fever earlier in the week. She denies difficulty breathing. She states that she can't keep anything down. She has had diarrhea and once she eats it comes out the other end.  Has the patient traveled out of the country within the last 30 days? ---No  Does the patient have any new or worsening symptoms? ---Yes  Will a triage be completed? ---Yes  Related visit to physician within the last 2 weeks? ---Yes  Does the PT have any chronic conditions? (i.e. diabetes, asthma, etc.) ---Yes  List chronic conditions. ---HTN, Sleep Apnea  Is the patient pregnant or possibly pregnant? (Ask all females between the ages of 83-55) ---No  Is this a behavioral health or substance abuse call? ---No     Guidelines    Guideline Title Affirmed Question Affirmed Notes  Sore Throat SEVERE (e.g., excruciating) throat pain    Final Disposition User   See Physician within [place], RN, Alveta Heimlich    Comments  Dr. Quay Burow did not have any appointments available. I was able to schedule a 3:30pm appointment with Dr. Mauricio Po for today.   Referrals  REFERRED TO PCP OFFICE   Disagree/Comply: Comply

## 2015-04-17 NOTE — Assessment & Plan Note (Signed)
Symptoms and exam consistent with GERD and esophogitis with additional symptoms odonphagia that feels like a foreign object. Continue current dosage of lansoprazole. Start sucralfate. Referred to GI with next appointment not available for several weeks. Advised to seek further care if symptoms do not improve or worsen.

## 2015-04-20 ENCOUNTER — Other Ambulatory Visit: Payer: Self-pay | Admitting: Internal Medicine

## 2015-04-20 LAB — STOOL CULTURE

## 2015-04-20 MED ORDER — FLUCONAZOLE 100 MG PO TABS: 100.0000 mg | ORAL_TABLET | Freq: Every day | ORAL | Status: DC

## 2015-04-21 LAB — GIARDIA/CRYPTOSPORIDIUM (EIA)

## 2015-04-22 ENCOUNTER — Ambulatory Visit (INDEPENDENT_AMBULATORY_CARE_PROVIDER_SITE_OTHER): Payer: BC Managed Care – PPO

## 2015-04-22 DIAGNOSIS — E538 Deficiency of other specified B group vitamins: Secondary | ICD-10-CM | POA: Diagnosis not present

## 2015-04-22 MED ORDER — CYANOCOBALAMIN 1000 MCG/ML IJ SOLN
1000.0000 ug | Freq: Once | INTRAMUSCULAR | Status: AC
  Administered 2015-04-22: 1000 ug via INTRAMUSCULAR

## 2015-05-13 LAB — FUNGUS CULTURE W SMEAR

## 2015-05-22 ENCOUNTER — Ambulatory Visit (INDEPENDENT_AMBULATORY_CARE_PROVIDER_SITE_OTHER): Payer: BC Managed Care – PPO | Admitting: Geriatric Medicine

## 2015-05-22 DIAGNOSIS — E538 Deficiency of other specified B group vitamins: Secondary | ICD-10-CM

## 2015-05-22 MED ORDER — CYANOCOBALAMIN 1000 MCG/ML IJ SOLN
1000.0000 ug | Freq: Once | INTRAMUSCULAR | Status: AC
  Administered 2015-05-22: 1000 ug via INTRAMUSCULAR

## 2015-05-26 ENCOUNTER — Encounter: Payer: Self-pay | Admitting: Pulmonary Disease

## 2015-05-26 ENCOUNTER — Ambulatory Visit (INDEPENDENT_AMBULATORY_CARE_PROVIDER_SITE_OTHER): Payer: BC Managed Care – PPO | Admitting: Pulmonary Disease

## 2015-05-26 VITALS — BP 164/96 | HR 76 | Ht 64.0 in | Wt 212.6 lb

## 2015-05-26 DIAGNOSIS — G4733 Obstructive sleep apnea (adult) (pediatric): Secondary | ICD-10-CM

## 2015-05-26 DIAGNOSIS — Z9989 Dependence on other enabling machines and devices: Principal | ICD-10-CM

## 2015-05-26 NOTE — Patient Instructions (Signed)
Follow up in 1 year.

## 2015-05-26 NOTE — Progress Notes (Signed)
Current Outpatient Prescriptions on File Prior to Visit  Medication Sig  . amLODipine (NORVASC) 10 MG tablet Take 1 tablet (10 mg total) by mouth daily.  . cloNIDine (CATAPRES) 0.1 MG tablet Take 1 tablet (0.1 mg total) by mouth 3 (three) times daily.  . Diclofenac Sodium 2 % SOLN Apply 1 pump twice daily.  . metoprolol succinate (TOPROL-XL) 100 MG 24 hr tablet Take 1 tablet (100 mg total) by mouth daily. Take with or immediately following a meal.   No current facility-administered medications on file prior to visit.     Chief Complaint  Patient presents with  . Follow-up    Wear CPAP nightly. Pressure setting seems to set adequately. Needs new hosing and headgear. Gets supplies online, not through [place]. Feels that nasal pillows are leaking d/t weak headgear.      Tests HST 03/28/12 >> AHI 40.8, SpO2 low 62% ONO with CPAP and RA 05/21/12 >> test time 4 hrs 18 min. Mean SpO2 94%, low SpO2 90%. Auto CPAP 04/26/15 to 05/25/15 >> used on 23 of 30 nights with average 6 hrs 28 min.  Average AHI 1.1 with median CPAP 7 and 90 th percentile CPAP 9 cm H2O  Past medical hx HTN, HA, DM, Gout, GERD  Past surgical hx, Allergies, Family hx, Social hx all reviewed.  Vital Signs BP 164/96 mmHg  Pulse 76  Ht 5\' 4"  (1.626 m)  Wt 212 lb 9.6 oz (96.435 kg)  BMI 36.47 kg/m2  SpO2 100%  History of Present Illness Nancy Acevedo is a 55 y.o. female with OSA.  She had to buy her mask on line >> too expensive going through insurance.  She is doing well with CPAP.  She has nasal pillows mask.  Her boyfriend says he still hears noises while she is asleep and using CPAP >> not sure if this is exhaled air from machine versus snoring.  Physical Exam  General - No distress ENT - No sinus tenderness, no oral exudate, no LAN, MP 3, enlarged tongue Cardiac - s1s2 regular, no murmur Chest - No wheeze/rales/dullness Back - No focal tenderness Abd - Soft, non-tender Ext - No edema Neuro - Normal  strength Skin - No rashes Psych - normal mood, and behavior   Assessment/Plan  Obstructive sleep apnea. - continue auto CPAP - asked her to clarify with her boyfriend whether she is snoring while asleep - she will check on line about how much mask supplies cost, and call if she would prefer to have supplies provided through her DME   Patient Instructions  Follow up in 1 year     Chesley Mires, MD [place] Pager:  [phone] 05/26/2015, 12:40 PM

## 2015-06-17 ENCOUNTER — Other Ambulatory Visit (INDEPENDENT_AMBULATORY_CARE_PROVIDER_SITE_OTHER): Payer: BC Managed Care – PPO

## 2015-06-17 ENCOUNTER — Ambulatory Visit (INDEPENDENT_AMBULATORY_CARE_PROVIDER_SITE_OTHER): Payer: BC Managed Care – PPO | Admitting: Gastroenterology

## 2015-06-17 ENCOUNTER — Encounter: Payer: Self-pay | Admitting: Gastroenterology

## 2015-06-17 VITALS — BP 150/100 | HR 72 | Ht 64.0 in | Wt 206.0 lb

## 2015-06-17 DIAGNOSIS — R197 Diarrhea, unspecified: Secondary | ICD-10-CM | POA: Diagnosis not present

## 2015-06-17 LAB — COMPREHENSIVE METABOLIC PANEL
ALK PHOS: 70 U/L (ref 39–117)
ALT: 12 U/L (ref 0–35)
AST: 26 U/L (ref 0–37)
Albumin: 4.2 g/dL (ref 3.5–5.2)
BILIRUBIN TOTAL: 0.5 mg/dL (ref 0.2–1.2)
BUN: 11 mg/dL (ref 6–23)
CO2: 28 meq/L (ref 19–32)
Calcium: 9.3 mg/dL (ref 8.4–10.5)
Chloride: 105 mEq/L (ref 96–112)
Creatinine, Ser: 0.98 mg/dL (ref 0.40–1.20)
GFR: 76.18 mL/min (ref >60.00)
GLUCOSE: 90 mg/dL (ref 70–99)
Potassium: 3.6 mEq/L (ref 3.5–5.1)
Sodium: 140 mEq/L (ref 135–145)
TOTAL PROTEIN: 8 g/dL (ref 6.0–8.3)

## 2015-06-17 LAB — CBC WITH DIFFERENTIAL/PLATELET
BASOS PCT: 0 % (ref 0.0–3.0)
EOS PCT: 0 % (ref 0.0–5.0)
HCT: 25.8 % — ABNORMAL LOW (ref 36.0–46.0)
Hemoglobin: 8.2 g/dL — ABNORMAL LOW (ref 12.0–15.0)
LYMPHS PCT: 31 % (ref 12.0–46.0)
MCHC: 31.9 g/dL (ref 30.0–36.0)
MCV: 78.5 fl (ref 78.0–100.0)
Monocytes Relative: 11 % (ref 3.0–12.0)
NEUTROS PCT: 58 % (ref 43.0–77.0)
PLATELETS: 286 10*3/uL (ref 150.0–400.0)
RBC: 3.29 Mil/uL — AB (ref 3.87–5.11)
RDW: 18 % — ABNORMAL HIGH (ref 11.5–15.5)
WBC: 5.5 10*3/uL (ref 4.0–10.5)

## 2015-06-17 MED ORDER — METRONIDAZOLE 500 MG PO TABS: 500.0000 mg | ORAL_TABLET | Freq: Three times a day (TID) | ORAL | Status: DC

## 2015-06-17 NOTE — Progress Notes (Signed)
Review of pertinent gastrointestinal problems: 1. Routine risk for colon cancer:   Colonoscopy May 2016 Dr. Owens Loffler for routine screening. No polyps were found. She did have left-sided diverticulosis. She was recommended to have repeat colon cancer screening colonoscopy in 10 years. 2. Acute dysphasia, odontophagia October 2016, abnormal esophagram. EGD 10/2014 The esophageal mucosa was abnormal appearing throughout but mainly in the proximal 1/2 of the esophagus. There were numerous linear and irregularly patterned furrows throughout. No obvious candida infection or ulcers. The esophagus was biopsied (distally and proximally) and the mucosa seemed to be thick, firm. The examination was otherwise normal.  Biopsies suggested reflux related injury, no sign of fungus, EoE.  Symptoms improved with BID PPI  HPI: [name].  Chief complaint is diarrhea  Stomach cramping a lot.  Having a lot of diarrhea, especially after eating.  Has been going on for several months.  Stools move only after eating.  Pure diarrhea.  No bleeding.  A lot of cramping.  She was on antibiotics 2-3 months ago for UTI.    She saw her primary care physician about the diarrhea a few weeks after it started. Stool was tested for Clostridium difficile, ova parasites, routine culture and these were all negative. She was recommended to try Imodium.   Past Medical History  Diagnosis Date  . Hypertension   . Headaches, cluster   . History of hyperglycemia 1993  . Diabetes mellitus, type 2 ([place]) dx 01/2011  . OSA (obstructive sleep apnea)   . EP (ectopic pregnancy)   . Candidiasis of mouth   . Sleep apnea     cpap use   . Gout   . Syncope 02/09/15  . Complication of anesthesia     " DIFFICULTY WAKING UP "  . Syncope 02/10/2015  . GERD (gastroesophageal reflux disease)     Past Surgical History  Procedure Laterality Date  . Tonsillectomy and  adenoidectomy  2012  . Ectopic pregnancy surgery      left fallopian tube  . Myringotomy with tube placement    . Tubal ligation    . Tonsillectomy    . Appendectomy    . Cardiac catheterization N/A 02/11/2015    Procedure: Left Heart Cath and Coronary Angiography;  Surgeon: Troy Sine, MD;  Location: [facility];  Service: Cardiovascular;  Laterality: N/A;    Current Outpatient Prescriptions  Medication Sig Dispense Refill  . amLODipine (NORVASC) 10 MG tablet Take 1 tablet (10 mg total) by mouth daily. 30 tablet 0  . cloNIDine (CATAPRES) 0.1 MG tablet Take 1 tablet (0.1 mg total) by mouth 3 (three) times daily. 180 tablet 3  . Diclofenac Sodium 2 % SOLN Apply 1 pump twice daily. 112 g 3  . metoprolol succinate (TOPROL-XL) 100 MG 24 hr tablet Take 1 tablet (100 mg total) by mouth daily. Take with or immediately following a meal. 90 tablet 1   No current facility-administered medications for this visit.    Allergies as of 06/17/2015 - Review Complete 05/26/2015  Allergen Reaction Noted  . Lexapro [escitalopram] Itching 05/06/2013  . Spironolactone  11/20/2012    Family History  Problem Relation Age of Onset  . Arthritis Sister   . Diabetes Mother   . Hypertension Mother   . Colon cancer Neg Hx   . Rectal cancer Neg Hx   . Stomach cancer Neg Hx     Social  History   Social History  . Marital Status: Single    Spouse Name: N/A  . Number of Children: 2  . Years of Education: N/A   Occupational History  . tech    Social History Main Topics  . Smoking status: Never Smoker   . Smokeless tobacco: Never Used  . Alcohol Use: No  . Drug Use: No  . Sexual Activity: Not on file   Other Topics Concern  . Not on file   Social History Narrative   Administration at [employer]     Physical Exam: There were no vitals taken for this visit. Constitutional: generally well-appearing Psychiatric: alert and oriented x3 Abdomen: soft, mildly tender throughout  nondistended, no obvious ascites, no peritoneal signs, normal bowel sounds   Assessment and plan: 55 y.o. female with Diarrhea, abdominal pain cramping  Her symptoms started shortly after antibiotics for urinary tract infection. This really seems like Clostridium difficile even though stool testing done 2 months ago did not show C. difficile. I'm going to retest her with routine stool cultures, C. difficile by toxin and PCR, as well as CBC and complete metabolic profile. Going to start her empirically on Flagyl 500 3 times a day for 2 weeks. She knows to call here in 3 days if she is not starting to feel better. If that is the case and she will likely need further testing possibly CT scan, repeat colonoscopy.   Owens Loffler, MD [facility] 06/17/2015, 3:10 PM

## 2015-06-17 NOTE — Patient Instructions (Signed)
You will have labs checked today in the basement lab.  Please head down after you check out with the front desk  (stool for C. Diff by PCR and toxin, stool for routine culture. Cbc, cmet). Will empirically start flagyl 500mg , three times per day for 14 days. Call in 3-4 days if you are worse and not starting to improve.

## 2015-06-18 ENCOUNTER — Other Ambulatory Visit: Payer: Self-pay | Admitting: Gastroenterology

## 2015-06-18 ENCOUNTER — Other Ambulatory Visit: Payer: Self-pay

## 2015-06-18 ENCOUNTER — Other Ambulatory Visit: Payer: BC Managed Care – PPO

## 2015-06-18 DIAGNOSIS — D649 Anemia, unspecified: Secondary | ICD-10-CM

## 2015-06-18 DIAGNOSIS — R197 Diarrhea, unspecified: Secondary | ICD-10-CM

## 2015-06-19 LAB — CLOSTRIDIUM DIFFICILE BY PCR: Toxigenic C. Difficile by PCR: NOT DETECTED

## 2015-06-22 ENCOUNTER — Ambulatory Visit (INDEPENDENT_AMBULATORY_CARE_PROVIDER_SITE_OTHER): Payer: BC Managed Care – PPO

## 2015-06-22 ENCOUNTER — Other Ambulatory Visit (INDEPENDENT_AMBULATORY_CARE_PROVIDER_SITE_OTHER): Payer: BC Managed Care – PPO

## 2015-06-22 ENCOUNTER — Ambulatory Visit: Payer: BC Managed Care – PPO

## 2015-06-22 DIAGNOSIS — E538 Deficiency of other specified B group vitamins: Secondary | ICD-10-CM

## 2015-06-22 DIAGNOSIS — D649 Anemia, unspecified: Secondary | ICD-10-CM | POA: Diagnosis not present

## 2015-06-22 LAB — CBC WITH DIFFERENTIAL/PLATELET
BASOS ABS: 0 10*3/uL (ref 0.0–0.1)
Basophils Relative: 0 % (ref 0.0–3.0)
EOS ABS: 0.1 10*3/uL (ref 0.0–0.7)
Eosinophils Relative: 1.4 % (ref 0.0–5.0)
HCT: 26.6 % — ABNORMAL LOW (ref 36.0–46.0)
Hemoglobin: 8.6 g/dL — ABNORMAL LOW (ref 12.0–15.0)
LYMPHS ABS: 1.5 10*3/uL (ref 0.7–4.0)
LYMPHS PCT: 38.9 % (ref 12.0–46.0)
MCHC: 32.2 g/dL (ref 30.0–36.0)
MCV: 79.2 fl (ref 78.0–100.0)
MONO ABS: 0.5 10*3/uL (ref 0.1–1.0)
Monocytes Relative: 13.9 % — ABNORMAL HIGH (ref 3.0–12.0)
NEUTROS PCT: 45.8 % (ref 43.0–77.0)
Neutro Abs: 1.7 10*3/uL (ref 1.4–7.7)
Platelets: 283 10*3/uL (ref 150.0–400.0)
RBC: 3.36 Mil/uL — AB (ref 3.87–5.11)
RDW: 18.9 % — ABNORMAL HIGH (ref 11.5–15.5)
WBC: 3.8 10*3/uL — ABNORMAL LOW (ref 4.0–10.5)

## 2015-06-22 LAB — IGA: IGA: 233 mg/dL (ref 68–378)

## 2015-06-22 LAB — STOOL CULTURE

## 2015-06-22 LAB — HIGH SENSITIVITY CRP: CRP, High Sensitivity: 7.83 mg/L — ABNORMAL HIGH (ref 0.000–5.000)

## 2015-06-22 LAB — SEDIMENTATION RATE: Sed Rate: 91 mm/hr — ABNORMAL HIGH (ref 0–30)

## 2015-06-23 DIAGNOSIS — E538 Deficiency of other specified B group vitamins: Secondary | ICD-10-CM | POA: Diagnosis not present

## 2015-06-23 LAB — TISSUE TRANSGLUTAMINASE, IGA: Tissue Transglutaminase Ab, IgA: 1 U/mL (ref <4)

## 2015-06-23 MED ORDER — CYANOCOBALAMIN 1000 MCG/ML IJ SOLN
1000.0000 ug | Freq: Once | INTRAMUSCULAR | Status: AC
  Administered 2015-06-23: 1000 ug via INTRAMUSCULAR

## 2015-06-25 ENCOUNTER — Other Ambulatory Visit: Payer: Self-pay

## 2015-06-25 DIAGNOSIS — R197 Diarrhea, unspecified: Secondary | ICD-10-CM

## 2015-06-25 DIAGNOSIS — R7982 Elevated C-reactive protein (CRP): Secondary | ICD-10-CM

## 2015-06-26 ENCOUNTER — Other Ambulatory Visit: Payer: BC Managed Care – PPO

## 2015-06-29 ENCOUNTER — Encounter: Payer: Self-pay | Admitting: Nurse Practitioner

## 2015-06-29 ENCOUNTER — Ambulatory Visit (INDEPENDENT_AMBULATORY_CARE_PROVIDER_SITE_OTHER)
Admission: RE | Admit: 2015-06-29 | Discharge: 2015-06-29 | Disposition: A | Discharge status: Home / Self Care | Payer: BC Managed Care – PPO | Source: Ambulatory Visit | Attending: Gastroenterology | Admitting: Gastroenterology

## 2015-06-29 DIAGNOSIS — R7982 Elevated C-reactive protein (CRP): Secondary | ICD-10-CM

## 2015-06-29 DIAGNOSIS — R197 Diarrhea, unspecified: Secondary | ICD-10-CM | POA: Diagnosis not present

## 2015-06-29 MED ORDER — IOPAMIDOL (ISOVUE-300) INJECTION 61%
100.0000 mL | Freq: Once | INTRAVENOUS | Status: AC | PRN
  Administered 2015-06-29: 100 mL via INTRAVENOUS

## 2015-06-30 ENCOUNTER — Telehealth: Payer: Self-pay | Admitting: Gastroenterology

## 2015-06-30 ENCOUNTER — Other Ambulatory Visit (INDEPENDENT_AMBULATORY_CARE_PROVIDER_SITE_OTHER): Payer: BC Managed Care – PPO

## 2015-06-30 ENCOUNTER — Telehealth: Payer: Self-pay | Admitting: Emergency Medicine

## 2015-06-30 DIAGNOSIS — R197 Diarrhea, unspecified: Secondary | ICD-10-CM

## 2015-06-30 NOTE — Telephone Encounter (Signed)
Lab called to have IFOB lab put in. Entered

## 2015-06-30 NOTE — Telephone Encounter (Signed)
Please call the patient. Her colon looks abnormal. Probably this is from inflammation since colonoscopy 1-2 years ago showed no sign of cancer, but she needs repeat colonoscopy for more clear diagnosis. LEC (diarrhea, abd pains)

## 2015-07-01 ENCOUNTER — Encounter: Payer: Self-pay | Admitting: Internal Medicine

## 2015-07-01 LAB — FECAL OCCULT BLOOD, IMMUNOCHEMICAL: FECAL OCCULT BLD: NEGATIVE

## 2015-07-01 NOTE — Telephone Encounter (Signed)
Pt has been scheduled for previsit and colon she is aware of the appt dates and times and will call back with any questions

## 2015-07-24 ENCOUNTER — Ambulatory Visit (AMBULATORY_SURGERY_CENTER): Payer: Self-pay | Admitting: *Deleted

## 2015-07-24 VITALS — Ht 64.0 in | Wt 204.4 lb

## 2015-07-24 DIAGNOSIS — R197 Diarrhea, unspecified: Secondary | ICD-10-CM

## 2015-07-24 DIAGNOSIS — R1010 Upper abdominal pain, unspecified: Secondary | ICD-10-CM

## 2015-07-24 MED ORDER — SUPREP BOWEL PREP KIT 17.5-3.13-1.6 GM/177ML PO SOLN: 1.0000 | Freq: Once | ORAL | Status: DC

## 2015-07-24 NOTE — Progress Notes (Signed)
Patient denies any allergies to egg or soy products. Patient denies complications with anesthesia/sedation.  Patient denies oxygen use at home and denies diet medications. Emmi instructions for colonoscopy explained but patient denied.     

## 2015-07-29 ENCOUNTER — Telehealth: Payer: Self-pay | Admitting: Emergency Medicine

## 2015-07-29 NOTE — Telephone Encounter (Signed)
Spoke with pt to inform. She is to continue current meds until appt. Changes will be made then if needed.

## 2015-07-29 NOTE — Telephone Encounter (Signed)
Please advise, her appt is 7/21

## 2015-07-29 NOTE — Telephone Encounter (Signed)
Not sure why she wants this.  It is a combo BP medication with three medications in one pills - not all of which she is on.  Most likely it will be very expensive - or have a high copay - we can discuss at her visit, but she may want to call her insurance before the visit to see how much it will cost her

## 2015-07-29 NOTE — Telephone Encounter (Signed)
Pt called and needs a prescription for Tribenzor. It is not on her recent med list so she was told to make an appointment. Thanks.

## 2015-07-31 ENCOUNTER — Encounter: Payer: Self-pay | Admitting: Gastroenterology

## 2015-07-31 ENCOUNTER — Ambulatory Visit (AMBULATORY_SURGERY_CENTER): Payer: BC Managed Care – PPO | Admitting: Gastroenterology

## 2015-07-31 VITALS — BP 204/110 | HR 84 | Temp 98.7°F | Resp 19 | Ht 64.0 in | Wt 204.0 lb

## 2015-07-31 DIAGNOSIS — R1084 Generalized abdominal pain: Secondary | ICD-10-CM | POA: Diagnosis not present

## 2015-07-31 DIAGNOSIS — R197 Diarrhea, unspecified: Secondary | ICD-10-CM | POA: Diagnosis not present

## 2015-07-31 DIAGNOSIS — K633 Ulcer of intestine: Secondary | ICD-10-CM

## 2015-07-31 MED ORDER — SODIUM CHLORIDE 0.9 % IV SOLN: 500.0000 mL | INTRAVENOUS | Status: DC

## 2015-07-31 MED ORDER — FLUCONAZOLE 100 MG PO TABS: 100.0000 mg | ORAL_TABLET | Freq: Every day | ORAL | Status: DC

## 2015-07-31 NOTE — Progress Notes (Signed)
A and O x3. Report to RN. Tolerated MAC anesthesia well. 

## 2015-07-31 NOTE — Op Note (Signed)
[facility] Patient Name: Nancy Acevedo Procedure Date: 07/31/2015 2:56 PM MRN: [id number] Endoscopist: Milus Banister , MD Age: 55 Referring MD:  Date of Birth: 10-27-1960 Gender: Female Account #: [id number] Procedure:                Colonoscopy Indications:              Chronic diarrhea, Abnormal CT of the GI tract Medicines:                Monitored Anesthesia Care Procedure:                Pre-Anesthesia Assessment:                           - Prior to the procedure, a History and Physical                            was performed, and patient medications and                            allergies were reviewed. The patient's tolerance of                            previous anesthesia was also reviewed. The risks                            and benefits of the procedure and the sedation                            options and risks were discussed with the patient.                            All questions were answered, and informed consent                            was obtained. Prior Anticoagulants: The patient has                            taken no previous anticoagulant or antiplatelet                            agents. ASA Grade Assessment: II - A patient with                            mild systemic disease. After reviewing the risks                            and benefits, the patient was deemed in                            satisfactory condition to undergo the procedure.                           After obtaining informed consent, the colonoscope  was passed under direct vision. Throughout the                            procedure, the patient's blood pressure, pulse, and                            oxygen saturations were monitored continuously. The                            Model CF-HQ190L [id number]) scope was introduced                            through the anus and advanced to the the terminal                            ileum. The  colonoscopy was performed without                            difficulty. The patient tolerated the procedure                            well. The quality of the bowel preparation was                            good. The terminal ileum, ileocecal valve,                            appendiceal orifice, and rectum were photographed. Scope In: 3:23:19 PM Scope Out: 3:48:29 PM Scope Withdrawal Time: 0 hours 20 minutes 21 seconds  Total Procedure Duration: 0 hours 25 minutes 10 seconds  Findings:                 There was a large ulcer at the IC valve, most                            distal aspect of the terminal ileum. The mucosa of                            the TI in this region was anormal (edematous,                            ?neoplastic) circumferentially for a short segment                            however the ileum just proximal appeared completely                            normal. Multiple biopsies taken                           There were severe puctate, deep ulcers in the right                            colon (5-8) all  about 38mm across. Several of these                            were sampled with biopsy. I attempted to label                            (with submucosal injection of SPOT) the distal                            aspect of the disease in case this is neoplast. Two                            injections were done in ascending colon and then I                            found another colon ulcer in proximal transverse                            and so I labeled that as well.                           The exam was otherwise without abnormality on                            direct and retroflexion views. Complications:            No immediate complications. Estimated blood loss:                            None. Estimated Blood Loss:     Estimated blood loss: none. Impression:               - Ulcerated, somewhat mass like lesion in very                            terminal  ileum (biopsied)                           - Multiple ulcers in the ascending colon and                            another in proximal transverse. Biopsied and                            labeled.                           - The examination was otherwise normal on direct                            and retroflexion views.                           - Unclear etiology here: IBD, neoplasm, unusual  infection all seem possible. Await pathology                            results. Recommendation:           - Patient has a contact number available for                            emergencies. The signs and symptoms of potential                            delayed complications were discussed with the                            patient. Return to normal activities tomorrow.                            Written discharge instructions were provided to the                            patient.                           - Resume previous diet.                           - Continue present medications.                           - Await pathology results.                           - Please start the diflucan that was called into                            your pharmacy today. Milus Banister, MD 07/31/2015 3:59:15 PM This report has been signed electronically.

## 2015-07-31 NOTE — Progress Notes (Signed)
Called to room to assist during endoscopic procedure.  Patient ID and intended procedure confirmed with present staff. Received instructions for my participation in the procedure from the performing physician.  

## 2015-07-31 NOTE — Patient Instructions (Addendum)
YOU HAD AN ENDOSCOPIC PROCEDURE TODAY AT [facility]:   Refer to the procedure report that was given to you for any specific questions about what was found during the examination.  If the procedure report does not answer your questions, please call your gastroenterologist to clarify.  If you requested that your care partner not be given the details of your procedure findings, then the procedure report has been included in a sealed envelope for you to review at your convenience later.  YOU SHOULD EXPECT: Some feelings of bloating in the abdomen. Passage of more gas than usual.  Walking can help get rid of the air that was put into your GI tract during the procedure and reduce the bloating. If you had a lower endoscopy (such as a colonoscopy or flexible sigmoidoscopy) you may notice spotting of blood in your stool or on the toilet paper. If you underwent a bowel prep for your procedure, you may not have a normal bowel movement for a few days.  Please Note:  You might notice some irritation and congestion in your nose or some drainage.  This is from the oxygen used during your procedure.  There is no need for concern and it should clear up in a day or so.  SYMPTOMS TO REPORT IMMEDIATELY:   Following lower endoscopy (colonoscopy or flexible sigmoidoscopy):  Excessive amounts of blood in the stool  Significant tenderness or worsening of abdominal pains  Swelling of the abdomen that is new, acute  Fever of 100F or higher  For urgent or emergent issues, a gastroenterologist can be reached at any hour by calling [phone].   DIET: Your first meal following the procedure should be a small meal and then it is ok to progress to your normal diet. Heavy or fried foods are harder to digest and may make you feel nauseous or bloated.  Likewise, meals heavy in dairy and vegetables can increase bloating.  Drink plenty of fluids but you should avoid alcoholic beverages for 24  hours.  ACTIVITY:  You should plan to take it easy for the rest of today and you should NOT DRIVE or use heavy machinery until tomorrow (because of the sedation medicines used during the test).    FOLLOW UP: Our staff will call the number listed on your records the next business day following your procedure to check on you and address any questions or concerns that you may have regarding the information given to you following your procedure. If we do not reach you, we will leave a message.  However, if you are feeling well and you are not experiencing any problems, there is no need to return our call.  We will assume that you have returned to your regular daily activities without incident.  If any biopsies were taken you will be contacted by phone or by letter within the next 1-3 weeks.  Please call us at [phone] if you have not heard about the biopsies in 3 weeks.    SIGNATURES/CONFIDENTIALITY: You and/or your care partner have signed paperwork which will be entered into your electronic medical record.  These signatures attest to the fact that that the information above on your After Visit Summary has been reviewed and is understood.  Full responsibility of the confidentiality of this discharge information lies with you and/or your care-partner.  Diflucan 100 mg daily

## 2015-08-03 ENCOUNTER — Telehealth: Payer: Self-pay | Admitting: Gastroenterology

## 2015-08-03 ENCOUNTER — Telehealth: Payer: Self-pay

## 2015-08-03 MED ORDER — PREDNISONE 20 MG PO TABS: 20.0000 mg | ORAL_TABLET | Freq: Two times a day (BID) | ORAL | Status: DC

## 2015-08-03 NOTE — Telephone Encounter (Signed)
  Follow up Call-  Call back number 07/31/2015 10/22/2014 06/09/2014  Post procedure Call Back phone  # [phone]  Permission to leave phone message Yes Yes Yes    Patient was called for follow up phone call after his procedure on 07/31/2015. No answer at the number given for follow up phone call. A message was left on the answering machine.

## 2015-08-03 NOTE — Telephone Encounter (Signed)
I spoke with pathology, Dr. Saralyn Pilar. He says this is most consistent with IBD, crohn's.  No sign of neoplasm or unusual infections.   Nancy Acevedo, Please call her. Let her know this is probably Crohn's disease.  Please call in prednisone 20mg  twice daily, disp one month, 2 refills.  rov in 4 weeks with myself or extender, double book if needed.  She needs to call in one week to let us know how she's doing.  Thanks

## 2015-08-05 ENCOUNTER — Other Ambulatory Visit: Payer: Self-pay | Admitting: *Deleted

## 2015-08-05 NOTE — Telephone Encounter (Signed)
Received refill request for Tribenzor. See previous msg MD want her to continue current therapy until her appt 08/07/15. Faxed back to [facility] w/status...Johny Chess

## 2015-08-07 ENCOUNTER — Encounter: Payer: Self-pay | Admitting: Emergency Medicine

## 2015-08-07 ENCOUNTER — Other Ambulatory Visit (INDEPENDENT_AMBULATORY_CARE_PROVIDER_SITE_OTHER): Payer: BC Managed Care – PPO

## 2015-08-07 ENCOUNTER — Ambulatory Visit (INDEPENDENT_AMBULATORY_CARE_PROVIDER_SITE_OTHER): Payer: BC Managed Care – PPO | Admitting: Internal Medicine

## 2015-08-07 ENCOUNTER — Telehealth: Payer: Self-pay | Admitting: Gastroenterology

## 2015-08-07 ENCOUNTER — Encounter: Payer: Self-pay | Admitting: Internal Medicine

## 2015-08-07 VITALS — BP 232/138 | HR 50 | Temp 98.2°F | Resp 16 | Wt 199.0 lb

## 2015-08-07 DIAGNOSIS — E119 Type 2 diabetes mellitus without complications: Secondary | ICD-10-CM

## 2015-08-07 DIAGNOSIS — B370 Candidal stomatitis: Secondary | ICD-10-CM | POA: Diagnosis not present

## 2015-08-07 DIAGNOSIS — E538 Deficiency of other specified B group vitamins: Secondary | ICD-10-CM | POA: Diagnosis not present

## 2015-08-07 DIAGNOSIS — I10 Essential (primary) hypertension: Secondary | ICD-10-CM

## 2015-08-07 DIAGNOSIS — K5010 Crohn's disease of large intestine without complications: Secondary | ICD-10-CM

## 2015-08-07 LAB — COMPREHENSIVE METABOLIC PANEL
ALK PHOS: 58 U/L (ref 39–117)
ALT: 16 U/L (ref 0–35)
AST: 18 U/L (ref 0–37)
Albumin: 4.1 g/dL (ref 3.5–5.2)
BUN: 26 mg/dL — ABNORMAL HIGH (ref 6–23)
CHLORIDE: 107 meq/L (ref 96–112)
CO2: 28 mEq/L (ref 19–32)
Calcium: 9.6 mg/dL (ref 8.4–10.5)
Creatinine, Ser: 1.24 mg/dL — ABNORMAL HIGH (ref 0.40–1.20)
GFR: 58.03 mL/min — AB (ref >60.00)
GLUCOSE: 113 mg/dL — AB (ref 70–99)
POTASSIUM: 4 meq/L (ref 3.5–5.1)
SODIUM: 142 meq/L (ref 135–145)
TOTAL PROTEIN: 7.7 g/dL (ref 6.0–8.3)
Total Bilirubin: 0.4 mg/dL (ref 0.2–1.2)

## 2015-08-07 LAB — HEMOGLOBIN A1C: Hgb A1c MFr Bld: 5.4 % (ref 4.6–6.5)

## 2015-08-07 MED ORDER — BLOOD GLUCOSE MONITOR KIT: PACK | Status: DC

## 2015-08-07 MED ORDER — CYANOCOBALAMIN 1000 MCG/ML IJ SOLN
1000.0000 ug | INTRAMUSCULAR | Status: AC
  Administered 2015-08-07: 1000 ug via INTRAMUSCULAR

## 2015-08-07 MED ORDER — LOSARTAN POTASSIUM 50 MG PO TABS: 50.0000 mg | ORAL_TABLET | Freq: Every day | ORAL | Status: DC

## 2015-08-07 NOTE — Telephone Encounter (Signed)
Pt states her BP is 232/137 today saw Dr Quay Burow today and was advised to call GI and get further recommendations.  Still having mid abdominal pain.  She is taking two 20 mg prednisone daily, she is having severe night sweats and dry mouth.  Appt 09/04/15 with Dr Ardis Hughs.  She is compliant with BP meds.

## 2015-08-07 NOTE — Assessment & Plan Note (Addendum)
Not controlled  - possibly related to prednisone Start losartan 50 mg daily Continue amlodipine 10 mg, clonidine 0.1 mg three times daily and metoprolol Monitor at home  Will increase losartan if BP is not controlled cmp

## 2015-08-07 NOTE — Progress Notes (Signed)
Pre visit review using our clinic review tool, if applicable. No additional management support is needed unless otherwise documented below in the visit note. 

## 2015-08-07 NOTE — Assessment & Plan Note (Signed)
Increased urination Likely sugars are elevated due to prednisone New glucometer prescribed - start monitoring Check a1c Will likely need to go on a medication until she is off prednisone - will start medication next week after a1c is back and she has checked her sugar

## 2015-08-07 NOTE — Telephone Encounter (Signed)
I don't know that I can advise her on her BP.  She has severe ulcerations in her ileum, right colon and the prednisone is necessary and I would not change it or decrease it just yet.  I'm going to forward this back to Dr. Quay Burow for HTN advice, it seems very high, she may need an additional medicine temporarily.  Dr. Quay Burow, See the above.   Thanks

## 2015-08-07 NOTE — Progress Notes (Signed)
Subjective:    Patient ID: Nancy Acevedo, female    DOB: 30-Dec-1960, 55 y.o.   MRN: [id number]  HPI She is here for follow up.  Crohn's colitis, thrush:  She recently had a colonoscopy and was diagnosed with Crohn's disease and thrush.  She was prescribed prednisone and diflucan and has a follow up with GI. Prior to being diagnosed she had months of diarrhea and abdominal cramping.  There was no blood in her stool.   She denies side effects from the prednisone.  The diarrhea is better, but she still has some diarrhea.  She has abdominal cramping.   Diabetes: She is controlling her sugars with lifestyle.  She has been on prednisone for the past week for Crohn's.  She has had increased urination over the past week.  She is compliant with a diabetic diet. She is walking a little for exercise.  She checks her feet daily and denies foot lesions. She has not been checking her sugars at home.   Hypertension: She is taking her medication daily. She is compliant with a low sodium diet.  She denies chest pain, palpitations, edema, shortness of breath. She is doing some walking.  She does monitor her blood pressure at home - her BP has been high at home - higher since being on the steroids.    Medications and allergies reviewed with patient and updated if appropriate.  Patient Active Problem List   Diagnosis Date Noted  . Crohn's colitis ([place]) 08/07/2015  . B12 deficiency 04/15/2015  . Gastroesophageal reflux disease with esophagitis 04/15/2015  . BPPV (benign paroxysmal positional vertigo) 02/19/2015  . Abnormal nuclear stress test   . Faintness   . Chest pain 02/09/2015  . Hypokalemia 02/09/2015  . Syncope 02/09/2015  . Greater trochanteric bursitis of left hip 01/15/2015  . Left hip pain 12/01/2014  . Head or neck swelling, mass, or lump 12/01/2014  . Dysphagia 10/14/2014  . Odynophagia 10/14/2014  . Abnormal esophagram 10/14/2014  . Oral candidiasis 10/14/2014  . Pain in joint,  ankle and foot 10/21/2013  . Muscle spasm of back 02/06/2013  . Nonallopathic lesion of thoracic region 02/06/2013  . Rash, drug 11/20/2012  . OSA (obstructive sleep apnea)   . Depression   . Obese   . Abnormal LFTs (liver function tests) 02/17/2011  . Diabetes mellitus, type 2 ([place]) 12/08/2010  . Hypertension   . Headaches, cluster     Current Outpatient Prescriptions on File Prior to Visit  Medication Sig Dispense Refill  . amLODipine (NORVASC) 10 MG tablet Take 1 tablet (10 mg total) by mouth daily. 30 tablet 0  . aspirin (GOODSENSE ASPIRIN) 81 MG chewable tablet Chew 81 mg by mouth.    . cloNIDine (CATAPRES) 0.1 MG tablet Take 1 tablet (0.1 mg total) by mouth 3 (three) times daily. 180 tablet 3  . fluconazole (DIFLUCAN) 100 MG tablet Take 1 tablet (100 mg total) by mouth daily. 10 tablet 1  . meloxicam (MOBIC) 7.5 MG tablet Take 7.5 mg by mouth.    . metoprolol succinate (TOPROL-XL) 100 MG 24 hr tablet Take 1 tablet (100 mg total) by mouth daily. Take with or immediately following a meal. 90 tablet 1  . predniSONE (DELTASONE) 20 MG tablet Take 1 tablet (20 mg total) by mouth 2 (two) times daily with a meal. 60 tablet 2   No current facility-administered medications on file prior to visit.    Past Medical History  Diagnosis Date  . Hypertension   .  Headaches, cluster     otc med prn  . History of hyperglycemia 1993  . OSA (obstructive sleep apnea)     uses CPAP nightly  . EP (ectopic pregnancy)   . Candidiasis of mouth     History   . Gout     hx - no current problem  . Syncope 02/09/15  . Complication of anesthesia     " SLOW WAKING UP "  . Syncope 02/10/2015  . Diabetes mellitus, type 2 ([place]) dx 01/2011    borderline - no current medications, diet controlled  . Sleep apnea     Uses CPAP every night  . GERD (gastroesophageal reflux disease)     no meds, diet controlled    Past Surgical History  Procedure Laterality Date  . Tonsillectomy and adenoidectomy  2012    . Ectopic pregnancy surgery      left fallopian tube  . Myringotomy with tube placement    . Tubal ligation    . Tonsillectomy    . Cardiac catheterization N/A 02/11/2015    Procedure: Left Heart Cath and Coronary Angiography;  Surgeon: Troy Sine, MD;  Location: [facility];  Service: Cardiovascular;  Laterality: N/A;- Normal results  . Appendectomy    . Colonoscopy  2016    Ardis Hughs - Normal    Social History   Social History  . Marital Status: Single    Spouse Name: N/A  . Number of Children: 2  . Years of Education: N/A   Occupational History  . tech    Social History Main Topics  . Smoking status: Never Smoker   . Smokeless tobacco: Never Used  . Alcohol Use: No  . Drug Use: No  . Sexual Activity: Yes    Birth Control/ Protection: Post-menopausal   Other Topics Concern  . None   Social History Narrative   Administration at [employer]    Family History  Problem Relation Age of Onset  . Arthritis Sister   . Diabetes Mother   . Hypertension Mother   . Colon cancer Neg Hx   . Rectal cancer Neg Hx   . Stomach cancer Neg Hx   . Esophageal cancer Neg Hx     Review of Systems  Constitutional: Negative for fever.  Eyes: Negative for visual disturbance.  Respiratory: Negative for cough, shortness of breath and wheezing.   Cardiovascular: Positive for palpitations (with clonidine). Negative for chest pain and leg swelling.  Gastrointestinal: Positive for abdominal pain (cramping) and diarrhea.  Endocrine: Positive for polyuria.  Neurological: Positive for dizziness, light-headedness and headaches.       Objective:   Filed Vitals:   08/07/15 0945  BP: 232/138  Pulse: 50  Temp: 98.2 F (36.8 C)  Resp: 16   Filed Weights   08/07/15 0945  Weight: 199 lb (90.266 kg)   Body mass index is 34.14 kg/(m^2).   Physical Exam  Constitutional: She appears well-developed and well-nourished. No distress.  Eyes: Conjunctivae are normal.  Neck: No  tracheal deviation present. No thyromegaly present.  Cardiovascular: Normal rate, regular rhythm and normal heart sounds.   No murmur heard. Pulmonary/Chest: Effort normal and breath sounds normal. No respiratory distress. She has no wheezes. She has no rales.  Abdominal: Soft. She exhibits no distension. There is no tenderness.  Musculoskeletal: She exhibits no edema.  Lymphadenopathy:    She has no cervical adenopathy.  Skin: Skin is warm and dry. She is not diaphoretic.  Assessment & Plan:   See Problem List for Assessment and Plan of chronic medical problems.

## 2015-08-07 NOTE — Assessment & Plan Note (Signed)
Taking prednisone  - has GI follow up Diarrhea improved, still having abdominal cramping, but improved

## 2015-08-07 NOTE — Assessment & Plan Note (Signed)
Started on diflucan by GI Symptoms better

## 2015-08-07 NOTE — Patient Instructions (Addendum)
  Test(s) ordered today. Your results will be released to [place] (or called to you) after review, usually within 72hours after test completion. If any changes need to be made, you will be notified at that same time.  We will start a diabetes medication depending on your blood work.  Medications reviewed and updated.  Changes include starting losartan for your blood pressure.  Start monitoring your BP at home and let me know what your numbers are next week so we can adjust your medication.    Your prescription(s) have been submitted to your pharmacy. Please take as directed and contact our office if you believe you are having problem(s) with the medication(s).   Please followup in 2 months

## 2015-08-07 NOTE — Assessment & Plan Note (Signed)
B12 injection today and monthly 

## 2015-08-07 NOTE — Telephone Encounter (Signed)
Pt has been advised and will call Dr Quay Burow office now for further recommendations for HTN.

## 2015-08-08 NOTE — Telephone Encounter (Signed)
Call patient - she was not advised to call GI regarding BP - just to update them on her GI symptoms only.  See how her BP is on the new medication - will adjust if needed.  Let her know her a1c is still well controlled - see how her sugars are.  Her blood shows some dehydration - she needs to increase her fluids.

## 2015-08-10 NOTE — Telephone Encounter (Signed)
Increase losartan to 100mg daily

## 2015-08-10 NOTE — Telephone Encounter (Signed)
LVM informing pt of medication change

## 2015-08-10 NOTE — Telephone Encounter (Signed)
Spoke with pt to inform. Her BP was 230/170 this morning. Pt states she is taking medication right on time every day. Please advise on changes,

## 2015-08-29 ENCOUNTER — Emergency Department (HOSPITAL_BASED_OUTPATIENT_CLINIC_OR_DEPARTMENT_OTHER)
Admission: EM | Admit: 2015-08-29 | Discharge: 2015-08-29 | Disposition: A | Discharge status: Home / Self Care | Payer: BC Managed Care – PPO | Attending: Emergency Medicine | Admitting: Emergency Medicine

## 2015-08-29 ENCOUNTER — Encounter (HOSPITAL_BASED_OUTPATIENT_CLINIC_OR_DEPARTMENT_OTHER): Payer: Self-pay | Admitting: *Deleted

## 2015-08-29 DIAGNOSIS — K047 Periapical abscess without sinus: Secondary | ICD-10-CM | POA: Insufficient documentation

## 2015-08-29 DIAGNOSIS — R21 Rash and other nonspecific skin eruption: Secondary | ICD-10-CM | POA: Diagnosis not present

## 2015-08-29 DIAGNOSIS — K0889 Other specified disorders of teeth and supporting structures: Secondary | ICD-10-CM | POA: Diagnosis present

## 2015-08-29 DIAGNOSIS — E119 Type 2 diabetes mellitus without complications: Secondary | ICD-10-CM | POA: Diagnosis not present

## 2015-08-29 DIAGNOSIS — I10 Essential (primary) hypertension: Secondary | ICD-10-CM | POA: Insufficient documentation

## 2015-08-29 MED ORDER — TRIAMCINOLONE ACETONIDE 0.1 % EX CREA: 1.0000 "application " | TOPICAL_CREAM | Freq: Four times a day (QID) | CUTANEOUS | 0 refills | Status: DC | PRN

## 2015-08-29 MED ORDER — PENICILLIN V POTASSIUM 500 MG PO TABS: 1000.0000 mg | ORAL_TABLET | Freq: Two times a day (BID) | ORAL | 0 refills | Status: DC

## 2015-08-29 MED ORDER — IBUPROFEN 400 MG PO TABS
600.0000 mg | ORAL_TABLET | Freq: Once | ORAL | Status: AC
  Administered 2015-08-29: 600 mg via ORAL
  Filled 2015-08-29: qty 1

## 2015-08-29 NOTE — ED Triage Notes (Signed)
Patient c/o rash on buttocks that has grown worse over the past two weeks. She also c/o R side upper jaw pain. The has an appoint for later this month to have a tooth pulled that she believes is infected

## 2015-08-29 NOTE — ED Provider Notes (Addendum)
[facility] Provider Note   CSN: [id number] Arrival date & time: 08/29/15  9024  First Provider Contact:  First MD Initiated Contact with Patient 08/29/15 [phone]        History   Chief Complaint Chief Complaint  Patient presents with  . Rash    HPI Nancy Acevedo is a 55 y.o. female presenting with 2 chief complaints. Chief complaint appears to be right upper dental pain. Has been having for a little over 1 week. Went to her dentist 3 days ago but could not afford the extraction. She will be able to afford it later this month when she gets pain. Has also been having right facial swelling since then. No fevers. No trouble swallowing, only painful to eat. No difficulty breathing. Also notes a rash in between her buttocks. She states this is been present for about 2 weeks. She gets this rash about once a month and then seemed to go away with topical cream. However it is been getting worse and more painful and burning. Some itching. Has tried Vaseline in the past with no relief. Has been trying a powder but it is not helping like typical.  HPI  Past Medical History:  Diagnosis Date  . Candidiasis of mouth    History   . Complication of anesthesia    " SLOW WAKING UP "  . Diabetes mellitus, type 2 ([place]) dx 01/2011   borderline - no current medications, diet controlled  . EP (ectopic pregnancy)   . GERD (gastroesophageal reflux disease)    no meds, diet controlled  . Gout    hx - no current problem  . Headaches, cluster    otc med prn  . History of hyperglycemia 1993  . Hypertension   . OSA (obstructive sleep apnea)    uses CPAP nightly  . Sleep apnea    Uses CPAP every night  . Syncope 02/09/15  . Syncope 02/10/2015    Patient Active Problem List   Diagnosis Date Noted  . Crohn's colitis ([place]) 08/07/2015  . B12 deficiency 04/15/2015  . Gastroesophageal reflux disease with esophagitis 04/15/2015  . BPPV (benign paroxysmal positional vertigo) 02/19/2015  .  Abnormal nuclear stress test   . Faintness   . Chest pain 02/09/2015  . Hypokalemia 02/09/2015  . Syncope 02/09/2015  . Greater trochanteric bursitis of left hip 01/15/2015  . Left hip pain 12/01/2014  . Head or neck swelling, mass, or lump 12/01/2014  . Dysphagia 10/14/2014  . Odynophagia 10/14/2014  . Abnormal esophagram 10/14/2014  . Oral candidiasis 10/14/2014  . Pain in joint, ankle and foot 10/21/2013  . Muscle spasm of back 02/06/2013  . Nonallopathic lesion of thoracic region 02/06/2013  . Rash, drug 11/20/2012  . OSA (obstructive sleep apnea)   . Depression   . Obese   . Abnormal LFTs (liver function tests) 02/17/2011  . Diabetes mellitus, type 2 ([place]) 12/08/2010  . Hypertension   . Headaches, cluster     Past Surgical History:  Procedure Laterality Date  . APPENDECTOMY    . CARDIAC CATHETERIZATION N/A 02/11/2015   Procedure: Left Heart Cath and Coronary Angiography;  Surgeon: Troy Sine, MD;  Location: [facility];  Service: Cardiovascular;  Laterality: N/A;- Normal results  . COLONOSCOPY  2016   Ardis Hughs - Normal  . ECTOPIC PREGNANCY SURGERY     left fallopian tube  . MYRINGOTOMY WITH TUBE PLACEMENT    . TONSILLECTOMY    . TONSILLECTOMY AND ADENOIDECTOMY  2012  .  TUBAL LIGATION      OB History    No data available       Home Medications    Prior to Admission medications   Medication Sig Start Date End Date Taking? Authorizing Provider  amLODipine (NORVASC) 10 MG tablet Take 1 tablet (10 mg total) by mouth daily. 02/10/15   Nita Sells, MD  aspirin (GOODSENSE ASPIRIN) 81 MG chewable tablet Chew 81 mg by mouth. 10/18/14   Historical Provider, MD  blood glucose meter kit and supplies KIT Dispense based on patient and insurance preference. Use up to four times daily as directed. (FOR ICD-9 250.00, 250.01). 08/07/15   Binnie Rail, MD  cloNIDine (CATAPRES) 0.1 MG tablet Take 1 tablet (0.1 mg total) by mouth 3 (three) times daily. 12/04/14    Binnie Rail, MD  fluconazole (DIFLUCAN) 100 MG tablet Take 1 tablet (100 mg total) by mouth daily. 07/31/15   Milus Banister, MD  losartan (COZAAR) 50 MG tablet Take 1 tablet (50 mg total) by mouth daily. 08/07/15   Binnie Rail, MD  meloxicam (MOBIC) 7.5 MG tablet Take 7.5 mg by mouth. 07/24/15   Historical Provider, MD  metoprolol succinate (TOPROL-XL) 100 MG 24 hr tablet Take 1 tablet (100 mg total) by mouth daily. Take with or immediately following a meal. 12/01/14   Binnie Rail, MD  penicillin v potassium (VEETID) 500 MG tablet Take 2 tablets (1,000 mg total) by mouth 2 (two) times daily. X 7 days 08/29/15   Sherwood Gambler, MD  predniSONE (DELTASONE) 20 MG tablet Take 1 tablet (20 mg total) by mouth 2 (two) times daily with a meal. 08/03/15   Milus Banister, MD  triamcinolone cream (KENALOG) 0.1 % Apply 1 application topically 4 (four) times daily as needed. For 1 week 08/29/15   Sherwood Gambler, MD    Family History Family History  Problem Relation Age of Onset  . Arthritis Sister   . Diabetes Mother   . Hypertension Mother   . Colon cancer Neg Hx   . Rectal cancer Neg Hx   . Stomach cancer Neg Hx   . Esophageal cancer Neg Hx     Social History Social History  Substance Use Topics  . Smoking status: Never Smoker  . Smokeless tobacco: Never Used  . Alcohol use No     Allergies   Lexapro [escitalopram]; Losartan; and Spironolactone   Review of Systems Review of Systems  Constitutional: Negative for fever.  HENT: Positive for dental problem and facial swelling. Negative for trouble swallowing.   Respiratory: Negative for shortness of breath.   Skin: Positive for rash.  All other systems reviewed and are negative.    Physical Exam Updated Vital Signs BP (!) 180/103 (BP Location: Right Arm)   Pulse 77   Temp 100.1 F (37.8 C) (Oral)   Resp 20   Ht '5\' 4"'$  (1.626 m)   Wt 198 lb (89.8 kg)   SpO2 100%   BMI 33.99 kg/m   Physical Exam  Constitutional: She is  oriented to person, place, and time. She appears well-developed and well-nourished. No distress.  HENT:  Head: Normocephalic and atraumatic.  Right Ear: External ear normal.  Left Ear: External ear normal.  Nose: Nose normal.  Mouth/Throat: Uvula is midline and oropharynx is clear and moist. No trismus in the jaw. No uvula swelling. No tonsillar abscesses.  Right maxillary posterior molar with some point tenderness but no focal swelling. Some tenderness and surrounding gingiva and  cheek. External appearance shows very slight swelling of her right cheek compared to left. No erythema.   Eyes: Right eye exhibits no discharge. Left eye exhibits no discharge.  Cardiovascular: Normal rate, regular rhythm and normal heart sounds.   Pulmonary/Chest: Effort normal and breath sounds normal.  Abdominal: Soft. There is no tenderness.  Neurological: She is alert and oriented to person, place, and time.  Skin: Skin is warm and dry. She is not diaphoretic.     Nursing note and vitals reviewed.    ED Treatments / Results  Labs (all labs ordered are listed, but only abnormal results are displayed) Labs Reviewed - No data to display  EKG  EKG Interpretation None       Radiology No results found.  Procedures Procedures (including critical care time)  Medications Ordered in ED Medications  ibuprofen (ADVIL,MOTRIN) tablet 600 mg (not administered)     Initial Impression / Assessment and Plan / ED Course  I have reviewed the triage vital signs and the nursing notes.  Pertinent labs & imaging results that were available during my care of the patient were reviewed by me and considered in my medical decision making (see chart for details).  Clinical Course    Patient states she's been having pretty regular bowel movements and does not have to strain much. Does look like this "rash" could be from bowel movements and irritation. Will try steroid cream, although I have told her to talk to  her gastroenterologist given her recent diagnosis of Crohn's. As for her tooth she does have some mild swelling and likely an infection with a low-grade fever here. However she is overall well appearing with no signs of deep space infection or airway concerns. Will start on antibiotics, recommend ibuprofen as well. Discussed the importance of following up closely with her dentist as well. Discussed return precautions. She has an elevated BP but no symptoms. F/u with PCP for this.  Final Clinical Impressions(s) / ED Diagnoses   Final diagnoses:  Dental infection  Rash and nonspecific skin eruption    New Prescriptions New Prescriptions   PENICILLIN V POTASSIUM (VEETID) 500 MG TABLET    Take 2 tablets (1,000 mg total) by mouth 2 (two) times daily. X 7 days   TRIAMCINOLONE CREAM (KENALOG) 0.1 %    Apply 1 application topically 4 (four) times daily as needed. For 1 week     Sherwood Gambler, MD 08/29/15 [id number]    Sherwood Gambler, MD 08/29/15 [phone]

## 2015-09-04 ENCOUNTER — Other Ambulatory Visit: Payer: Self-pay | Admitting: Gastroenterology

## 2015-09-04 ENCOUNTER — Ambulatory Visit (INDEPENDENT_AMBULATORY_CARE_PROVIDER_SITE_OTHER): Payer: BC Managed Care – PPO | Admitting: Gastroenterology

## 2015-09-04 ENCOUNTER — Other Ambulatory Visit (INDEPENDENT_AMBULATORY_CARE_PROVIDER_SITE_OTHER): Payer: BC Managed Care – PPO

## 2015-09-04 ENCOUNTER — Encounter: Payer: Self-pay | Admitting: Gastroenterology

## 2015-09-04 VITALS — BP 138/80 | HR 80 | Ht 64.25 in | Wt 201.4 lb

## 2015-09-04 DIAGNOSIS — K50119 Crohn's disease of large intestine with unspecified complications: Secondary | ICD-10-CM

## 2015-09-04 LAB — CBC WITH DIFFERENTIAL/PLATELET
Basophils Absolute: 0 10*3/uL (ref 0.0–0.1)
Basophils Relative: 0 % (ref 0.0–3.0)
EOS ABS: 0 10*3/uL (ref 0.0–0.7)
EOS PCT: 0.4 % (ref 0.0–5.0)
HEMATOCRIT: 29.5 % — AB (ref 36.0–46.0)
HEMOGLOBIN: 9.8 g/dL — AB (ref 12.0–15.0)
LYMPHS PCT: 6.6 % — AB (ref 12.0–46.0)
Lymphs Abs: 0.3 10*3/uL — ABNORMAL LOW (ref 0.7–4.0)
MCHC: 33.1 g/dL (ref 30.0–36.0)
MCV: 85 fl (ref 78.0–100.0)
MONO ABS: 0.4 10*3/uL (ref 0.1–1.0)
Monocytes Relative: 7.7 % (ref 3.0–12.0)
Neutro Abs: 4.4 10*3/uL (ref 1.4–7.7)
Neutrophils Relative %: 85.3 % — ABNORMAL HIGH (ref 43.0–77.0)
Platelets: 344 10*3/uL (ref 150.0–400.0)
RBC: 3.48 Mil/uL — AB (ref 3.87–5.11)
RDW: 20.7 % — ABNORMAL HIGH (ref 11.5–15.5)
WBC: 5.1 10*3/uL (ref 4.0–10.5)

## 2015-09-04 LAB — COMPREHENSIVE METABOLIC PANEL
ALBUMIN: 3.5 g/dL (ref 3.5–5.2)
ALK PHOS: 53 U/L (ref 39–117)
ALT: 17 U/L (ref 0–35)
AST: 14 U/L (ref 0–37)
BILIRUBIN TOTAL: 0.4 mg/dL (ref 0.2–1.2)
BUN: 25 mg/dL — AB (ref 6–23)
CALCIUM: 8.9 mg/dL (ref 8.4–10.5)
CO2: 31 mEq/L (ref 19–32)
CREATININE: 1.11 mg/dL (ref 0.40–1.20)
Chloride: 107 mEq/L (ref 96–112)
GFR: 65.93 mL/min (ref >60.00)
Glucose, Bld: 109 mg/dL — ABNORMAL HIGH (ref 70–99)
Potassium: 3.2 mEq/L — ABNORMAL LOW (ref 3.5–5.1)
SODIUM: 144 meq/L (ref 135–145)
TOTAL PROTEIN: 6.3 g/dL (ref 6.0–8.3)

## 2015-09-04 MED ORDER — PREDNISONE 10 MG PO TABS: ORAL_TABLET | ORAL | 2 refills | Status: DC

## 2015-09-04 MED ORDER — FLUCONAZOLE 100 MG PO TABS: 100.0000 mg | ORAL_TABLET | Freq: Every day | ORAL | 0 refills | Status: AC

## 2015-09-04 NOTE — Progress Notes (Signed)
Review of pertinent gastrointestinal problems: 1. Routine risk for colon cancer:   Colonoscopy May 2016 Dr. Owens Loffler for routine screening. No polyps were found. She did have left-sided diverticulosis. She was recommended to have repeat colon cancer screening colonoscopy in 10 years. 2. Acute dysphasia, odontophagia October 2016, abnormal esophagram. EGD 10/2014 The esophageal mucosa was abnormal appearing throughout but mainly in the proximal 1/2 of the esophagus. There were numerous linear and irregularly patterned furrows throughout. No obvious candida infection or ulcers. The esophagus was biopsied (distally and proximally) and the mucosa seemed to be thick, firm. The examination was otherwise normal.  Biopsies suggested reflux related injury, no sign of fungus, EoE.  Symptoms improved with BID PPI 3. Diarrhea, IBD?? (crohns ileocolitis): May 2017. ttg normal, total Iga normal; sed rate and CRP very elevated..  Stool studies for infection were all negative. Course of antibiotics did not help.  CT scan showed "masslike thickening of the cecum" She eventually underwent colonoscopy July 2017 Dr. Ardis Hughs. This found a large ulcer in the terminal ileum though somewhat masslike, also several punctate ulcers in the right colon. Multiple biopsies were taken from these sites and this showed only active inflammation no clear Crohn's disease.  I spoke with patholgy and they felt it was still possibly IBD.  Prednisone was started '20mg'$  twice daily.   HPI: This is a  very pleasant 55 year old woman whom I last saw about a month ago  Chief complaint is improved diarrhea, rash on buttocks, continued lower abdominal cramping  She was here in the office about 2-1/2 months ago with diarrhea, abdominal cramping. This was shortly after a treatment for urinary tract infection.  Eventually put on prednisone '20mg'$  twice daily. The diarrhea is completely gone.  Actually a bit constipated.    She has lower abd  pains.  Cramping all day.  Stress play   She used to take a lot of good powders (3 packs per day). This was in may, June but she stopped.  Lately taking alleve, often two per day (in a week she will do this twice).   Past Medical History:  Diagnosis Date  . Candidiasis of mouth    History   . Complication of anesthesia    " SLOW WAKING UP "  . Diabetes mellitus, type 2 ([place]) dx 01/2011   borderline - no current medications, diet controlled  . EP (ectopic pregnancy)   . GERD (gastroesophageal reflux disease)    no meds, diet controlled  . Gout    hx - no current problem  . Headaches, cluster    otc med prn  . History of hyperglycemia 1993  . Hypertension   . OSA (obstructive sleep apnea)    uses CPAP nightly  . Sleep apnea    Uses CPAP every night  . Syncope 02/09/15  . Syncope 02/10/2015    Past Surgical History:  Procedure Laterality Date  . APPENDECTOMY    . CARDIAC CATHETERIZATION N/A 02/11/2015   Procedure: Left Heart Cath and Coronary Angiography;  Surgeon: Troy Sine, MD;  Location: [facility];  Service: Cardiovascular;  Laterality: N/A;- Normal results  . COLONOSCOPY  2016   Ardis Hughs - Normal  . ECTOPIC PREGNANCY SURGERY     left fallopian tube  . MYRINGOTOMY WITH TUBE PLACEMENT    . TONSILLECTOMY    . TONSILLECTOMY AND ADENOIDECTOMY  2012  . TUBAL LIGATION      Current Outpatient Prescriptions  Medication Sig Dispense Refill  . amLODipine (  NORVASC) 10 MG tablet Take 1 tablet (10 mg total) by mouth daily. 30 tablet 0  . aspirin (GOODSENSE ASPIRIN) 81 MG chewable tablet Chew 81 mg by mouth.    . blood glucose meter kit and supplies KIT Dispense based on patient and insurance preference. Use up to four times daily as directed. (FOR ICD-9 250.00, 250.01). 1 each 0  . meloxicam (MOBIC) 7.5 MG tablet Take 7.5 mg by mouth as needed.     . metoprolol succinate (TOPROL-XL) 100 MG 24 hr tablet Take 1 tablet (100 mg total) by mouth daily. Take with or  immediately following a meal. 90 tablet 1  . penicillin v potassium (VEETID) 500 MG tablet Take 2 tablets (1,000 mg total) by mouth 2 (two) times daily. X 7 days 28 tablet 0  . predniSONE (DELTASONE) 20 MG tablet Take 1 tablet (20 mg total) by mouth 2 (two) times daily with a meal. 60 tablet 2   Current Facility-Administered Medications  Medication Dose Route Frequency Provider Last Rate Last Dose  . cyanocobalamin ((VITAMIN B-12)) injection 1,000 mcg  1,000 mcg Intramuscular Q30 days Binnie Rail, MD   1,000 mcg at 08/07/15 1030    Allergies as of 09/04/2015 - Review Complete 09/04/2015  Allergen Reaction Noted  . Lexapro [escitalopram] Itching 05/06/2013  . Losartan  08/29/2015  . Spironolactone  11/20/2012    Family History  Problem Relation Age of Onset  . Arthritis Sister   . Diabetes Mother   . Hypertension Mother   . Colon cancer Neg Hx   . Rectal cancer Neg Hx   . Stomach cancer Neg Hx   . Esophageal cancer Neg Hx     Social History   Social History  . Marital status: Single    Spouse name: N/A  . Number of children: 2  . Years of education: N/A   Occupational History  . tech Minor [employer]   Social History Main Topics  . Smoking status: Never Smoker  . Smokeless tobacco: Never Used  . Alcohol use No  . Drug use: No  . Sexual activity: Yes    Birth control/ protection: Post-menopausal   Other Topics Concern  . Not on file   Social History Narrative   Administration at [employer]     Physical Exam: BP 138/80   Pulse 80   Ht 5' 4.25" (1.632 m)   Wt 201 lb 6 oz (91.3 kg)   BMI 34.30 kg/m  Constitutional: generally well-appearing Psychiatric: alert and oriented x3 Abdomen: soft, nontender, nondistended, no obvious ascites, no peritoneal signs, normal bowel sounds Rectal exam: Clear fungal infection in the crease between her buttocks extending from her anus to about 8 cm posteriorly. This is a bit tender. No fluctuance to suggest underlying  abscess  Assessment and plan: 55 y.o. female with Terminal ileal inflammation right colon inflammation  First, I am still not convinced that she has underlying Crohn's, inflammatory bowel disease. The biopsies were not definitive and the endoscopic picture was a bit unusual for Crohn's. Today she tells me she was taking 3 Goody powders a day leading up to her diarrheal illness as well. 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 She is taking much less NSAIDs but still takes Aleve about twice per week. I instructed her to completely stop NSAIDs and to use Tylenol for her routine aches and pains. She has definitely responded to prednisone 20 mg twice daily in terms of her diarrhea. She is actually almost constipated now. Unfortunately  she has fungal infection in her buttocks. She tends to get these usually in her mouth. I'm calling in a treatment for this with Diflucan and I'm going to have her start tapering her prednisone by 5 mg per week starting today. I'm getting [facility] testing to see if that will help serologically determine or sway towards inflammatory bowel disease or not. She will return to see me in 4 weeks, double booking if needed. She knows to call sooner if she has any problems before then.   Owens Loffler, MD [facility] 09/04/2015, 9:46 AM

## 2015-09-04 NOTE — Patient Instructions (Addendum)
[facility] labs for IBD: You will have labs checked today in the basement lab.  Please head down after you check out with the front desk  (cbc, cmet). Diflucan 100mg  once daily for 10 days. Will change to 10mg  pills of prednisone, start taking 35mg  per day today, decrease by 5mg  per week. Please return to see Dr. Yates Decamp 09/29/15 at 2:45 pm. Stop Alleve, take only tylenol for pains.

## 2015-09-07 LAB — PROMETHEUS-MAIL

## 2015-09-11 ENCOUNTER — Telehealth: Payer: Self-pay | Admitting: Gastroenterology

## 2015-09-11 ENCOUNTER — Ambulatory Visit (INDEPENDENT_AMBULATORY_CARE_PROVIDER_SITE_OTHER): Payer: BC Managed Care – PPO | Admitting: *Deleted

## 2015-09-11 DIAGNOSIS — E538 Deficiency of other specified B group vitamins: Secondary | ICD-10-CM

## 2015-09-11 MED ORDER — CYANOCOBALAMIN 1000 MCG/ML IJ SOLN
1000.0000 ug | Freq: Once | INTRAMUSCULAR | Status: AC
  Administered 2015-09-11: 1000 ug via INTRAMUSCULAR

## 2015-09-11 NOTE — Telephone Encounter (Signed)
"  Pattern not consistent with IBD" lab result received.  Will put on Dr Ardis Hughs desk for review and recommendations.

## 2015-09-14 NOTE — Telephone Encounter (Signed)
Patient calling in regarding this. Best # [phone]

## 2015-09-15 ENCOUNTER — Other Ambulatory Visit: Payer: Self-pay

## 2015-09-15 DIAGNOSIS — E876 Hypokalemia: Secondary | ICD-10-CM

## 2015-09-15 NOTE — Telephone Encounter (Signed)
See results note from today 

## 2015-09-16 ENCOUNTER — Other Ambulatory Visit (INDEPENDENT_AMBULATORY_CARE_PROVIDER_SITE_OTHER): Payer: BC Managed Care – PPO

## 2015-09-16 DIAGNOSIS — E876 Hypokalemia: Secondary | ICD-10-CM | POA: Diagnosis not present

## 2015-09-16 LAB — BASIC METABOLIC PANEL
BUN: 23 mg/dL (ref 6–23)
CHLORIDE: 104 meq/L (ref 96–112)
CO2: 30 mEq/L (ref 19–32)
Calcium: 8.5 mg/dL (ref 8.4–10.5)
Creatinine, Ser: 1.22 mg/dL — ABNORMAL HIGH (ref 0.40–1.20)
GFR: 59.11 mL/min — ABNORMAL LOW (ref >60.00)
GLUCOSE: 195 mg/dL — AB (ref 70–99)
Potassium: 3.4 mEq/L — ABNORMAL LOW (ref 3.5–5.1)
SODIUM: 143 meq/L (ref 135–145)

## 2015-09-22 ENCOUNTER — Other Ambulatory Visit: Payer: Self-pay

## 2015-09-22 DIAGNOSIS — E876 Hypokalemia: Secondary | ICD-10-CM

## 2015-09-29 ENCOUNTER — Emergency Department (HOSPITAL_BASED_OUTPATIENT_CLINIC_OR_DEPARTMENT_OTHER)
Admission: EM | Admit: 2015-09-29 | Discharge: 2015-09-29 | Disposition: A | Discharge status: Home / Self Care | Payer: BC Managed Care – PPO | Attending: Emergency Medicine | Admitting: Emergency Medicine

## 2015-09-29 ENCOUNTER — Encounter (HOSPITAL_BASED_OUTPATIENT_CLINIC_OR_DEPARTMENT_OTHER): Payer: Self-pay | Admitting: *Deleted

## 2015-09-29 ENCOUNTER — Emergency Department (HOSPITAL_BASED_OUTPATIENT_CLINIC_OR_DEPARTMENT_OTHER): Payer: BC Managed Care – PPO

## 2015-09-29 ENCOUNTER — Ambulatory Visit: Payer: BC Managed Care – PPO | Admitting: Gastroenterology

## 2015-09-29 DIAGNOSIS — K529 Noninfective gastroenteritis and colitis, unspecified: Secondary | ICD-10-CM

## 2015-09-29 DIAGNOSIS — Z79899 Other long term (current) drug therapy: Secondary | ICD-10-CM | POA: Diagnosis not present

## 2015-09-29 DIAGNOSIS — I10 Essential (primary) hypertension: Secondary | ICD-10-CM | POA: Insufficient documentation

## 2015-09-29 DIAGNOSIS — E119 Type 2 diabetes mellitus without complications: Secondary | ICD-10-CM | POA: Insufficient documentation

## 2015-09-29 DIAGNOSIS — E876 Hypokalemia: Secondary | ICD-10-CM | POA: Diagnosis not present

## 2015-09-29 DIAGNOSIS — E860 Dehydration: Secondary | ICD-10-CM | POA: Diagnosis not present

## 2015-09-29 DIAGNOSIS — Z7982 Long term (current) use of aspirin: Secondary | ICD-10-CM | POA: Insufficient documentation

## 2015-09-29 DIAGNOSIS — R109 Unspecified abdominal pain: Secondary | ICD-10-CM | POA: Diagnosis present

## 2015-09-29 LAB — COMPREHENSIVE METABOLIC PANEL
ALT: 36 U/L (ref 14–54)
AST: 48 U/L — ABNORMAL HIGH (ref 15–41)
Albumin: 3.7 g/dL (ref 3.5–5.0)
Alkaline Phosphatase: 64 U/L (ref 38–126)
Anion gap: 8 (ref 5–15)
BUN: 18 mg/dL (ref 6–20)
CO2: 30 mmol/L (ref 22–32)
Calcium: 8.8 mg/dL — ABNORMAL LOW (ref 8.9–10.3)
Chloride: 101 mmol/L (ref 101–111)
Creatinine, Ser: 1.07 mg/dL — ABNORMAL HIGH (ref 0.44–1.00)
GFR calc Af Amer: >60 mL/min (ref >60)
GFR calc non Af Amer: 58 mL/min — ABNORMAL LOW (ref >60)
Glucose, Bld: 114 mg/dL — ABNORMAL HIGH (ref 65–99)
Potassium: 2.7 mmol/L — CL (ref 3.5–5.1)
Sodium: 139 mmol/L (ref 135–145)
Total Bilirubin: 0.8 mg/dL (ref 0.3–1.2)
Total Protein: 7.2 g/dL (ref 6.5–8.1)

## 2015-09-29 LAB — CBC WITH DIFFERENTIAL/PLATELET
Basophils Absolute: 0 10*3/uL (ref 0.0–0.1)
Basophils Relative: 0 %
Eosinophils Absolute: 0 10*3/uL (ref 0.0–0.7)
Eosinophils Relative: 1 %
HCT: 33.1 % — ABNORMAL LOW (ref 36.0–46.0)
Hemoglobin: 10.7 g/dL — ABNORMAL LOW (ref 12.0–15.0)
Lymphocytes Relative: 14 %
Lymphs Abs: 0.6 10*3/uL — ABNORMAL LOW (ref 0.7–4.0)
MCH: 28.3 pg (ref 26.0–34.0)
MCHC: 32.3 g/dL (ref 30.0–36.0)
MCV: 87.6 fL (ref 78.0–100.0)
Monocytes Absolute: 0.4 10*3/uL (ref 0.1–1.0)
Monocytes Relative: 10 %
Neutro Abs: 3.1 10*3/uL (ref 1.7–7.7)
Neutrophils Relative %: 75 %
Platelets: 272 10*3/uL (ref 150–400)
RBC: 3.78 MIL/uL — ABNORMAL LOW (ref 3.87–5.11)
RDW: 16.1 % — ABNORMAL HIGH (ref 11.5–15.5)
WBC: 4.1 10*3/uL (ref 4.0–10.5)

## 2015-09-29 LAB — URINALYSIS, ROUTINE W REFLEX MICROSCOPIC
Bilirubin Urine: NEGATIVE
Glucose, UA: NEGATIVE mg/dL
Hgb urine dipstick: NEGATIVE
Ketones, ur: NEGATIVE mg/dL
Leukocytes, UA: NEGATIVE
Nitrite: NEGATIVE
Protein, ur: NEGATIVE mg/dL
Specific Gravity, Urine: 1.009 (ref 1.005–1.030)
pH: 6.5 (ref 5.0–8.0)

## 2015-09-29 LAB — LIPASE, BLOOD: Lipase: 25 U/L (ref 11–51)

## 2015-09-29 MED ORDER — PROMETHAZINE HCL 25 MG/ML IJ SOLN
25.0000 mg | Freq: Once | INTRAMUSCULAR | Status: AC
  Administered 2015-09-29: 25 mg via INTRAVENOUS
  Filled 2015-09-29: qty 1

## 2015-09-29 MED ORDER — POTASSIUM CHLORIDE 10 MEQ/100ML IV SOLN
10.0000 meq | INTRAVENOUS | Status: AC
  Administered 2015-09-29 (×2): 10 meq via INTRAVENOUS
  Filled 2015-09-29 (×2): qty 100

## 2015-09-29 MED ORDER — PROMETHAZINE HCL 25 MG PO TABS: 25.0000 mg | ORAL_TABLET | Freq: Four times a day (QID) | ORAL | 0 refills | Status: DC | PRN

## 2015-09-29 MED ORDER — POTASSIUM CHLORIDE CRYS ER 20 MEQ PO TBCR
40.0000 meq | EXTENDED_RELEASE_TABLET | Freq: Once | ORAL | Status: AC
  Administered 2015-09-29: 40 meq via ORAL
  Filled 2015-09-29: qty 2

## 2015-09-29 MED ORDER — IOPAMIDOL (ISOVUE-300) INJECTION 61%
100.0000 mL | Freq: Once | INTRAVENOUS | Status: AC | PRN
  Administered 2015-09-29: 100 mL via INTRAVENOUS

## 2015-09-29 MED ORDER — SODIUM CHLORIDE 0.9 % IV BOLUS (SEPSIS)
1000.0000 mL | Freq: Once | INTRAVENOUS | Status: AC
  Administered 2015-09-29: 1000 mL via INTRAVENOUS

## 2015-09-29 MED ORDER — ONDANSETRON HCL 4 MG/2ML IJ SOLN
4.0000 mg | Freq: Once | INTRAMUSCULAR | Status: AC
Start: 2015-09-29 — End: 2015-09-29
  Administered 2015-09-29: 4 mg via INTRAVENOUS
  Filled 2015-09-29: qty 2

## 2015-09-29 MED FILL — PROMETHAZINE 25 MG TABLET: 25 | 2 days supply | Qty: 10 | Fill #0

## 2015-09-29 NOTE — ED Triage Notes (Signed)
Patient c/o N/V/D since Saturday.  Patient estimates she has vomited x3 each day since Saturday and she is unable to tolerate oral intake.  Patient states she had 10 episodes of diarrhea Saturday and Sunday, but not has occasional episodes of diarrhea.  Patient denies abdominal cramping/pain.  Patient denies blood in emesis or stool.  Patient has taken Pepto Bismol to treat with no relief.  Patient also c/o headache and has not taken anti-hypertensives since Saturday b/c of the N/V.  Patient denies neuro changes such as dizziness, changes in vision or focal weakness. Patient had a fever or 102 orally on Saturday, but her temperature over the past 2 days has been normal.   Patient had a tooth pulled week before last and was started on Amoxicillin.  She took that up until last Friday when she began to feel ill.  Patient works at [employer] and is exposed to many different people, but has no close sick contacts at home.

## 2015-09-29 NOTE — Discharge Instructions (Signed)
Return here as needed.  Slowly increase her fluid intake, rest as much as possible

## 2015-10-01 NOTE — ED Provider Notes (Signed)
[facility] DEPT Provider Note   CSN: [id number] Arrival date & time: 09/29/15  0857     History   Chief Complaint Chief Complaint  Patient presents with  . Emesis  . Diarrhea    HPI Nancy Acevedo is a 55 y.o. female.  HPI Patient presents to the emergency department with vomiting, diarrhea for the last 3 days.  Patient states that she has also had some abdominal discomfort associated with this.  Patient states she did not take any medications prior to arrival.  Patient states nothing seems make the condition better. The patient denies chest pain, shortness of breath, headache,blurred vision, neck pain, fever, cough, weakness, numbness, dizziness, anorexia, edema, rash, back pain, dysuria, hematemesis, bloody stool, near syncope, or syncope.  Patient, states she has had some abdominal complaints over the last few months  Past Medical History:  Diagnosis Date  . Candidiasis of mouth    History   . Complication of anesthesia    " SLOW WAKING UP "  . Diabetes mellitus, type 2 ([place]) dx 01/2011   borderline - no current medications, diet controlled  . EP (ectopic pregnancy)   . GERD (gastroesophageal reflux disease)    no meds, diet controlled  . Gout    hx - no current problem  . Headaches, cluster    otc med prn  . History of hyperglycemia 1993  . Hypertension   . OSA (obstructive sleep apnea)    uses CPAP nightly  . Sleep apnea    Uses CPAP every night  . Syncope 02/09/15  . Syncope 02/10/2015    Patient Active Problem List   Diagnosis Date Noted  . Crohn's colitis ([place]) 08/07/2015  . B12 deficiency 04/15/2015  . Gastroesophageal reflux disease with esophagitis 04/15/2015  . BPPV (benign paroxysmal positional vertigo) 02/19/2015  . Abnormal nuclear stress test   . Faintness   . Chest pain 02/09/2015  . Hypokalemia 02/09/2015  . Syncope 02/09/2015  . Greater trochanteric bursitis of left hip 01/15/2015  . Left hip pain 12/01/2014  . Head or neck swelling,  mass, or lump 12/01/2014  . Dysphagia 10/14/2014  . Odynophagia 10/14/2014  . Abnormal esophagram 10/14/2014  . Oral candidiasis 10/14/2014  . Pain in joint, ankle and foot 10/21/2013  . Muscle spasm of back 02/06/2013  . Nonallopathic lesion of thoracic region 02/06/2013  . Rash, drug 11/20/2012  . OSA (obstructive sleep apnea)   . Depression   . Obese   . Abnormal LFTs (liver function tests) 02/17/2011  . Diabetes mellitus, type 2 ([place]) 12/08/2010  . Hypertension   . Headaches, cluster     Past Surgical History:  Procedure Laterality Date  . APPENDECTOMY    . CARDIAC CATHETERIZATION N/A 02/11/2015   Procedure: Left Heart Cath and Coronary Angiography;  Surgeon: Troy Sine, MD;  Location: [facility];  Service: Cardiovascular;  Laterality: N/A;- Normal results  . COLONOSCOPY  2016   Ardis Hughs - Normal  . ECTOPIC PREGNANCY SURGERY     left fallopian tube  . MYRINGOTOMY WITH TUBE PLACEMENT    . TONSILLECTOMY    . TONSILLECTOMY AND ADENOIDECTOMY  2012  . TUBAL LIGATION      OB History    No data available       Home Medications    Prior to Admission medications   Medication Sig Start Date End Date Taking? Authorizing Provider  amLODipine (NORVASC) 10 MG tablet Take 1 tablet (10 mg total) by mouth daily. 02/10/15  Yes  Nita Sells, MD  aspirin (GOODSENSE ASPIRIN) 81 MG chewable tablet Chew 81 mg by mouth. 10/18/14  Yes Historical Provider, MD  blood glucose meter kit and supplies KIT Dispense based on patient and insurance preference. Use up to four times daily as directed. (FOR ICD-9 250.00, 250.01). 08/07/15  Yes Binnie Rail, MD  meloxicam (MOBIC) 7.5 MG tablet Take 7.5 mg by mouth as needed.  07/24/15  Yes Historical Provider, MD  metoprolol succinate (TOPROL-XL) 100 MG 24 hr tablet Take 1 tablet (100 mg total) by mouth daily. Take with or immediately following a meal. 12/01/14  Yes Binnie Rail, MD  predniSONE (DELTASONE) 10 MG tablet Take 35 mg 1 week  then decrease by 5 mg per week 09/04/15  Yes Milus Banister, MD  penicillin v potassium (VEETID) 500 MG tablet Take 2 tablets (1,000 mg total) by mouth 2 (two) times daily. X 7 days 08/29/15   Sherwood Gambler, MD  promethazine (PHENERGAN) 25 MG tablet Take 1 tablet (25 mg total) by mouth every 6 (six) hours as needed for nausea or vomiting. 09/29/15   Dalia Heading, PA-C    Family History Family History  Problem Relation Age of Onset  . Arthritis Sister   . Diabetes Mother   . Hypertension Mother   . Colon cancer Neg Hx   . Rectal cancer Neg Hx   . Stomach cancer Neg Hx   . Esophageal cancer Neg Hx     Social History Social History  Substance Use Topics  . Smoking status: Never Smoker  . Smokeless tobacco: Never Used  . Alcohol use No     Allergies   Lexapro [escitalopram]; Losartan; and Spironolactone   Review of Systems Review of Systems All other systems negative except as documented in the HPI. All pertinent positives and negatives as reviewed in the HPI.  Physical Exam Updated Vital Signs BP (!) 184/123 (BP Location: Left Arm)   Pulse 96   Temp 98 F (36.7 C) (Oral)   Resp 20   Ht _0  (1.626 m)   Wt 90.7 kg   SpO2 100%   BMI 34.33 kg/m   Physical Exam  Constitutional: She is oriented to person, place, and time. She appears well-developed and well-nourished. No distress.  HENT:  Head: Normocephalic and atraumatic.  Mouth/Throat: Oropharynx is clear and moist.  Eyes: Pupils are equal, round, and reactive to light.  Neck: Normal range of motion. Neck supple.  Cardiovascular: Normal rate, regular rhythm and normal heart sounds.  Exam reveals no gallop and no friction rub.   No murmur heard. Pulmonary/Chest: Effort normal and breath sounds normal. No respiratory distress. She has no wheezes.  Abdominal: Soft. Bowel sounds are normal. She exhibits no distension. There is tenderness. There is no guarding.  Neurological: She is alert and oriented to person,  place, and time. She exhibits normal muscle tone. Coordination normal.  Skin: Skin is warm and dry. No rash noted. No erythema.  Psychiatric: She has a normal mood and affect. Her behavior is normal.  Nursing note and vitals reviewed.    ED Treatments / Results  Labs (all labs ordered are listed, but only abnormal results are displayed) Labs Reviewed  COMPREHENSIVE METABOLIC PANEL - Abnormal; Notable for the following:       Result Value   Potassium 2.7 (*)    Glucose, Bld 114 (*)    Creatinine, Ser 1.07 (*)    Calcium 8.8 (*)    AST 48 (*)  GFR calc non Af Amer 58 (*)    All other components within normal limits  CBC WITH DIFFERENTIAL/PLATELET - Abnormal; Notable for the following:    RBC 3.78 (*)    Hemoglobin 10.7 (*)    HCT 33.1 (*)    RDW 16.1 (*)    Lymphs Abs 0.6 (*)    All other components within normal limits  LIPASE, BLOOD  URINALYSIS, ROUTINE W REFLEX MICROSCOPIC (NOT AT [facility]: [facility])    EKG  EKG Interpretation None       Radiology Ct Abdomen Pelvis W Contrast  Result Date: 09/29/2015 CLINICAL DATA:  Fever, nausea, vomiting EXAM: CT ABDOMEN AND PELVIS WITH CONTRAST TECHNIQUE: Multidetector CT imaging of the abdomen and pelvis was performed using the standard protocol following bolus administration of intravenous contrast. CONTRAST:  168m ISOVUE-300 IOPAMIDOL (ISOVUE-300) INJECTION 61% COMPARISON:  06/29/2015 FINDINGS: Lower chest: No acute abnormality. Hepatobiliary: No focal liver abnormality is seen. No gallstones, gallbladder wall thickening, or biliary dilatation. Pancreas: Unremarkable. No pancreatic ductal dilatation or surrounding inflammatory changes. Spleen: Normal in size without focal abnormality. Adrenals/Urinary Tract: Adrenal glands are unremarkable. Kidneys are normal, without renal calculi, focal lesion, or hydronephrosis. Bladder is unremarkable. Stomach/Bowel: No bowel wall thickening or dilatation. No pneumatosis, pneumoperitoneum or portal venous  gas. No abdominal or pelvic free fluid. Vascular/Lymphatic: Normal caliber abdominal aorta. No lymphadenopathy. Reproductive: Uterus and bilateral adnexa are unremarkable. Other: No fluid collection or hematoma. Musculoskeletal: No acute osseous abnormality. No lytic or sclerotic osseous lesion. Degenerative disc disease with disc height loss at L4-5 and L5-S1. Bilateral facet arthropathy at L4-5 and L5-S1. IMPRESSION: No acute abdominal or pelvic pathology. Electronically Signed   By: HKathreen Devoid  On: 09/29/2015 11:41    Procedures Procedures (including critical care time)  Medications Ordered in ED Medications  sodium chloride 0.9 % bolus 1,000 mL (0 mLs Intravenous Stopped 09/29/15 1122)  ondansetron (ZOFRAN) injection 4 mg (4 mg Intravenous Given 09/29/15 1016)  potassium chloride 10 mEq in 100 mL IVPB (0 mEq Intravenous Stopped 09/29/15 1433)  iopamidol (ISOVUE-300) 61 % injection 100 mL (100 mLs Intravenous Contrast Given 09/29/15 1120)  potassium chloride SA (K-DUR,KLOR-CON) CR tablet 40 mEq (40 mEq Oral Given 09/29/15 1440)  promethazine (PHENERGAN) injection 25 mg (25 mg Intravenous Given 09/29/15 1445)     Initial Impression / Assessment and Plan / ED Course  I have reviewed the triage vital signs and the nursing notes.  Pertinent labs & imaging results that were available during my care of the patient were reviewed by me and considered in my medical decision making (see chart for details).  Clinical Course    Patient is feeling improved following IV fluids and antiemetics.  Told to return here as needed.  Advised the patient that we will treat her for gastroenteritis.  Told to slowly increase her fluid intake and rest as much as possible, as the plan and all questions were answered  Final Clinical Impressions(s) / ED Diagnoses   Final diagnoses:  Dehydration  Hypokalemia  Gastroenteritis    New Prescriptions Discharge Medication List as of 09/29/2015  3:34 PM    START  taking these medications   Details  promethazine (PHENERGAN) 25 MG tablet Take 1 tablet (25 mg total) by mouth every 6 (six) hours as needed for nausea or vomiting., Starting Tue 09/29/2015, Print         CDalia Heading PA-C 10/01/15 0103    NDavonna Belling MD 10/07/15 [phone]

## 2015-10-02 ENCOUNTER — Other Ambulatory Visit: Payer: BC Managed Care – PPO

## 2015-10-02 ENCOUNTER — Ambulatory Visit (INDEPENDENT_AMBULATORY_CARE_PROVIDER_SITE_OTHER): Payer: BC Managed Care – PPO | Admitting: Internal Medicine

## 2015-10-02 ENCOUNTER — Other Ambulatory Visit (INDEPENDENT_AMBULATORY_CARE_PROVIDER_SITE_OTHER): Payer: BC Managed Care – PPO

## 2015-10-02 VITALS — BP 148/90 | HR 92 | Temp 98.3°F | Resp 16 | Wt 196.0 lb

## 2015-10-02 DIAGNOSIS — R197 Diarrhea, unspecified: Secondary | ICD-10-CM | POA: Diagnosis not present

## 2015-10-02 DIAGNOSIS — E119 Type 2 diabetes mellitus without complications: Secondary | ICD-10-CM | POA: Diagnosis not present

## 2015-10-02 DIAGNOSIS — I10 Essential (primary) hypertension: Secondary | ICD-10-CM

## 2015-10-02 DIAGNOSIS — E876 Hypokalemia: Secondary | ICD-10-CM | POA: Diagnosis not present

## 2015-10-02 LAB — COMPREHENSIVE METABOLIC PANEL
ALT: 20 U/L (ref 0–35)
AST: 25 U/L (ref 0–37)
Albumin: 3.8 g/dL (ref 3.5–5.2)
Alkaline Phosphatase: 65 U/L (ref 39–117)
BILIRUBIN TOTAL: 0.5 mg/dL (ref 0.2–1.2)
BUN: 14 mg/dL (ref 6–23)
CALCIUM: 8.7 mg/dL (ref 8.4–10.5)
CHLORIDE: 104 meq/L (ref 96–112)
CO2: 30 meq/L (ref 19–32)
Creatinine, Ser: 1.24 mg/dL — ABNORMAL HIGH (ref 0.40–1.20)
GFR: 58 mL/min — AB (ref >60.00)
Glucose, Bld: 87 mg/dL (ref 70–99)
POTASSIUM: 3.2 meq/L — AB (ref 3.5–5.1)
Sodium: 143 mEq/L (ref 135–145)
Total Protein: 7.1 g/dL (ref 6.0–8.3)

## 2015-10-02 LAB — CBC WITH DIFFERENTIAL/PLATELET
BASOS ABS: 0 10*3/uL (ref 0.0–0.1)
Basophils Relative: 0.1 % (ref 0.0–3.0)
EOS ABS: 0 10*3/uL (ref 0.0–0.7)
Eosinophils Relative: 0.2 % (ref 0.0–5.0)
HEMATOCRIT: 30.9 % — AB (ref 36.0–46.0)
Hemoglobin: 10.2 g/dL — ABNORMAL LOW (ref 12.0–15.0)
LYMPHS ABS: 0.8 10*3/uL (ref 0.7–4.0)
LYMPHS PCT: 19.4 % (ref 12.0–46.0)
MCHC: 33 g/dL (ref 30.0–36.0)
MCV: 85.7 fl (ref 78.0–100.0)
Monocytes Absolute: 0.3 10*3/uL (ref 0.1–1.0)
Monocytes Relative: 8.1 % (ref 3.0–12.0)
NEUTROS PCT: 72.2 % (ref 43.0–77.0)
Neutro Abs: 3 10*3/uL (ref 1.4–7.7)
PLATELETS: 199 10*3/uL (ref 150.0–400.0)
RBC: 3.6 Mil/uL — ABNORMAL LOW (ref 3.87–5.11)
RDW: 18.2 % — ABNORMAL HIGH (ref 11.5–15.5)
WBC: 4.2 10*3/uL (ref 4.0–10.5)

## 2015-10-02 LAB — BASIC METABOLIC PANEL
BUN: 13 mg/dL (ref 6–23)
CHLORIDE: 104 meq/L (ref 96–112)
CO2: 30 mEq/L (ref 19–32)
Calcium: 8.7 mg/dL (ref 8.4–10.5)
Creatinine, Ser: 1.27 mg/dL — ABNORMAL HIGH (ref 0.40–1.20)
GFR: 56.42 mL/min — ABNORMAL LOW (ref >60.00)
Glucose, Bld: 87 mg/dL (ref 70–99)
POTASSIUM: 3.2 meq/L — AB (ref 3.5–5.1)
Sodium: 142 mEq/L (ref 135–145)

## 2015-10-02 LAB — HEMOGLOBIN A1C: Hgb A1c MFr Bld: 6.6 % — ABNORMAL HIGH (ref 4.6–6.5)

## 2015-10-02 MED ORDER — ONDANSETRON 4 MG PO TBDP: 4.0000 mg | ORAL_TABLET | Freq: Three times a day (TID) | ORAL | 0 refills | Status: AC | PRN

## 2015-10-02 NOTE — Patient Instructions (Signed)
Call Dr Ardis Hughs on Monday.   Test(s) ordered today. Your results will be released to [facility] (or called to you) after review, usually within 72hours after test completion. If any changes need to be made, you will be notified at that same time.  All other Health Maintenance issues reviewed.   All recommended immunizations and age-appropriate screenings are up-to-date or discussed.  No immunizations administered today.   Medications reviewed and updated.  Changes include a different anti-nausea medication to try.   Your prescription(s) have been submitted to your pharmacy. Please take as directed and contact our office if you believe you are having problem(s) with the medication(s).   Please followup in 3 months

## 2015-10-02 NOTE — Progress Notes (Signed)
Subjective:    Patient ID: Charleston Vierling, female    DOB: [birth year], 55 y.o.   MRN: [id number]  HPI The patient is here for follow up.  She was in the ED a few days ago for gastroenteritis.  Her symptoms started three days prior.  She had a very low potassium and it was repleted.  She also had IVF.  She is still having diarrhea.  Today she has had 5-6 episodes of non-bloody diarrhea. She deneis abdomina pain, fever.  She has some nausea.  She is not eating or drinking much.  She was on penicillin recently for a dental infection.  Crohn's colitis:  She is still on the prednisone for the Crohn's.  This was well controlled until three days prior to going to the ED.  She had a follow up with GI earlier this week, but felt so bad that she cancelled the appointment.  Diabetes:  She has not been checking her sugars at home.  She is not on any medication.  She has had some blurry vision.    Hypertension: She is taking her medication daily. She is compliant with a low sodium diet.  She denies chest pain, palpitations, edema, shortness of breath.    Medications and allergies reviewed with patient and updated if appropriate.  Patient Active Problem List   Diagnosis Date Noted  . Crohn's colitis ([place]) 08/07/2015  . B12 deficiency 04/15/2015  . Gastroesophageal reflux disease with esophagitis 04/15/2015  . BPPV (benign paroxysmal positional vertigo) 02/19/2015  . Abnormal nuclear stress test   . Faintness   . Chest pain 02/09/2015  . Hypokalemia 02/09/2015  . Syncope 02/09/2015  . Greater trochanteric bursitis of left hip 01/15/2015  . Left hip pain 12/01/2014  . Head or neck swelling, mass, or lump 12/01/2014  . Dysphagia 10/14/2014  . Odynophagia 10/14/2014  . Abnormal esophagram 10/14/2014  . Oral candidiasis 10/14/2014  . Pain in joint, ankle and foot 10/21/2013  . Muscle spasm of back 02/06/2013  . Nonallopathic lesion of thoracic region 02/06/2013  . Rash, drug 11/20/2012  .  OSA (obstructive sleep apnea)   . Depression   . Obese   . Abnormal LFTs (liver function tests) 02/17/2011  . Diabetes mellitus, type 2 ([place]) 12/08/2010  . Hypertension   . Headaches, cluster     Current Outpatient Prescriptions on File Prior to Visit  Medication Sig Dispense Refill  . amLODipine (NORVASC) 10 MG tablet Take 1 tablet (10 mg total) by mouth daily. 30 tablet 0  . aspirin (GOODSENSE ASPIRIN) 81 MG chewable tablet Chew 81 mg by mouth.    . blood glucose meter kit and supplies KIT Dispense based on patient and insurance preference. Use up to four times daily as directed. (FOR ICD-9 250.00, 250.01). 1 each 0  . meloxicam (MOBIC) 7.5 MG tablet Take 7.5 mg by mouth as needed.     . metoprolol succinate (TOPROL-XL) 100 MG 24 hr tablet Take 1 tablet (100 mg total) by mouth daily. Take with or immediately following a meal. 90 tablet 1  . penicillin v potassium (VEETID) 500 MG tablet Take 2 tablets (1,000 mg total) by mouth 2 (two) times daily. X 7 days 28 tablet 0  . predniSONE (DELTASONE) 10 MG tablet Take 35 mg 1 week then decrease by 5 mg per week 100 tablet 2  . promethazine (PHENERGAN) 25 MG tablet Take 1 tablet (25 mg total) by mouth every 6 (six) hours as needed for nausea or vomiting.  10 tablet 0   Current Facility-Administered Medications on File Prior to Visit  Medication Dose Route Frequency Provider Last Rate Last Dose  . cyanocobalamin ((VITAMIN B-12)) injection 1,000 mcg  1,000 mcg Intramuscular Q30 days Binnie Rail, MD   1,000 mcg at 08/07/15 1030    Past Medical History:  Diagnosis Date  . Candidiasis of mouth    History   . Complication of anesthesia    " SLOW WAKING UP "  . Diabetes mellitus, type 2 ([place]) dx 01/2011   borderline - no current medications, diet controlled  . EP (ectopic pregnancy)   . GERD (gastroesophageal reflux disease)    no meds, diet controlled  . Gout    hx - no current problem  . Headaches, cluster    otc med prn  . History of  hyperglycemia 1993  . Hypertension   . OSA (obstructive sleep apnea)    uses CPAP nightly  . Sleep apnea    Uses CPAP every night  . Syncope 02/09/15  . Syncope 02/10/2015    Past Surgical History:  Procedure Laterality Date  . APPENDECTOMY    . CARDIAC CATHETERIZATION N/A 02/11/2015   Procedure: Left Heart Cath and Coronary Angiography;  Surgeon: Troy Sine, MD;  Location: [facility];  Service: Cardiovascular;  Laterality: N/A;- Normal results  . COLONOSCOPY  2016   Ardis Hughs - Normal  . ECTOPIC PREGNANCY SURGERY     left fallopian tube  . MYRINGOTOMY WITH TUBE PLACEMENT    . TONSILLECTOMY    . TONSILLECTOMY AND ADENOIDECTOMY  2012  . TUBAL LIGATION      Social History   Social History  . Marital status: Single    Spouse name: N/A  . Number of children: 2  . Years of education: N/A   Occupational History  . tech [employer]   Social History Main Topics  . Smoking status: Never Smoker  . Smokeless tobacco: Never Used  . Alcohol use No  . Drug use: No  . Sexual activity: Yes    Birth control/ protection: Post-menopausal   Other Topics Concern  . Not on file   Social History Narrative   Administration at university    Family History  Problem Relation Age of Onset  . Arthritis Sister   . Diabetes Mother   . Hypertension Mother   . Colon cancer Neg Hx   . Rectal cancer Neg Hx   . Stomach cancer Neg Hx   . Esophageal cancer Neg Hx     Review of Systems  Constitutional: Positive for appetite change (decreased). Negative for fever.  Eyes: Positive for visual disturbance.  Respiratory: Negative for cough, shortness of breath and wheezing.   Cardiovascular: Negative for chest pain and palpitations.  Gastrointestinal: Positive for diarrhea and nausea. Negative for abdominal pain and blood in stool.  Endocrine: Negative for polyuria.  Genitourinary: Negative for dysuria and hematuria.  Neurological: Positive for light-headedness and  headaches.       Objective:   Vitals:   10/02/15 1550  BP: (!) 148/90  Pulse: 92  Resp: 16  Temp: 98.3 F (36.8 C)   Filed Weights   10/02/15 1550  Weight: 196 lb (88.9 kg)   Body mass index is 33.64 kg/m.   Physical Exam    Constitutional: Appears well-developed and well-nourished. No distress.  HENT:  Head: Normocephalic and atraumatic.  Neck: Neck supple. No tracheal deviation present. No thyromegaly present.  Cardiovascular: Normal rate, regular  rhythm and normal heart sounds.   No murmur heard. No carotid bruit  Pulmonary/Chest: Effort normal and breath sounds normal. No respiratory distress. No has no wheezes. No rales.  Abdomen: soft, nontender, nondistended Musculoskeletal: No edema.  Lymphadenopathy: No cervical adenopathy.  Skin: Skin is warm and dry. Not diaphoretic.  Psychiatric: Normal mood and affect. Behavior is normal.     Assessment & Plan:    See Problem List for Assessment and Plan of chronic medical problems.

## 2015-10-02 NOTE — Progress Notes (Signed)
Pre visit review using our clinic review tool, if applicable. No additional management support is needed unless otherwise documented below in the visit note. 

## 2015-10-03 ENCOUNTER — Encounter: Payer: Self-pay | Admitting: Internal Medicine

## 2015-10-03 DIAGNOSIS — R197 Diarrhea, unspecified: Secondary | ICD-10-CM | POA: Insufficient documentation

## 2015-10-03 NOTE — Assessment & Plan Note (Signed)
?   Related to Crohn's, viral or Cdiff given recent antibiotics Check stool culture Advised follow up with GI cmp Stressed hydration, bland diet

## 2015-10-03 NOTE — Assessment & Plan Note (Signed)
cmp

## 2015-10-03 NOTE — Assessment & Plan Note (Signed)
On steroids - will be coming off but needs to f/u with GI Not checking sugars at home - advised her to check sugars Check a1c ? Blurry vision related to elevated sugars - will see eye doctor

## 2015-10-03 NOTE — Assessment & Plan Note (Signed)
BP high today, but she is not feeling well No change in meds today

## 2015-10-04 ENCOUNTER — Other Ambulatory Visit: Payer: Self-pay | Admitting: Internal Medicine

## 2015-10-04 DIAGNOSIS — E119 Type 2 diabetes mellitus without complications: Secondary | ICD-10-CM

## 2015-10-04 DIAGNOSIS — E876 Hypokalemia: Secondary | ICD-10-CM

## 2015-10-04 MED ORDER — POTASSIUM CHLORIDE CRYS ER 20 MEQ PO TBCR: 40.0000 meq | EXTENDED_RELEASE_TABLET | Freq: Every day | ORAL | 3 refills | Status: AC

## 2015-10-13 ENCOUNTER — Telehealth: Payer: Self-pay | Admitting: Gastroenterology

## 2015-10-13 DIAGNOSIS — R197 Diarrhea, unspecified: Secondary | ICD-10-CM

## 2015-10-13 NOTE — Telephone Encounter (Signed)
Patient wth a history of crohn's.  Patient reports that she is having terrible cramping and diarrhea 6-7 times a day.  She denies rectal bleeding or fever. She is having difficulty working due to the cramping and diarrhea.  She went to ED at [facility] over the weekend and was told to be on a liquid diet.  She is currently taking 5 mg of prednisone. Next APP appt is next Thursday.  Please advise any orders until office visit next week.

## 2015-10-14 NOTE — Telephone Encounter (Signed)
She needs to get back on higher dose prednisone (20mg  twice daily), disp one month with 2 refills.  Please double book her for Monday oct 2 with me.    Also, lets get stool testing (stool pathogen panel).  Thanks

## 2015-10-14 NOTE — Telephone Encounter (Signed)
Patient notified  Stool study orders placed She has prednisone at home.

## 2015-10-16 ENCOUNTER — Encounter (HOSPITAL_BASED_OUTPATIENT_CLINIC_OR_DEPARTMENT_OTHER): Payer: Self-pay | Admitting: *Deleted

## 2015-10-16 ENCOUNTER — Emergency Department (HOSPITAL_BASED_OUTPATIENT_CLINIC_OR_DEPARTMENT_OTHER)
Admission: EM | Admit: 2015-10-16 | Discharge: 2015-10-16 | Disposition: A | Discharge status: Home / Self Care | Payer: BC Managed Care – PPO | Attending: Emergency Medicine | Admitting: Emergency Medicine

## 2015-10-16 DIAGNOSIS — R1084 Generalized abdominal pain: Secondary | ICD-10-CM | POA: Diagnosis present

## 2015-10-16 DIAGNOSIS — K5090 Crohn's disease, unspecified, without complications: Secondary | ICD-10-CM | POA: Insufficient documentation

## 2015-10-16 DIAGNOSIS — I10 Essential (primary) hypertension: Secondary | ICD-10-CM | POA: Diagnosis not present

## 2015-10-16 DIAGNOSIS — Z79899 Other long term (current) drug therapy: Secondary | ICD-10-CM | POA: Insufficient documentation

## 2015-10-16 DIAGNOSIS — Z7984 Long term (current) use of oral hypoglycemic drugs: Secondary | ICD-10-CM | POA: Insufficient documentation

## 2015-10-16 DIAGNOSIS — E119 Type 2 diabetes mellitus without complications: Secondary | ICD-10-CM | POA: Insufficient documentation

## 2015-10-16 DIAGNOSIS — Z7982 Long term (current) use of aspirin: Secondary | ICD-10-CM | POA: Diagnosis not present

## 2015-10-16 LAB — CBC
HEMATOCRIT: 28.1 % — AB (ref 36.0–46.0)
HEMOGLOBIN: 9.1 g/dL — AB (ref 12.0–15.0)
MCH: 28.3 pg (ref 26.0–34.0)
MCHC: 32.4 g/dL (ref 30.0–36.0)
MCV: 87.3 fL (ref 78.0–100.0)
Platelets: 382 10*3/uL (ref 150–400)
RBC: 3.22 MIL/uL — AB (ref 3.87–5.11)
RDW: 15.5 % (ref 11.5–15.5)
WBC: 5.9 10*3/uL (ref 4.0–10.5)

## 2015-10-16 LAB — COMPREHENSIVE METABOLIC PANEL
ALBUMIN: 3.4 g/dL — AB (ref 3.5–5.0)
ALK PHOS: 71 U/L (ref 38–126)
ALT: 11 U/L — ABNORMAL LOW (ref 14–54)
ANION GAP: 8 (ref 5–15)
AST: 26 U/L (ref 15–41)
BUN: 9 mg/dL (ref 6–20)
CALCIUM: 8.4 mg/dL — AB (ref 8.9–10.3)
CHLORIDE: 101 mmol/L (ref 101–111)
CO2: 27 mmol/L (ref 22–32)
Creatinine, Ser: 1.2 mg/dL — ABNORMAL HIGH (ref 0.44–1.00)
GFR calc non Af Amer: 51 mL/min — ABNORMAL LOW (ref >60)
GFR, EST AFRICAN AMERICAN: 59 mL/min — AB (ref >60)
GLUCOSE: 107 mg/dL — AB (ref 65–99)
Potassium: 2.9 mmol/L — ABNORMAL LOW (ref 3.5–5.1)
SODIUM: 136 mmol/L (ref 135–145)
Total Bilirubin: 0.5 mg/dL (ref 0.3–1.2)
Total Protein: 6.9 g/dL (ref 6.5–8.1)

## 2015-10-16 LAB — LIPASE, BLOOD: LIPASE: 21 U/L (ref 11–51)

## 2015-10-16 MED ORDER — MORPHINE SULFATE (PF) 4 MG/ML IV SOLN
4.0000 mg | Freq: Once | INTRAVENOUS | Status: AC
  Administered 2015-10-16: 4 mg via INTRAVENOUS
  Filled 2015-10-16: qty 1

## 2015-10-16 MED ORDER — METHYLPREDNISOLONE SODIUM SUCC 125 MG IJ SOLR
125.0000 mg | Freq: Once | INTRAMUSCULAR | Status: AC
  Administered 2015-10-16: 125 mg via INTRAVENOUS
  Filled 2015-10-16: qty 2

## 2015-10-16 MED ORDER — OXYCODONE-ACETAMINOPHEN 5-325 MG PO TABS: 1.0000 | ORAL_TABLET | Freq: Four times a day (QID) | ORAL | 0 refills | Status: AC | PRN

## 2015-10-16 MED ORDER — ONDANSETRON HCL 4 MG/2ML IJ SOLN
4.0000 mg | Freq: Once | INTRAMUSCULAR | Status: AC | PRN
  Administered 2015-10-16: 4 mg via INTRAVENOUS
  Filled 2015-10-16: qty 2

## 2015-10-16 MED ORDER — ONDANSETRON HCL 4 MG/2ML IJ SOLN: 4.0000 mg | Freq: Once | INTRAMUSCULAR | Status: DC

## 2015-10-16 MED ORDER — SODIUM CHLORIDE 0.9 % IV BOLUS (SEPSIS)
1000.0000 mL | Freq: Once | INTRAVENOUS | Status: AC
  Administered 2015-10-16: 1000 mL via INTRAVENOUS

## 2015-10-16 MED ORDER — PREDNISONE 20 MG PO TABS: ORAL_TABLET | ORAL | 0 refills | Status: DC

## 2015-10-16 MED FILL — OXYCODONE/APAP 5/325MG: 5-325 | 2 days supply | Qty: 15 | Fill #0

## 2015-10-16 NOTE — ED Provider Notes (Signed)
[place] DE[facility] Provider Note   CSN: [id number] Arrival date & time: 10/16/15  1159     History   Chief Complaint Chief Complaint  Patient presents with  . Abdominal Pain    HPI Nancy Acevedo is a 55 y.o. female.  Patient is a 55 year old female with past medical history of diabetes, hypertension, and Crohn's disease. She presents for evaluation of generalized abdominal discomfort, loose stools, and abdominal cramping that is worsened over the past several days. She denies any fevers or chills. She denies any bloody stool. She follows with Dr. Ardis Hughs from gastroenterology. She is currently taking prednisone 5 mg daily for her Crohn's disease. She has an appointment to see him on October 2. She denies any urinary complaints. She denies any aggravating or alleviating factors.      Past Medical History:  Diagnosis Date  . Candidiasis of mouth    History   . Complication of anesthesia    " SLOW WAKING UP "  . Diabetes mellitus, type 2 ([place]) dx 01/2011   borderline - no current medications, diet controlled  . EP (ectopic pregnancy)   . GERD (gastroesophageal reflux disease)    no meds, diet controlled  . Gout    hx - no current problem  . Headaches, cluster    otc med prn  . History of hyperglycemia 1993  . Hypertension   . OSA (obstructive sleep apnea)    uses CPAP nightly  . Sleep apnea    Uses CPAP every night  . Syncope 02/09/15  . Syncope 02/10/2015    Patient Active Problem List   Diagnosis Date Noted  . Diarrhea 10/03/2015  . Crohn's colitis ([place]) 08/07/2015  . B12 deficiency 04/15/2015  . Gastroesophageal reflux disease with esophagitis 04/15/2015  . BPPV (benign paroxysmal positional vertigo) 02/19/2015  . Abnormal nuclear stress test   . Faintness   . Chest pain 02/09/2015  . Hypokalemia 02/09/2015  . Syncope 02/09/2015  . Greater trochanteric bursitis of left hip 01/15/2015  . Left hip pain 12/01/2014  . Head or neck swelling, mass, or  lump 12/01/2014  . Dysphagia 10/14/2014  . Odynophagia 10/14/2014  . Abnormal esophagram 10/14/2014  . Oral candidiasis 10/14/2014  . Pain in joint, ankle and foot 10/21/2013  . Muscle spasm of back 02/06/2013  . Nonallopathic lesion of thoracic region 02/06/2013  . Rash, drug 11/20/2012  . OSA (obstructive sleep apnea)   . Depression   . Obese   . Abnormal LFTs (liver function tests) 02/17/2011  . Diabetes mellitus, type 2 ([place]) 12/08/2010  . Hypertension   . Headaches, cluster     Past Surgical History:  Procedure Laterality Date  . APPENDECTOMY    . CARDIAC CATHETERIZATION N/A 02/11/2015   Procedure: Left Heart Cath and Coronary Angiography;  Surgeon: Troy Sine, MD;  Location: [place] [facility];  Service: Cardiovascular;  Laterality: N/A;- Normal results  . COLONOSCOPY  2016   Ardis Hughs - Normal  . ECTOPIC PREGNANCY SURGERY     left fallopian tube  . MYRINGOTOMY WITH TUBE PLACEMENT    . TONSILLECTOMY    . TONSILLECTOMY AND ADENOIDECTOMY  2012  . TUBAL LIGATION      OB History    No data available       Home Medications    Prior to Admission medications   Medication Sig Start Date End Date Taking? Authorizing Provider  amLODipine (NORVASC) 10 MG tablet Take 1 tablet (10 mg total) by mouth daily. 02/10/15  Nita Sells, MD  aspirin (GOODSENSE ASPIRIN) 81 MG chewable tablet Chew 81 mg by mouth. 10/18/14   Historical Provider, MD  blood glucose meter kit and supplies KIT Dispense based on patient and insurance preference. Use up to four times daily as directed. (FOR ICD-9 250.00, 250.01). 08/07/15   Binnie Rail, MD  meloxicam (MOBIC) 7.5 MG tablet Take 7.5 mg by mouth as needed.  07/24/15   Historical Provider, MD  metoprolol succinate (TOPROL-XL) 100 MG 24 hr tablet Take 1 tablet (100 mg total) by mouth daily. Take with or immediately following a meal. 12/01/14   Binnie Rail, MD  ondansetron (ZOFRAN ODT) 4 MG disintegrating tablet Take 1 tablet (4 mg total) by  mouth every 8 (eight) hours as needed for nausea or vomiting. 10/02/15   Binnie Rail, MD  potassium chloride SA (K-DUR,KLOR-CON) 20 MEQ tablet Take 2 tablets (40 mEq total) by mouth daily. 10/04/15   Binnie Rail, MD  predniSONE (DELTASONE) 10 MG tablet Take 35 mg 1 week then decrease by 5 mg per week 09/04/15   Milus Banister, MD  promethazine (PHENERGAN) 25 MG tablet Take 1 tablet (25 mg total) by mouth every 6 (six) hours as needed for nausea or vomiting. 09/29/15   Dalia Heading, PA-C    Family History Family History  Problem Relation Age of Onset  . Arthritis Sister   . Diabetes Mother   . Hypertension Mother   . Colon cancer Neg Hx   . Rectal cancer Neg Hx   . Stomach cancer Neg Hx   . Esophageal cancer Neg Hx     Social History Social History  Substance Use Topics  . Smoking status: Never Smoker  . Smokeless tobacco: Never Used  . Alcohol use No     Allergies   Lexapro [escitalopram]; Losartan; and Spironolactone   Review of Systems Review of Systems  All other systems reviewed and are negative.    Physical Exam Updated Vital Signs BP (!) 182/117   Pulse 103   Temp 98.3 F (36.8 C)   Resp 16   Ht _0  (1.626 m)   Wt 195 lb (88.5 kg)   SpO2 96%   BMI 33.47 kg/m   Physical Exam  Constitutional: She is oriented to person, place, and time. She appears well-developed and well-nourished. No distress.  HENT:  Head: Normocephalic and atraumatic.  Neck: Normal range of motion. Neck supple.  Cardiovascular: Normal rate and regular rhythm.  Exam reveals no gallop and no friction rub.   No murmur heard. Pulmonary/Chest: Effort normal and breath sounds normal. No respiratory distress. She has no wheezes.  Abdominal: Soft. Bowel sounds are normal. She exhibits no distension. There is tenderness.  There is generalized abdominal tenderness. There is no distention, rebound, or guarding.  Musculoskeletal: Normal range of motion.  Neurological: She is alert and  oriented to person, place, and time.  Skin: Skin is warm and dry. She is not diaphoretic.  Nursing note and vitals reviewed.    ED Treatments / Results  Labs (all labs ordered are listed, but only abnormal results are displayed) Labs Reviewed  COMPREHENSIVE METABOLIC PANEL - Abnormal; Notable for the following:       Result Value   Potassium 2.9 (*)    Glucose, Bld 107 (*)    Creatinine, Ser 1.20 (*)    Calcium 8.4 (*)    Albumin 3.4 (*)    ALT 11 (*)    GFR calc non Af  Amer 51 (*)    GFR calc Af Amer 59 (*)    All other components within normal limits  CBC - Abnormal; Notable for the following:    RBC 3.22 (*)    Hemoglobin 9.1 (*)    HCT 28.1 (*)    All other components within normal limits  LIPASE, BLOOD  URINALYSIS, ROUTINE W REFLEX MICROSCOPIC (NOT AT [facility])    EKG  EKG Interpretation None       Radiology No results found.  Procedures Procedures (including critical care time)  Medications Ordered in ED Medications  sodium chloride 0.9 % bolus 1,000 mL (not administered)  morphine 4 MG/ML injection 4 mg (not administered)  ondansetron (ZOFRAN) injection 4 mg (not administered)  methylPREDNISolone sodium succinate (SOLU-MEDROL) 125 mg/2 mL injection 125 mg (not administered)  ondansetron (ZOFRAN) injection 4 mg (4 mg Intravenous Given 10/16/15 1235)     Initial Impression / Assessment and Plan / ED Course  I have reviewed the triage vital signs and the nursing notes.  Pertinent labs & imaging results that were available during my care of the patient were reviewed by me and considered in my medical decision making (see chart for details).  Clinical Course    Patient feeling better after medications and fluids administered in the ER. This appears to be a flareup of Crohn's disease. She will be given a prednisone taper and advised to follow-up with her gastroenterologist on Monday as previously scheduled. She is to return to the ER if she develops worsening  pain, fevers, or bloody stools.  Final Clinical Impressions(s) / ED Diagnoses   Final diagnoses:  None    New Prescriptions New Prescriptions   No medications on file     Veryl Speak, MD 10/16/15 1414

## 2015-10-16 NOTE — Discharge Instructions (Signed)
Prednisone as prescribed.  Percocet as prescribed as needed for pain.  Follow-up with your gastroenterologist as scheduled on Monday, and return to the ER if you develop worsening pain, high fevers, bloody stools, or other new and concerning symptoms.

## 2015-10-16 NOTE — ED Triage Notes (Signed)
Pt c/o severe abd pain with n/v/d x 1 month. Crohn's disease

## 2015-10-19 ENCOUNTER — Other Ambulatory Visit (INDEPENDENT_AMBULATORY_CARE_PROVIDER_SITE_OTHER): Payer: BC Managed Care – PPO

## 2015-10-19 ENCOUNTER — Encounter: Payer: Self-pay | Admitting: Gastroenterology

## 2015-10-19 ENCOUNTER — Ambulatory Visit (INDEPENDENT_AMBULATORY_CARE_PROVIDER_SITE_OTHER): Payer: BC Managed Care – PPO | Admitting: Gastroenterology

## 2015-10-19 VITALS — BP 190/124 | HR 100 | Ht 64.0 in | Wt 191.6 lb

## 2015-10-19 DIAGNOSIS — R109 Unspecified abdominal pain: Secondary | ICD-10-CM

## 2015-10-19 DIAGNOSIS — K50919 Crohn's disease, unspecified, with unspecified complications: Secondary | ICD-10-CM

## 2015-10-19 DIAGNOSIS — R1084 Generalized abdominal pain: Secondary | ICD-10-CM | POA: Diagnosis not present

## 2015-10-19 LAB — BASIC METABOLIC PANEL
BUN: 17 mg/dL (ref 6–23)
CHLORIDE: 103 meq/L (ref 96–112)
CO2: 28 mEq/L (ref 19–32)
CREATININE: 1.1 mg/dL (ref 0.40–1.20)
Calcium: 8.7 mg/dL (ref 8.4–10.5)
GFR: 66.59 mL/min (ref >60.00)
Glucose, Bld: 99 mg/dL (ref 70–99)
POTASSIUM: 4.1 meq/L (ref 3.5–5.1)
SODIUM: 139 meq/L (ref 135–145)

## 2015-10-19 LAB — HIGH SENSITIVITY CRP: CRP HIGH SENSITIVITY: 9.4 mg/L — AB (ref 0.000–5.000)

## 2015-10-19 LAB — SEDIMENTATION RATE: SED RATE: 50 mm/h — AB (ref 0–30)

## 2015-10-19 NOTE — Patient Instructions (Addendum)
MR enterography for probable crohn's disease, abd pain (status of her inflammation).    You have been scheduled for an MRI at [facility] radiology on 10/27/15. Your appointment time is 3 pm. Please arrive 60 minutes prior to your appointment time for registration purposes. Please make certain not to have anything to eat or drink 4  hours prior to your test. In addition, if you have any metal in your body, have a pacemaker or defibrillator, please be sure to let your ordering physician know. This test typically takes 45 minutes to 1 hour to complete.  You will have labs checked today in the basement lab.  Please head down after you check out with the front desk  (esr, crp, bmet).   Continue prednisone '20mg'$  twice daily for now.

## 2015-10-19 NOTE — Progress Notes (Signed)
Review of pertinent gastrointestinal problems: 1. Routine risk for colon cancer: Colonoscopy May 2016 Dr. Owens Loffler for routine screening. No polyps were found. She did have left-sided diverticulosis. She was recommended to have repeat colon cancer screening colonoscopy in 10 years. 2. Acute dysphasia, odontophagia October 2016, abnormal esophagram. EGD 10/2014 The esophageal mucosa was abnormal appearing throughout but mainly in the proximal 1/2 of the esophagus. There were numerous linear and irregularly patterned furrows throughout. No obvious candida infection or ulcers. The esophagus was biopsied (distally and proximally) and the mucosa seemed to be thick, firm. The examination was otherwise normal. Biopsies suggested reflux related injury, no sign of fungus, EoE. Symptoms improved with BID PPI 3. Diarrhea, IBD?? (crohns ileocolitis): May 2017. ttg normal, total Iga normal; sed rate and CRP very elevated..  Stool studies for infection were all negative. Course of antibiotics did not help.  CT scan showed "masslike thickening of the cecum" She eventually underwent colonoscopy July 2017 Dr. Ardis Hughs. This found a large ulcer in the terminal ileum though somewhat masslike, also several punctate ulcers in the right colon. Multiple biopsies were taken from these sites and this showed only active inflammation no clear Crohn's disease.  I spoke with patholgy and they felt it was still possibly IBD.  Prednisone was started 20mg  twice daily.   HPI: This is a very pleasant 55 year old woman whom I last saw about 6 weeks ago  Chief complaint is continued abdominal pain, diarrhea  She has lost 10 pounds in 6 weeks  ROS: complete GI ROS as described in HPI.  Constitutional:  No unintentional weight loss  CT scan abdomen and pelvis with IV contrast. last month was essentially normal  She is currently taking 20mg  prednisone twice daily. This has been for about 2 weeks.  Phone note from last week  that says she was on 5mg  prednisone daily IS NOT CORRECT.  She has 10 loose stools per day.  No bleeding.  Eating poorly, small meals per day.  She is completely off NSAIDs.   Past Medical History:  Diagnosis Date  . Candidiasis of mouth    History   . Complication of anesthesia    " SLOW WAKING UP "  . Diabetes mellitus, type 2 ([place]) dx 01/2011   borderline - no current medications, diet controlled  . EP (ectopic pregnancy)   . GERD (gastroesophageal reflux disease)    no meds, diet controlled  . Gout    hx - no current problem  . Headaches, cluster    otc med prn  . History of hyperglycemia 1993  . Hypertension   . OSA (obstructive sleep apnea)    uses CPAP nightly  . Sleep apnea    Uses CPAP every night  . Syncope 02/09/15  . Syncope 02/10/2015    Past Surgical History:  Procedure Laterality Date  . APPENDECTOMY    . CARDIAC CATHETERIZATION N/A 02/11/2015   Procedure: Left Heart Cath and Coronary Angiography;  Surgeon: Troy Sine, MD;  Location: [facility];  Service: Cardiovascular;  Laterality: N/A;- Normal results  . COLONOSCOPY  2016   Ardis Hughs - Normal  . ECTOPIC PREGNANCY SURGERY     left fallopian tube  . MYRINGOTOMY WITH TUBE PLACEMENT    . TONSILLECTOMY    . TONSILLECTOMY AND ADENOIDECTOMY  2012  . TUBAL LIGATION      Current Outpatient Prescriptions  Medication Sig Dispense Refill  . amLODipine (NORVASC) 10 MG tablet Take 1 tablet (10 mg total) by  mouth daily. 30 tablet 0  . aspirin (GOODSENSE ASPIRIN) 81 MG chewable tablet Chew 81 mg by mouth.    . metoprolol succinate (TOPROL-XL) 100 MG 24 hr tablet Take 1 tablet (100 mg total) by mouth daily. Take with or immediately following a meal. 90 tablet 1  . ondansetron (ZOFRAN ODT) 4 MG disintegrating tablet Take 1 tablet (4 mg total) by mouth every 8 (eight) hours as needed for nausea or vomiting. 30 tablet 0  . oxyCODONE-acetaminophen (PERCOCET) 5-325 MG tablet Take 1-2 tablets by mouth every 6  (six) hours as needed. 15 tablet 0  . potassium chloride SA (K-DUR,KLOR-CON) 20 MEQ tablet Take 2 tablets (40 mEq total) by mouth daily. 60 tablet 3  . predniSONE (DELTASONE) 20 MG tablet Take 3 tablets daily for 3 days, then 2 tablets daily for 3 days, then 1 tablet daily for 3 days, then resume your previous dose. 18 tablet 0  . promethazine (PHENERGAN) 25 MG tablet Take 1 tablet (25 mg total) by mouth every 6 (six) hours as needed for nausea or vomiting. 10 tablet 0   Current Facility-Administered Medications  Medication Dose Route Frequency Provider Last Rate Last Dose  . cyanocobalamin ((VITAMIN B-12)) injection 1,000 mcg  1,000 mcg Intramuscular Q30 days Binnie Rail, MD   1,000 mcg at 08/07/15 1030    Allergies as of 10/19/2015 - Review Complete 10/19/2015  Allergen Reaction Noted  . Lexapro [escitalopram] Itching 05/06/2013  . Losartan  08/29/2015  . Spironolactone  11/20/2012    Family History  Problem Relation Age of Onset  . Arthritis Sister   . Diabetes Mother   . Hypertension Mother   . Colon cancer Neg Hx   . Rectal cancer Neg Hx   . Stomach cancer Neg Hx   . Esophageal cancer Neg Hx     Social History   Social History  . Marital status: Single    Spouse name: N/A  . Number of children: 2  . Years of education: N/A   Occupational History  . tech [employer]   Social History Main Topics  . Smoking status: Never Smoker  . Smokeless tobacco: Never Used  . Alcohol use No  . Drug use: No  . Sexual activity: Yes    Birth control/ protection: Post-menopausal   Other Topics Concern  . Not on file   Social History Narrative   Administration at [employer]     Physical Exam: BP (!) 166/120 (BP Location: Left Arm, Patient Position: Sitting, Cuff Size: Normal)   Pulse 100   Ht 5\' 4"  (1.626 m)   Wt 191 lb 9.6 oz (86.9 kg)   BMI 32.89 kg/m  Constitutional: acutely and chronically ill appearing Psychiatric: alert and oriented x3 Abdomen:  soft, nmild to moderate abd pain throughout, nondistended, no obvious ascites, no peritoneal signs, normal bowel sounds No peripheral edema noted in lower extremities  Assessment and plan: 55 y.o. female with Abdominal pain, diarrhea  It is still not clear to me that she has   inflammatory bowel disease. Endoscopic appearance was a bit unusual for Crohn's disease, pathology showed only acute inflammation, she was also taking a lot of NSAIDs around that time. Currently she is in a lot of abdominal pain and has been for several weeks however CT scan done 3 weeks ago shows no bowel thickening, no obvious bowel inflammation. I spoke with the radiologist about this today and he confirmed that to be the case. There is just discordant data  here. I'm going to repeat imaging with MR enterography as well as basic metabolic profile and inflammatory markers. If this does not clear things up and she may need repeat endoscopic testing colonoscopy and/or upper endoscopy. She is going to stay on prednisone 20 mg twice daily for now without any taper.    Owens Loffler, MD [facility] 10/19/2015, 3:26 PM

## 2015-10-22 ENCOUNTER — Ambulatory Visit: Payer: BC Managed Care – PPO | Admitting: Physician Assistant

## 2015-10-26 ENCOUNTER — Telehealth: Payer: Self-pay | Admitting: Internal Medicine

## 2015-10-26 ENCOUNTER — Other Ambulatory Visit: Payer: BC Managed Care – PPO

## 2015-10-26 DIAGNOSIS — R197 Diarrhea, unspecified: Secondary | ICD-10-CM

## 2015-10-26 MED ORDER — MECLIZINE HCL 25 MG PO TABS: 12.5000 mg | ORAL_TABLET | Freq: Three times a day (TID) | ORAL | 0 refills | Status: AC | PRN

## 2015-10-26 NOTE — Telephone Encounter (Signed)
Patient states that she had spoken to Dr. Quay Burow about dizzy spells she was having on last OV.  She is requesting something to be sent to her pharmacy for this.  Patient uses [facility] in [place] on [address].

## 2015-10-26 NOTE — Telephone Encounter (Signed)
LVM informing pt

## 2015-10-26 NOTE — Telephone Encounter (Signed)
Please advise 

## 2015-10-26 NOTE — Telephone Encounter (Signed)
Meclizine sent in to pharmacy.  This can cause some drowsiness.  If there is no improvement she should be evaluated.

## 2015-10-27 ENCOUNTER — Ambulatory Visit (HOSPITAL_COMMUNITY)
Admission: RE | Admit: 2015-10-27 | Discharge: 2015-10-27 | Disposition: A | Discharge status: Home / Self Care | Payer: BC Managed Care – PPO | Source: Ambulatory Visit | Attending: Gastroenterology | Admitting: Gastroenterology

## 2015-10-27 ENCOUNTER — Ambulatory Visit (HOSPITAL_COMMUNITY): Payer: BC Managed Care – PPO

## 2015-10-27 DIAGNOSIS — D252 Subserosal leiomyoma of uterus: Secondary | ICD-10-CM | POA: Insufficient documentation

## 2015-10-27 DIAGNOSIS — R109 Unspecified abdominal pain: Secondary | ICD-10-CM | POA: Diagnosis present

## 2015-10-27 DIAGNOSIS — K5000 Crohn's disease of small intestine without complications: Secondary | ICD-10-CM | POA: Insufficient documentation

## 2015-10-27 DIAGNOSIS — K50919 Crohn's disease, unspecified, with unspecified complications: Secondary | ICD-10-CM

## 2015-10-27 MED ORDER — GADOBENATE DIMEGLUMINE 529 MG/ML IV SOLN
18.0000 mL | Freq: Once | INTRAVENOUS | Status: AC | PRN
  Administered 2015-10-27: 18 mL via INTRAVENOUS

## 2015-10-28 ENCOUNTER — Telehealth: Payer: Self-pay | Admitting: Gastroenterology

## 2015-10-28 ENCOUNTER — Other Ambulatory Visit: Payer: Self-pay

## 2015-10-28 ENCOUNTER — Other Ambulatory Visit: Payer: BC Managed Care – PPO

## 2015-10-28 ENCOUNTER — Telehealth: Payer: Self-pay

## 2015-10-28 DIAGNOSIS — K50119 Crohn's disease of large intestine with unspecified complications: Secondary | ICD-10-CM

## 2015-10-28 LAB — GASTROINTESTINAL PATHOGEN PANEL PCR
C. difficile Tox A/B, PCR: NOT DETECTED
CAMPYLOBACTER, PCR: NOT DETECTED
Cryptosporidium, PCR: NOT DETECTED
E COLI 0157, PCR: NOT DETECTED
E coli (ETEC) LT/ST PCR: NOT DETECTED
E coli (STEC) stx1/stx2, PCR: NOT DETECTED
GIARDIA LAMBLIA, PCR: NOT DETECTED
NOROVIRUS, PCR: NOT DETECTED
ROTAVIRUS, PCR: NOT DETECTED
SALMONELLA, PCR: NOT DETECTED
SHIGELLA, PCR: NOT DETECTED

## 2015-10-28 MED ORDER — HYDROCODONE-ACETAMINOPHEN 7.5-300 MG PO TABS: ORAL_TABLET | ORAL | 0 refills | Status: AC

## 2015-10-28 NOTE — Telephone Encounter (Signed)
See MRI report

## 2015-10-28 NOTE — Telephone Encounter (Signed)
-----  Message from Algernon Huxley, RN sent at 10/28/2015  2:39 PM EDT ----- Regarding: FW: remicade Nashira Mcglynn this is Jacob's pt that I placed referral for Remicade  ----- Message ----- From: Darden Dates Sent: 10/28/2015   2:19 PM To: Algernon Huxley, RN Subject: [place], I called [employer] for this patient.   Benefits are... After the patient meets her 1250 DED they would cover 80% up to her 4350 oop max. Right now, she has already met everything, so she would be covered at 100%.  The only thing is after the first of the year, it will start over at full 1250 ded. If she wants to go with it, go ahead and schedule atleast a week to 10 days out and I will start precert so that I have a date and place of service. Just let me know ... Thanks, Amy

## 2015-10-28 NOTE — Telephone Encounter (Signed)
Pt states she returned her stool sample to the lab yesterday and she had her MR enterography of A/P yesterday. Reports she is still having about 10 diarrhea stools/day, no blood. Pt states she is having terrible pain in her stomach. Pt is requesting something for the pain. Please advise.

## 2015-10-28 NOTE — Telephone Encounter (Signed)
Left message for pt that script is up front for pt to pick up.

## 2015-10-29 LAB — HEPATITIS C ANTIBODY: HCV Ab: NEGATIVE

## 2015-10-29 LAB — HEPATITIS B SURFACE ANTIBODY,QUALITATIVE: HEP B S AB: NEGATIVE

## 2015-10-29 LAB — HEPATITIS B SURFACE ANTIGEN: Hepatitis B Surface Ag: NEGATIVE

## 2015-10-30 ENCOUNTER — Emergency Department (HOSPITAL_BASED_OUTPATIENT_CLINIC_OR_DEPARTMENT_OTHER): Payer: BC Managed Care – PPO

## 2015-10-30 ENCOUNTER — Inpatient Hospital Stay (HOSPITAL_BASED_OUTPATIENT_CLINIC_OR_DEPARTMENT_OTHER)
Admission: EM | Admit: 2015-10-30 | Discharge: 2015-12-18 | DRG: 974 | Disposition: E | Payer: BC Managed Care – PPO | Attending: Internal Medicine | Admitting: Internal Medicine

## 2015-10-30 ENCOUNTER — Encounter (HOSPITAL_BASED_OUTPATIENT_CLINIC_OR_DEPARTMENT_OTHER): Payer: Self-pay | Admitting: *Deleted

## 2015-10-30 DIAGNOSIS — K50113 Crohn's disease of large intestine with fistula: Secondary | ICD-10-CM | POA: Diagnosis present

## 2015-10-30 DIAGNOSIS — E875 Hyperkalemia: Secondary | ICD-10-CM | POA: Diagnosis not present

## 2015-10-30 DIAGNOSIS — E870 Hyperosmolality and hypernatremia: Secondary | ICD-10-CM | POA: Diagnosis not present

## 2015-10-30 DIAGNOSIS — L89892 Pressure ulcer of other site, stage 2: Secondary | ICD-10-CM | POA: Diagnosis not present

## 2015-10-30 DIAGNOSIS — Z515 Encounter for palliative care: Secondary | ICD-10-CM | POA: Diagnosis not present

## 2015-10-30 DIAGNOSIS — R079 Chest pain, unspecified: Secondary | ICD-10-CM | POA: Diagnosis not present

## 2015-10-30 DIAGNOSIS — J9600 Acute respiratory failure, unspecified whether with hypoxia or hypercapnia: Secondary | ICD-10-CM

## 2015-10-30 DIAGNOSIS — R0602 Shortness of breath: Secondary | ICD-10-CM

## 2015-10-30 DIAGNOSIS — Z8261 Family history of arthritis: Secondary | ICD-10-CM

## 2015-10-30 DIAGNOSIS — B59 Pneumocystosis: Secondary | ICD-10-CM

## 2015-10-30 DIAGNOSIS — E871 Hypo-osmolality and hyponatremia: Secondary | ICD-10-CM | POA: Diagnosis not present

## 2015-10-30 DIAGNOSIS — D638 Anemia in other chronic diseases classified elsewhere: Secondary | ICD-10-CM | POA: Diagnosis present

## 2015-10-30 DIAGNOSIS — Z8249 Family history of ischemic heart disease and other diseases of the circulatory system: Secondary | ICD-10-CM

## 2015-10-30 DIAGNOSIS — B20 Human immunodeficiency virus [HIV] disease: Secondary | ICD-10-CM

## 2015-10-30 DIAGNOSIS — K2100 Gastro-esophageal reflux disease with esophagitis, without bleeding: Secondary | ICD-10-CM | POA: Diagnosis present

## 2015-10-30 DIAGNOSIS — K50813 Crohn's disease of both small and large intestine with fistula: Secondary | ICD-10-CM | POA: Diagnosis present

## 2015-10-30 DIAGNOSIS — T447X6A Underdosing of beta-adrenoreceptor antagonists, initial encounter: Secondary | ICD-10-CM | POA: Diagnosis present

## 2015-10-30 DIAGNOSIS — B370 Candidal stomatitis: Secondary | ICD-10-CM | POA: Diagnosis not present

## 2015-10-30 DIAGNOSIS — E876 Hypokalemia: Secondary | ICD-10-CM | POA: Diagnosis present

## 2015-10-30 DIAGNOSIS — I248 Other forms of acute ischemic heart disease: Secondary | ICD-10-CM | POA: Diagnosis present

## 2015-10-30 DIAGNOSIS — A419 Sepsis, unspecified organism: Secondary | ICD-10-CM | POA: Diagnosis present

## 2015-10-30 DIAGNOSIS — J9601 Acute respiratory failure with hypoxia: Secondary | ICD-10-CM

## 2015-10-30 DIAGNOSIS — I959 Hypotension, unspecified: Secondary | ICD-10-CM | POA: Diagnosis not present

## 2015-10-30 DIAGNOSIS — K50119 Crohn's disease of large intestine with unspecified complications: Secondary | ICD-10-CM | POA: Diagnosis not present

## 2015-10-30 DIAGNOSIS — D696 Thrombocytopenia, unspecified: Secondary | ICD-10-CM | POA: Diagnosis present

## 2015-10-30 DIAGNOSIS — N182 Chronic kidney disease, stage 2 (mild): Secondary | ICD-10-CM | POA: Diagnosis not present

## 2015-10-30 DIAGNOSIS — Z9911 Dependence on respirator [ventilator] status: Secondary | ICD-10-CM | POA: Diagnosis not present

## 2015-10-30 DIAGNOSIS — Y92009 Unspecified place in unspecified non-institutional (private) residence as the place of occurrence of the external cause: Secondary | ICD-10-CM

## 2015-10-30 DIAGNOSIS — N179 Acute kidney failure, unspecified: Secondary | ICD-10-CM | POA: Diagnosis present

## 2015-10-30 DIAGNOSIS — T461X6A Underdosing of calcium-channel blockers, initial encounter: Secondary | ICD-10-CM | POA: Diagnosis present

## 2015-10-30 DIAGNOSIS — G4733 Obstructive sleep apnea (adult) (pediatric): Secondary | ICD-10-CM | POA: Diagnosis present

## 2015-10-30 DIAGNOSIS — Z66 Do not resuscitate: Secondary | ICD-10-CM | POA: Diagnosis not present

## 2015-10-30 DIAGNOSIS — I517 Cardiomegaly: Secondary | ICD-10-CM | POA: Diagnosis present

## 2015-10-30 DIAGNOSIS — R0902 Hypoxemia: Secondary | ICD-10-CM

## 2015-10-30 DIAGNOSIS — I160 Hypertensive urgency: Secondary | ICD-10-CM | POA: Diagnosis present

## 2015-10-30 DIAGNOSIS — J189 Pneumonia, unspecified organism: Secondary | ICD-10-CM

## 2015-10-30 DIAGNOSIS — Z4659 Encounter for fitting and adjustment of other gastrointestinal appliance and device: Secondary | ICD-10-CM

## 2015-10-30 DIAGNOSIS — R0682 Tachypnea, not elsewhere classified: Secondary | ICD-10-CM

## 2015-10-30 DIAGNOSIS — E119 Type 2 diabetes mellitus without complications: Secondary | ICD-10-CM

## 2015-10-30 DIAGNOSIS — J9690 Respiratory failure, unspecified, unspecified whether with hypoxia or hypercapnia: Secondary | ICD-10-CM

## 2015-10-30 DIAGNOSIS — J811 Chronic pulmonary edema: Secondary | ICD-10-CM

## 2015-10-30 DIAGNOSIS — J810 Acute pulmonary edema: Secondary | ICD-10-CM | POA: Diagnosis present

## 2015-10-30 DIAGNOSIS — I129 Hypertensive chronic kidney disease with stage 1 through stage 4 chronic kidney disease, or unspecified chronic kidney disease: Secondary | ICD-10-CM | POA: Diagnosis present

## 2015-10-30 DIAGNOSIS — R0603 Acute respiratory distress: Secondary | ICD-10-CM | POA: Diagnosis not present

## 2015-10-30 DIAGNOSIS — Z7952 Long term (current) use of systemic steroids: Secondary | ICD-10-CM

## 2015-10-30 DIAGNOSIS — D519 Vitamin B12 deficiency anemia, unspecified: Secondary | ICD-10-CM | POA: Diagnosis present

## 2015-10-30 DIAGNOSIS — R509 Fever, unspecified: Secondary | ICD-10-CM

## 2015-10-30 DIAGNOSIS — D893 Immune reconstitution syndrome: Secondary | ICD-10-CM | POA: Diagnosis present

## 2015-10-30 DIAGNOSIS — L8990 Pressure ulcer of unspecified site, unspecified stage: Secondary | ICD-10-CM | POA: Insufficient documentation

## 2015-10-30 DIAGNOSIS — N184 Chronic kidney disease, stage 4 (severe): Secondary | ICD-10-CM | POA: Diagnosis not present

## 2015-10-30 DIAGNOSIS — N170 Acute kidney failure with tubular necrosis: Secondary | ICD-10-CM | POA: Diagnosis not present

## 2015-10-30 DIAGNOSIS — Z833 Family history of diabetes mellitus: Secondary | ICD-10-CM

## 2015-10-30 DIAGNOSIS — E1165 Type 2 diabetes mellitus with hyperglycemia: Secondary | ICD-10-CM | POA: Diagnosis present

## 2015-10-30 DIAGNOSIS — N289 Disorder of kidney and ureter, unspecified: Secondary | ICD-10-CM | POA: Diagnosis not present

## 2015-10-30 DIAGNOSIS — M79606 Pain in leg, unspecified: Secondary | ICD-10-CM | POA: Diagnosis not present

## 2015-10-30 DIAGNOSIS — Z7982 Long term (current) use of aspirin: Secondary | ICD-10-CM

## 2015-10-30 DIAGNOSIS — I10 Essential (primary) hypertension: Secondary | ICD-10-CM | POA: Diagnosis not present

## 2015-10-30 DIAGNOSIS — T380X5A Adverse effect of glucocorticoids and synthetic analogues, initial encounter: Secondary | ICD-10-CM | POA: Diagnosis present

## 2015-10-30 DIAGNOSIS — J80 Acute respiratory distress syndrome: Secondary | ICD-10-CM | POA: Diagnosis not present

## 2015-10-30 DIAGNOSIS — J181 Lobar pneumonia, unspecified organism: Secondary | ICD-10-CM

## 2015-10-30 DIAGNOSIS — R0489 Hemorrhage from other sites in respiratory passages: Secondary | ICD-10-CM | POA: Diagnosis not present

## 2015-10-30 DIAGNOSIS — E1122 Type 2 diabetes mellitus with diabetic chronic kidney disease: Secondary | ICD-10-CM

## 2015-10-30 DIAGNOSIS — Z978 Presence of other specified devices: Secondary | ICD-10-CM

## 2015-10-30 DIAGNOSIS — K529 Noninfective gastroenteritis and colitis, unspecified: Secondary | ICD-10-CM | POA: Diagnosis not present

## 2015-10-30 DIAGNOSIS — E11649 Type 2 diabetes mellitus with hypoglycemia without coma: Secondary | ICD-10-CM | POA: Diagnosis not present

## 2015-10-30 DIAGNOSIS — R7989 Other specified abnormal findings of blood chemistry: Secondary | ICD-10-CM

## 2015-10-30 DIAGNOSIS — Z91128 Patient's intentional underdosing of medication regimen for other reason: Secondary | ICD-10-CM

## 2015-10-30 DIAGNOSIS — D649 Anemia, unspecified: Secondary | ICD-10-CM | POA: Diagnosis present

## 2015-10-30 HISTORY — DX: Crohn's disease of both small and large intestine with fistula: K50.813

## 2015-10-30 LAB — GLUCOSE, CAPILLARY: Glucose-Capillary: 173 mg/dL — ABNORMAL HIGH (ref 65–99)

## 2015-10-30 LAB — COMPREHENSIVE METABOLIC PANEL
ALT: 17 U/L (ref 14–54)
AST: 27 U/L (ref 15–41)
Albumin: 3.3 g/dL — ABNORMAL LOW (ref 3.5–5.0)
Alkaline Phosphatase: 62 U/L (ref 38–126)
Anion gap: 12 (ref 5–15)
BUN: 27 mg/dL — AB (ref 6–20)
CHLORIDE: 102 mmol/L (ref 101–111)
CO2: 23 mmol/L (ref 22–32)
CREATININE: 1.36 mg/dL — AB (ref 0.44–1.00)
Calcium: 8.4 mg/dL — ABNORMAL LOW (ref 8.9–10.3)
GFR calc Af Amer: 50 mL/min — ABNORMAL LOW (ref >60)
GFR calc non Af Amer: 44 mL/min — ABNORMAL LOW (ref >60)
Glucose, Bld: 124 mg/dL — ABNORMAL HIGH (ref 65–99)
Potassium: 3.1 mmol/L — ABNORMAL LOW (ref 3.5–5.1)
SODIUM: 137 mmol/L (ref 135–145)
Total Bilirubin: 1.6 mg/dL — ABNORMAL HIGH (ref 0.3–1.2)
Total Protein: 6.5 g/dL (ref 6.5–8.1)

## 2015-10-30 LAB — CBC WITH DIFFERENTIAL/PLATELET
BASOS PCT: 0 %
Band Neutrophils: 5 %
Basophils Absolute: 0 10*3/uL (ref 0.0–0.1)
Blasts: 0 %
EOS ABS: 0 10*3/uL (ref 0.0–0.7)
EOS PCT: 0 %
HCT: 29.8 % — ABNORMAL LOW (ref 36.0–46.0)
Hemoglobin: 9.7 g/dL — ABNORMAL LOW (ref 12.0–15.0)
Lymphocytes Relative: 8 %
Lymphs Abs: 1.2 10*3/uL (ref 0.7–4.0)
MCH: 29.8 pg (ref 26.0–34.0)
MCHC: 32.6 g/dL (ref 30.0–36.0)
MCV: 91.4 fL (ref 78.0–100.0)
METAMYELOCYTES PCT: 2 %
MONO ABS: 0.7 10*3/uL (ref 0.1–1.0)
MYELOCYTES: 0 %
Monocytes Relative: 5 %
Neutro Abs: 12.8 10*3/uL — ABNORMAL HIGH (ref 1.7–7.7)
Neutrophils Relative %: 80 %
PLATELETS: 111 10*3/uL — AB (ref 150–400)
Promyelocytes Absolute: 0 %
RBC: 3.26 MIL/uL — ABNORMAL LOW (ref 3.87–5.11)
RDW: 19.3 % — ABNORMAL HIGH (ref 11.5–15.5)
WBC: 14.7 10*3/uL — AB (ref 4.0–10.5)
nRBC: 0 /100 WBC

## 2015-10-30 LAB — APTT: aPTT: 33 seconds (ref 24–36)

## 2015-10-30 LAB — LIPASE, BLOOD: Lipase: 17 U/L (ref 11–51)

## 2015-10-30 LAB — MAGNESIUM: Magnesium: 1.4 mg/dL — ABNORMAL LOW (ref 1.7–2.4)

## 2015-10-30 LAB — PROTIME-INR
INR: 0.98
Prothrombin Time: 13 seconds (ref 11.4–15.2)

## 2015-10-30 LAB — I-STAT CG4 LACTIC ACID, ED: LACTIC ACID, VENOUS: 2.66 mmol/L — AB (ref 0.5–1.9)

## 2015-10-30 LAB — LACTIC ACID, PLASMA: LACTIC ACID, VENOUS: 1 mmol/L (ref 0.5–1.9)

## 2015-10-30 MED ORDER — ONDANSETRON HCL 4 MG PO TABS
4.0000 mg | ORAL_TABLET | Freq: Four times a day (QID) | ORAL | Status: DC | PRN
  Administered 2015-11-02: 4 mg via ORAL
  Filled 2015-10-30: qty 1

## 2015-10-30 MED ORDER — ACETAMINOPHEN 650 MG RE SUPP: 650.0000 mg | Freq: Four times a day (QID) | RECTAL | Status: DC | PRN

## 2015-10-30 MED ORDER — IBUPROFEN 800 MG PO TABS
800.0000 mg | ORAL_TABLET | Freq: Once | ORAL | Status: AC
  Administered 2015-10-30: 800 mg via ORAL
  Filled 2015-10-30: qty 1

## 2015-10-30 MED ORDER — HYDROMORPHONE HCL 1 MG/ML IJ SOLN
0.5000 mg | INTRAMUSCULAR | Status: DC | PRN
  Administered 2015-10-31 – 2015-11-06 (×3): 1 mg via INTRAVENOUS
  Filled 2015-10-30 (×3): qty 1

## 2015-10-30 MED ORDER — INSULIN ASPART 100 UNIT/ML ~~LOC~~ SOLN
0.0000 [IU] | Freq: Every day | SUBCUTANEOUS | Status: DC
  Administered 2015-11-08: 3 [IU] via SUBCUTANEOUS
  Administered 2015-11-16: 2 [IU] via SUBCUTANEOUS

## 2015-10-30 MED ORDER — IOPAMIDOL (ISOVUE-300) INJECTION 61%
100.0000 mL | Freq: Once | INTRAVENOUS | Status: AC | PRN
  Administered 2015-10-30: 100 mL via INTRAVENOUS

## 2015-10-30 MED ORDER — ACETAMINOPHEN 325 MG PO TABS
650.0000 mg | ORAL_TABLET | Freq: Four times a day (QID) | ORAL | Status: DC | PRN
Start: 2015-10-30 — End: 2015-11-07
  Administered 2015-11-02 – 2015-11-06 (×9): 650 mg via ORAL
  Filled 2015-10-30 (×9): qty 2

## 2015-10-30 MED ORDER — MECLIZINE HCL 12.5 MG PO TABS
12.5000 mg | ORAL_TABLET | Freq: Three times a day (TID) | ORAL | Status: DC | PRN
  Administered 2015-11-02: 25 mg via ORAL
  Administered 2015-11-02: 12.5 mg via ORAL
  Filled 2015-10-30: qty 1
  Filled 2015-10-30 (×2): qty 2

## 2015-10-30 MED ORDER — ONDANSETRON HCL 4 MG/2ML IJ SOLN
4.0000 mg | Freq: Once | INTRAMUSCULAR | Status: AC
  Administered 2015-10-30: 4 mg via INTRAVENOUS
  Filled 2015-10-30: qty 2

## 2015-10-30 MED ORDER — ACETAMINOPHEN 325 MG PO TABS
650.0000 mg | ORAL_TABLET | Freq: Once | ORAL | Status: AC
  Administered 2015-10-30: 650 mg via ORAL
  Filled 2015-10-30: qty 2

## 2015-10-30 MED ORDER — SODIUM CHLORIDE 0.9% FLUSH
3.0000 mL | Freq: Two times a day (BID) | INTRAVENOUS | Status: DC
  Administered 2015-10-30 – 2015-11-27 (×47): 3 mL via INTRAVENOUS

## 2015-10-30 MED ORDER — CIPROFLOXACIN IN D5W 400 MG/200ML IV SOLN
400.0000 mg | Freq: Once | INTRAVENOUS | Status: AC
  Administered 2015-10-30: 400 mg via INTRAVENOUS
  Filled 2015-10-30: qty 200

## 2015-10-30 MED ORDER — SODIUM CHLORIDE 0.9 % IV BOLUS (SEPSIS)
1000.0000 mL | Freq: Once | INTRAVENOUS | Status: AC
  Administered 2015-10-30: 1000 mL via INTRAVENOUS

## 2015-10-30 MED ORDER — AMLODIPINE BESYLATE 10 MG PO TABS
10.0000 mg | ORAL_TABLET | Freq: Every day | ORAL | Status: DC
  Administered 2015-10-31 – 2015-11-12 (×13): 10 mg via ORAL
  Filled 2015-10-30 (×13): qty 1

## 2015-10-30 MED ORDER — INSULIN ASPART 100 UNIT/ML ~~LOC~~ SOLN
0.0000 [IU] | Freq: Three times a day (TID) | SUBCUTANEOUS | Status: DC
  Administered 2015-10-31 (×2): 2 [IU] via SUBCUTANEOUS
  Administered 2015-10-31: 3 [IU] via SUBCUTANEOUS
  Administered 2015-11-01: 1 [IU] via SUBCUTANEOUS
  Administered 2015-11-01 – 2015-11-03 (×3): 2 [IU] via SUBCUTANEOUS
  Administered 2015-11-03: 1 [IU] via SUBCUTANEOUS
  Administered 2015-11-04 – 2015-11-06 (×5): 2 [IU] via SUBCUTANEOUS
  Administered 2015-11-06: 1 [IU] via SUBCUTANEOUS
  Administered 2015-11-07: 2 [IU] via SUBCUTANEOUS
  Administered 2015-11-07: 3 [IU] via SUBCUTANEOUS
  Administered 2015-11-07: 2 [IU] via SUBCUTANEOUS
  Administered 2015-11-08 – 2015-11-09 (×2): 3 [IU] via SUBCUTANEOUS
  Administered 2015-11-09 – 2015-11-10 (×4): 1 [IU] via SUBCUTANEOUS
  Administered 2015-11-11: 5 [IU] via SUBCUTANEOUS
  Administered 2015-11-11 – 2015-11-12 (×3): 2 [IU] via SUBCUTANEOUS
  Administered 2015-11-12: 1 [IU] via SUBCUTANEOUS
  Administered 2015-11-12 – 2015-11-14 (×5): 2 [IU] via SUBCUTANEOUS
  Administered 2015-11-15: 3 [IU] via SUBCUTANEOUS
  Administered 2015-11-16: 2 [IU] via SUBCUTANEOUS
  Administered 2015-11-16: 1 [IU] via SUBCUTANEOUS
  Administered 2015-11-16 – 2015-11-17 (×3): 2 [IU] via SUBCUTANEOUS
  Administered 2015-11-17: 3 [IU] via SUBCUTANEOUS

## 2015-10-30 MED ORDER — POTASSIUM CHLORIDE IN NACL 40-0.9 MEQ/L-% IV SOLN
INTRAVENOUS | Status: AC
  Administered 2015-10-30: 100 mL/h via INTRAVENOUS
  Filled 2015-10-30: qty 1000

## 2015-10-30 MED ORDER — OXYCODONE-ACETAMINOPHEN 5-325 MG PO TABS
1.0000 | ORAL_TABLET | Freq: Four times a day (QID) | ORAL | Status: DC | PRN
  Administered 2015-10-31 – 2015-11-07 (×7): 2 via ORAL
  Filled 2015-10-30 (×7): qty 2

## 2015-10-30 MED ORDER — HYDRALAZINE HCL 20 MG/ML IJ SOLN
10.0000 mg | INTRAMUSCULAR | Status: DC | PRN
  Administered 2015-10-31 – 2015-11-25 (×6): 10 mg via INTRAVENOUS
  Filled 2015-10-30 (×6): qty 1

## 2015-10-30 MED ORDER — METRONIDAZOLE IN NACL 5-0.79 MG/ML-% IV SOLN
500.0000 mg | Freq: Three times a day (TID) | INTRAVENOUS | Status: DC
  Administered 2015-10-31 – 2015-11-02 (×7): 500 mg via INTRAVENOUS
  Filled 2015-10-30 (×6): qty 100

## 2015-10-30 MED ORDER — CIPROFLOXACIN IN D5W 400 MG/200ML IV SOLN
400.0000 mg | Freq: Two times a day (BID) | INTRAVENOUS | Status: DC
  Administered 2015-10-31 – 2015-11-02 (×5): 400 mg via INTRAVENOUS
  Filled 2015-10-30 (×6): qty 200

## 2015-10-30 MED ORDER — MORPHINE SULFATE (PF) 4 MG/ML IV SOLN
4.0000 mg | Freq: Once | INTRAVENOUS | Status: AC
Start: 2015-10-30 — End: 2015-10-30
  Administered 2015-10-30: 4 mg via INTRAVENOUS
  Filled 2015-10-30: qty 1

## 2015-10-30 MED ORDER — METHYLPREDNISOLONE SODIUM SUCC 40 MG IJ SOLR
40.0000 mg | Freq: Two times a day (BID) | INTRAMUSCULAR | Status: DC
  Administered 2015-10-31 – 2015-11-01 (×4): 40 mg via INTRAVENOUS
  Filled 2015-10-30 (×5): qty 1

## 2015-10-30 MED ORDER — METRONIDAZOLE IN NACL 5-0.79 MG/ML-% IV SOLN
500.0000 mg | Freq: Once | INTRAVENOUS | Status: AC
Start: 2015-10-30 — End: 2015-10-30
  Administered 2015-10-30: 500 mg via INTRAVENOUS
  Filled 2015-10-30: qty 100

## 2015-10-30 MED ORDER — MORPHINE SULFATE (PF) 4 MG/ML IV SOLN
4.0000 mg | Freq: Once | INTRAVENOUS | Status: AC
  Administered 2015-10-30: 4 mg via INTRAVENOUS
  Filled 2015-10-30: qty 1

## 2015-10-30 MED ORDER — ONDANSETRON HCL 4 MG/2ML IJ SOLN
4.0000 mg | Freq: Four times a day (QID) | INTRAMUSCULAR | Status: DC | PRN
Start: 2015-10-30 — End: 2015-11-08
  Administered 2015-11-07: 4 mg via INTRAVENOUS
  Filled 2015-10-30: qty 2

## 2015-10-30 MED ORDER — PROMETHAZINE HCL 25 MG PO TABS: 25.0000 mg | ORAL_TABLET | Freq: Four times a day (QID) | ORAL | Status: DC | PRN

## 2015-10-30 MED ORDER — FAMOTIDINE IN NACL 20-0.9 MG/50ML-% IV SOLN
20.0000 mg | Freq: Two times a day (BID) | INTRAVENOUS | Status: DC
  Administered 2015-10-30 – 2015-11-01 (×5): 20 mg via INTRAVENOUS
  Filled 2015-10-30 (×7): qty 50

## 2015-10-30 MED ORDER — POTASSIUM CHLORIDE CRYS ER 20 MEQ PO TBCR
40.0000 meq | EXTENDED_RELEASE_TABLET | Freq: Once | ORAL | Status: AC
  Administered 2015-10-30: 40 meq via ORAL
  Filled 2015-10-30: qty 2

## 2015-10-30 MED ORDER — METHYLPREDNISOLONE SODIUM SUCC 40 MG IJ SOLR
40.0000 mg | Freq: Once | INTRAMUSCULAR | Status: AC
  Administered 2015-10-30: 40 mg via INTRAVENOUS
  Filled 2015-10-30: qty 1

## 2015-10-30 MED ORDER — METOPROLOL TARTRATE 50 MG PO TABS
50.0000 mg | ORAL_TABLET | Freq: Two times a day (BID) | ORAL | Status: DC
  Administered 2015-10-30 – 2015-11-14 (×29): 50 mg via ORAL
  Filled 2015-10-30 (×30): qty 1

## 2015-10-30 NOTE — Progress Notes (Signed)
   Spoke to Dr. Tyrone Nine in [facility] - patient to be admitted. Advised Solumedrol 40 mg iv q 12 and cipro and metronidazole. I will see her tomorrow after admission.  Gatha Mayer, MD, [facility] [phone] (pager) [phone] after 5 PM, weekends and holidays  11/04/2015 7:10 PM

## 2015-10-30 NOTE — H&P (Addendum)
History and Physical    Nancy Acevedo [id number] DOB: 03-20-1960 DOA: 10/21/2015  PCP: Binnie Rail, MD   Patient coming from: Home, by way of [facility]  Chief Complaint: Fever, N/V/D  HPI: Nancy Acevedo is a 55 y.o. female with medical history significant for hypertension, chronic kidney disease stage II, OSA, GERD, and recent diagnosis of Crohn's colitis managed with prednisone who presented to the [facility] emergency department with 2-3 days of fevers, nausea, vomiting, and nonbloody diarrhea. Patient reports the insidious development of abdominal pain approximately 6 months ago and underwent a colonoscopy in July of this year with a large ulcer at the terminal ileum and several punctate deep ulcers in the right colon, with pathology consistent with Crohn's. She has been taking daily prednisone since that time, but unfortunately developed severe mid abdominal pain which rapidly progressed to include fevers, nausea, nonbloody vomiting, and nonbloody diarrhea. Patient reports vomiting only once or twice and describes the nausea has mild. She describes her abdominal pain as severe, cramping in character, localized to the lower abdomen, and associated with loss of appetite. She denies any chest pain, palpitations, dyspnea, lower extremity edema, rash, or wound.  [facility] ED Course: Upon arrival to the [facility] ED, patient is found to be febrile to 38.8 C, saturating 100% on room air, tachycardic in the 120s, and hypertensive to 190/110. Chemistry panel was notable for a serum potassium of 3.1, BUN 27, and creatinine 1.36, up from an apparent baseline of 1.1. CBC features a leukocytosis to 14,700 with mild left shift, a stable normocytic anemia with hemoglobin of 9.7, and a new thrombocytopenia with platelets of 111,000. Lactic acid returned elevated to 2.66. Chest x-ray features cardiomegaly with pulmonary vascular congestion. CT of the abdomen and pelvis was obtained and demonstrates a  long segment of bowel inflammation extending from the terminal ileum to the proximal sigmoid colon, consistent with active Crohn's, and without abscess. Also noted on CT is finding suspicious for a fistula involving the terminal ileum and an adjacent loop of small bowel. Gastroenterology was consulted by the ED physician and advised a medical admission with IV steroids and antibiotics. Patient was treated with 40 mg IV Solu-Medrol, Cipro, and Flagyl. She received 3 L of normal saline with improvement in the tachycardia. Symptomatic care was provided with morphine, Tylenol, Advil, and Zofran. Patient remained hypertensive and mildly tachycardic and was transferred to [facility] for admission to the telemetry unit.  Review of Systems:  All other systems reviewed and apart from HPI, are negative.  Past Medical History:  Diagnosis Date  . Candidiasis of mouth    History   . Complication of anesthesia    " SLOW WAKING UP "  . Diabetes mellitus, type 2 ([place]) dx 01/2011   borderline - no current medications, diet controlled  . EP (ectopic pregnancy)   . GERD (gastroesophageal reflux disease)    no meds, diet controlled  . Gout    hx - no current problem  . Headaches, cluster    otc med prn  . History of hyperglycemia 1993  . Hypertension   . OSA (obstructive sleep apnea)    uses CPAP nightly  . Sleep apnea    Uses CPAP every night  . Syncope 02/09/15  . Syncope 02/10/2015    Past Surgical History:  Procedure Laterality Date  . APPENDECTOMY    . CARDIAC CATHETERIZATION N/A 02/11/2015   Procedure: Left Heart Cath and Coronary Angiography;  Surgeon: Troy Sine,  MD;  Location: [facility];  Service: Cardiovascular;  Laterality: N/A;- Normal results  . COLONOSCOPY  2016   Ardis Hughs - Normal  . ECTOPIC PREGNANCY SURGERY     left fallopian tube  . MYRINGOTOMY WITH TUBE PLACEMENT    . TONSILLECTOMY    . TONSILLECTOMY AND ADENOIDECTOMY  2012  . TUBAL LIGATION       reports  that she has never smoked. She has never used smokeless tobacco. She reports that she does not drink alcohol or use drugs.  Allergies  Allergen Reactions  . Lexapro [Escitalopram] Itching  . Losartan   . Spironolactone     rash    Family History  Problem Relation Age of Onset  . Arthritis Sister   . Diabetes Mother   . Hypertension Mother   . Colon cancer Neg Hx   . Rectal cancer Neg Hx   . Stomach cancer Neg Hx   . Esophageal cancer Neg Hx      Prior to Admission medications   Medication Sig Start Date End Date Taking? Authorizing Provider  glucose blood test strip 1 each by Other route as needed for other. Use as instructed   Yes Historical Provider, MD  amLODipine (NORVASC) 10 MG tablet Take 1 tablet (10 mg total) by mouth daily. 02/10/15   Nita Sells, MD  aspirin (GOODSENSE ASPIRIN) 81 MG chewable tablet Chew 81 mg by mouth. 10/18/14   Historical Provider, MD  Hydrocodone-Acetaminophen 7.5-300 MG TABS Take 1-2 tabs by mouth every 4-6 hours as needed for pain 10/28/15   Milus Banister, MD  meclizine (ANTIVERT) 25 MG tablet Take 0.5-1 tablets (12.5-25 mg total) by mouth 3 (three) times daily as needed for dizziness. 10/26/15   Binnie Rail, MD  metoprolol succinate (TOPROL-XL) 100 MG 24 hr tablet Take 1 tablet (100 mg total) by mouth daily. Take with or immediately following a meal. 12/01/14   Binnie Rail, MD  ondansetron (ZOFRAN ODT) 4 MG disintegrating tablet Take 1 tablet (4 mg total) by mouth every 8 (eight) hours as needed for nausea or vomiting. 10/02/15   Binnie Rail, MD  oxyCODONE-acetaminophen (PERCOCET) 5-325 MG tablet Take 1-2 tablets by mouth every 6 (six) hours as needed. 10/16/15   Veryl Speak, MD  potassium chloride SA (K-DUR,KLOR-CON) 20 MEQ tablet Take 2 tablets (40 mEq total) by mouth daily. 10/04/15   Binnie Rail, MD  predniSONE (DELTASONE) 20 MG tablet Take 3 tablets daily for 3 days, then 2 tablets daily for 3 days, then 1 tablet daily for 3 days,  then resume your previous dose. 10/16/15   Veryl Speak, MD  promethazine (PHENERGAN) 25 MG tablet Take 1 tablet (25 mg total) by mouth every 6 (six) hours as needed for nausea or vomiting. 09/29/15   Dalia Heading, PA-C    Physical Exam: Vitals:   11/14/2015 2015 11/02/2015 2030 10/21/2015 2045 11/15/2015 2155  BP: 161/99 (!) 171/101 159/94 (!) 172/99  Pulse: 105 108 110 (!) 110  Resp: 13 (!) 0 19 20  Temp:    99.3 F (37.4 C)  TempSrc:      SpO2: 97% 97% 94% 94%  Weight:      Height:          Constitutional: NAD, calm, in apparent discomfort. Eyes: PERTLA, lids and conjunctivae normal ENMT: Mucous membranes are dry. Posterior pharynx clear of any exudate or lesions.   Neck: normal, supple, no masses, no thyromegaly Respiratory: clear to auscultation bilaterally, no wheezing,  no crackles. Normal respiratory effort.    Cardiovascular: S1 & S2 heard, regular rate and rhythm. No extremity edema. No significant JVD. Abdomen: No distension, tender throughout, particularly lower abdomen, no masses palpated. Bowel sounds hypoactive.  Musculoskeletal: no clubbing / cyanosis. No joint deformity upper and lower extremities. Normal muscle tone.  Skin: no significant rashes, lesions, ulcers. Warm, dry, well-perfused. Neurologic: CN 2-12 grossly intact. Sensation intact, DTR normal. Strength 5/5 in all 4 limbs.  Psychiatric: Normal judgment and insight. Alert and oriented x 3. Normal mood and affect.     Labs on Admission: I have personally reviewed following labs and imaging studies  CBC:  Recent Labs Lab 11/16/2015 1510  WBC 14.7*  NEUTROABS 12.8*  HGB 9.7*  HCT 29.8*  MCV 91.4  PLT 99991111*   Basic Metabolic Panel:  Recent Labs Lab 10/29/2015 1510  NA 137  K 3.1*  CL 102  CO2 23  GLUCOSE 124*  BUN 27*  CREATININE 1.36*  CALCIUM 8.4*   GFR: Estimated Creatinine Clearance: 51 mL/min (by C-G formula based on SCr of 1.36 mg/dL (H)). Liver Function Tests:  Recent Labs Lab  11/11/2015 1510  AST 27  ALT 17  ALKPHOS 62  BILITOT 1.6*  PROT 6.5  ALBUMIN 3.3*    Recent Labs Lab 11/14/2015 1510  LIPASE 17   No results for input(s): AMMONIA in the last 168 hours. Coagulation Profile: No results for input(s): INR, PROTIME in the last 168 hours. Cardiac Enzymes: No results for input(s): CKTOTAL, CKMB, CKMBINDEX, TROPONINI in the last 168 hours. BNP (last 3 results) No results for input(s): PROBNP in the last 8760 hours. HbA1C: No results for input(s): HGBA1C in the last 72 hours. CBG: No results for input(s): GLUCAP in the last 168 hours. Lipid Profile: No results for input(s): CHOL, HDL, LDLCALC, TRIG, CHOLHDL, LDLDIRECT in the last 72 hours. Thyroid Function Tests: No results for input(s): TSH, T4TOTAL, FREET4, T3FREE, THYROIDAB in the last 72 hours. Anemia Panel: No results for input(s): VITAMINB12, FOLATE, FERRITIN, TIBC, IRON, RETICCTPCT in the last 72 hours. Urine analysis:    Component Value Date/Time   COLORURINE YELLOW 09/29/2015 1140   APPEARANCEUR CLEAR 09/29/2015 1140   LABSPEC 1.009 09/29/2015 1140   PHURINE 6.5 09/29/2015 1140   GLUCOSEU NEGATIVE 09/29/2015 1140   GLUCOSEU NEGATIVE 04/15/2015 1209   HGBUR NEGATIVE 09/29/2015 1140   BILIRUBINUR NEGATIVE 09/29/2015 1140   KETONESUR NEGATIVE 09/29/2015 1140   PROTEINUR NEGATIVE 09/29/2015 1140   UROBILINOGEN 0.2 04/15/2015 1209   NITRITE NEGATIVE 09/29/2015 1140   LEUKOCYTESUR NEGATIVE 09/29/2015 1140   Sepsis Labs: @LABRCNTIP (procalcitonin:4,lacticidven:4) )No results found for this or any previous visit (from the past 240 hour(s)).   Radiological Exams on Admission: Dg Chest 2 View  Result Date: 10/31/2015 CLINICAL DATA:  Chest pain and coughing for 2 weeks. EXAM: CHEST  2 VIEW COMPARISON:  02/09/2015 FINDINGS: AP and lateral views of the chest show low volumes. The cardio pericardial silhouette is enlarged. There is pulmonary vascular congestion without overt pulmonary edema.  The visualized bony structures of the thorax are intact. Telemetry leads overlie the chest. IMPRESSION: Cardiomegaly with pulmonary vascular congestion. Electronically Signed   By: Misty Stanley M.D.   On: 11/17/2015 17:37   Ct Abdomen Pelvis W Contrast  Result Date: 11/04/2015 CLINICAL DATA:  Crohn's disease. Diffuse abdominal pain. Nausea, vomiting and diarrhea. EXAM: CT ABDOMEN AND PELVIS WITH CONTRAST TECHNIQUE: Multidetector CT imaging of the abdomen and pelvis was performed using the standard protocol following bolus administration  of intravenous contrast. CONTRAST:  180mL ISOVUE-300 IOPAMIDOL (ISOVUE-300) INJECTION 61% COMPARISON:  MR enterography 10/27/2015 FINDINGS: Lower chest: No pulmonary nodules. No visible pleural or pericardial effusion. Hepatobiliary: Normal hepatic size and contours without focal liver lesion. No perihepatic ascites. No intra- or extrahepatic biliary dilatation. Normal gallbladder. Pancreas: Normal pancreatic contours and enhancement. No peripancreatic fluid collection or pancreatic ductal dilatation. Spleen: Normal. Adrenals/Urinary Tract: Normal adrenal glands. No hydronephrosis or solid renal mass. Stomach/Bowel: There is long segment bowel wall thickening and surrounding inflammatory stranding extending from the terminal ileum to the junction of the descending and sigmoid colon. There is again seen a suspected fistula between the terminal ileum and an adjacent loop of right lower quadrant bowel (sagittal image 43, coronal image 51). The appendix is not visualized. No abscess or focal fluid collection. No dilated small bowel. No evidence of focal small bowel inflammation. Vascular/Lymphatic: Normal course and caliber of the major abdominal vessels. No abdominal or pelvic adenopathy. Reproductive: Hyperenhancing focus at the uterine fundus is likely a small fibroid. Ovaries are normal. Musculoskeletal: Multilevel osteophytosis and facet arthrosis. No bony spinal canal  stenosis. No lytic or blastic lesions. Normal visualized extrathoracic and extraperitoneal soft tissues. Other: No contributory non-categorized findings. IMPRESSION: 1. Long segment bowel inflammation extending from the terminal ileum to the junction of the descending and sigmoid colon, consistent with active Crohn's disease. No abscess or fluid collection. 2. 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 Electronically Signed   By: Ulyses Jarred M.D.   On: 11/02/2015 17:49    EKG: Ordered and pending.  Assessment/Plan  1. Crohn's colitis with flare, sepsis  - GI is consulting and much appreciated  - Pt diagnosed with colonoscopy in July 2017; endoscopy and pathology reports available in Epic  - She has been on prednisone prior to admission  - She presented to [facility] with sepsis; CT abd/pelvis with inflammation from TI to prox sigmoid with suspected fistula involving TI and adjacent SB - She has been started on Solu-Medrol 40 mg IV q12h, IV Cipro and IV Flagyl per GI recs  - She received 30 cc/kg NS bolus; blood cultures incubating  - Trend lactate, continue IVF hydration  - Symptomatic care with prn analgesics and antiemetics   2. Hypertension with hypertensive urgency - BP 190/110 range at [facility]; pain is likely contributing  - Managed with Toprol and Norvasc at home; will convert Toprol to Lopressor for now and continue that and Norvasc as tolerated  - Hydralazine IVP's available prn   3. Hypokalemia  - Serum potassium 3.1 on admission; likely d/t GI-losses  - 40 mEq K-Dur given on admission; KCl added to IVF  - Check mag level and replete prn  - Repeat chem panel in am   4. CKD stage II  - SCr 1.36 on admission, up from 1.10 earlier this month  - She meets sepsis criteria on admission and received 30 cc/kg bolus  - Continuing IVF overnight  - Repeat chem panel in am   5. GERD - EGD with esophagitis  - She is  not taking PPI or H2-blocker at home - She hasn't eaten in 2 days by time of admission, so Pepcid started for GI protection   6. Type II DM  - Recent A1c 6.6%, had previously been wnl  - Likely steroid-induced  - Check CBG with meals and qHS  - Start with a low-intensity SSI correctional only   7. Normocytic anemia,  thrombocytopenia  - Hgb 9.7 on admission and stable - There is a new thrombocytopenia with platelets 111,000 on admission; likely d/t sepsis  - No active bleeding, will monitor    DVT prophylaxis: SCD's  Code Status: Full  Family Communication: Husband updated at bedside Disposition Plan: Admit to telemetry Consults called: Dr. Carlean Purl of GI  Admission status: Inpatient    Vianne Bulls, MD [facility] Pager [phone]  If 7PM-7AM, please contact night-coverage [email or web address] Password [email or web address]  11/10/2015, 10:41 PM

## 2015-10-30 NOTE — ED Notes (Addendum)
Pt with multiple episodes of diarrhea with abd pain and fever since yesterday. No blood in stool per pt. Hx of Crohns disease.

## 2015-10-30 NOTE — ED Notes (Signed)
Patient had blood cultures x 2 drawn with IV sticks here. Cultures taken to the lab. The patient has #1 culture drawn at 1510 and second done at 1600  -

## 2015-10-30 NOTE — ED Provider Notes (Signed)
[facility] DEPT [facility] Provider Note   CSN: [id number] Arrival date & time: 11/04/2015  1412     History   Chief Complaint Chief Complaint  Patient presents with  . Abdominal Pain    HPI Nancy Acevedo is a 55 y.o. female.  56 yo F with a chief complaint of abdominal pain fever and cough. This been going on since yesterday. Patient went out of town to a funeral and consent has been feeling much worse than her baseline. Patient was recently diagnosed with Crohn's disease about 4 months ago. Is currently taking steroids for this. Has not had any complications yet. Had one episode of vomiting yesterday. Describes the pain is sharp in the epigastric and upper abdomen. Having multiple loose stools since yesterday. Difficulty tolerating by mouth. Feels really weak.   The history is provided by the patient.  Abdominal Pain   This is a recurrent problem. The current episode started yesterday. The problem occurs constantly. The problem has been gradually worsening. The pain is located in the epigastric region. The pain is at a severity of 9/10. The pain is severe. Associated symptoms include fever, diarrhea, nausea and vomiting. Pertinent negatives include dysuria, headaches, arthralgias and myalgias. Nothing aggravates the symptoms. Nothing relieves the symptoms. Past workup includes GI consult. Her past medical history is significant for Crohn's disease.    Past Medical History:  Diagnosis Date  . Candidiasis of mouth    History   . Complication of anesthesia    " SLOW WAKING UP "  . Diabetes mellitus, type 2 ([place]) dx 01/2011   borderline - no current medications, diet controlled  . EP (ectopic pregnancy)   . GERD (gastroesophageal reflux disease)    no meds, diet controlled  . Gout    hx - no current problem  . Headaches, cluster    otc med prn  . History of hyperglycemia 1993  . Hypertension   . OSA (obstructive sleep apnea)    uses CPAP nightly  . Sleep apnea    Uses CPAP  every night  . Syncope 02/09/15  . Syncope 02/10/2015    Patient Active Problem List   Diagnosis Date Noted  . Crohn's disease of colon with complication ([place]) [id number]  . Diarrhea 10/03/2015  . Crohn's colitis ([place]) 08/07/2015  . B12 deficiency 04/15/2015  . Gastroesophageal reflux disease with esophagitis 04/15/2015  . BPPV (benign paroxysmal positional vertigo) 02/19/2015  . Abnormal nuclear stress test   . Faintness   . Chest pain 02/09/2015  . Hypokalemia 02/09/2015  . Syncope 02/09/2015  . Greater trochanteric bursitis of left hip 01/15/2015  . Left hip pain 12/01/2014  . Head or neck swelling, mass, or lump 12/01/2014  . Dysphagia 10/14/2014  . Odynophagia 10/14/2014  . Abnormal esophagram 10/14/2014  . Oral candidiasis 10/14/2014  . Pain in joint, ankle and foot 10/21/2013  . Muscle spasm of back 02/06/2013  . Nonallopathic lesion of thoracic region 02/06/2013  . Rash, drug 11/20/2012  . OSA (obstructive sleep apnea)   . Depression   . Obese   . Abnormal LFTs (liver function tests) 02/17/2011  . Diabetes mellitus, type 2 ([place]) 12/08/2010  . Hypertension   . Headaches, cluster     Past Surgical History:  Procedure Laterality Date  . APPENDECTOMY    . CARDIAC CATHETERIZATION N/A 02/11/2015   Procedure: Left Heart Cath and Coronary Angiography;  Surgeon: Troy Sine, MD;  Location: [facility];  Service: Cardiovascular;  Laterality: N/A;- Normal results  .  COLONOSCOPY  2016   Ardis Hughs - Normal  . ECTOPIC PREGNANCY SURGERY     left fallopian tube  . MYRINGOTOMY WITH TUBE PLACEMENT    . TONSILLECTOMY    . TONSILLECTOMY AND ADENOIDECTOMY  2012  . TUBAL LIGATION      OB History    No data available       Home Medications    Prior to Admission medications   Medication Sig Start Date End Date Taking? Authorizing Provider  glucose blood test strip 1 each by Other route as needed for other. Use as instructed   Yes Historical Provider, MD    amLODipine (NORVASC) 10 MG tablet Take 1 tablet (10 mg total) by mouth daily. 02/10/15   Nita Sells, MD  aspirin (GOODSENSE ASPIRIN) 81 MG chewable tablet Chew 81 mg by mouth. 10/18/14   Historical Provider, MD  Hydrocodone-Acetaminophen 7.5-300 MG TABS Take 1-2 tabs by mouth every 4-6 hours as needed for pain 10/28/15   Milus Banister, MD  meclizine (ANTIVERT) 25 MG tablet Take 0.5-1 tablets (12.5-25 mg total) by mouth 3 (three) times daily as needed for dizziness. 10/26/15   Binnie Rail, MD  metoprolol succinate (TOPROL-XL) 100 MG 24 hr tablet Take 1 tablet (100 mg total) by mouth daily. Take with or immediately following a meal. 12/01/14   Binnie Rail, MD  ondansetron (ZOFRAN ODT) 4 MG disintegrating tablet Take 1 tablet (4 mg total) by mouth every 8 (eight) hours as needed for nausea or vomiting. 10/02/15   Binnie Rail, MD  oxyCODONE-acetaminophen (PERCOCET) 5-325 MG tablet Take 1-2 tablets by mouth every 6 (six) hours as needed. 10/16/15   Veryl Speak, MD  potassium chloride SA (K-DUR,KLOR-CON) 20 MEQ tablet Take 2 tablets (40 mEq total) by mouth daily. 10/04/15   Binnie Rail, MD  predniSONE (DELTASONE) 20 MG tablet Take 3 tablets daily for 3 days, then 2 tablets daily for 3 days, then 1 tablet daily for 3 days, then resume your previous dose. 10/16/15   Veryl Speak, MD  promethazine (PHENERGAN) 25 MG tablet Take 1 tablet (25 mg total) by mouth every 6 (six) hours as needed for nausea or vomiting. 09/29/15   Dalia Heading, PA-C    Family History Family History  Problem Relation Age of Onset  . Arthritis Sister   . Diabetes Mother   . Hypertension Mother   . Colon cancer Neg Hx   . Rectal cancer Neg Hx   . Stomach cancer Neg Hx   . Esophageal cancer Neg Hx     Social History Social History  Substance Use Topics  . Smoking status: Never Smoker  . Smokeless tobacco: Never Used  . Alcohol use No     Allergies   Lexapro [escitalopram]; Losartan; and  Spironolactone   Review of Systems Review of Systems  Constitutional: Positive for fever. Negative for chills.  HENT: Positive for congestion. Negative for rhinorrhea.   Eyes: Negative for redness and visual disturbance.  Respiratory: Positive for cough. Negative for shortness of breath and wheezing.   Cardiovascular: Negative for chest pain and palpitations.  Gastrointestinal: Positive for abdominal pain, diarrhea, nausea and vomiting.  Genitourinary: Negative for dysuria and urgency.  Musculoskeletal: Negative for arthralgias and myalgias.  Skin: Negative for pallor and wound.  Neurological: Negative for dizziness and headaches.     Physical Exam Updated Vital Signs BP 156/90   Pulse 112   Temp 102.8 F (39.3 C) (Oral)   Resp (!) 27  Ht 5\' 4"  (1.626 m)   Wt 191 lb (86.6 kg)   SpO2 95%   BMI 32.79 kg/m   Physical Exam  Constitutional: She is oriented to person, place, and time. She appears well-developed and well-nourished. No distress.  HENT:  Head: Normocephalic and atraumatic.  Eyes: EOM are normal. Pupils are equal, round, and reactive to light.  Neck: Normal range of motion. Neck supple.  Cardiovascular: Normal rate and regular rhythm.  Exam reveals no gallop and no friction rub.   No murmur heard. Pulmonary/Chest: Effort normal. She has no wheezes. She has no rales.  Abdominal: Soft. She exhibits no distension and no mass. There is tenderness (diffuse, upper). There is no guarding.  Musculoskeletal: She exhibits no edema or tenderness.  Neurological: She is alert and oriented to person, place, and time.  Skin: Skin is warm and dry. She is not diaphoretic.  Psychiatric: She has a normal mood and affect. Her behavior is normal.  Nursing note and vitals reviewed.    ED Treatments / Results  Labs (all labs ordered are listed, but only abnormal results are displayed) Labs Reviewed  CBC WITH DIFFERENTIAL/PLATELET - Abnormal; Notable for the following:        Result Value   WBC 14.7 (*)    RBC 3.26 (*)    Hemoglobin 9.7 (*)    HCT 29.8 (*)    RDW 19.3 (*)    Platelets 111 (*)    Neutro Abs 12.8 (*)    All other components within normal limits  COMPREHENSIVE METABOLIC PANEL - Abnormal; Notable for the following:    Potassium 3.1 (*)    Glucose, Bld 124 (*)    BUN 27 (*)    Creatinine, Ser 1.36 (*)    Calcium 8.4 (*)    Albumin 3.3 (*)    Total Bilirubin 1.6 (*)    GFR calc non Af Amer 44 (*)    GFR calc Af Amer 50 (*)    All other components within normal limits  I-STAT CG4 LACTIC ACID, ED - Abnormal; Notable for the following:    Lactic Acid, Venous 2.66 (*)    All other components within normal limits  C DIFFICILE QUICK SCREEN W PCR REFLEX  LIPASE, BLOOD  I-STAT CG4 LACTIC ACID, ED    EKG  EKG Interpretation None       Radiology Dg Chest 2 View  Result Date: 10/25/2015 CLINICAL DATA:  Chest pain and coughing for 2 weeks. EXAM: CHEST  2 VIEW COMPARISON:  02/09/2015 FINDINGS: AP and lateral views of the chest show low volumes. The cardio pericardial silhouette is enlarged. There is pulmonary vascular congestion without overt pulmonary edema. The visualized bony structures of the thorax are intact. Telemetry leads overlie the chest. IMPRESSION: Cardiomegaly with pulmonary vascular congestion. Electronically Signed   By: Misty Stanley M.D.   On: 11/07/2015 17:37   Ct Abdomen Pelvis W Contrast  Result Date: 11/17/2015 CLINICAL DATA:  Crohn's disease. Diffuse abdominal pain. Nausea, vomiting and diarrhea. EXAM: CT ABDOMEN AND PELVIS WITH CONTRAST TECHNIQUE: Multidetector CT imaging of the abdomen and pelvis was performed using the standard protocol following bolus administration of intravenous contrast. CONTRAST:  153mL ISOVUE-300 IOPAMIDOL (ISOVUE-300) INJECTION 61% COMPARISON:  MR enterography 10/27/2015 FINDINGS: Lower chest: No pulmonary nodules. No visible pleural or pericardial effusion. Hepatobiliary: Normal hepatic size  and contours without focal liver lesion. No perihepatic ascites. No intra- or extrahepatic biliary dilatation. Normal gallbladder. Pancreas: Normal pancreatic contours and enhancement. No peripancreatic fluid  collection or pancreatic ductal dilatation. Spleen: Normal. Adrenals/Urinary Tract: Normal adrenal glands. No hydronephrosis or solid renal mass. Stomach/Bowel: There is long segment bowel wall thickening and surrounding inflammatory stranding extending from the terminal ileum to the junction of the descending and sigmoid colon. There is again seen a suspected fistula between the terminal ileum and an adjacent loop of right lower quadrant bowel (sagittal image 43, coronal image 51). The appendix is not visualized. No abscess or focal fluid collection. No dilated small bowel. No evidence of focal small bowel inflammation. Vascular/Lymphatic: Normal course and caliber of the major abdominal vessels. No abdominal or pelvic adenopathy. Reproductive: Hyperenhancing focus at the uterine fundus is likely a small fibroid. Ovaries are normal. Musculoskeletal: Multilevel osteophytosis and facet arthrosis. No bony spinal canal stenosis. No lytic or blastic lesions. Normal visualized extrathoracic and extraperitoneal soft tissues. Other: No contributory non-categorized findings. IMPRESSION: 1. Long segment bowel inflammation extending from the terminal ileum to the junction of the descending and sigmoid colon, consistent with active Crohn's disease. No abscess or fluid collection. 2. 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 Electronically Signed   By: Ulyses Jarred M.D.   On: 10/22/2015 17:49    Procedures Procedures (including critical care time)  Medications Ordered in ED Medications  ciprofloxacin (CIPRO) IVPB 400 mg (400 mg Intravenous New Bag/Given 11/05/2015 1947)  metroNIDAZOLE (FLAGYL) IVPB 500 mg (500 mg Intravenous New Bag/Given  10/23/2015 1947)  sodium chloride 0.9 % bolus 1,000 mL (not administered)  ibuprofen (ADVIL,MOTRIN) tablet 800 mg (not administered)  morphine 4 MG/ML injection 4 mg (not administered)  ondansetron (ZOFRAN) injection 4 mg (not administered)  acetaminophen (TYLENOL) tablet 650 mg (650 mg Oral Given 10/29/2015 1439)  sodium chloride 0.9 % bolus 1,000 mL (0 mLs Intravenous Stopped 11/12/2015 1735)  morphine 4 MG/ML injection 4 mg (4 mg Intravenous Given 10/25/2015 1530)  ondansetron (ZOFRAN) injection 4 mg (4 mg Intravenous Given 10/22/2015 1530)  iopamidol (ISOVUE-300) 61 % injection 100 mL (100 mLs Intravenous Contrast Given 10/23/2015 1651)  sodium chloride 0.9 % bolus 1,000 mL (1,000 mLs Intravenous New Bag/Given 10/28/2015 1949)  methylPREDNISolone sodium succinate (SOLU-MEDROL) 40 mg/mL injection 40 mg (40 mg Intravenous Given 10/19/2015 1947)     Initial Impression / Assessment and Plan / ED Course  I have reviewed the triage vital signs and the nursing notes.  Pertinent labs & imaging results that were available during my care of the patient were reviewed by me and considered in my medical decision making (see chart for details).  Clinical Course    55 yo F With a chief complaint of abdominal pain diarrhea and cough. Patient was recently diagnosed with Crohn's disease. Screen for C. difficile. Obtain a chest x-ray CT of abdomen and pelvis contrast.  CT scan was concerning for a Crohn's flare as well as a before seen and no enteric fistula. I discussed the case with Dr. Carlean Purl, GI, recommend starting the patient on Cipro Flagyl and 40 mg of Solu-Medrol twice a day. As the patient is continued to be tachycardic he recommended admission and she will likely be started on Remicade in the hospital.  Discussed with hospitalist, will admit.   Code sepsis initiated, though I suspect patients tachycardia, tachypnea are related to GI losses and fever.   The patients results and plan were reviewed and  discussed.   Any x-rays performed were independently reviewed by myself.   Differential diagnosis were considered with the presenting  HPI.  Medications  ciprofloxacin (CIPRO) IVPB 400 mg (400 mg Intravenous New Bag/Given 11/06/2015 1947)  metroNIDAZOLE (FLAGYL) IVPB 500 mg (500 mg Intravenous New Bag/Given 10/27/2015 1947)  sodium chloride 0.9 % bolus 1,000 mL (not administered)  ibuprofen (ADVIL,MOTRIN) tablet 800 mg (not administered)  morphine 4 MG/ML injection 4 mg (not administered)  ondansetron (ZOFRAN) injection 4 mg (not administered)  acetaminophen (TYLENOL) tablet 650 mg (650 mg Oral Given 11/06/2015 1439)  sodium chloride 0.9 % bolus 1,000 mL (0 mLs Intravenous Stopped 10/26/2015 1735)  morphine 4 MG/ML injection 4 mg (4 mg Intravenous Given 11/16/2015 1530)  ondansetron (ZOFRAN) injection 4 mg (4 mg Intravenous Given 11/06/2015 1530)  iopamidol (ISOVUE-300) 61 % injection 100 mL (100 mLs Intravenous Contrast Given 10/27/2015 1651)  sodium chloride 0.9 % bolus 1,000 mL (1,000 mLs Intravenous New Bag/Given 10/27/2015 1949)  methylPREDNISolone sodium succinate (SOLU-MEDROL) 40 mg/mL injection 40 mg (40 mg Intravenous Given 11/03/2015 1947)    Vitals:   11/01/2015 1815 10/23/2015 1830 10/18/2015 1900 11/15/2015 1930  BP: 146/88 147/90 147/89 156/90  Pulse: 119 110 110 112  Resp: (!) 29 (!) 30 22 (!) 27  Temp:      TempSrc:      SpO2: 93% 90% 95% 95%  Weight:      Height:        Final diagnoses:  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    Medications given during this visit Medications  ciprofloxacin (CIPRO) IVPB 400 mg (400 mg Intravenous New Bag/Given 11/01/2015 1947)  metroNIDAZOLE (FLAGYL) IVPB 500 mg (500 mg Intravenous New Bag/Given 11/02/2015 1947)  sodium chloride 0.9 % bolus 1,000 mL (not administered)  ibuprofen (ADVIL,MOTRIN) tablet 800 mg  (not administered)  morphine 4 MG/ML injection 4 mg (not administered)  ondansetron (ZOFRAN) injection 4 mg (not administered)  acetaminophen (TYLENOL) tablet 650 mg (650 mg Oral Given 10/27/2015 1439)  sodium chloride 0.9 % bolus 1,000 mL (0 mLs Intravenous Stopped 10/25/2015 1735)  morphine 4 MG/ML injection 4 mg (4 mg Intravenous Given 10/29/2015 1530)  ondansetron (ZOFRAN) injection 4 mg (4 mg Intravenous Given 10/31/2015 1530)  iopamidol (ISOVUE-300) 61 % injection 100 mL (100 mLs Intravenous Contrast Given 10/18/2015 1651)  sodium chloride 0.9 % bolus 1,000 mL (1,000 mLs Intravenous New Bag/Given 10/26/2015 1949)  methylPREDNISolone sodium succinate (SOLU-MEDROL) 40 mg/mL injection 40 mg (40 mg Intravenous Given 11/12/2015 1947)     The patient appears reasonably screen and/or stabilized for discharge and I doubt any other medical condition or other [facility] requiring further screening, evaluation, or treatment in the ED at this time prior to discharge.    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

## 2015-10-30 NOTE — ED Notes (Signed)
One set of blood cultures at bedside if needed.

## 2015-10-30 NOTE — Progress Notes (Signed)
Pharmacy Antibiotic Note  Nancy Acevedo is a 55 y.o. female admitted on 11/15/2015 with sepsis.  Pharmacy has been consulted for Cipro dosing. Pt having acute exacerbation of Crohn's disease. WBC elevated, mild bump in Scr, lactic acid elevated.   Plan: -Cipro 400 mg IV q12h -Flagyl per MD (per GI rec's) -Trend WBC, temp, renal function -F/U infectious work-up  Height: 5\' 4"  (162.6 cm) Weight: 191 lb (86.6 kg) IBW/kg (Calculated) : 54.7  Temp (24hrs), Avg:101.5 F (38.6 C), Min:99.3 F (37.4 C), Max:102.8 F (39.3 C)   Recent Labs Lab 10/22/2015 1510 11/05/2015 1600  WBC 14.7*  --   CREATININE 1.36*  --   LATICACIDVEN  --  2.66*    Estimated Creatinine Clearance: 51 mL/min (by C-G formula based on SCr of 1.36 mg/dL (H)).    Allergies  Allergen Reactions  . Lexapro [Escitalopram] Itching  . Losartan   . Spironolactone     rash   Nancy Acevedo 11/03/2015 11:07 PM

## 2015-10-30 NOTE — Progress Notes (Signed)
Patient accepted for admission to inpatient telemetry bed for diagnosis of Acute exacerbation of Crohn's disease causing sepsis, present on admission.  ED attending has been asked to activate sepsis protocol so that the patient will receive 30cc/kg of NS as initial volume resuscitation.  The patient will be on IV solumedrol plus cipro and flagyl per GI recs.  Dr. Carlean Purl to see in the AM.

## 2015-10-30 NOTE — ED Triage Notes (Signed)
Pt c/o chronic abd pain HX crohns  n/v/d x 2 days

## 2015-10-30 NOTE — ED Notes (Signed)
MD aware of Lactic Acid, no new orders, awaiting CT scan results for antibiotic therapy

## 2015-10-30 NOTE — ED Notes (Addendum)
Pt states she needs to use the restroom. Pt wheeled in wheelchair to restroom, hat placed in toilet to collect urine sample. Pt asked to pull herself up on bar by toilet, states she is too weak to do so. Pt asked if she would rather use a bedpan, she stated that she might have to. Pt taken to room and transferred to bed via 2 man max assist stand and pivot. Pt's daughter shows irritation that we are not "picking her up" and we are asking pt to pull herself up and stand with assistance. Pt placed on bed pan and asked to call when finished.

## 2015-10-31 ENCOUNTER — Encounter (HOSPITAL_COMMUNITY): Payer: Self-pay | Admitting: Internal Medicine

## 2015-10-31 DIAGNOSIS — K50813 Crohn's disease of both small and large intestine with fistula: Secondary | ICD-10-CM

## 2015-10-31 LAB — CBC WITH DIFFERENTIAL/PLATELET
BASOS PCT: 0 %
Basophils Absolute: 0 10*3/uL (ref 0.0–0.1)
EOS PCT: 0 %
Eosinophils Absolute: 0 10*3/uL (ref 0.0–0.7)
HEMATOCRIT: 25 % — AB (ref 36.0–46.0)
HEMOGLOBIN: 8 g/dL — AB (ref 12.0–15.0)
LYMPHS ABS: 0.6 10*3/uL — AB (ref 0.7–4.0)
Lymphocytes Relative: 5 %
MCH: 28.4 pg (ref 26.0–34.0)
MCHC: 32 g/dL (ref 30.0–36.0)
MCV: 88.7 fL (ref 78.0–100.0)
MONOS PCT: 2 %
Monocytes Absolute: 0.2 10*3/uL (ref 0.1–1.0)
Neutro Abs: 11.4 10*3/uL — ABNORMAL HIGH (ref 1.7–7.7)
Neutrophils Relative %: 93 %
Platelets: 88 10*3/uL — ABNORMAL LOW (ref 150–400)
RBC: 2.82 MIL/uL — AB (ref 3.87–5.11)
RDW: 19.8 % — ABNORMAL HIGH (ref 11.5–15.5)
WBC: 12.2 10*3/uL — AB (ref 4.0–10.5)

## 2015-10-31 LAB — COMPREHENSIVE METABOLIC PANEL
ALK PHOS: 53 U/L (ref 38–126)
ALT: 14 U/L (ref 14–54)
AST: 24 U/L (ref 15–41)
Albumin: 2.6 g/dL — ABNORMAL LOW (ref 3.5–5.0)
Anion gap: 9 (ref 5–15)
BILIRUBIN TOTAL: 1.2 mg/dL (ref 0.3–1.2)
BUN: 20 mg/dL (ref 6–20)
CHLORIDE: 108 mmol/L (ref 101–111)
CO2: 22 mmol/L (ref 22–32)
Calcium: 7.7 mg/dL — ABNORMAL LOW (ref 8.9–10.3)
Creatinine, Ser: 1.41 mg/dL — ABNORMAL HIGH (ref 0.44–1.00)
GFR, EST AFRICAN AMERICAN: 48 mL/min — AB (ref >60)
GFR, EST NON AFRICAN AMERICAN: 42 mL/min — AB (ref >60)
GLUCOSE: 200 mg/dL — AB (ref 65–99)
Potassium: 4.1 mmol/L (ref 3.5–5.1)
Sodium: 139 mmol/L (ref 135–145)
Total Protein: 5.2 g/dL — ABNORMAL LOW (ref 6.5–8.1)

## 2015-10-31 LAB — GLUCOSE, CAPILLARY
GLUCOSE-CAPILLARY: 152 mg/dL — AB (ref 65–99)
GLUCOSE-CAPILLARY: 155 mg/dL — AB (ref 65–99)
GLUCOSE-CAPILLARY: 146 mg/dL — AB (ref 65–99)
Glucose-Capillary: 153 mg/dL — ABNORMAL HIGH (ref 65–99)
Glucose-Capillary: 217 mg/dL — ABNORMAL HIGH (ref 65–99)

## 2015-10-31 LAB — C DIFFICILE QUICK SCREEN W PCR REFLEX
C DIFFICILE (CDIFF) INTERP: NOT DETECTED
C DIFFICLE (CDIFF) ANTIGEN: NEGATIVE
C Diff toxin: NEGATIVE

## 2015-10-31 LAB — BRAIN NATRIURETIC PEPTIDE: B Natriuretic Peptide: 299.7 pg/mL — ABNORMAL HIGH (ref 0.0–100.0)

## 2015-10-31 LAB — TROPONIN I
Troponin I: 0.09 ng/mL (ref <0.03)
Troponin I: 0.11 ng/mL (ref <0.03)

## 2015-10-31 LAB — LACTIC ACID, PLASMA: LACTIC ACID, VENOUS: 1.3 mmol/L (ref 0.5–1.9)

## 2015-10-31 MED ORDER — SODIUM CHLORIDE 0.9 % IV SOLN
INTRAVENOUS | Status: AC
  Administered 2015-10-31: 15:00:00 via INTRAVENOUS
  Filled 2015-10-31 (×2): qty 1000

## 2015-10-31 NOTE — Discharge Instructions (Signed)
Crohn Disease Crohn disease is a long-lasting (chronic) disease that affects your gastrointestinal (GI) tract. It often causes irritation and swelling (inflammation) in your small intestine and the beginning of your large intestine. However, it can affect any part of your GI tract. Crohn disease is part of a group of illnesses that are known as inflammatory bowel disease (IBD). Crohn disease may start slowly and get worse over time. Symptoms may come and go. They may also disappear for months or even years at a time (remission). CAUSES The exact cause of Crohn disease is not known. It may be a response that causes your body's defense system (immune system) to mistakenly attack healthy cells and tissues (autoimmune response). Your genes and your environment may also play a role. RISK FACTORS You may be at greater risk for Crohn disease if you:  Have other family members with Crohn disease or another IBD.  Use any tobacco products, including cigarettes, chewing tobacco, or electronic cigarettes.  Are in your 20s.  Have Eastern European ancestry. SIGNS AND SYMPTOMS The main signs and symptoms of Crohn disease involve your GI tract. These include:  Diarrhea.  Rectal bleeding.  An urgent need to move your bowels.  The feeling that you are not finished having a bowel movement.  Abdominal pain or cramping.  Constipation. General signs and symptoms of Crohn disease may also include:  Unexplained weight loss.  Fatigue.  Fever.  Nausea.  Loss of appetite.  Joint pain  Changes in vision.  Red bumps on your skin. DIAGNOSIS Your health care provider may suspect Crohn disease based on your symptoms and your medical history. Your health care provider will do a physical exam. You may need to see a health care provider who specializes in diseases of the digestive tract (gastroenterologist). You may also have tests to help your health care providers make a diagnosis. These may  include:  Blood tests.  Stool sample tests.  Imaging tests, such as X-rays and CT scans.  Tests to examine the inside of your intestines using a long, flexible tube that has a light and a camera on the end (endoscopy or colonoscopy).  A procedure to take tissue samples from inside your bowel (biopsy) to be examined under a microscope. TREATMENT  There is no cure for Crohn disease. Treatment will focus on managing your symptoms. Crohn disease affects each person differently. Your treatment may include:  Resting your bowels. Drinking only clear liquids or getting nutrition through an IV for a period of time gives your bowels a chance to heal because they are not passing stools.  Medicines. These may be used alone or in combination (combination therapy). These may include antibiotic medicines. You may be given medicines that help to:  Reduce inflammation.  Control your immune system activity.  Fight infections.  Relieve cramps and prevent diarrhea.  Control your pain.  Surgery. You may need surgery if:  Medicines and other treatments are no longer working.  You develop complications from severe Crohn disease.  A section of your intestine becomes so damaged that it needs to be removed. HOME CARE INSTRUCTIONS  Take medicines only as directed by your health care provider.  If you were prescribed an antibiotic medicine, finish it all even if you start to feel better.  Keep all follow-up visits as directed by your health care provider. This is important.  Talk with your health care provider about changing your diet. This may help your symptoms. Your health care provide may recommend changes, such   as:  Drinking more fluids.  Avoiding milk and other foods that contain lactose.  Eating a low-fat diet.  Avoiding high-fiber foods, such as popcorn and nuts.  Avoiding carbonated beverages, such as soda.  Eating smaller meals more often rather than eating large  meals.  Keeping a food diary to identify foods that make your symptoms better or worse.  Do not use any tobacco products, including cigarettes, chewing tobacco, or electronic cigarettes. If you need help quitting, ask your health care provider.  Limit alcohol intake to no more than 1 drink per day for nonpregnant women and 2 drinks per day for men. One drink equals 12 ounces of beer, 5 ounces of wine, or 1 ounces of hard liquor.  Exercise daily or as directed by your health care provider. SEEK MEDICAL CARE IF:  You have diarrhea, abdominal cramps, and other gastrointestinal problems that are present almost all of the time.  Your symptoms do not improve with treatment.  You continue to lose weight.  You develop a rash or sores on your skin.  You develop eye problems.  You have a fever.   Your symptoms get worse.  You develop new symptoms. SEEK IMMEDIATE MEDICAL CARE IF:  You have bloody diarrhea.  You develop severe abdominal pain.  You cannot pass stools.   This information is not intended to replace advice given to you by your health care provider. Make sure you discuss any questions you have with your health care provider.   Document Released: 10/13/2004 Document Revised: 01/24/2014 Document Reviewed: 08/21/2013 Elsevier Interactive Patient Education 2016 [employer]  

## 2015-10-31 NOTE — Progress Notes (Signed)
CRITICAL VALUE ALERT  Critical value received:  Troponin 0.09  Date of notification:  10/31/2015  Time of notification: 1629    Critical value read back: yes  Nurse who received alert:  Grant Fontana BSN, RN  MD notified (1st page):  Dr. Carles Collet  Time of first page:  1634  MD notified (2nd page):  Time of second page:  Responding MD:  Dr. Carles Collet  Time MD responded:  [phone]

## 2015-10-31 NOTE — Progress Notes (Signed)
PROGRESS NOTE  Nancy Acevedo [id number] DOB: 1960-07-07 DOA: 10/26/2015 PCP: Binnie Rail, MD  Brief History:  55 y.o. female with medical history significant for hypertension, chronic kidney disease stage II, OSA, GERD, and recent diagnosis of Crohn's ileocolitis managed with prednisone who presented to the [facility] emergency department with 2-3 days of fevers, nausea, vomiting, and nonbloody diarrhea. 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   Pt has had abd pain x 6 months resulting in colonoscopy on 07/31/15 (Dr. Ardis Hughs). This showed a large ulcer ileocecal valve, abnormal mucosa of the TI region, seven punctate, deep ulcers in the rt colon all about 6 mm across; Path-active inflammation with ulceration, no dysplasia.  Abdominal pain worsening in past 2 weeks to point patient "could hardly walk". Along with this the patient has been having at least 10 loose stools per day, but denies seeing any bright red blood or melena. The patient has had some fevers and nausea as well as vomiting within the past few days.   Patient has also had sharp substernal CP for past several months.  She claims increase dyspnea on exertion walking up her stairs at home along with exertional chest pain for 1-2 months.  She has had an extensive cardiac work up in January 2017 when she had exertional chest pain associated with syncope.  She underwent heart catheterization on 02/11/2015 which revealed normal coronaries with EF 55-60%. Typical cardiac rhythm at the time of discharge EF 60-65% with no wall motion and mellitus.  Assessment/Plan: Crohn's ileocolitis with entero-enterofistula and  flare -appreciate GI consult -continue IV solumedrol -continue IVF -diet per GI service -11/17/2015 CT  abdomen and pelvis--long segment of bowel wall thickening with surrounding inflammation from terminal ileum to the sigmoid with suspected RLQ entero-antral fistula connecting terminal ileum to the small bowel -Continue IV Cipro and Flagyl -C diff negative  Sepsis -present at the time of admission -secondary to intraabdominal process -continue IVF -continue IV cipro/flagyl  Chest pain -some typical and atypical components -Completely reproducible on palpation of sternum -02/11/15 cardiac cath--normal coronaries -EKG -CXR-increase interstitial markings -Echocardiogram -02/11/2015 echo EF 60-65%, no WMA  Elevated troponin -demand ischemia in setting of sepsis  CKD 2 -Baseline creatinines are 1-1.2  Hypertension -Continue amlodipine and metoprolol tartrate -pt endorsed poor compliance with meds at home missing 2-3 days per week  Diabetes mellitus type 2 -10/02/2015 hemoglobin A1c 6.6 -Continue NovoLog sliding scale -pt not on any agents at home  GERD -10/22/14--EGD--The esophageal mucosa was abnormal appearing throughout but mainly in the proximal 1/2 of the esophagus. There were numerous linear and irregularly patterned furrows throughout.  Thrombocytopenia -likely due to sepsis, but pt with hx of B12 deficiency -not completely compliant with B12 supplements at home -check B12 -am CBC  Hypokalemia  -replete -Check magnesium   Disposition Plan:   Not stable for d/c Family Communication:   Family at bedside updated 10/31/15--Total time spent 40 minutes.  Greater than 50% spent face to face counseling and coordinating care.   Consultants: Gi--[facility]   Code Status:  FULL  DVT Prophylaxis:  SCDs   Procedures: As Listed in Progress Note Above  Antibiotics: None    Subjective:  patient complains of abdominal pain but certainly better than yesterday. Denies any nausea  or vomiting since admission. Complains of chest pain or shortness of breath. Denies any  fevers, chills, headache, neck pain. Denies hemoptysis, hematochezia, melena. No dysuria or hematuria. No rashes.  Objective: Vitals:   10/31/15 0500 10/31/15 0815 10/31/15 0817 10/31/15 1354  BP:   (!) 142/85 115/62  Pulse:   75 71  Resp:    18  Temp:    98.2 F (36.8 C)  TempSrc:    Oral  SpO2:  (!) 80% 97% 95%  Weight: 88.7 kg (195 lb 8 oz)     Height:        Intake/Output Summary (Last 24 hours) at 10/31/15 1640 Last data filed at 10/31/15 1300  Gross per 24 hour  Intake             5213 ml  Output                1 ml  Net             5212 ml   Weight change:  Exam:   General:  Pt is alert, follows commands appropriately, not in acute distress  HEENT: No icterus, No thrush, No neck mass, [place]/AT  Cardiovascular: RRR, S1/S2, no rubs, no gallops; sternum tender to palpation   Respiratory: CTA bilaterally, no wheezing, no crackles, no rhonchi  Abdomen: Soft/+BS, non tender, non distended, no guarding  Extremities: No edema, No lymphangitis, No petechiae, No rashes, no synovitis   Data Reviewed: I have personally reviewed following labs and imaging studies Basic Metabolic Panel:  Recent Labs Lab 11/06/2015 1510 11/13/2015 2245 10/31/15 0219  NA 137  --  139  K 3.1*  --  4.1  CL 102  --  108  CO2 23  --  22  GLUCOSE 124*  --  200*  BUN 27*  --  20  CREATININE 1.36*  --  1.41*  CALCIUM 8.4*  --  7.7*  MG  --  1.4*  --    Liver Function Tests:  Recent Labs Lab 10/19/2015 1510 10/31/15 0219  AST 27 24  ALT 17 14  ALKPHOS 62 53  BILITOT 1.6* 1.2  PROT 6.5 5.2*  ALBUMIN 3.3* 2.6*    Recent Labs Lab 10/29/2015 1510  LIPASE 17   No results for input(s): AMMONIA in the last 168 hours. Coagulation Profile:  Recent Labs Lab 10/18/2015 2245  INR 0.98   CBC:  Recent Labs Lab 10/26/2015 1510 10/31/15 0219  WBC 14.7* 12.2*  NEUTROABS 12.8* 11.4*  HGB 9.7* 8.0*  HCT 29.8* 25.0*  MCV 91.4 88.7  PLT 111* 88*   Cardiac Enzymes:  Recent Labs Lab  10/31/15 1516  TROPONINI 0.09*   BNP: Invalid input(s): POCBNP CBG:  Recent Labs Lab 11/02/2015 2324 10/31/15 0633 10/31/15 0658 10/31/15 1148 10/31/15 1635  GLUCAP 173* 153* 152* 155* 217*   HbA1C: No results for input(s): HGBA1C in the last 72 hours. Urine analysis:    Component Value Date/Time   COLORURINE YELLOW 09/29/2015 1140   APPEARANCEUR CLEAR 09/29/2015 1140   LABSPEC 1.009 09/29/2015 1140   PHURINE 6.5 09/29/2015 1140   GLUCOSEU NEGATIVE 09/29/2015 1140   GLUCOSEU NEGATIVE 04/15/2015 1209   HGBUR NEGATIVE 09/29/2015 1140   BILIRUBINUR NEGATIVE 09/29/2015 1140   KETONESUR NEGATIVE 09/29/2015 1140   PROTEINUR NEGATIVE 09/29/2015 1140   UROBILINOGEN 0.2 04/15/2015 1209   NITRITE NEGATIVE 09/29/2015 1140   LEUKOCYTESUR NEGATIVE 09/29/2015 1140   Sepsis Labs: @LABRCNTIP (procalcitonin:4,lacticidven:4) ) Recent Results (from the past 240 hour(s))  C difficile quick screen w PCR reflex     Status: None   Collection Time: 10/31/15 10:00 AM  Result Value Ref Range Status   C Diff antigen NEGATIVE NEGATIVE Final   C Diff toxin NEGATIVE NEGATIVE Final   C Diff interpretation No C. difficile detected.  Final     Scheduled Meds: . amLODipine  10 mg Oral Daily  . ciprofloxacin  400 mg Intravenous Q12H  . famotidine (PEPCID) IV  20 mg Intravenous Q12H  . insulin aspart  0-5 Units Subcutaneous QHS  . insulin aspart  0-9 Units Subcutaneous TID WC  . methylPREDNISolone (SOLU-MEDROL) injection  40 mg Intravenous Q12H  . metoprolol tartrate  50 mg Oral BID  . metronidazole  500 mg Intravenous Q8H  . sodium chloride flush  3 mL Intravenous Q12H   Continuous Infusions: . sodium chloride 0.9 % 1,000 mL infusion 75 mL/hr at 10/31/15 1500    Procedures/Studies: Dg Chest 2 View  Result Date: 11/10/2015 CLINICAL DATA:  Chest pain and coughing for 2 weeks. EXAM: CHEST  2 VIEW COMPARISON:  02/09/2015 FINDINGS: AP and lateral views of the chest show low volumes. The  cardio pericardial silhouette is enlarged. There is pulmonary vascular congestion without overt pulmonary edema. The visualized bony structures of the thorax are intact. Telemetry leads overlie the chest. IMPRESSION: Cardiomegaly with pulmonary vascular congestion. Electronically Signed   By: Misty Stanley M.D.   On: 10/26/2015 17:37   Ct Abdomen Pelvis W Contrast  Result Date: 11/06/2015 CLINICAL DATA:  Crohn's disease. Diffuse abdominal pain. Nausea, vomiting and diarrhea. EXAM: CT ABDOMEN AND PELVIS WITH CONTRAST TECHNIQUE: Multidetector CT imaging of the abdomen and pelvis was performed using the standard protocol following bolus administration of intravenous contrast. CONTRAST:  132mL ISOVUE-300 IOPAMIDOL (ISOVUE-300) INJECTION 61% COMPARISON:  MR enterography 10/27/2015 FINDINGS: Lower chest: No pulmonary nodules. No visible pleural or pericardial effusion. Hepatobiliary: Normal hepatic size and contours without focal liver lesion. No perihepatic ascites. No intra- or extrahepatic biliary dilatation. Normal gallbladder. Pancreas: Normal pancreatic contours and enhancement. No peripancreatic fluid collection or pancreatic ductal dilatation. Spleen: Normal. Adrenals/Urinary Tract: Normal adrenal glands. No hydronephrosis or solid renal mass. Stomach/Bowel: There is long segment bowel wall thickening and surrounding inflammatory stranding extending from the terminal ileum to the junction of the descending and sigmoid colon. There is again seen a suspected fistula between the terminal ileum and an adjacent loop of right lower quadrant bowel (sagittal image 43, coronal image 51). The appendix is not visualized. No abscess or focal fluid collection. No dilated small bowel. No evidence of focal small bowel inflammation. Vascular/Lymphatic: Normal course and caliber of the major abdominal vessels. No abdominal or pelvic adenopathy. Reproductive: Hyperenhancing focus at the uterine fundus is likely a small  fibroid. Ovaries are normal. Musculoskeletal: Multilevel osteophytosis and facet arthrosis. No bony spinal canal stenosis. No lytic or blastic lesions. Normal visualized extrathoracic and extraperitoneal soft tissues. Other: No contributory non-categorized findings. IMPRESSION: 1. Long segment bowel inflammation extending from the terminal ileum to the junction of the descending and sigmoid colon, consistent with active Crohn's disease. No abscess or fluid collection. 2. 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 Electronically Signed   By: Ulyses Jarred M.D.   On: 10/26/2015 17:49   Mr Consuela Mimes W/o W/cm  Result Date: 10/27/2015 CLINICAL DATA:  55 year old female with Crohn disease and abdominal pain. History of appendectomy. EXAM: MR ABDOMEN AND PELVIS WITHOUT AND WITH  CONTRAST (MR ENTEROGRAPHY) TECHNIQUE: Multiplanar, multisequence MRI of the abdomen and pelvis was performed both before and during bolus administration of intravenous contrast. Negative oral contrast VoLumen was given. CONTRAST:  92mL MULTIHANCE GADOBENATE DIMEGLUMINE 529 MG/ML IV SOLN COMPARISON:  09/29/2015 CT abdomen/pelvis. FINDINGS: COMBINED FINDINGS FOR BOTH MR ABDOMEN AND PELVIS Many of the sequences are motion degraded. Lower chest: Clear lung bases. Stable mild elevation of the left hemidiaphragm. Hepatobiliary: Normal liver size and configuration. No liver mass. Normal gallbladder with no cholelithiasis. No biliary ductal dilatation. Common bile duct diameter 2 mm. No choledocholithiasis. Pancreas: No pancreatic mass or duct dilation.  No pancreas divisum. Spleen: Normal size. No mass. Adrenals/Urinary Tract: Normal adrenals. No hydronephrosis. Simple subcentimeter exophytic renal cyst in the far upper right kidney. Otherwise normal kidneys, with no suspicious renal mass. Stomach/Bowel: Normal stomach. Normal caliber small bowel. There is mild wall thickening  and mucosal hyperenhancement in the terminal ileum (series 1201/ image 96). There is a closely approximated distal small bowel loop in the right abdomen anterior to the terminal ileum, with a suspected short 1 cm in length fistula between the terminal ileum and this adjacent right abdominal small bowel loop (series 14/ image 85). There is a separate approximately 5 cm in length loop of small bowel in the left abdomen demonstrating mild mucosal hyperenhancement (series 1201/ image 89). Otherwise no small bowel wall thickening or wall hyperenhancement. No focal small bowel caliber transition. Status post appendectomy. Normal caliber large bowel. No large bowel wall thickening or wall hyperenhancement. Mild descending and proximal sigmoid colonic diverticulosis. No pericolonic or mesenteric edema. Vascular/Lymphatic: Normal caliber abdominal aorta. Patent portal, splenic, hepatic and renal veins. No pathologically enlarged lymph nodes in the abdomen. Reproductive: Anterior upper uterine body subserosal enhancing 1.8 x 1.6 x 1.9 cm fibroid. Unremarkable ovaries. No adnexal masses. Other: No abdominal ascites or focal fluid collection. Musculoskeletal: No aggressive appearing focal osseous lesions. IMPRESSION: 1. Mild active Crohn terminal ileitis. Suspected short 1 cm in length fistula between the terminal ileum and an adjacent right abdominal small bowel loop anterior to the terminal ileum. 2. Possible mild active Crohn enteritis within a separate short proximal left abdominal small bowel segment. 3. No abscess.  No evidence of bowel obstruction. 4. Solitary subserosal uterine fibroid. Electronically Signed   By: Ilona Sorrel M.D.   On: 10/27/2015 18:33   Mr [place] Pelvis W/o W/cm  Result Date: 10/27/2015 CLINICAL DATA:  55 year old female with Crohn disease and abdominal pain. History of appendectomy. EXAM: MR ABDOMEN AND PELVIS WITHOUT AND WITH CONTRAST (MR ENTEROGRAPHY) TECHNIQUE: Multiplanar, multisequence MRI  of the abdomen and pelvis was performed both before and during bolus administration of intravenous contrast. Negative oral contrast VoLumen was given. CONTRAST:  72mL MULTIHANCE GADOBENATE DIMEGLUMINE 529 MG/ML IV SOLN COMPARISON:  09/29/2015 CT abdomen/pelvis. FINDINGS: COMBINED FINDINGS FOR BOTH MR ABDOMEN AND PELVIS Many of the sequences are motion degraded. Lower chest: Clear lung bases. Stable mild elevation of the left hemidiaphragm. Hepatobiliary: Normal liver size and configuration. No liver mass. Normal gallbladder with no cholelithiasis. No biliary ductal dilatation. Common bile duct diameter 2 mm. No choledocholithiasis. Pancreas: No pancreatic mass or duct dilation.  No pancreas divisum. Spleen: Normal size. No mass. Adrenals/Urinary Tract: Normal adrenals. No hydronephrosis. Simple subcentimeter exophytic renal cyst in the far upper right kidney. Otherwise normal kidneys, with no suspicious renal mass. Stomach/Bowel: Normal stomach. Normal caliber small bowel. There is mild wall thickening and mucosal hyperenhancement in the terminal ileum (series 1201/ image 96). There is  a closely approximated distal small bowel loop in the right abdomen anterior to the terminal ileum, with a suspected short 1 cm in length fistula between the terminal ileum and this adjacent right abdominal small bowel loop (series 14/ image 85). There is a separate approximately 5 cm in length loop of small bowel in the left abdomen demonstrating mild mucosal hyperenhancement (series 1201/ image 89). Otherwise no small bowel wall thickening or wall hyperenhancement. No focal small bowel caliber transition. Status post appendectomy. Normal caliber large bowel. No large bowel wall thickening or wall hyperenhancement. Mild descending and proximal sigmoid colonic diverticulosis. No pericolonic or mesenteric edema. Vascular/Lymphatic: Normal caliber abdominal aorta. Patent portal, splenic, hepatic and renal veins. No pathologically  enlarged lymph nodes in the abdomen. Reproductive: Anterior upper uterine body subserosal enhancing 1.8 x 1.6 x 1.9 cm fibroid. Unremarkable ovaries. No adnexal masses. Other: No abdominal ascites or focal fluid collection. Musculoskeletal: No aggressive appearing focal osseous lesions. IMPRESSION: 1. Mild active Crohn terminal ileitis. Suspected short 1 cm in length fistula between the terminal ileum and an adjacent right abdominal small bowel loop anterior to the terminal ileum. 2. Possible mild active Crohn enteritis within a separate short proximal left abdominal small bowel segment. 3. No abscess.  No evidence of bowel obstruction. 4. Solitary subserosal uterine fibroid. Electronically Signed   By: Ilona Sorrel M.D.   On: 10/27/2015 18:33    Raesean Bartoletti, DO  [facility] Pager [phone]  If 7PM-7AM, please contact night-coverage [email or web address] Password [email or web address] 10/31/2015, 4:40 PM   LOS: 1 day

## 2015-10-31 NOTE — Progress Notes (Signed)
Pt arrived to [facility] placed on tele. Vss. Pt oriented to room. Plan of care discussed with pt. Md made aware of arrival. Will continue to follow.

## 2015-10-31 NOTE — Progress Notes (Signed)
RT NOTE:  Pt refused to wear CPAP tonight. Does not wear @ home. RN @ bedside. Will call if patient changes her mind.

## 2015-10-31 NOTE — Consult Note (Addendum)
Consultation  Referring Provider: Dr. Carles Collet     Primary Care Physician:  Binnie Rail, MD Primary Gastroenterologist:Dr. Ardis Hughs     Reason for Consultation:   Crohn's colitis with flare, sepsis, entero-entero fistula           Impression / Plan:   Impression: 1. 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 GERD: Last EGD shows esophagitis, patient on twice a day PPI 3. Normocytic anemia, thrombocytopenia: Hemoglobin 9.7 on admission, new thrombocytopenia with platelets 111,000 on admission, no active bleeding 4. CK D stage II 5. Hypokalemia  Plan: 1. Continue Solu-Medrol 40 mg IV twice a day for now, this will be titrated down in the future 2. Continue IV Cipro and Flagyl 3. Continue IV fluids, when necessary pain meds 4. Patient currently on enteric precautions as C. difficile is pending 5. We will likely initiate Remicade during patient's stay - waiting for quantiferon results which need to be negative to start 6. Will discuss with Dr. Carlean Purl, please await any further recommendations  Thank you for your kind consultation, we will continue to follow.  Lavone Nian Lemmon  10/31/2015, 8:02 AM Pager #: [phone]   [facility] Attending   I have taken an independent history, reviewed the chart and examined the patient. I agree with the Advanced Practitioner's note, impression and recommendations.   C diff was negative - I dced enteric precautions. Waiting for quantiferon results before  starting remicade I have looked at endoscopic and radiologic images related to her care.  Gatha Mayer, MD, [facility] [phone] (pager) [phone] after 5 PM, weekends and holidays  10/31/2015 11:53 AM     HPI:   Nancy Acevedo is a 55 y.o. female with a past medical history significant for hypertension, chronic kidney disease stage II, OSA, GERD and a recent diagnosis of Crohn's colitis which has been managed with prednisone, who presented to the ED on 11/15/2015 with a complaint of 2-3 days of fevers, nausea, vomiting and nonbloody diarrhea.  Today, the patient tells me that she developed abdominal pain about 6 months ago and had a colonoscopy performed by Dr. Ardis Hughs in July. This found a large ulcer and some "small ulcers in her right colon" (see previous Gi history below). There was an unclear diagnosis at that time, though the patient was placed on Prednisone 20 mg. The patient tells me that since that time she has had constant abdominal pain which has been unrelieved. Most recently over the past 2 weeks this has gotten worse until yesterday when she was at her father's funeral she could "hardly walk" due to this pain. Along with this the patient has been having at least 10 loose stools per day, but denies seeing any bright red blood or melena. The patient has had some fevers and nausea as well as vomiting within the past few days. Yesterday she tells me her abdominal pain was a 10 out of 10 and mostly in her mid abdomen. Currently this pain is down to a 7 out of 10. She describes this  as cramping. Patient also tells me she has been unable to eat for the past 2-3 days as she is scared that this will "make me feel worse". She does give me a list of foods which she has found that she just can't eat which included many fatty/fried foods.  Patient's social history is pertinent for the loss of her father recently. She was at his wake yesterday when she had an exacerbation  of symptoms. She is tearful at the time of my interview.  Patient denies use of NSAIDs over the past 2 weeks since being told to stop them by Dr. Ardis Hughs, heartburn, reflux, documented weight loss, dizziness, headache or syncope.  ED course: CT abdomen and pelvis with contrast: Impression: Long segment bowel inflammation extending from the terminal ileum to the junction of the descending and sigmoid colon, consistent with active Crohn's disease. No abscess or fluid collection. 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 Solitary subserosal uterine fibroid. Plan: Dr. Ardis Hughs spoke with the patient about results of entero-entero fistula and starting of Remicade was discussed due to prednisone not working. QuantiFERON Gold TB testing and hepatitis B surface antigen as well as hepatitis B surface antibody and hepatitis C antibody were ordered and GI pathogen panel was requested. Labs and stool studies were negative. Vicodin was filled for the patient's pain. 10/19/15-Ov, Dr Ardis Hughs: Chief complaint was abdominal pain and diarrhea, she had lost 10 pounds in 6 weeks, CT scan within the past month with essentially normal she was currently on 20 mg prednisone twice daily and had been for 2 weeks, noted 10 loose stools per day; plan: Per Dr. Ardis Hughs unclear picture of possible inflammatory bowel disease, endoscopic appearance was a bit unusual for Crohn's, pathology showed only acute inflammation patient had also been taking a lot of NSAIDs around that time.  There is no bowel thickening or obvious bowel inflammation on CT scan 3 weeks ago which was confirmed with the radiologist, repeat imaging with MR enterography as well as a basic metabolic panel and inflammatory markers as ordered it was suggested that if this is not clear things up she may need repeat endoscopy testing colonoscopy and/or upper endoscopy she was to stand prednisone 20 mg twice daily  without a taper 07/31/15-Colonoscopy, Dr. Ardis Hughs:Impression: lg ulcer ileocecal valve, abnormal mucosa of the TI region, seven punctate, deep ulcers in the rt colon all about 6 mm across; Path-active inflammation with ulceration, no dysplasia 10/22/14-EGD, Dr. Ardis Hughs: Impression: Esophageal mucosa was of normal appearing throughout but mainly in the proximal half of the esophagus, there were numerous Linner and irregularly pattern for [place] throughout, no obvious candida infection or ulcers, esophagus is biopsied and mucosa seemed to be thickened, firm; plan: Patient was started on daily Diflucan and continued on twice daily Protonix path: Mucosa showed changes consistent with reflux  Past Medical History:  Diagnosis Date  . Candidiasis of mouth    History   . Complication of anesthesia    " SLOW WAKING UP "  . Diabetes mellitus, type 2 ([place]) dx 01/2011   borderline - no current medications, diet controlled  .  EP (ectopic pregnancy)   . GERD (gastroesophageal reflux disease)    no meds, diet controlled  . Gout    hx - no current problem  . Headaches, cluster    otc med prn  . History of hyperglycemia 1993  . Hypertension   . OSA (obstructive sleep apnea)    uses CPAP nightly  . Sleep apnea    Uses CPAP every night  . Syncope 02/09/15  . Syncope 02/10/2015    Past Surgical History:  Procedure Laterality Date  . APPENDECTOMY    . CARDIAC CATHETERIZATION N/A 02/11/2015   Procedure: Left Heart Cath and Coronary Angiography;  Surgeon: Troy Sine, MD;  Location: [facility];  Service:  Cardiovascular;  Laterality: N/A;- Normal results  . COLONOSCOPY  2016   Ardis Hughs - Normal  . ECTOPIC PREGNANCY SURGERY     left fallopian tube  . MYRINGOTOMY WITH TUBE PLACEMENT    . TONSILLECTOMY    . TONSILLECTOMY AND ADENOIDECTOMY  2012  . TUBAL LIGATION      Family History  Problem Relation Age of Onset  . Arthritis Sister   . Diabetes Mother   . Hypertension Mother   . Colon cancer Neg Hx   . Rectal cancer Neg Hx   . Stomach cancer Neg Hx   . Esophageal cancer Neg Hx     Social History  Substance Use Topics  . Smoking status: Never Smoker  . Smokeless tobacco: Never Used  . Alcohol use No    Prior to Admission medications   Medication Sig Start Date End Date Taking? Authorizing Provider  glucose blood test strip 1 each by Other route as needed for other. Use as instructed   Yes Historical Provider, MD  amLODipine (NORVASC) 10 MG tablet Take 1 tablet (10 mg total) by mouth daily. 02/10/15   Nita Sells, MD  aspirin (GOODSENSE ASPIRIN) 81 MG chewable tablet Chew 81 mg by mouth. 10/18/14   Historical Provider, MD  Hydrocodone-Acetaminophen 7.5-300 MG TABS Take 1-2 tabs by mouth every 4-6 hours as needed for pain 10/28/15   Milus Banister, MD  meclizine (ANTIVERT) 25 MG tablet Take 0.5-1 tablets (12.5-25 mg total) by mouth 3 (three) times daily as needed for dizziness. 10/26/15   Binnie Rail, MD  metoprolol succinate (TOPROL-XL) 100 MG 24 hr tablet Take 1 tablet (100 mg total) by mouth daily. Take with or immediately following a meal. 12/01/14   Binnie Rail, MD  ondansetron (ZOFRAN ODT) 4 MG disintegrating tablet Take 1 tablet (4 mg total) by mouth every 8 (eight) hours as needed for nausea or vomiting. 10/02/15   Binnie Rail, MD  oxyCODONE-acetaminophen (PERCOCET) 5-325 MG tablet Take 1-2 tablets by mouth every 6 (six) hours as needed. 10/16/15   Veryl Speak, MD  potassium chloride SA (K-DUR,KLOR-CON) 20 MEQ tablet Take 2 tablets (40 mEq total) by mouth daily.  10/04/15   Binnie Rail, MD  predniSONE (DELTASONE) 20 MG tablet Take 3 tablets daily for 3 days, then 2 tablets daily for 3 days, then 1 tablet daily for 3 days, then resume your previous dose. 10/16/15   Veryl Speak, MD  promethazine (PHENERGAN) 25 MG tablet Take 1 tablet (25 mg total) by mouth every 6 (six) hours as needed for nausea or vomiting. 09/29/15   Dalia Heading, PA-C    Current Facility-Administered Medications  Medication Dose Route Frequency Provider Last Rate Last Dose  . 0.9 % NaCl with KCl 40 mEq / L  infusion   Intravenous Continuous Vianne Bulls, MD 100 mL/hr at 10/29/2015 2348 100 mL/hr at 10/22/2015 2348  . acetaminophen (TYLENOL) tablet 650 mg  650 mg Oral Q6H PRN Vianne Bulls, MD       Or  . acetaminophen (TYLENOL) suppository 650 mg  650 mg Rectal Q6H PRN Ilene Qua Opyd, MD      . amLODipine (NORVASC) tablet 10 mg  10 mg Oral Daily Ilene Qua Opyd, MD      . ciprofloxacin (CIPRO) IVPB 400 mg  400 mg Intravenous Q12H Erenest Blank, RPH      . famotidine (PEPCID) IVPB 20 mg premix  20 mg Intravenous Q12H Vianne Bulls, MD   20 mg at 11/15/2015 2349  . hydrALAZINE (APRESOLINE) injection 10 mg  10 mg Intravenous Q4H PRN Vianne Bulls, MD   10 mg at 10/31/15 0455  . HYDROmorphone (DILAUDID) injection 0.5-1 mg  0.5-1 mg Intravenous Q3H PRN Vianne Bulls, MD   1 mg at 10/31/15 0455  . insulin aspart (novoLOG) injection 0-5 Units  0-5 Units Subcutaneous QHS Timothy S Opyd, MD      . insulin aspart (novoLOG) injection 0-9 Units  0-9 Units Subcutaneous TID WC Vianne Bulls, MD   2 Units at 10/31/15 [phone]  . meclizine (ANTIVERT) tablet 12.5-25 mg  12.5-25 mg Oral TID PRN Vianne Bulls, MD      . methylPREDNISolone sodium succinate (SOLU-MEDROL) 40 mg/mL injection 40 mg  40 mg Intravenous Q12H Ilene Qua Opyd, MD      . metoprolol (LOPRESSOR) tablet 50 mg  50 mg Oral BID Vianne Bulls, MD   50 mg at 10/21/2015 2349  . metroNIDAZOLE (FLAGYL) IVPB 500 mg  500 mg Intravenous Q8H  Vianne Bulls, MD   500 mg at 10/31/15 0359  . ondansetron (ZOFRAN) tablet 4 mg  4 mg Oral Q6H PRN Vianne Bulls, MD       Or  . ondansetron (ZOFRAN) injection 4 mg  4 mg Intravenous Q6H PRN Vianne Bulls, MD      . oxyCODONE-acetaminophen (PERCOCET/ROXICET) 5-325 MG per tablet 1-2 tablet  1-2 tablet Oral Q6H PRN Vianne Bulls, MD      . promethazine (PHENERGAN) tablet 25 mg  25 mg Oral Q6H PRN Ilene Qua Opyd, MD      . sodium chloride flush (NS) 0.9 % injection 3 mL  3 mL Intravenous Q12H Vianne Bulls, MD   3 mL at 11/11/2015 2348    Allergies as of 10/29/2015 - Review Complete 10/26/2015  Allergen Reaction Noted  . Lexapro [escitalopram] Itching 05/06/2013  . Losartan  08/29/2015  . Spironolactone  11/20/2012     Review of Systems:     Constitutional: Positive for some fatigue and minimal weight loss as well as fever No chills or weakness HEENT: Eyes: No change in vision               Ears, Nose, Throat:  No change in hearing or congestion Skin: No rash or itching Cardiovascular: No chest pain, chest pressure or palpitations   Respiratory: No SOB or cough Gastrointestinal: See HPI and otherwise negative Genitourinary: No dysuria or change in urinary frequency Neurological: No headache, dizziness or syncope Musculoskeletal: No new muscle or joint pain Hematologic: No bleeding or bruising Psychiatric: Patient sad due to loss of her father recently No history of anxiety   Physical Exam:  Vital signs in last 24 hours: Temp:  [97.6  F (36.4 C)-102.8 F (39.3 C)] 97.6 F (36.4 C) (10/14 0437) Pulse Rate:  [65-128] 65 (10/14 0437) Resp:  [0-39] 19 (10/14 0437) BP: (146-194)/(85-110) 175/85 (10/14 0437) SpO2:  [89 %-100 %] 96 % (10/14 0437) Weight:  [191 lb (86.6 kg)-195 lb 8 oz (88.7 kg)] 195 lb 8 oz (88.7 kg) (10/14 0500) Last BM Date: 11/02/2015 General:   Pleasant African-American female appears to be in mild discomfort, Well developed, Well nourished, alert and  cooperative Head:  Normocephalic and atraumatic. Eyes:   PEERL, EOMI. No icterus. Conjunctiva pink. Ears:  Normal auditory acuity. Neck:  Supple Throat: Oral cavity and pharynx without inflammation, swelling or lesion.  Lungs: Respirations even and unlabored. Lungs clear to auscultation bilaterally.   No wheezes, crackles, or rhonchi.  Heart: Normal S1, S2. No MRG. Regular rate and rhythm. No peripheral edema, cyanosis or pallor.  Abdomen:  Soft, nondistended, moderate tenderness, worse across the mid abdomen/periumbilical region, some involuntary guarding. No rebound. Normal bowel sounds. No appreciable masses or hepatomegaly. Rectal:  Not performed.  Msk:  Symmetrical without gross deformities.  Extremities:  Without edema, no deformity or joint abnormality. Neurologic:  Alert and  oriented x4;  grossly normal neurologically. Skin:   Dry and intact without significant lesions or rashes. Psychiatric: Oriented to person, place and time. Demonstrates good judgement and reason without abnormal affect or behaviors.   LAB RESULTS:  Recent Labs  11/09/2015 1510 10/31/15 0219  WBC 14.7* 12.2*  HGB 9.7* 8.0*  HCT 29.8* 25.0*  PLT 111* 88*   BMET  Recent Labs  11/06/2015 1510 10/31/15 0219  NA 137 139  K 3.1* 4.1  CL 102 108  CO2 23 22  GLUCOSE 124* 200*  BUN 27* 20  CREATININE 1.36* 1.41*  CALCIUM 8.4* 7.7*   LFT  Recent Labs  10/31/15 0219  PROT 5.2*  ALBUMIN 2.6*  AST 24  ALT 14  ALKPHOS 53  BILITOT 1.2   PT/INR  Recent Labs  10/22/2015 2245  LABPROT 13.0  INR 0.98    STUDIES: Dg Chest 2 View  Result Date: 11/02/2015 CLINICAL DATA:  Chest pain and coughing for 2 weeks. EXAM: CHEST  2 VIEW COMPARISON:  02/09/2015 FINDINGS: AP and lateral views of the chest show low volumes. The cardio pericardial silhouette is enlarged. There is pulmonary vascular congestion without overt pulmonary edema. The visualized bony structures of the thorax are intact. Telemetry leads  overlie the chest. IMPRESSION: Cardiomegaly with pulmonary vascular congestion. Electronically Signed   By: Misty Stanley M.D.   On: 11/17/2015 17:37   Ct Abdomen Pelvis W Contrast  Result Date: 11/16/2015 CLINICAL DATA:  Crohn's disease. Diffuse abdominal pain. Nausea, vomiting and diarrhea. EXAM: CT ABDOMEN AND PELVIS WITH CONTRAST TECHNIQUE: Multidetector CT imaging of the abdomen and pelvis was performed using the standard protocol following bolus administration of intravenous contrast. CONTRAST:  153mL ISOVUE-300 IOPAMIDOL (ISOVUE-300) INJECTION 61% COMPARISON:  MR enterography 10/27/2015 FINDINGS: Lower chest: No pulmonary nodules. No visible pleural or pericardial effusion. Hepatobiliary: Normal hepatic size and contours without focal liver lesion. No perihepatic ascites. No intra- or extrahepatic biliary dilatation. Normal gallbladder. Pancreas: Normal pancreatic contours and enhancement. No peripancreatic fluid collection or pancreatic ductal dilatation. Spleen: Normal. Adrenals/Urinary Tract: Normal adrenal glands. No hydronephrosis or solid renal mass. Stomach/Bowel: There is long segment bowel wall thickening and surrounding inflammatory stranding extending from the terminal ileum to the junction of the descending and sigmoid colon. There is again seen a suspected fistula between the terminal ileum  and an adjacent loop of right lower quadrant bowel (sagittal image 43, coronal image 51). The appendix is not visualized. No abscess or focal fluid collection. No dilated small bowel. No evidence of focal small bowel inflammation. Vascular/Lymphatic: Normal course and caliber of the major abdominal vessels. No abdominal or pelvic adenopathy. Reproductive: Hyperenhancing focus at the uterine fundus is likely a small fibroid. Ovaries are normal. Musculoskeletal: Multilevel osteophytosis and facet arthrosis. No bony spinal canal stenosis. No lytic or blastic lesions. Normal visualized extrathoracic and  extraperitoneal soft tissues. Other: No contributory non-categorized findings. IMPRESSION: 1. Long segment bowel inflammation extending from the terminal ileum to the junction of the descending and sigmoid colon, consistent with active Crohn's disease. No abscess or fluid collection. 2. 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 Electronically Signed   By: Ulyses Jarred M.D.   On: 11/16/2015 17:49     PREVIOUS ENDOSCOPIES:            See history of present illness, more in chart review

## 2015-10-31 NOTE — Progress Notes (Signed)
RT NOTE:  Pt does not wish to wear CPAP

## 2015-11-01 ENCOUNTER — Inpatient Hospital Stay (HOSPITAL_COMMUNITY): Payer: BC Managed Care – PPO

## 2015-11-01 DIAGNOSIS — K50113 Crohn's disease of large intestine with fistula: Secondary | ICD-10-CM

## 2015-11-01 LAB — GLUCOSE, CAPILLARY
GLUCOSE-CAPILLARY: 137 mg/dL — AB (ref 65–99)
GLUCOSE-CAPILLARY: 110 mg/dL — AB (ref 65–99)
Glucose-Capillary: 160 mg/dL — ABNORMAL HIGH (ref 65–99)
Glucose-Capillary: 158 mg/dL — ABNORMAL HIGH (ref 65–99)

## 2015-11-01 LAB — TROPONIN I: Troponin I: 0.09 ng/mL (ref <0.03)

## 2015-11-01 LAB — BASIC METABOLIC PANEL
Anion gap: 7 (ref 5–15)
BUN: 17 mg/dL (ref 6–20)
CALCIUM: 7.7 mg/dL — AB (ref 8.9–10.3)
CHLORIDE: 109 mmol/L (ref 101–111)
CO2: 22 mmol/L (ref 22–32)
CREATININE: 1.15 mg/dL — AB (ref 0.44–1.00)
GFR calc Af Amer: >60 mL/min (ref >60)
GFR calc non Af Amer: 53 mL/min — ABNORMAL LOW (ref >60)
Glucose, Bld: 146 mg/dL — ABNORMAL HIGH (ref 65–99)
Potassium: 3.8 mmol/L (ref 3.5–5.1)
SODIUM: 138 mmol/L (ref 135–145)

## 2015-11-01 LAB — CBC
HCT: 20.8 % — ABNORMAL LOW (ref 36.0–46.0)
Hemoglobin: 6.8 g/dL — CL (ref 12.0–15.0)
MCH: 28.8 pg (ref 26.0–34.0)
MCHC: 32.2 g/dL (ref 30.0–36.0)
MCV: 89.3 fL (ref 78.0–100.0)
Platelets: 94 10*3/uL — ABNORMAL LOW (ref 150–400)
RBC: 2.33 MIL/uL — ABNORMAL LOW (ref 3.87–5.11)
RDW: 19.9 % — ABNORMAL HIGH (ref 11.5–15.5)
WBC: 11.7 10*3/uL — ABNORMAL HIGH (ref 4.0–10.5)

## 2015-11-01 LAB — VITAMIN B12: Vitamin B-12: 707 pg/mL (ref 180–914)

## 2015-11-01 LAB — MAGNESIUM: Magnesium: 1.6 mg/dL — ABNORMAL LOW (ref 1.7–2.4)

## 2015-11-01 LAB — ABO/RH: ABO/RH(D): A POS

## 2015-11-01 LAB — PREPARE RBC (CROSSMATCH)

## 2015-11-01 MED ORDER — DIPHENHYDRAMINE HCL 25 MG PO CAPS
25.0000 mg | ORAL_CAPSULE | Freq: Once | ORAL | Status: AC
  Administered 2015-11-01: 25 mg via ORAL
  Filled 2015-11-01: qty 1

## 2015-11-01 MED ORDER — MAGNESIUM SULFATE 2 GM/50ML IV SOLN
2.0000 g | Freq: Once | INTRAVENOUS | Status: AC
  Administered 2015-11-01: 2 g via INTRAVENOUS
  Filled 2015-11-01: qty 50

## 2015-11-01 MED ORDER — SODIUM CHLORIDE 0.9% FLUSH
10.0000 mL | Freq: Two times a day (BID) | INTRAVENOUS | Status: DC
  Administered 2015-11-03 (×2): 10 mL
  Administered 2015-11-04: 20 mL
  Administered 2015-11-04 – 2015-11-06 (×4): 10 mL
  Administered 2015-11-07: 20 mL
  Administered 2015-11-08 – 2015-11-10 (×4): 10 mL
  Administered 2015-11-10 – 2015-11-11 (×3): 20 mL
  Administered 2015-11-12 – 2015-11-17 (×12): 10 mL
  Administered 2015-11-18: 20 mL
  Administered 2015-11-18: 10 mL
  Administered 2015-11-19: 20 mL
  Administered 2015-11-19 – 2015-11-27 (×10): 10 mL

## 2015-11-01 MED ORDER — SODIUM CHLORIDE 0.9% FLUSH
10.0000 mL | INTRAVENOUS | Status: DC | PRN
  Administered 2015-11-01: 20 mL
  Administered 2015-11-02 – 2015-11-13 (×3): 10 mL
  Filled 2015-11-01 (×4): qty 40

## 2015-11-01 MED ORDER — ACETAMINOPHEN 325 MG PO TABS
650.0000 mg | ORAL_TABLET | Freq: Once | ORAL | Status: AC
  Administered 2015-11-01: 650 mg via ORAL
  Filled 2015-11-01: qty 2

## 2015-11-01 MED ORDER — SODIUM CHLORIDE 0.9 % IV SOLN
Freq: Once | INTRAVENOUS | Status: AC
  Administered 2015-11-01: 06:00:00 via INTRAVENOUS

## 2015-11-01 NOTE — Progress Notes (Signed)
PROGRESS NOTE  Nancy Acevedo DOB: Mar 11, 1960 DOA: 11/07/2015 PCP: Binnie Rail, MD  Brief History:  55 y.o.femalewith medical history significant for hypertension, chronic kidney disease stage II, OSA, GERD, and recent diagnosis of Crohn's ileocolitis managed with prednisone who presentedto the [place] emergency department with 2-3 days of fevers, nausea, vomiting, and nonbloody diarrhea. Clinical picture was unclear until recently when when patient had MR enterography on 10/27/2015 showing likely Crohn's with entero-enterofistula, patient had been on prednisone40 mg daily and isin process of being started onRemicade.   Pt has had abd pain x 6 months resulting in colonoscopy on 07/31/15 (Dr. Ardis Hughs). This showed a large ulcer ileocecal valve, abnormal mucosa of the TI region, seven punctate, deep ulcers in the rt colon all about 6 mm across; Path-active inflammation with ulceration, no dysplasia.  Abdominal pain worsening in past 2 weeks to point patient "could hardly walk". Along with this the patient has been having at least 10 loose stools per day, but denies seeing any bright red blood or melena. The patient has had some fevers and nausea as well as vomiting within the past few days.   Patient has also had sharp substernal CP for past several months.  She claims increase dyspnea on exertion walking up her stairs at home along with exertional chest pain for 1-2 months.  She has had an extensive cardiac work up in January 2017 when she had exertional chest pain associated with syncope.  She underwent heart catheterization on 02/11/2015 which revealed normal coronaries with EF 55-60%. Typical cardiac rhythm at the time of discharge EF 60-65% with no wall motion and mellitus.  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 connecting terminal ileum to the small bowel -Continue IV Cipro and Flagyl -C diff negative  Sepsis -present at the time of admission -secondary to intraabdominal process -continue IVF -continue IV cipro/flagyl  Chest pain -some typical and atypical components -Completely reproducible on palpation of sternum -02/11/15 cardiac cath--normal coronaries -personally reviewed EKG--sinus, early repol, nonspecific T-wave changes -CXR-increase interstitial markings--not fluid overloaded clinically -Echocardiogram -02/11/2015 echo EF 60-65%, no WMA  Elevated troponin -demand ischemia in setting of sepsis  CKD 2 -Baseline creatinines are 1-1.2  Hypertension -Continue amlodipine and metoprolol tartrate -pt endorsed poor compliance with meds at home missing 2-3 days per week  Diabetes mellitus type 2 -10/02/2015 hemoglobin A1c 6.6 -Continue NovoLog sliding scale -pt not on any agents at home  GERD -10/22/14--EGD--The esophageal mucosa was abnormal appearing throughout but mainly in the proximal 1/2 of the esophagus. There were numerous linear and irregularly patterned furrows throughout.  Thrombocytopenia -likely due to sepsis, but pt with hx of B12 deficiency -not completely compliant with B12 supplements at home -check B12--707 -am CBC  Hypokalemia/hypomagnesemia -repleted -Check magnesium--1.6   Disposition Plan:   Not stable for d/c Family Communication:   Family at bedside updated 11/01/15--Total time spent 35 minutes.  Greater than 50% spent face to face counseling and coordinating care.   Consultants: Gi--[facility]   Code Status:  FULL  DVT Prophylaxis:  SCDs   Procedures: As Listed in Progress Note Above  Antibiotics: cipro 10/13>>> Flagyl  10/13>>>  Subjective: Overall, patient is feeling a little bit better. Abdominal pain  is somewhat improved. She describes her chest discomfort as reproducible and improving with improving shortness of breath. Denies any nausea, vomiting, hematochezia, melena. Denies any fevers, chills, headache, neck pain.  Objective: Vitals:   11/01/15 1111 11/01/15 1415 11/01/15 1544 11/01/15 1619  BP: 108/62 (!) 149/86 123/74 129/78  Pulse: (!) 54 63 62 66  Resp: 18 18 20 18   Temp: 98.5 F (36.9 C) 98.2 F (36.8 C) 97.8 F (36.6 C) 97.8 F (36.6 C)  TempSrc: Oral Oral Oral Oral  SpO2: 100% 100% 99% 100%  Weight:      Height:        Intake/Output Summary (Last 24 hours) at 11/01/15 1624 Last data filed at 11/01/15 1600  Gross per 24 hour  Intake              665 ml  Output                0 ml  Net              665 ml   Weight change: 2.223 kg (4 lb 14.4 oz) Exam:   General:  Pt is alert, follows commands appropriately, not in acute distress  HEENT: No icterus, No thrush, No neck mass, [place]/AT  Cardiovascular: RRR, S1/S2, no rubs, no gallops  Respiratory: CTA bilaterally, no wheezing, no crackles, no rhonchi  Abdomen: Soft/+BS, periumbilical tenderness without rebound, non distended, no guarding  Extremities: No edema, No lymphangitis, No petechiae, No rashes, no synovitis   Data Reviewed: I have personally reviewed following labs and imaging studies Basic Metabolic Panel:  Recent Labs Lab 11/16/2015 1510 11/09/2015 2245 10/31/15 0219 11/01/15 0146  NA 137  --  139 138  K 3.1*  --  4.1 3.8  CL 102  --  108 109  CO2 23  --  22 22  GLUCOSE 124*  --  200* 146*  BUN 27*  --  20 17  CREATININE 1.36*  --  1.41* 1.15*  CALCIUM 8.4*  --  7.7* 7.7*  MG  --  1.4*  --  1.6*   Liver Function Tests:  Recent Labs Lab 11/09/2015 1510 10/31/15 0219  AST 27 24  ALT 17 14  ALKPHOS 62 53  BILITOT 1.6* 1.2  PROT 6.5 5.2*  ALBUMIN 3.3* 2.6*    Recent Labs Lab 10/29/2015 1510   LIPASE 17   No results for input(s): AMMONIA in the last 168 hours. Coagulation Profile:  Recent Labs Lab 11/08/2015 2245  INR 0.98   CBC:  Recent Labs Lab 11/03/2015 1510 10/31/15 0219 11/01/15 0146  WBC 14.7* 12.2* 11.7*  NEUTROABS 12.8* 11.4*  --   HGB 9.7* 8.0* 6.8*  HCT 29.8* 25.0* 20.8*  MCV 91.4 88.7 89.3  PLT 111* 88* 94*   Cardiac Enzymes:  Recent Labs Lab 10/31/15 1516 10/31/15 2039 11/01/15 0146  TROPONINI 0.09* 0.11* 0.09*   BNP: Invalid input(s): POCBNP CBG:  Recent Labs Lab 10/31/15 1148 10/31/15 1635 10/31/15 2107 11/01/15 0559 11/01/15 1230  GLUCAP 155* 217* 146* 137* 110*   HbA1C: No results for input(s): HGBA1C in the last 72 hours. Urine analysis:    Component Value Date/Time   COLORURINE YELLOW 09/29/2015 [place] 09/29/2015 1140   LABSPEC 1.009 09/29/2015 1140   PHURINE 6.5 09/29/2015 1140   GLUCOSEU NEGATIVE 09/29/2015 1140   GLUCOSEU NEGATIVE 04/15/2015 1209   HGBUR NEGATIVE 09/29/2015 1140   BILIRUBINUR NEGATIVE 09/29/2015 1140   KETONESUR NEGATIVE 09/29/2015 1140   PROTEINUR  NEGATIVE 09/29/2015 1140   UROBILINOGEN 0.2 04/15/2015 1209   NITRITE NEGATIVE 09/29/2015 1140   LEUKOCYTESUR NEGATIVE 09/29/2015 1140   Sepsis Labs: @LABRCNTIP (procalcitonin:4,lacticidven:4) ) Recent Results (from the past 240 hour(s))  C difficile quick screen w PCR reflex     Status: None   Collection Time: 10/31/15 10:00 AM  Result Value Ref Range Status   C Diff antigen NEGATIVE NEGATIVE Final   C Diff toxin NEGATIVE NEGATIVE Final   C Diff interpretation No C. difficile detected.  Final     Scheduled Meds: . amLODipine  10 mg Oral Daily  . ciprofloxacin  400 mg Intravenous Q12H  . famotidine (PEPCID) IV  20 mg Intravenous Q12H  . insulin aspart  0-5 Units Subcutaneous QHS  . insulin aspart  0-9 Units Subcutaneous TID WC  . methylPREDNISolone (SOLU-MEDROL) injection  40 mg Intravenous Q12H  . metoprolol tartrate  50 mg  Oral BID  . metronidazole  500 mg Intravenous Q8H  . sodium chloride flush  10-40 mL Intracatheter Q12H  . sodium chloride flush  3 mL Intravenous Q12H   Continuous Infusions:   Procedures/Studies: Dg Chest 2 View  Result Date: 11/01/2015 CLINICAL DATA:  Pulmonary edema. EXAM: CHEST  2 VIEW COMPARISON:  11/03/2015 FINDINGS: Two views study shows upper normal heart size. Diffuse interstitial opacity suggests edema. No focal airspace consolidation or substantial pleural effusion. The visualized bony structures of the thorax are intact. Telemetry leads overlie the chest. IMPRESSION: Slight increase in diffuse interstitial opacities suggesting edema. Electronically Signed   By: Misty Stanley M.D.   On: 11/01/2015 08:23   Dg Chest 2 View  Result Date: 11/01/2015 CLINICAL DATA:  Chest pain and coughing for 2 weeks. EXAM: CHEST  2 VIEW COMPARISON:  02/09/2015 FINDINGS: AP and lateral views of the chest show low volumes. The cardio pericardial silhouette is enlarged. There is pulmonary vascular congestion without overt pulmonary edema. The visualized bony structures of the thorax are intact. Telemetry leads overlie the chest. IMPRESSION: Cardiomegaly with pulmonary vascular congestion. Electronically Signed   By: Misty Stanley M.D.   On: 10/29/2015 17:37   Ct Abdomen Pelvis W Contrast  Result Date: 11/01/2015 CLINICAL DATA:  Crohn's disease. Diffuse abdominal pain. Nausea, vomiting and diarrhea. EXAM: CT ABDOMEN AND PELVIS WITH CONTRAST TECHNIQUE: Multidetector CT imaging of the abdomen and pelvis was performed using the standard protocol following bolus administration of intravenous contrast. CONTRAST:  136mL ISOVUE-300 IOPAMIDOL (ISOVUE-300) INJECTION 61% COMPARISON:  MR enterography 10/27/2015 FINDINGS: Lower chest: No pulmonary nodules. No visible pleural or pericardial effusion. Hepatobiliary: Normal hepatic size and contours without focal liver lesion. No perihepatic ascites. No intra- or  extrahepatic biliary dilatation. Normal gallbladder. Pancreas: Normal pancreatic contours and enhancement. No peripancreatic fluid collection or pancreatic ductal dilatation. Spleen: Normal. Adrenals/Urinary Tract: Normal adrenal glands. No hydronephrosis or solid renal mass. Stomach/Bowel: There is long segment bowel wall thickening and surrounding inflammatory stranding extending from the terminal ileum to the junction of the descending and sigmoid colon. There is again seen a suspected fistula between the terminal ileum and an adjacent loop of right lower quadrant bowel (sagittal image 43, coronal image 51). The appendix is not visualized. No abscess or focal fluid collection. No dilated small bowel. No evidence of focal small bowel inflammation. Vascular/Lymphatic: Normal course and caliber of the major abdominal vessels. No abdominal or pelvic adenopathy. Reproductive: Hyperenhancing focus at the uterine fundus is likely a small fibroid. Ovaries are normal. Musculoskeletal: Multilevel osteophytosis and facet arthrosis. No bony spinal canal  stenosis. No lytic or blastic lesions. Normal visualized extrathoracic and extraperitoneal soft tissues. Other: No contributory non-categorized findings. IMPRESSION: 1. Long segment bowel inflammation extending from the terminal ileum to the junction of the descending and sigmoid colon, consistent with active Crohn's disease. No abscess or fluid collection. 2. 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 Electronically Signed   By: Ulyses Jarred M.D.   On: 11/10/2015 17:49   Mr Consuela Mimes W/o W/cm  Result Date: 10/27/2015 CLINICAL DATA:  55 year old female with Crohn disease and abdominal pain. History of appendectomy. EXAM: MR ABDOMEN AND PELVIS WITHOUT AND WITH CONTRAST (MR ENTEROGRAPHY) TECHNIQUE: Multiplanar, multisequence MRI of the abdomen and pelvis was performed both before and during bolus  administration of intravenous contrast. Negative oral contrast VoLumen was given. CONTRAST:  58mL MULTIHANCE GADOBENATE DIMEGLUMINE 529 MG/ML IV SOLN COMPARISON:  09/29/2015 CT abdomen/pelvis. FINDINGS: COMBINED FINDINGS FOR BOTH MR ABDOMEN AND PELVIS Many of the sequences are motion degraded. Lower chest: Clear lung bases. Stable mild elevation of the left hemidiaphragm. Hepatobiliary: Normal liver size and configuration. No liver mass. Normal gallbladder with no cholelithiasis. No biliary ductal dilatation. Common bile duct diameter 2 mm. No choledocholithiasis. Pancreas: No pancreatic mass or duct dilation.  No pancreas divisum. Spleen: Normal size. No mass. Adrenals/Urinary Tract: Normal adrenals. No hydronephrosis. Simple subcentimeter exophytic renal cyst in the far upper right kidney. Otherwise normal kidneys, with no suspicious renal mass. Stomach/Bowel: Normal stomach. Normal caliber small bowel. There is mild wall thickening and mucosal hyperenhancement in the terminal ileum (series 1201/ image 96). There is a closely approximated distal small bowel loop in the right abdomen anterior to the terminal ileum, with a suspected short 1 cm in length fistula between the terminal ileum and this adjacent right abdominal small bowel loop (series 14/ image 85). There is a separate approximately 5 cm in length loop of small bowel in the left abdomen demonstrating mild mucosal hyperenhancement (series 1201/ image 89). Otherwise no small bowel wall thickening or wall hyperenhancement. No focal small bowel caliber transition. Status post appendectomy. Normal caliber large bowel. No large bowel wall thickening or wall hyperenhancement. Mild descending and proximal sigmoid colonic diverticulosis. No pericolonic or mesenteric edema. Vascular/Lymphatic: Normal caliber abdominal aorta. Patent portal, splenic, hepatic and renal veins. No pathologically enlarged lymph nodes in the abdomen. Reproductive: Anterior upper uterine  body subserosal enhancing 1.8 x 1.6 x 1.9 cm fibroid. Unremarkable ovaries. No adnexal masses. Other: No abdominal ascites or focal fluid collection. Musculoskeletal: No aggressive appearing focal osseous lesions. IMPRESSION: 1. Mild active Crohn terminal ileitis. Suspected short 1 cm in length fistula between the terminal ileum and an adjacent right abdominal small bowel loop anterior to the terminal ileum. 2. Possible mild active Crohn enteritis within a separate short proximal left abdominal small bowel segment. 3. No abscess.  No evidence of bowel obstruction. 4. Solitary subserosal uterine fibroid. Electronically Signed   By: Ilona Sorrel M.D.   On: 10/27/2015 18:33   Mr [place] Pelvis W/o W/cm  Result Date: 10/27/2015 CLINICAL DATA:  55 year old female with Crohn disease and abdominal pain. History of appendectomy. EXAM: MR ABDOMEN AND PELVIS WITHOUT AND WITH CONTRAST (MR ENTEROGRAPHY) TECHNIQUE: Multiplanar, multisequence MRI of the abdomen and pelvis was performed both before and during bolus administration of intravenous contrast. Negative oral contrast VoLumen was given. CONTRAST:  109mL MULTIHANCE GADOBENATE DIMEGLUMINE 529 MG/ML IV SOLN COMPARISON:  09/29/2015 CT abdomen/pelvis. FINDINGS: COMBINED FINDINGS FOR BOTH MR ABDOMEN  AND PELVIS Many of the sequences are motion degraded. Lower chest: Clear lung bases. Stable mild elevation of the left hemidiaphragm. Hepatobiliary: Normal liver size and configuration. No liver mass. Normal gallbladder with no cholelithiasis. No biliary ductal dilatation. Common bile duct diameter 2 mm. No choledocholithiasis. Pancreas: No pancreatic mass or duct dilation.  No pancreas divisum. Spleen: Normal size. No mass. Adrenals/Urinary Tract: Normal adrenals. No hydronephrosis. Simple subcentimeter exophytic renal cyst in the far upper right kidney. Otherwise normal kidneys, with no suspicious renal mass. Stomach/Bowel: Normal stomach. Normal caliber small bowel. There is  mild wall thickening and mucosal hyperenhancement in the terminal ileum (series 1201/ image 96). There is a closely approximated distal small bowel loop in the right abdomen anterior to the terminal ileum, with a suspected short 1 cm in length fistula between the terminal ileum and this adjacent right abdominal small bowel loop (series 14/ image 85). There is a separate approximately 5 cm in length loop of small bowel in the left abdomen demonstrating mild mucosal hyperenhancement (series 1201/ image 89). Otherwise no small bowel wall thickening or wall hyperenhancement. No focal small bowel caliber transition. Status post appendectomy. Normal caliber large bowel. No large bowel wall thickening or wall hyperenhancement. Mild descending and proximal sigmoid colonic diverticulosis. No pericolonic or mesenteric edema. Vascular/Lymphatic: Normal caliber abdominal aorta. Patent portal, splenic, hepatic and renal veins. No pathologically enlarged lymph nodes in the abdomen. Reproductive: Anterior upper uterine body subserosal enhancing 1.8 x 1.6 x 1.9 cm fibroid. Unremarkable ovaries. No adnexal masses. Other: No abdominal ascites or focal fluid collection. Musculoskeletal: No aggressive appearing focal osseous lesions. IMPRESSION: 1. Mild active Crohn terminal ileitis. Suspected short 1 cm in length fistula between the terminal ileum and an adjacent right abdominal small bowel loop anterior to the terminal ileum. 2. Possible mild active Crohn enteritis within a separate short proximal left abdominal small bowel segment. 3. No abscess.  No evidence of bowel obstruction. 4. Solitary subserosal uterine fibroid. Electronically Signed   By: Ilona Sorrel M.D.   On: 10/27/2015 18:33    Zetta Stoneman, DO  [facility] Pager [phone]  If 7PM-7AM, please contact night-coverage [email or web address] Password [email or web address] 11/01/2015, 4:24 PM   LOS: 2 days

## 2015-11-01 NOTE — Progress Notes (Signed)
Peripherally Inserted Central Catheter/Midline Placement  The IV Nurse has discussed with the patient and/or persons authorized to consent for the patient, the purpose of this procedure and the potential benefits and risks involved with this procedure.  The benefits include less needle sticks, lab draws from the catheter, and the patient may be discharged home with the catheter. Risks include, but not limited to, infection, bleeding, blood clot (thrombus formation), and puncture of an artery; nerve damage and irregular heartbeat and possibility to perform a PICC exchange if needed/ordered by physician.  Alternatives to this procedure were also discussed.  B[employer] Power PICC patient education guide, fact sheet on infection prevention and patient information card has been provided to patient /or left at bedside.    PICC/Midline Placement Documentation  PICC Double Lumen 11/01/15 PICC Right Brachial 40 cm 1 cm (Active)  Indication for Insertion or Continuance of Line Limited venous access - need for IV therapy >5 days (PICC only) 11/01/2015 11:58 AM  Exposed Catheter (cm) 1 cm 11/01/2015 11:58 AM  Site Assessment Clean;Dry;Intact 11/01/2015 11:58 AM  Lumen #1 Status Flushed;Saline locked;Blood return noted 11/01/2015 11:58 AM  Lumen #2 Status Flushed;Saline locked;Blood return noted 11/01/2015 11:58 AM  Dressing Type Transparent 11/01/2015 11:58 AM  Dressing Status Clean;Dry;Intact 11/01/2015 11:58 AM  Dressing Change Due 11/08/15 11/01/2015 11:58 AM       [facility]ordan Payment 11/01/2015, 12:00 PM

## 2015-11-01 NOTE — Progress Notes (Signed)
ANTIBIOTIC CONSULT NOTE   Pharmacy Consult for Cipro  Indication: Crohn's collitis  Allergies  Allergen Reactions  . Lexapro [Escitalopram] Itching  . Losartan   . Spironolactone     rash    Patient Measurements: Height: 5\' 4"  (162.6 cm) Weight: 195 lb 14.4 oz (88.9 kg) IBW/kg (Calculated) : 54.7 Adjusted Body Weight:   Vital Signs: Temp: 98 F (36.7 C) (10/15 0858) Temp Source: Oral (10/15 0858) BP: 132/73 (10/15 0858) Pulse Rate: 60 (10/15 0858) Intake/Output from previous day: 10/14 0701 - 10/15 0700 In: 513 [P.O.:513] Out: 1 [Stool:1] Intake/Output from this shift: Total I/O In: 30 [Blood:30] Out: -   Labs:  Recent Labs  10/25/2015 1510 10/31/15 0219 11/01/15 0146  WBC 14.7* 12.2* 11.7*  HGB 9.7* 8.0* 6.8*  PLT 111* 88* 94*  CREATININE 1.36* 1.41* 1.15*   Estimated Creatinine Clearance: 61.1 mL/min (by C-G formula based on SCr of 1.15 mg/dL (H)). No results for input(s): VANCOTROUGH, VANCOPEAK, VANCORANDOM, GENTTROUGH, GENTPEAK, GENTRANDOM, TOBRATROUGH, TOBRAPEAK, TOBRARND, AMIKACINPEAK, AMIKACINTROU, AMIKACIN in the last 72 hours.   Microbiology: Recent Results (from the past 720 hour(s))  C difficile quick screen w PCR reflex     Status: None   Collection Time: 10/31/15 10:00 AM  Result Value Ref Range Status   C Diff antigen NEGATIVE NEGATIVE Final   C Diff toxin NEGATIVE NEGATIVE Final   C Diff interpretation No C. difficile detected.  Final    Medical History: Past Medical History:  Diagnosis Date  . Candidiasis of mouth    History   . Complication of anesthesia    " SLOW WAKING UP "  . 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 Diabetes mellitus, type 2 ([place]) dx 01/2011   borderline - no current medications, diet controlled  . EP (ectopic pregnancy)   . GERD (gastroesophageal reflux disease)    no meds, diet controlled  . Gout    hx - no current problem  . Headaches, cluster    otc med prn  .  History of hyperglycemia 1993  . Hypertension   . OSA (obstructive sleep apnea)    uses CPAP nightly  . Sleep apnea    Uses CPAP every night  . Syncope 02/09/15  . Syncope 02/10/2015   Assessment:  ID: afebrile. WBC 14.7>12.2>11.7.Scr 1.15 back down. Cdiff negative  GI: pt did not take anything for acid reflux PTA per discussion with her)  Antimicrobials this admission:  Cipro 10/13>> Flagyl 10/13>>  Goal of Therapy:  Eradication of infection  Plan:  Cipro 400mg  IV q12h. Dose ok for renal function. Pharmacy will sign off. Please reconsult for further dosing assitance.  Demarious Kapur S. Alford Highland, PharmD, BCPS Clinical Staff Pharmacist Pager [phone]  Eilene Ghazi Stillinger 11/01/2015,10:22 AM

## 2015-11-01 NOTE — Progress Notes (Signed)
    Progress Note  Assessment / Plan:   Assessment: 1. 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 GERD: Last EGD shows esophagitis, patient on twice a day PPI 3. Normocytic anemia, thrombocytopenia: Hemoglobin 9.7 on admission, new thrombocytopenia with platelets 111,000 on admission, no active bleeding 4. CK D stage II 5. Hypokalemia  Plan: 1. Continue Solu-Medrol 40 mg IV twice a day for now, this will be titrated down in the future 2. Continue IV Cipro and Flagyl 3. Continue IV fluids, when necessary pain meds 4. We will likely initiate Remicade during patient's stay - waiting for quantiferon results which need to be negative to start 5. Will discuss with Dr. Carlean Purl, please await any further recommendations  Thank you for your kind consultation, we will continue to follow.    LOS: 2 days   Lavone Nian [facility]  11/01/2015, 10:07 AM  Pager # [phone]    [facility] Attending   I have taken an interval history, reviewed the chart and examined the patient. I agree with the Advanced Practitioner's note, impression and recommendations.    Gatha Mayer, MD, [facility] [phone] (pager) [phone] after 5 PM, weekends and holidays  11/01/2015 2:48 PM   Subjective  Chief Complaint: Crohns ileitis  Pt doing much better today, abdominal pain 7/10, only 3 bm in last 24 h.  Denies any new complaints.    Objective   Vital signs in last 24 hours: Temp:  [97.9 F (36.6 C)-98.2 F (36.8 C)] 98 F (36.7 C) (10/15 0858) Pulse Rate:  [60-76] 60 (10/15 0858) Resp:  [18] 18 (10/15 0858) BP: (109-132)/(62-84) 132/73 (10/15 0858) SpO2:  [95 %-100 %] 100 % (10/15 0858) Weight:  [195 lb 14.4 oz (88.9 kg)] 195 lb 14.4 oz (88.9 kg) (10/15 0456) Last BM Date: 10/31/15 General:African American female in NAD Heart:  Regular rate and rhythm; no murmurs Lungs: Respirations even and unlabored, lungs CTA bilaterally Abdomen:  Soft, mild ttp in the periumbilical region and nondistended. Normal bowel sounds. Extremities:  Without edema. Neurologic:  Alert and oriented,  grossly normal neurologically. Psych:  Cooperative. Normal mood and affect.   Lab Results:  Recent Labs  11/16/2015 1510 10/31/15 0219 11/01/15 0146  WBC 14.7* 12.2* 11.7*  HGB 9.7* 8.0* 6.8*  HCT 29.8* 25.0* 20.8*  PLT 111* 88* 94*   BMET  Recent Labs  10/26/2015 1510 10/31/15 0219 11/01/15 0146  NA 137 139 138  K 3.1* 4.1 3.8  CL 102 108 109  CO2 23 22 22   GLUCOSE 124* 200* 146*  BUN 27* 20 17  CREATININE 1.36* 1.41* 1.15*  CALCIUM 8.4* 7.7* 7.7*

## 2015-11-02 ENCOUNTER — Inpatient Hospital Stay (HOSPITAL_COMMUNITY): Payer: BC Managed Care – PPO

## 2015-11-02 ENCOUNTER — Other Ambulatory Visit (HOSPITAL_COMMUNITY): Payer: BC Managed Care – PPO

## 2015-11-02 DIAGNOSIS — R079 Chest pain, unspecified: Secondary | ICD-10-CM

## 2015-11-02 DIAGNOSIS — D649 Anemia, unspecified: Secondary | ICD-10-CM

## 2015-11-02 LAB — BASIC METABOLIC PANEL
ANION GAP: 9 (ref 5–15)
BUN: 14 mg/dL (ref 6–20)
CALCIUM: 8.4 mg/dL — AB (ref 8.9–10.3)
CO2: 22 mmol/L (ref 22–32)
Chloride: 108 mmol/L (ref 101–111)
Creatinine, Ser: 1.21 mg/dL — ABNORMAL HIGH (ref 0.44–1.00)
GFR, EST AFRICAN AMERICAN: 58 mL/min — AB (ref >60)
GFR, EST NON AFRICAN AMERICAN: 50 mL/min — AB (ref >60)
Glucose, Bld: 163 mg/dL — ABNORMAL HIGH (ref 65–99)
POTASSIUM: 3.9 mmol/L (ref 3.5–5.1)
Sodium: 139 mmol/L (ref 135–145)

## 2015-11-02 LAB — GLUCOSE, CAPILLARY
GLUCOSE-CAPILLARY: 152 mg/dL — AB (ref 65–99)
GLUCOSE-CAPILLARY: 127 mg/dL — AB (ref 65–99)
Glucose-Capillary: 88 mg/dL (ref 65–99)
Glucose-Capillary: 64 mg/dL — ABNORMAL LOW (ref 65–99)
Glucose-Capillary: 98 mg/dL (ref 65–99)

## 2015-11-02 LAB — QUANTIFERON TB GOLD ASSAY (BLOOD)
MITOGEN-NIL SO: 0.05 [IU]/mL
QUANTIFERON NIL VALUE: 0.02 [IU]/mL
Quantiferon Tb Ag Minus Nil Value: 0.01 IU/mL

## 2015-11-02 LAB — ECHOCARDIOGRAM COMPLETE
HEIGHTINCHES: 64 in
WEIGHTICAEL: 3153.6 [oz_av]

## 2015-11-02 LAB — TYPE AND SCREEN
ABO/RH(D): A POS
ANTIBODY SCREEN: NEGATIVE
UNIT DIVISION: 0
Unit division: 0

## 2015-11-02 LAB — CBC
HEMATOCRIT: 29.8 % — AB (ref 36.0–46.0)
HEMOGLOBIN: 9.8 g/dL — AB (ref 12.0–15.0)
MCH: 28.4 pg (ref 26.0–34.0)
MCHC: 32.9 g/dL (ref 30.0–36.0)
MCV: 86.4 fL (ref 78.0–100.0)
Platelets: 128 10*3/uL — ABNORMAL LOW (ref 150–400)
RBC: 3.45 MIL/uL — AB (ref 3.87–5.11)
RDW: 17.9 % — ABNORMAL HIGH (ref 11.5–15.5)
WBC: 9.6 10*3/uL (ref 4.0–10.5)

## 2015-11-02 LAB — MAGNESIUM: MAGNESIUM: 2.4 mg/dL (ref 1.7–2.4)

## 2015-11-02 MED ORDER — METHYLPREDNISOLONE SODIUM SUCC 125 MG IJ SOLR: 60.0000 mg | INTRAMUSCULAR | Status: DC

## 2015-11-02 MED ORDER — METHYLPREDNISOLONE SODIUM SUCC 40 MG IJ SOLR
20.0000 mg | Freq: Once | INTRAMUSCULAR | Status: DC
  Filled 2015-11-02: qty 1

## 2015-11-02 MED ORDER — ENOXAPARIN SODIUM 40 MG/0.4ML ~~LOC~~ SOLN
40.0000 mg | SUBCUTANEOUS | Status: DC
Start: 2015-11-02 — End: 2015-11-17
  Administered 2015-11-02 – 2015-11-17 (×16): 40 mg via SUBCUTANEOUS
  Filled 2015-11-02 (×16): qty 0.4

## 2015-11-02 MED ORDER — CIPROFLOXACIN HCL 500 MG PO TABS
500.0000 mg | ORAL_TABLET | Freq: Two times a day (BID) | ORAL | Status: DC
  Administered 2015-11-02: 500 mg via ORAL
  Filled 2015-11-02 (×2): qty 1

## 2015-11-02 MED ORDER — FAMOTIDINE 20 MG PO TABS
20.0000 mg | ORAL_TABLET | Freq: Two times a day (BID) | ORAL | Status: DC
  Administered 2015-11-02 – 2015-11-07 (×12): 20 mg via ORAL
  Filled 2015-11-02 (×13): qty 1

## 2015-11-02 MED ORDER — METHYLPREDNISOLONE SODIUM SUCC 125 MG IJ SOLR
60.0000 mg | INTRAMUSCULAR | Status: DC
  Administered 2015-11-03: 60 mg via INTRAVENOUS
  Filled 2015-11-02: qty 2

## 2015-11-02 MED ORDER — METRONIDAZOLE 500 MG PO TABS
500.0000 mg | ORAL_TABLET | Freq: Three times a day (TID) | ORAL | Status: DC
  Administered 2015-11-02 (×2): 500 mg via ORAL
  Filled 2015-11-02 (×2): qty 1

## 2015-11-02 NOTE — Progress Notes (Signed)
*  PRELIMINARY RESULTS* Echocardiogram 2D Echocardiogram has been performed.  Leavy Cella 11/02/2015, 10:05 AM

## 2015-11-02 NOTE — Progress Notes (Signed)
PROGRESS NOTE  Nancy Acevedo [id number] DOB: 1960/11/30 DOA: 10/26/2015 PCP: Binnie Rail, MD  Brief History: 55 y.o.femalewith medical history significant for hypertension, chronic kidney disease stage II, OSA, GERD, and recent diagnosis of Crohn's ileocolitis managed with prednisone who presentedto the [place] emergency department with 2-3 days of fevers, nausea, vomiting, and nonbloody diarrhea. Clinical picture was unclear until recently when when patient had MR enterography on 10/27/2015 showing likely Crohn's with entero-enterofistula, patient had been on prednisone40 mg daily and isin process of being started onRemicade. Pt has had abd pain x 6 months resulting in colonoscopy on 07/31/15 (Dr. Ardis Hughs). This showed a largeulcer ileocecal valve, abnormal mucosa of the TI region, seven punctate, deep ulcers in the rt colon all about 6 mm across; Path-active inflammation with ulceration, no dysplasia. Abdominal pain worsening in past 2 weeks to point patient "could hardly walk". Along with this the patient has been having at least 10 loose stools per day, but denies seeing any bright red blood or melena. The patient has had some fevers and nausea as well as vomiting within the past few days.   Patient has also had sharp substernal CP for past several months. She claims increase dyspnea on exertion walking up her stairs at home along with exertional chest pain for 1-2 months. She has had an extensive cardiac work upin January 2017 when she had exertional chest pain associated with syncope. She underwent heart catheterization on 02/11/2015 which revealed normal coronaries with EF 55-60%. Typical cardiac rhythm at the time of discharge EF 60-65% with no wall motion and mellitus.  Assessment/Plan: Crohn's ileocolitis with entero-enterofistula and flare -appreciate GI consult -continue IV solumedrol--dose adjustment per GI-->to 60 mg daily -continue  IVF -diet per GI service-->full liquids -10/22/2015 CT abdomen and pelvis--long segment of bowel wall thickening with surrounding inflammation from terminal ileum to the sigmoid with suspected RLQentero-antral fistula connecting terminal ileum to the small bowel -Continue IV Cipro and Flagyl -C diff negative -initially not on pharmacologic DVT prophylaxis due to thrombocytopenia  Sepsis -present at the time of admission -secondary to intraabdominal process -continue IVF -continue IV cipro/flagyl -sepsis physiology improved  Chest pain -some typical and atypical components -Completely reproducible on palpation of sternum -02/11/15 cardiac cath--normal coronaries -personally reviewed EKG--sinus, early repol, nonspecific T-wave changes -CXR-increase interstitial markings--not fluid overloaded clinically -Echocardiogram--pending -02/11/2015 echo EF 60-65%, no WMA  Elevated troponin -demand ischemia in setting of sepsis  CKD 2 -Baseline creatinine 1-1.2  Hypertension -Continue amlodipine and metoprolol tartrate -pt endorsed poor compliance with meds at home missing 2-3 days per week  Diabetes mellitus type 2 -10/02/2015 hemoglobin A1c 6.6 -Continue NovoLog sliding scale -pt not on any agents at home -elevated CBGs due to steroids  GERD -10/22/14--EGD--The esophageal mucosa was abnormal appearing throughout but mainly in the proximal 1/2 of the esophagus. There were numerous linear and irregularly patterned furrows throughout.  Thrombocytopenia -likely due to sepsis, but pt with hx of B12 deficiency -not completely compliant with B12 supplements at home -check B12--707 -am CBC  Hypokalemia/hypomagnesemia -repleted -Check magnesium--1.6-->2.2  Anemia -transfused 2 units PRBCs 11/01/15   Disposition Plan: Not stable for d/c Family Communication: Family at bedside updated 11/01/15--Total time spent 69minutes. Greater than 50% spent face to face  counseling and coordinating care.   Consultants: Gi--[facility]  Code Status: FULL  DVT Prophylaxis: SCDs   Procedures: As Listed in Progress Note Above  Antibiotics: cipro 10/13>>> Flagyl 10/13>>>  Subjective: Patient was tearful and upset this morning wanted to leave [place]. After speaking with her, she changed her mind. States that abdominal pain is improving. Denies any nausea, vomiting, diarrhea. States that breathing is improving. Still continues to have substernal chest pain reproducible by palpation. Denies any hematochezia, melena, headache, neck pain.  Objective: Vitals:   11/01/15 1544 11/01/15 1619 11/01/15 1918 11/02/15 0400  BP: 123/74 129/78 140/86 (!) 152/93  Pulse: 62 66 68 (!) 58  Resp: 20 18 18 18   Temp: 97.8 F (36.6 C) 97.8 F (36.6 C) 97.9 F (36.6 C) 98.4 F (36.9 C)  TempSrc: Oral Oral Oral Oral  SpO2: 99% 100% 99% 96%  Weight:    89.4 kg (197 lb 1.6 oz)  Height:        Intake/Output Summary (Last 24 hours) at 11/02/15 0958 Last data filed at 11/01/15 1900  Gross per 24 hour  Intake              940 ml  Output                0 ml  Net              940 ml   Weight change: 0.544 kg (1 lb 3.2 oz) Exam:   General:  Pt is alert, follows commands appropriately, not in acute distress  HEENT: No icterus, No thrush, No neck mass, [place]/AT  Cardiovascular: RRR, S1/S2, no rubs, no gallops  Respiratory: CTA bilaterally, no wheezing, no crackles, no rhonchi  Abdomen: Soft/+BS, non tender, non distended, no guarding  Extremities: No edema, No lymphangitis, No petechiae, No rashes, no synovitis   Data Reviewed: I have personally reviewed following labs and imaging studies Basic Metabolic Panel:  Recent Labs Lab 11/06/2015 1510 11/02/2015 2245 10/31/15 0219 11/01/15 0146 11/02/15 0415  NA 137  --  139 138 139  K 3.1*  --  4.1 3.8 3.9  CL 102  --  108 109 108  CO2 23  --  22 22 22   GLUCOSE 124*  --  200* 146*  163*  BUN 27*  --  20 17 14   CREATININE 1.36*  --  1.41* 1.15* 1.21*  CALCIUM 8.4*  --  7.7* 7.7* 8.4*  MG  --  1.4*  --  1.6* 2.4   Liver Function Tests:  Recent Labs Lab 11/08/2015 1510 10/31/15 0219  AST 27 24  ALT 17 14  ALKPHOS 62 53  BILITOT 1.6* 1.2  PROT 6.5 5.2*  ALBUMIN 3.3* 2.6*    Recent Labs Lab 11/01/2015 1510  LIPASE 17   No results for input(s): AMMONIA in the last 168 hours. Coagulation Profile:  Recent Labs Lab 10/21/2015 2245  INR 0.98   CBC:  Recent Labs Lab 11/13/2015 1510 10/31/15 0219 11/01/15 0146 11/02/15 0415  WBC 14.7* 12.2* 11.7* 9.6  NEUTROABS 12.8* 11.4*  --   --   HGB 9.7* 8.0* 6.8* 9.8*  HCT 29.8* 25.0* 20.8* 29.8*  MCV 91.4 88.7 89.3 86.4  PLT 111* 88* 94* 128*   Cardiac Enzymes:  Recent Labs Lab 10/31/15 1516 10/31/15 2039 11/01/15 0146  TROPONINI 0.09* 0.11* 0.09*   BNP: Invalid input(s): POCBNP CBG:  Recent Labs Lab 11/01/15 0559 11/01/15 1230 11/01/15 1630 11/01/15 2200 11/02/15 0613  GLUCAP 137* 110* 160* 158* 152*   HbA1C: No results for input(s): HGBA1C in the last 72 hours. Urine analysis:    Component Value Date/Time   COLORURINE YELLOW 09/29/2015 1140  APPEARANCEUR CLEAR 09/29/2015 1140   LABSPEC 1.009 09/29/2015 1140   PHURINE 6.5 09/29/2015 1140   GLUCOSEU NEGATIVE 09/29/2015 1140   GLUCOSEU NEGATIVE 04/15/2015 1209   HGBUR NEGATIVE 09/29/2015 1140   BILIRUBINUR NEGATIVE 09/29/2015 1140   KETONESUR NEGATIVE 09/29/2015 1140   PROTEINUR NEGATIVE 09/29/2015 1140   UROBILINOGEN 0.2 04/15/2015 1209   NITRITE NEGATIVE 09/29/2015 1140   LEUKOCYTESUR NEGATIVE 09/29/2015 1140   Sepsis Labs: @LABRCNTIP (procalcitonin:4,lacticidven:4) ) Recent Results (from the past 240 hour(s))  C difficile quick screen w PCR reflex     Status: None   Collection Time: 10/31/15 10:00 AM  Result Value Ref Range Status   C Diff antigen NEGATIVE NEGATIVE Final   C Diff toxin NEGATIVE NEGATIVE Final   C Diff  interpretation No C. difficile detected.  Final     Scheduled Meds: . amLODipine  10 mg Oral Daily  . ciprofloxacin  500 mg Oral BID  . enoxaparin (LOVENOX) injection  40 mg Subcutaneous Q24H  . famotidine  20 mg Oral BID  . insulin aspart  0-5 Units Subcutaneous QHS  . insulin aspart  0-9 Units Subcutaneous TID WC  . [START ON 11/03/2015] methylPREDNISolone (SOLU-MEDROL) injection  60 mg Intravenous Q24H  . methylPREDNISolone (SOLU-MEDROL) injection  20 mg Intravenous Once  . metoprolol tartrate  50 mg Oral BID  . metroNIDAZOLE  500 mg Oral Q8H  . sodium chloride flush  10-40 mL Intracatheter Q12H  . sodium chloride flush  3 mL Intravenous Q12H   Continuous Infusions:   Procedures/Studies: Dg Chest 2 View  Result Date: 11/01/2015 CLINICAL DATA:  Pulmonary edema. EXAM: CHEST  2 VIEW COMPARISON:  10/28/2015 FINDINGS: Two views study shows upper normal heart size. Diffuse interstitial opacity suggests edema. No focal airspace consolidation or substantial pleural effusion. The visualized bony structures of the thorax are intact. Telemetry leads overlie the chest. IMPRESSION: Slight increase in diffuse interstitial opacities suggesting edema. Electronically Signed   By: Misty Stanley M.D.   On: 11/01/2015 08:23   Dg Chest 2 View  Result Date: 10/24/2015 CLINICAL DATA:  Chest pain and coughing for 2 weeks. EXAM: CHEST  2 VIEW COMPARISON:  02/09/2015 FINDINGS: AP and lateral views of the chest show low volumes. The cardio pericardial silhouette is enlarged. There is pulmonary vascular congestion without overt pulmonary edema. The visualized bony structures of the thorax are intact. Telemetry leads overlie the chest. IMPRESSION: Cardiomegaly with pulmonary vascular congestion. Electronically Signed   By: Misty Stanley M.D.   On: 11/08/2015 17:37   Ct Abdomen Pelvis W Contrast  Result Date: 10/22/2015 CLINICAL DATA:  Crohn's disease. Diffuse abdominal pain. Nausea, vomiting and diarrhea.  EXAM: CT ABDOMEN AND PELVIS WITH CONTRAST TECHNIQUE: Multidetector CT imaging of the abdomen and pelvis was performed using the standard protocol following bolus administration of intravenous contrast. CONTRAST:  132mL ISOVUE-300 IOPAMIDOL (ISOVUE-300) INJECTION 61% COMPARISON:  MR enterography 10/27/2015 FINDINGS: Lower chest: No pulmonary nodules. No visible pleural or pericardial effusion. Hepatobiliary: Normal hepatic size and contours without focal liver lesion. No perihepatic ascites. No intra- or extrahepatic biliary dilatation. Normal gallbladder. Pancreas: Normal pancreatic contours and enhancement. No peripancreatic fluid collection or pancreatic ductal dilatation. Spleen: Normal. Adrenals/Urinary Tract: Normal adrenal glands. No hydronephrosis or solid renal mass. Stomach/Bowel: There is long segment bowel wall thickening and surrounding inflammatory stranding extending from the terminal ileum to the junction of the descending and sigmoid colon. There is again seen a suspected fistula between the terminal ileum and an adjacent loop of right lower  quadrant bowel (sagittal image 43, coronal image 51). The appendix is not visualized. No abscess or focal fluid collection. No dilated small bowel. No evidence of focal small bowel inflammation. Vascular/Lymphatic: Normal course and caliber of the major abdominal vessels. No abdominal or pelvic adenopathy. Reproductive: Hyperenhancing focus at the uterine fundus is likely a small fibroid. Ovaries are normal. Musculoskeletal: Multilevel osteophytosis and facet arthrosis. No bony spinal canal stenosis. No lytic or blastic lesions. Normal visualized extrathoracic and extraperitoneal soft tissues. Other: No contributory non-categorized findings. IMPRESSION: 1. Long segment bowel inflammation extending from the terminal ileum to the junction of the descending and sigmoid colon, consistent with active Crohn's disease. No abscess or fluid collection. 2. 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 Electronically Signed   By: Ulyses Jarred M.D.   On: 10/20/2015 17:49   Mr Consuela Mimes W/o W/cm  Result Date: 10/27/2015 CLINICAL DATA:  55 year old female with Crohn disease and abdominal pain. History of appendectomy. EXAM: MR ABDOMEN AND PELVIS WITHOUT AND WITH CONTRAST (MR ENTEROGRAPHY) TECHNIQUE: Multiplanar, multisequence MRI of the abdomen and pelvis was performed both before and during bolus administration of intravenous contrast. Negative oral contrast VoLumen was given. CONTRAST:  69mL MULTIHANCE GADOBENATE DIMEGLUMINE 529 MG/ML IV SOLN COMPARISON:  09/29/2015 CT abdomen/pelvis. FINDINGS: COMBINED FINDINGS FOR BOTH MR ABDOMEN AND PELVIS Many of the sequences are motion degraded. Lower chest: Clear lung bases. Stable mild elevation of the left hemidiaphragm. Hepatobiliary: Normal liver size and configuration. No liver mass. Normal gallbladder with no cholelithiasis. No biliary ductal dilatation. Common bile duct diameter 2 mm. No choledocholithiasis. Pancreas: No pancreatic mass or duct dilation.  No pancreas divisum. Spleen: Normal size. No mass. Adrenals/Urinary Tract: Normal adrenals. No hydronephrosis. Simple subcentimeter exophytic renal cyst in the far upper right kidney. Otherwise normal kidneys, with no suspicious renal mass. Stomach/Bowel: Normal stomach. Normal caliber small bowel. There is mild wall thickening and mucosal hyperenhancement in the terminal ileum (series 1201/ image 96). There is a closely approximated distal small bowel loop in the right abdomen anterior to the terminal ileum, with a suspected short 1 cm in length fistula between the terminal ileum and this adjacent right abdominal small bowel loop (series 14/ image 85). There is a separate approximately 5 cm in length loop of small bowel in the left abdomen demonstrating mild mucosal hyperenhancement (series 1201/ image 89).  Otherwise no small bowel wall thickening or wall hyperenhancement. No focal small bowel caliber transition. Status post appendectomy. Normal caliber large bowel. No large bowel wall thickening or wall hyperenhancement. Mild descending and proximal sigmoid colonic diverticulosis. No pericolonic or mesenteric edema. Vascular/Lymphatic: Normal caliber abdominal aorta. Patent portal, splenic, hepatic and renal veins. No pathologically enlarged lymph nodes in the abdomen. Reproductive: Anterior upper uterine body subserosal enhancing 1.8 x 1.6 x 1.9 cm fibroid. Unremarkable ovaries. No adnexal masses. Other: No abdominal ascites or focal fluid collection. Musculoskeletal: No aggressive appearing focal osseous lesions. IMPRESSION: 1. Mild active Crohn terminal ileitis. Suspected short 1 cm in length fistula between the terminal ileum and an adjacent right abdominal small bowel loop anterior to the terminal ileum. 2. Possible mild active Crohn enteritis within a separate short proximal left abdominal small bowel segment. 3. No abscess.  No evidence of bowel obstruction. 4. Solitary subserosal uterine fibroid. Electronically Signed   By: Ilona Sorrel M.D.   On: 10/27/2015 18:33   Mr [place] Pelvis W/o W/cm  Result Date: 10/27/2015 CLINICAL DATA:  55 year old female with Crohn disease and abdominal pain. History of appendectomy. EXAM: MR ABDOMEN AND PELVIS WITHOUT AND WITH CONTRAST (MR ENTEROGRAPHY) TECHNIQUE: Multiplanar, multisequence MRI of the abdomen and pelvis was performed both before and during bolus administration of intravenous contrast. Negative oral contrast VoLumen was given. CONTRAST:  49mL MULTIHANCE GADOBENATE DIMEGLUMINE 529 MG/ML IV SOLN COMPARISON:  09/29/2015 CT abdomen/pelvis. FINDINGS: COMBINED FINDINGS FOR BOTH MR ABDOMEN AND PELVIS Many of the sequences are motion degraded. Lower chest: Clear lung bases. Stable mild elevation of the left hemidiaphragm. Hepatobiliary: Normal liver size and  configuration. No liver mass. Normal gallbladder with no cholelithiasis. No biliary ductal dilatation. Common bile duct diameter 2 mm. No choledocholithiasis. Pancreas: No pancreatic mass or duct dilation.  No pancreas divisum. Spleen: Normal size. No mass. Adrenals/Urinary Tract: Normal adrenals. No hydronephrosis. Simple subcentimeter exophytic renal cyst in the far upper right kidney. Otherwise normal kidneys, with no suspicious renal mass. Stomach/Bowel: Normal stomach. Normal caliber small bowel. There is mild wall thickening and mucosal hyperenhancement in the terminal ileum (series 1201/ image 96). There is a closely approximated distal small bowel loop in the right abdomen anterior to the terminal ileum, with a suspected short 1 cm in length fistula between the terminal ileum and this adjacent right abdominal small bowel loop (series 14/ image 85). There is a separate approximately 5 cm in length loop of small bowel in the left abdomen demonstrating mild mucosal hyperenhancement (series 1201/ image 89). Otherwise no small bowel wall thickening or wall hyperenhancement. No focal small bowel caliber transition. Status post appendectomy. Normal caliber large bowel. No large bowel wall thickening or wall hyperenhancement. Mild descending and proximal sigmoid colonic diverticulosis. No pericolonic or mesenteric edema. Vascular/Lymphatic: Normal caliber abdominal aorta. Patent portal, splenic, hepatic and renal veins. No pathologically enlarged lymph nodes in the abdomen. Reproductive: Anterior upper uterine body subserosal enhancing 1.8 x 1.6 x 1.9 cm fibroid. Unremarkable ovaries. No adnexal masses. Other: No abdominal ascites or focal fluid collection. Musculoskeletal: No aggressive appearing focal osseous lesions. IMPRESSION: 1. Mild active Crohn terminal ileitis. Suspected short 1 cm in length fistula between the terminal ileum and an adjacent right abdominal small bowel loop anterior to the terminal ileum.  2. Possible mild active Crohn enteritis within a separate short proximal left abdominal small bowel segment. 3. No abscess.  No evidence of bowel obstruction. 4. Solitary subserosal uterine fibroid. Electronically Signed   By: Ilona Sorrel M.D.   On: 10/27/2015 18:33    Geofrey Silliman, DO  [facility] Pager [phone]  If 7PM-7AM, please contact night-coverage [email or web address] Password [email or web address] 11/02/2015, 9:58 AM   LOS: 3 days

## 2015-11-02 NOTE — Progress Notes (Signed)
Progress Note   Subjective  Patient tearful this AM, emotional, having a hard time being in the hospital. She does report abdominal pain is better today, continues to have some loose stools. No blood.   Objective   Vital signs in last 24 hours: Temp:  [97.8 F (36.6 C)-98.5 F (36.9 C)] 98.4 F (36.9 C) (10/16 0400) Pulse Rate:  [54-68] 58 (10/16 0400) Resp:  [18-20] 18 (10/16 0400) BP: (108-152)/(62-93) 152/93 (10/16 0400) SpO2:  [96 %-100 %] 96 % (10/16 0400) Weight:  [197 lb 1.6 oz (89.4 kg)] 197 lb 1.6 oz (89.4 kg) (10/16 0400) Last BM Date: 11/01/15 General:    AA female in NAD Heart:  Regular rate and rhythm;  Lungs: Respirations even and unlabored, lungs CTA bilaterally Abdomen:  Soft, nontender and nondistended.  Extremities:  Without edema. Neurologic:  Alert and oriented,  grossly normal neurologically. Psych:  Cooperative. Emotional affect.  Intake/Output from previous day: 10/15 0701 - 10/16 0700 In: 970 [P.O.:240; Blood:730] Out: -  Intake/Output this shift: No intake/output data recorded.  Lab Results:  Recent Labs  10/31/15 0219 11/01/15 0146 11/02/15 0415  WBC 12.2* 11.7* 9.6  HGB 8.0* 6.8* 9.8*  HCT 25.0* 20.8* 29.8*  PLT 88* 94* 128*   BMET  Recent Labs  10/31/15 0219 11/01/15 0146 11/02/15 0415  NA 139 138 139  K 4.1 3.8 3.9  CL 108 109 108  CO2 22 22 22   GLUCOSE 200* 146* 163*  BUN 20 17 14   CREATININE 1.41* 1.15* 1.21*  CALCIUM 7.7* 7.7* 8.4*   LFT  Recent Labs  10/31/15 0219  PROT 5.2*  ALBUMIN 2.6*  AST 24  ALT 14  ALKPHOS 53  BILITOT 1.2   PT/INR  Recent Labs  11/16/2015 2245  LABPROT 13.0  INR 0.98    Studies/Results: Dg Chest 2 View  Result Date: 11/01/2015 CLINICAL DATA:  Pulmonary edema. EXAM: CHEST  2 VIEW COMPARISON:  10/23/2015 FINDINGS: Two views study shows upper normal heart size. Diffuse interstitial opacity suggests edema. No focal airspace consolidation or substantial pleural effusion.  The visualized bony structures of the thorax are intact. Telemetry leads overlie the chest. IMPRESSION: Slight increase in diffuse interstitial opacities suggesting edema. Electronically Signed   By: Misty Stanley M.D.   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 Improved with IV steroids although continues to have some pain. Also with recent death in the family and with high dose steroids is quite emotional at this time. I reviewed her case with Dr. Carlean Purl as well as workup to date. Biopsies not classic for Crohns (active inflammation only) but imaging strongly suggests it, especially with fisulizing disease. Hepatitis testing negative, quantiferon pending. C diff negative. I have discussed plan moving forward with her and she is in agreement to start Remicade once TB testing negative.  At this time recommend the following: - decrease dose of solumedrol to 60mg  IV once daily - await quantiferon gold, once negative infuse with Remicade 5mg /kg (hopefully later today or tomorrow). Once this is done will transition steroids to prednisone 40mg  po q day with subsequent taper - can continue antibiotics for now - add CRP to today's labs and will trend - given flare of IBD she is higher than average risk for thromboembolism, recommend DVT prophylaxis with lovenox if no contraindications - flu shot and pneumo vacc if not  yet done  Please call with questions   Cathcart Cellar, MD [facility] Pager [phone]

## 2015-11-02 NOTE — Progress Notes (Signed)
CPAP set up at bedside and patient placed on auto titrate via nasal mask with 2 lpm O2 bleed in.  Patient tolerating well at this time. RN aware.

## 2015-11-02 NOTE — Progress Notes (Signed)
Patient is refusing telemetry and is currently upset with her care.  Nancy Acevedo

## 2015-11-02 NOTE — Progress Notes (Signed)
Rt PICC no longer bleeding.  Dry blood under dressing.  Pressure drsg removed.  Pt. Did not want dressing changed at this time.  Will change in am.

## 2015-11-02 NOTE — Plan of Care (Addendum)
Problem: Food- and Nutrition-Related Knowledge Deficit (NB-1.1) Goal: Nutrition education Formal process to instruct or train a patient/client in a skill or to impart knowledge to help patients/clients voluntarily manage or modify food choices and eating behavior to maintain or improve health. Outcome: Completed/Met Date Met: 11/02/15  RD consulted for nutrition education regarding Crohn's disease.  RD provided "Inflammatory Bowel Disease (IBD) and Crohn's Disease Nutrition Therapy" handout from the [employer] of [employer].  Reviewed "tips" with patient including eating smaller meals, drinking plenty of fluids and eating foods that have added probiotics and prebiotics (ie yogurt).  We also discussed "Recommended Foods" and "Not Recommend Foods."   Expect good compliance.  Body mass index is 33.83 kg/m. Pt meets criteria for Obesity Class I based on current BMI.  Current diet order is Full Liquids.  Labs and medications reviewed.   No further nutrition interventions warranted at this time.   RD contact information provided. If additional nutrition issues arise, please re-consult RD.  Arthur Holms, RD, LDN Pager #: [phone] After-Hours Pager #: [phone]

## 2015-11-03 ENCOUNTER — Encounter (HOSPITAL_COMMUNITY): Payer: Self-pay | Admitting: Radiology

## 2015-11-03 ENCOUNTER — Inpatient Hospital Stay (HOSPITAL_COMMUNITY): Payer: BC Managed Care – PPO

## 2015-11-03 DIAGNOSIS — J181 Lobar pneumonia, unspecified organism: Secondary | ICD-10-CM

## 2015-11-03 DIAGNOSIS — J9601 Acute respiratory failure with hypoxia: Secondary | ICD-10-CM

## 2015-11-03 DIAGNOSIS — J189 Pneumonia, unspecified organism: Secondary | ICD-10-CM

## 2015-11-03 LAB — CBC WITH DIFFERENTIAL/PLATELET
Basophils Absolute: 0 10*3/uL (ref 0.0–0.1)
Basophils Relative: 0 %
EOS ABS: 0 10*3/uL (ref 0.0–0.7)
Eosinophils Relative: 0 %
HEMATOCRIT: 29.5 % — AB (ref 36.0–46.0)
HEMOGLOBIN: 9.8 g/dL — AB (ref 12.0–15.0)
LYMPHS ABS: 0.5 10*3/uL — AB (ref 0.7–4.0)
LYMPHS PCT: 5 %
MCH: 28.9 pg (ref 26.0–34.0)
MCHC: 33.2 g/dL (ref 30.0–36.0)
MCV: 87 fL (ref 78.0–100.0)
MONOS PCT: 3 %
Monocytes Absolute: 0.2 10*3/uL (ref 0.1–1.0)
NEUTROS ABS: 8.2 10*3/uL — AB (ref 1.7–7.7)
NEUTROS PCT: 92 %
Platelets: 140 10*3/uL — ABNORMAL LOW (ref 150–400)
RBC: 3.39 MIL/uL — ABNORMAL LOW (ref 3.87–5.11)
RDW: 18 % — ABNORMAL HIGH (ref 11.5–15.5)
WBC: 8.9 10*3/uL (ref 4.0–10.5)

## 2015-11-03 LAB — URINALYSIS, ROUTINE W REFLEX MICROSCOPIC
BILIRUBIN URINE: NEGATIVE
GLUCOSE, UA: NEGATIVE mg/dL
HGB URINE DIPSTICK: NEGATIVE
Ketones, ur: NEGATIVE mg/dL
Leukocytes, UA: NEGATIVE
Nitrite: NEGATIVE
PROTEIN: 30 mg/dL — AB
SPECIFIC GRAVITY, URINE: 1.014 (ref 1.005–1.030)
pH: 5.5 (ref 5.0–8.0)

## 2015-11-03 LAB — BLOOD GAS, ARTERIAL
Acid-base deficit: 0.8 mmol/L (ref 0.0–2.0)
Bicarbonate: 22.5 mmol/L (ref 20.0–28.0)
O2 Content: 2 L/min
O2 Saturation: 98.5 %
PATIENT TEMPERATURE: 102.2
PO2 ART: 127 mmHg — AB (ref 83.0–108.0)
pCO2 arterial: 34.9 mmHg (ref 32.0–48.0)
pH, Arterial: 7.435 (ref 7.350–7.450)

## 2015-11-03 LAB — URINE MICROSCOPIC-ADD ON: RBC / HPF: NONE SEEN RBC/hpf (ref 0–5)

## 2015-11-03 LAB — GLUCOSE, CAPILLARY
GLUCOSE-CAPILLARY: 116 mg/dL — AB (ref 65–99)
GLUCOSE-CAPILLARY: 123 mg/dL — AB (ref 65–99)
Glucose-Capillary: 134 mg/dL — ABNORMAL HIGH (ref 65–99)
Glucose-Capillary: 165 mg/dL — ABNORMAL HIGH (ref 65–99)
Glucose-Capillary: 183 mg/dL — ABNORMAL HIGH (ref 65–99)

## 2015-11-03 LAB — COMPREHENSIVE METABOLIC PANEL
ALK PHOS: 161 U/L — AB (ref 38–126)
ALT: 21 U/L (ref 14–54)
ANION GAP: 8 (ref 5–15)
AST: 52 U/L — ABNORMAL HIGH (ref 15–41)
Albumin: 2.5 g/dL — ABNORMAL LOW (ref 3.5–5.0)
BILIRUBIN TOTAL: 0.8 mg/dL (ref 0.3–1.2)
BUN: 11 mg/dL (ref 6–20)
CALCIUM: 8 mg/dL — AB (ref 8.9–10.3)
CO2: 24 mmol/L (ref 22–32)
CREATININE: 1.33 mg/dL — AB (ref 0.44–1.00)
Chloride: 104 mmol/L (ref 101–111)
GFR calc non Af Amer: 45 mL/min — ABNORMAL LOW (ref >60)
GFR, EST AFRICAN AMERICAN: 52 mL/min — AB (ref >60)
Glucose, Bld: 121 mg/dL — ABNORMAL HIGH (ref 65–99)
Potassium: 3.4 mmol/L — ABNORMAL LOW (ref 3.5–5.1)
Sodium: 136 mmol/L (ref 135–145)
TOTAL PROTEIN: 5.3 g/dL — AB (ref 6.5–8.1)

## 2015-11-03 LAB — D-DIMER, QUANTITATIVE (NOT AT ARMC): D DIMER QUANT: 9.44 ug{FEU}/mL — AB (ref 0.00–0.50)

## 2015-11-03 LAB — LACTIC ACID, PLASMA: Lactic Acid, Venous: 1.8 mmol/L (ref 0.5–1.9)

## 2015-11-03 LAB — MRSA PCR SCREENING: MRSA BY PCR: NEGATIVE

## 2015-11-03 LAB — BRAIN NATRIURETIC PEPTIDE: B NATRIURETIC PEPTIDE 5: 143.2 pg/mL — AB (ref 0.0–100.0)

## 2015-11-03 LAB — PROCALCITONIN: PROCALCITONIN: 2.8 ng/mL

## 2015-11-03 MED ORDER — POTASSIUM CHLORIDE CRYS ER 20 MEQ PO TBCR
20.0000 meq | EXTENDED_RELEASE_TABLET | Freq: Once | ORAL | Status: AC
  Administered 2015-11-03: 20 meq via ORAL
  Filled 2015-11-03: qty 1

## 2015-11-03 MED ORDER — IOPAMIDOL (ISOVUE-370) INJECTION 76%
INTRAVENOUS | Status: AC
  Administered 2015-11-03: 50 mL
  Filled 2015-11-03: qty 100

## 2015-11-03 MED ORDER — FUROSEMIDE 10 MG/ML IJ SOLN
40.0000 mg | Freq: Once | INTRAMUSCULAR | Status: AC
  Administered 2015-11-03: 40 mg via INTRAVENOUS
  Filled 2015-11-03: qty 4

## 2015-11-03 MED ORDER — METHYLPREDNISOLONE SODIUM SUCC 40 MG IJ SOLR
40.0000 mg | INTRAMUSCULAR | Status: DC
  Administered 2015-11-04 – 2015-11-06 (×3): 40 mg via INTRAVENOUS
  Filled 2015-11-03 (×3): qty 1

## 2015-11-03 MED ORDER — PIPERACILLIN-TAZOBACTAM 3.375 G IVPB
3.3750 g | Freq: Three times a day (TID) | INTRAVENOUS | Status: DC
  Administered 2015-11-03 – 2015-11-08 (×16): 3.375 g via INTRAVENOUS
  Filled 2015-11-03 (×22): qty 50

## 2015-11-03 MED ORDER — TUBERCULIN PPD 5 UNIT/0.1ML ID SOLN
5.0000 [IU] | Freq: Once | INTRADERMAL | Status: AC
  Administered 2015-11-03: 5 [IU] via INTRADERMAL
  Filled 2015-11-03: qty 0.1

## 2015-11-03 MED ORDER — VANCOMYCIN HCL IN DEXTROSE 750-5 MG/150ML-% IV SOLN
750.0000 mg | Freq: Two times a day (BID) | INTRAVENOUS | Status: DC
  Administered 2015-11-03: 750 mg via INTRAVENOUS
  Filled 2015-11-03 (×2): qty 150

## 2015-11-03 MED ORDER — ACETAMINOPHEN 500 MG PO TABS
500.0000 mg | ORAL_TABLET | Freq: Once | ORAL | Status: AC
  Administered 2015-11-03: 500 mg via ORAL
  Filled 2015-11-03: qty 1

## 2015-11-03 NOTE — Progress Notes (Addendum)
Patient assisst. To [facility]. And patient c/o of inc. Weakness and just flopped into the bed V.S. Done.t-102.3 oral ax. 102.3 B.P.149/82 P-93 R.-40 lungs clr. Using abdo. Muscles. R.N. Aware. K.Kirby N.P. Aware of today's event and will come see patient. Rapid response team call and made aware. See Orders written by K.Kirby N.P.

## 2015-11-03 NOTE — Progress Notes (Signed)
Patient cont. To be confused to place only and is seeing police here and thinks they are keeping her here at home. Easy reorin. Accu ck done it was 64 Ensure I can given.patient then got nausea  V.S.160/86 p-110 r-20 t-97.8 r.a. 95% r.n. Aware will reck. accu ck. Shortly.zofran 4 mg p.o. given

## 2015-11-03 NOTE — Progress Notes (Signed)
accu ck 98

## 2015-11-03 NOTE — Progress Notes (Addendum)
LPN, Melanie, paged this NP because pt was feeling weak, SOB, and has a fever of 102.3. NP to bedside.  S: Endorses weakness. Tried to get up and felt dizzy and weak and sat back down. Feels SOB for past few minutes. Denies CP. No increased abd pain over earlier today or yesterday. + BMs. Non productive cough today. Per nurse, pt's O2 sat dropped to high 80s when OOB. Cpap applied by RT. Per nurse, pt has had some confusion and "paranoia" today which was thought to maybe be pain meds and/or solumedrol. Pt refused Solumedrol tonight.  O: VSS except for temp and short episode of hypoxia. Acutely ill appearing AAF in mild distress. Alert, answers questions appropriately. Skin is hot and dry. Card: NSR. No LE edema noted. Resp: increased rate, mild use of accessory muscles, but improved while NP at bedside. Lungs CTA. Abd: hypoactive bowel sounds. Soft. Generalized tenderness without rebound or guarding. No rigidity.  Neuro: no focal neuro deficit noted.  A/P: 1. Acute respiratory distress-ABG looks fine. CXR pending but self read shows nothing acute. Cpap during night. Check DDimer given tachypnea and short episode of hypoxia.  2. Confusion-none noted at present. Meds? No hypercarbia on ABG.  3. Crohn's flare-no increased tenderness. Continue abx.  4. Fever-? Etiology. Await test results. Check blood cultures, CBC with diff, CMP, LA and UA. Tylenol.  No IVF for now as pt had some overload yesterday.  Will follow closely. Low threshold to TF to SDU, ? Bipap.  KJKG, NP [facility] Update: Temp still up. Tylenol 500mg  additional. CXR with opacities increased over last one, ? Edema and congestion, "can not exclude pneumonia". WBCC normal. LA normal. Ddimer over 9. Getting CTA chest to r/o PE. O2 sat 100% on 2L now but still tachypneic. I think it is prudent to TF pt to SDU. Plan discussed with RN.   ? add abx for PNA.  KJKG, NP [facility]  Update: Pt now on 4E in SDU. Pt states she feels a lot better. Breathing is  better, easier. Temp down. On 50% ventimask satting normally and without tachypnea now. CTA chest results are pending. Tried to call daughter with pt's permission, but no answer. Will try again later. KJKG, NP [facility] Update: CTA without PE but + multifocal PNA, which we will tx as HCAP given admission date and no PNA on admission. NP spoke with pharmD, Jeneen Rinks, who recommended Vanc/Zosyn and can d/c Cipro and Flagyl.  KJKG, NP  [facility]

## 2015-11-03 NOTE — Progress Notes (Signed)
PROGRESS NOTE  Starlight Harrison [id number] DOB: [birth year] DOA: 11/06/2015 PCP: Binnie Rail, MD  Brief History: 55 y.o.femalewith medical history significant for hypertension, chronic kidney disease stage II, OSA, GERD, and recent diagnosis of Crohn's ileocolitis managed with prednisone who presentedto the [place] emergency department with 2-3 days of fevers, nausea, vomiting, and nonbloody diarrhea. Clinical picture was unclear until recently when when patient had MR enterography on 10/27/2015 showing likely Crohn's with entero-enterofistula, patient had been on prednisone40 mg daily and isin process of being started onRemicade. Pt has had abd pain x 6 months resulting in colonoscopy on 07/31/15 (Dr. Ardis Hughs). This showed a largeulcer ileocecal valve, abnormal mucosa of the TI region, seven punctate, deep ulcers in the rt colon all about 6 mm across; Path-active inflammation with ulceration, no dysplasia. Abdominal pain worsening in past 2 weeks to point patient "could hardly walk". Along with this the patient has been having at least 10 loose stools per day, but denies seeing any bright red blood or melena. The patient has had some fevers and nausea as well as vomiting within the past few days. On the night of 11/02/2015, the patient developed respiratory distress. CT and her grandmother chest was obtained and was negative for pulmonary embolus but showed bilateral diffuse groundglass opacities. Chest x-ray showed increasing right lower lobe nodular opacity. The patient was started on intravenous Zosyn and moved to the stepdown unit.  Patient has also had sharp substernal CP for past several months. She claims increase dyspnea on exertion walking up her stairs at home along with exertional chest pain for 1-2 months. She has had an extensive cardiac work upin January 2017 when she had exertional chest pain associated with syncope. She underwent heart  catheterization on 02/11/2015 which revealed normal coronaries with EF 55-60%. Typical cardiac rhythm at the time of discharge EF 60-65% with no wall motion and mellitus.  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 -Continue IV Cipro and Flagyl -C diff negative -initially not on pharmacologic DVT prophylaxis due to thrombocytopenia  Acute respiratory failure with hypoxia -secondary to pulmonary edema superimposed upon PNA -continue zosyn -d/c vancomycin -Lasix 40 mg IV x 1 -d/c IVF -check procalcitonin -wean oxygen for saturation > 92 %  Pulmonary Edema -likely due to IVF --Echocardiogram--EF 55-65%, no WMA, trivial MR/TR -Lasix 40 mg IV x 1 -repeat CXR in am  RLL infiltrate/Ground glass opacities -?HCAP vs pulmonary edema -lasix 40 mg IV x 1 and repeat CXR in am -check procalcitonin -continue empiric zosyn -follow blood culture  Sepsis -present at the time of admission -secondary to intraabdominal process -continue IVF -continue IV cipro/flagyl -sepsis physiology improved  Chest pain -some typical and atypical components -Completely reproducible on palpation of sternum -02/11/15 cardiac cath--normal coronaries -personally reviewed EKG--sinus, early repol, nonspecific T-wave changes -CXR-increase interstitial markings--not fluid overloaded clinically -Echocardiogram--EF 55-65%, no WMA, trivial MR/TR  Indeterminant QuanteriFERON -checking for latent TB for remicade -NO clinical suspicion for active pulm TB  Elevated troponin -demand ischemia in setting of sepsis  CKD  2 -Baseline creatinine 1-1.2  Hypertension -Continue amlodipine and metoprolol tartrate -pt endorsed poor compliance with meds at home missing 2-3  days per week  Diabetes mellitus type 2 -10/02/2015 hemoglobin A1c 6.6 -Continue NovoLog sliding scale -pt not on any agents at home -elevated CBGs due to steroids  GERD -10/22/14--EGD--The esophageal mucosa was abnormal appearing throughout but mainly in the proximal 1/2 of the esophagus. There were numerous linear and irregularly patterned furrows throughout.  Thrombocytopenia -likely due to sepsis, but pt with hx of B12 deficiency -not completely compliant with B12 supplements at home -check B12--707 -am CBC  Hypokalemia/hypomagnesemia -repleted -Check magnesium--1.6-->2.2  Anemia -transfused 2 units PRBCs 11/01/15   Disposition Plan: Not stable for d/c Family Communication: Family at bedside updated 11/03/15--Total time spent 64minutes. Greater than 50% spent face to face counseling and coordinating care.   Consultants:[facility]  Code Status: FULL  DVT Prophylaxis: SCDs   Procedures: As Listed in Progress Note Above  Antibiotics: cipro 10/13>>>10/16 Flagyl 10/13>>>10/16 Vanco 11/03/15 x 1 Zosyn 11/02/15>>>    Subjective: Patient developed better than yesterday. Still has some intermittent chest pain. Denies any nausea, vomiting, dysuria, hematuria. Having loose stools. Denies any headache, neck pain. States that her chest pain is centrally located in the sternum that is reproducible and sharp in nature.  Objective: Vitals:   11/03/15 0527 11/03/15 0752 11/03/15 0925 11/03/15 1228  BP: (!) 151/90 137/80 (!) 160/101 138/90  Pulse:  90 88 (!) 104  Resp: (!) 28 (!) 31  (!) 26  Temp: (!) 100.5 F (38.1 C) 99.2 F (37.3 C)  (!) 100.8 F (38.2 C)  TempSrc: Oral Oral  Axillary  SpO2: 98% 98%  100%  Weight: 90.1 kg (198 lb 9.6 oz)     Height: 5\' 4"  (1.626 m)       Intake/Output  Summary (Last 24 hours) at 11/03/15 1349 Last data filed at 11/03/15 1027  Gross per 24 hour  Intake              800 ml  Output             1550 ml  Net             -750 ml   Weight change: 0.68 kg (1 lb 8 oz) Exam:   General:  Pt is alert, follows commands appropriately, not in acute distress  HEENT: No icterus, No thrush, No neck mass, [place]/AT  Cardiovascular: RRR, S1/S2, no rubs, no gallops  Respiratory: Bibasilar crackles, right greater than left. No wheezing. Abdomen.  Abdomen: Soft/+BS, diffusely tender without any rebound, non distended, no guarding  Extremities: trace LE edema, No lymphangitis, No petechiae, No rashes, no synovitis   Data Reviewed: I have personally reviewed following labs and imaging studies Basic Metabolic Panel:  Recent Labs Lab 10/31/2015 1510 10/29/2015 2245 10/31/15 0219 11/01/15 0146 11/02/15 0415 11/03/15 0235  NA 137  --  139 138 139 136  K 3.1*  --  4.1 3.8 3.9 3.4*  CL 102  --  108 109 108 104  CO2 23  --  22 22 22 24   GLUCOSE 124*  --  200* 146* 163* 121*  BUN 27*  --  20 17 14 11   CREATININE 1.36*  --  1.41* 1.15* 1.21* 1.33*  CALCIUM 8.4*  --  7.7* 7.7* 8.4* 8.0*  MG  --  1.4*  --  1.6* 2.4  --    Liver Function Tests:  Recent Labs Lab 10/27/2015 1510 10/31/15 0219 11/03/15 0235  AST 27 24 52*  ALT 17 14 21   ALKPHOS 62 53 161*  BILITOT 1.6* 1.2 0.8  PROT 6.5 5.2* 5.3*  ALBUMIN 3.3* 2.6* 2.5*    Recent Labs Lab 10/22/2015 1510  LIPASE 17   No results for input(s): AMMONIA in the last 168 hours. Coagulation Profile:  Recent Labs Lab 10/22/2015 2245  INR 0.98   CBC:  Recent Labs Lab 10/23/2015 1510 10/31/15 0219 11/01/15 0146 11/02/15 0415 11/03/15 0235  WBC 14.7* 12.2* 11.7* 9.6 8.9  NEUTROABS 12.8* 11.4*  --   --  8.2*  HGB 9.7* 8.0* 6.8* 9.8* 9.8*  HCT 29.8* 25.0* 20.8* 29.8* 29.5*  MCV 91.4 88.7 89.3 86.4 87.0  PLT 111* 88* 94* 128* 140*   Cardiac Enzymes:  Recent Labs Lab 10/31/15 1516  10/31/15 2039 11/01/15 0146  TROPONINI 0.09* 0.11* 0.09*   BNP: Invalid input(s): POCBNP CBG:  Recent Labs Lab 11/02/15 2109 11/02/15 2205 11/03/15 0141 11/03/15 0749 11/03/15 1228  GLUCAP 64* 98 134* 116* 123*   HbA1C: No results for input(s): HGBA1C in the last 72 hours. Urine analysis:    Component Value Date/Time   COLORURINE YELLOW 11/03/2015 0457   APPEARANCEUR CLOUDY (A) 11/03/2015 0457   LABSPEC 1.014 11/03/2015 0457   PHURINE 5.5 11/03/2015 0457   GLUCOSEU NEGATIVE 11/03/2015 0457   GLUCOSEU NEGATIVE 04/15/2015 1209   HGBUR NEGATIVE 11/03/2015 0457   BILIRUBINUR NEGATIVE 11/03/2015 0457   KETONESUR NEGATIVE 11/03/2015 0457   PROTEINUR 30 (A) 11/03/2015 0457   UROBILINOGEN 0.2 04/15/2015 1209   NITRITE NEGATIVE 11/03/2015 0457   LEUKOCYTESUR NEGATIVE 11/03/2015 0457   Sepsis Labs: @LABRCNTIP (procalcitonin:4,lacticidven:4) ) Recent Results (from the past 240 hour(s))  C difficile quick screen w PCR reflex     Status: None   Collection Time: 10/31/15 10:00 AM  Result Value Ref Range Status   C Diff antigen NEGATIVE NEGATIVE Final   C Diff toxin NEGATIVE NEGATIVE Final   C Diff interpretation No C. difficile detected.  Final  MRSA PCR Screening     Status: None   Collection Time: 11/03/15  5:09 AM  Result Value Ref Range Status   MRSA by PCR NEGATIVE NEGATIVE Final    Comment:        The GeneXpert MRSA Assay (FDA approved for NASAL specimens only), is one component of a comprehensive MRSA colonization surveillance program. It is not intended to diagnose MRSA infection nor to guide or monitor treatment for MRSA infections.      Scheduled Meds: . amLODipine  10 mg Oral Daily  . enoxaparin (LOVENOX) injection  40 mg Subcutaneous Q24H  . famotidine  20 mg Oral BID  . insulin aspart  0-5 Units Subcutaneous QHS  . insulin aspart  0-9 Units Subcutaneous TID WC  . [START ON 11/04/2015] methylPREDNISolone (SOLU-MEDROL) injection  40 mg Intravenous  Q24H  . metoprolol tartrate  50 mg Oral BID  . piperacillin-tazobactam (ZOSYN)  IV  3.375 g Intravenous Q8H  . sodium chloride flush  10-40 mL Intracatheter Q12H  . sodium chloride flush  3 mL Intravenous Q12H  . tuberculin  5 Units Intradermal Once  . vancomycin  750 mg Intravenous Q12H   Continuous Infusions:   Procedures/Studies: Dg Chest 2 View  Result Date: 11/01/2015 CLINICAL DATA:  Pulmonary edema. EXAM: CHEST  2 VIEW COMPARISON:  11/12/2015 FINDINGS: Two views study shows upper normal heart size. Diffuse interstitial opacity suggests edema. No focal airspace consolidation or substantial pleural effusion. The visualized bony structures of the thorax are intact. Telemetry leads overlie the chest. IMPRESSION: Slight increase in diffuse interstitial opacities suggesting edema. Electronically Signed  By: Misty Stanley M.D.   On: 11/01/2015 08:23   Dg Chest 2 View  Result Date: 11/16/2015 CLINICAL DATA:  Chest pain and coughing for 2 weeks. EXAM: CHEST  2 VIEW COMPARISON:  02/09/2015 FINDINGS: AP and lateral views of the chest show low volumes. The cardio pericardial silhouette is enlarged. There is pulmonary vascular congestion without overt pulmonary edema. The visualized bony structures of the thorax are intact. Telemetry leads overlie the chest. IMPRESSION: Cardiomegaly with pulmonary vascular congestion. Electronically Signed   By: Misty Stanley M.D.   On: 10/18/2015 17:37   Ct Angio Chest Pe W Or Wo Contrast  Result Date: 11/03/2015 CLINICAL DATA:  55 year old female with shortness of breath and tachycardia. EXAM: CT ANGIOGRAPHY CHEST WITH CONTRAST TECHNIQUE: Multidetector CT imaging of the chest was performed using the standard protocol during bolus administration of intravenous contrast. Multiplanar CT image reconstructions and MIPs were obtained to evaluate the vascular anatomy. CONTRAST:  50 cc Isovue 370 COMPARISON:  Chest radiograph dated 11/03/2015. FINDINGS: Cardiovascular:  The thoracic aorta appears unremarkable. The origins of the great vessels of the aortic arch appear patent. There is no CT evidence of pulmonary embolism. Mediastinum/Nodes: Right hilar adenopathy. Top-normal left hilar lymph nodes. No significant mediastinal adenopathy. The esophagus is grossly unremarkable. No thyroid nodules identified. Lungs/Pleura: There is diffuse ground-glass airspace opacity throughout the lungs. Confluent nodular consolidative changes bilaterally most compatible with multifocal pneumonia. Correlation with clinical exam and follow-up to resolution recommended. Small left pleural effusion. There is no pneumothorax. The central airways are patent. Upper Abdomen: Fatty infiltration of the liver. There is thickened appearance of the visualized portion of the splenic flexure compatible with colitis. Musculoskeletal: No chest wall abnormality. No acute or significant osseous findings. Review of the MIP images confirms the above findings. IMPRESSION: No CT evidence of pulmonary embolism. Extensive bilateral ground-glass density and confluent nodular consolidative changes most compatible with multifocal pneumonia. Clinical correlation and follow-up recommended. Inflammatory changes of the splenic flexure compatible with colitis. Correlation with clinical exam and stool cultures recommended. Electronically Signed   By: Anner Crete M.D.   On: 11/03/2015 05:13   Ct Abdomen Pelvis W Contrast  Result Date: 11/12/2015 CLINICAL DATA:  Crohn's disease. Diffuse abdominal pain. Nausea, vomiting and diarrhea. EXAM: CT ABDOMEN AND PELVIS WITH CONTRAST TECHNIQUE: Multidetector CT imaging of the abdomen and pelvis was performed using the standard protocol following bolus administration of intravenous contrast. CONTRAST:  140mL ISOVUE-300 IOPAMIDOL (ISOVUE-300) INJECTION 61% COMPARISON:  MR enterography 10/27/2015 FINDINGS: Lower chest: No pulmonary nodules. No visible pleural or pericardial effusion.  Hepatobiliary: Normal hepatic size and contours without focal liver lesion. No perihepatic ascites. No intra- or extrahepatic biliary dilatation. Normal gallbladder. Pancreas: Normal pancreatic contours and enhancement. No peripancreatic fluid collection or pancreatic ductal dilatation. Spleen: Normal. Adrenals/Urinary Tract: Normal adrenal glands. No hydronephrosis or solid renal mass. Stomach/Bowel: There is long segment bowel wall thickening and surrounding inflammatory stranding extending from the terminal ileum to the junction of the descending and sigmoid colon. There is again seen a suspected fistula between the terminal ileum and an adjacent loop of right lower quadrant bowel (sagittal image 43, coronal image 51). The appendix is not visualized. No abscess or focal fluid collection. No dilated small bowel. No evidence of focal small bowel inflammation. Vascular/Lymphatic: Normal course and caliber of the major abdominal vessels. No abdominal or pelvic adenopathy. Reproductive: Hyperenhancing focus at the uterine fundus is likely a small fibroid. Ovaries are normal. Musculoskeletal: Multilevel osteophytosis and facet arthrosis. No  bony spinal canal stenosis. No lytic or blastic lesions. Normal visualized extrathoracic and extraperitoneal soft tissues. Other: No contributory non-categorized findings. IMPRESSION: 1. Long segment bowel inflammation extending from the terminal ileum to the junction of the descending and sigmoid colon, consistent with active Crohn's disease. No abscess or fluid collection. 2. 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 Electronically Signed   By: Ulyses Jarred M.D.   On: 11/05/2015 17:49   Mr Consuela Mimes W/o W/cm  Result Date: 10/27/2015 CLINICAL DATA:  55 year old female with Crohn disease and abdominal pain. History of appendectomy. EXAM: MR ABDOMEN AND PELVIS WITHOUT AND WITH CONTRAST (MR  ENTEROGRAPHY) TECHNIQUE: Multiplanar, multisequence MRI of the abdomen and pelvis was performed both before and during bolus administration of intravenous contrast. Negative oral contrast VoLumen was given. CONTRAST:  32mL MULTIHANCE GADOBENATE DIMEGLUMINE 529 MG/ML IV SOLN COMPARISON:  09/29/2015 CT abdomen/pelvis. FINDINGS: COMBINED FINDINGS FOR BOTH MR ABDOMEN AND PELVIS Many of the sequences are motion degraded. Lower chest: Clear lung bases. Stable mild elevation of the left hemidiaphragm. Hepatobiliary: Normal liver size and configuration. No liver mass. Normal gallbladder with no cholelithiasis. No biliary ductal dilatation. Common bile duct diameter 2 mm. No choledocholithiasis. Pancreas: No pancreatic mass or duct dilation.  No pancreas divisum. Spleen: Normal size. No mass. Adrenals/Urinary Tract: Normal adrenals. No hydronephrosis. Simple subcentimeter exophytic renal cyst in the far upper right kidney. Otherwise normal kidneys, with no suspicious renal mass. Stomach/Bowel: Normal stomach. Normal caliber small bowel. There is mild wall thickening and mucosal hyperenhancement in the terminal ileum (series 1201/ image 96). There is a closely approximated distal small bowel loop in the right abdomen anterior to the terminal ileum, with a suspected short 1 cm in length fistula between the terminal ileum and this adjacent right abdominal small bowel loop (series 14/ image 85). There is a separate approximately 5 cm in length loop of small bowel in the left abdomen demonstrating mild mucosal hyperenhancement (series 1201/ image 89). Otherwise no small bowel wall thickening or wall hyperenhancement. No focal small bowel caliber transition. Status post appendectomy. Normal caliber large bowel. No large bowel wall thickening or wall hyperenhancement. Mild descending and proximal sigmoid colonic diverticulosis. No pericolonic or mesenteric edema. Vascular/Lymphatic: Normal caliber abdominal aorta. Patent portal,  splenic, hepatic and renal veins. No pathologically enlarged lymph nodes in the abdomen. Reproductive: Anterior upper uterine body subserosal enhancing 1.8 x 1.6 x 1.9 cm fibroid. Unremarkable ovaries. No adnexal masses. Other: No abdominal ascites or focal fluid collection. Musculoskeletal: No aggressive appearing focal osseous lesions. IMPRESSION: 1. Mild active Crohn terminal ileitis. Suspected short 1 cm in length fistula between the terminal ileum and an adjacent right abdominal small bowel loop anterior to the terminal ileum. 2. Possible mild active Crohn enteritis within a separate short proximal left abdominal small bowel segment. 3. No abscess.  No evidence of bowel obstruction. 4. Solitary subserosal uterine fibroid. Electronically Signed   By: Ilona Sorrel M.D.   On: 10/27/2015 18:33   Mr [place] Pelvis W/o W/cm  Result Date: 10/27/2015 CLINICAL DATA:  55 year old female with Crohn disease and abdominal pain. History of appendectomy. EXAM: MR ABDOMEN AND PELVIS WITHOUT AND WITH CONTRAST (MR ENTEROGRAPHY) TECHNIQUE: Multiplanar, multisequence MRI of the abdomen and pelvis was performed both before and during bolus administration of intravenous contrast. Negative oral contrast VoLumen was given. CONTRAST:  52mL MULTIHANCE GADOBENATE DIMEGLUMINE 529 MG/ML IV SOLN COMPARISON:  09/29/2015 CT abdomen/pelvis. FINDINGS: COMBINED FINDINGS FOR  BOTH MR ABDOMEN AND PELVIS Many of the sequences are motion degraded. Lower chest: Clear lung bases. Stable mild elevation of the left hemidiaphragm. Hepatobiliary: Normal liver size and configuration. No liver mass. Normal gallbladder with no cholelithiasis. No biliary ductal dilatation. Common bile duct diameter 2 mm. No choledocholithiasis. Pancreas: No pancreatic mass or duct dilation.  No pancreas divisum. Spleen: Normal size. No mass. Adrenals/Urinary Tract: Normal adrenals. No hydronephrosis. Simple subcentimeter exophytic renal cyst in the far upper right kidney.  Otherwise normal kidneys, with no suspicious renal mass. Stomach/Bowel: Normal stomach. Normal caliber small bowel. There is mild wall thickening and mucosal hyperenhancement in the terminal ileum (series 1201/ image 96). There is a closely approximated distal small bowel loop in the right abdomen anterior to the terminal ileum, with a suspected short 1 cm in length fistula between the terminal ileum and this adjacent right abdominal small bowel loop (series 14/ image 85). There is a separate approximately 5 cm in length loop of small bowel in the left abdomen demonstrating mild mucosal hyperenhancement (series 1201/ image 89). Otherwise no small bowel wall thickening or wall hyperenhancement. No focal small bowel caliber transition. Status post appendectomy. Normal caliber large bowel. No large bowel wall thickening or wall hyperenhancement. Mild descending and proximal sigmoid colonic diverticulosis. No pericolonic or mesenteric edema. Vascular/Lymphatic: Normal caliber abdominal aorta. Patent portal, splenic, hepatic and renal veins. No pathologically enlarged lymph nodes in the abdomen. Reproductive: Anterior upper uterine body subserosal enhancing 1.8 x 1.6 x 1.9 cm fibroid. Unremarkable ovaries. No adnexal masses. Other: No abdominal ascites or focal fluid collection. Musculoskeletal: No aggressive appearing focal osseous lesions. IMPRESSION: 1. Mild active Crohn terminal ileitis. Suspected short 1 cm in length fistula between the terminal ileum and an adjacent right abdominal small bowel loop anterior to the terminal ileum. 2. Possible mild active Crohn enteritis within a separate short proximal left abdominal small bowel segment. 3. No abscess.  No evidence of bowel obstruction. 4. Solitary subserosal uterine fibroid. Electronically Signed   By: Ilona Sorrel M.D.   On: 10/27/2015 18:33   Dg Chest Port 1 View  Result Date: 11/03/2015 CLINICAL DATA:  55 year old female with shortness of breath and  tachypnea EXAM: PORTABLE CHEST 1 VIEW COMPARISON:  Chest radiograph dated 11/01/2015 FINDINGS: There has been interval placement of a right-sided PICC with tip over central SVC. Bilateral interstitial nodular opacities primarily involving the right mid to lower lung field, new or progressed compared to the prior study and likely represent congestive changes or edema. Pneumonia is not excluded. Clinical correlation is recommended. There is no pleural effusion or pneumothorax. Top-normal cardiac silhouette. No acute osseous pathology. IMPRESSION: Right-sided PICC with tip over central SVC.  No pneumothorax. Interval increase in pulmonary nodular densities primarily involving the right lung likely related to interval worsening of the congestion or edema. Pneumonia is not excluded. Clinical correlation and follow-up recommended. Electronically Signed   By: Anner Crete M.D.   On: 11/03/2015 02:51    Mliss Wedin, DO  [facility] Pager [phone]  If 7PM-7AM, please contact night-coverage [email or web address] Password [email or web address] 11/03/2015, 1:49 PM   LOS: 4 days

## 2015-11-03 NOTE — Progress Notes (Signed)
Spoke w/ K.Kirby N.P. And she tried to get hold of Daughter. And daughter did   Not answer. She is going to try again  Now.

## 2015-11-03 NOTE — Progress Notes (Signed)
Progress Note   Subjective  Patient had some respiratory distress overnight with fever to 103. CTA obtained showing multifocal pneumonia. She reports her abdomen is not bothering her so much right now and is otherwise doing pretty well. Quantiferon came back indeterminate.   Objective   Vital signs in last 24 hours: Temp:  [97.8 F (36.6 C)-103.1 F (39.5 C)] 99.2 F (37.3 C) (10/17 0752) Pulse Rate:  [72-110] 88 (10/17 0925) Resp:  [18-40] 31 (10/17 0752) BP: (135-181)/(79-101) 160/101 (10/17 0925) SpO2:  [85 %-100 %] 98 % (10/17 0752) FiO2 (%):  [50 %] 50 % (10/17 0527) Weight:  [198 lb 9.6 oz (90.1 kg)] 198 lb 9.6 oz (90.1 kg) (10/17 0527) Last BM Date: 11/03/15 General:    AA female with tachypnea, wearing nonrebreather Heart:  Regular rate and rhythm; no murmurs Lungs: Respirations even, tachypnic, some coarse BS anteriorly bilaterally Abdomen:  Soft, nontender and nondistended.  Extremities:  Without edema. Neurologic:  Alert and oriented,  grossly normal neurologically. Psych:  Cooperative. Normal mood and affect.  Intake/Output from previous day: 10/16 0701 - 10/17 0700 In: 1280 [P.O.:1080; IV Piggyback:200] Out: 375 [Urine:375] Intake/Output this shift: Total I/O In: -  Out: 1175 [Urine:1175]  Lab Results:  Recent Labs  11/01/15 0146 11/02/15 0415 11/03/15 0235  WBC 11.7* 9.6 8.9  HGB 6.8* 9.8* 9.8*  HCT 20.8* 29.8* 29.5*  PLT 94* 128* 140*   BMET  Recent Labs  11/01/15 0146 11/02/15 0415 11/03/15 0235  NA 138 139 136  K 3.8 3.9 3.4*  CL 109 108 104  CO2 22 22 24   GLUCOSE 146* 163* 121*  BUN 17 14 11   CREATININE 1.15* 1.21* 1.33*  CALCIUM 7.7* 8.4* 8.0*   LFT  Recent Labs  11/03/15 0235  PROT 5.3*  ALBUMIN 2.5*  AST 52*  ALT 21  ALKPHOS 161*  BILITOT 0.8   PT/INR No results for input(s): LABPROT, INR in the last 72 hours.  Studies/Results: Ct Angio Chest Pe W Or Wo Contrast  Result Date: 11/03/2015 CLINICAL DATA:   55 year old female with shortness of breath and tachycardia. EXAM: CT ANGIOGRAPHY CHEST WITH CONTRAST TECHNIQUE: Multidetector CT imaging of the chest was performed using the standard protocol during bolus administration of intravenous contrast. Multiplanar CT image reconstructions and MIPs were obtained to evaluate the vascular anatomy. CONTRAST:  50 cc Isovue 370 COMPARISON:  Chest radiograph dated 11/03/2015. FINDINGS: Cardiovascular: The thoracic aorta appears unremarkable. The origins of the great vessels of the aortic arch appear patent. There is no CT evidence of pulmonary embolism. Mediastinum/Nodes: Right hilar adenopathy. Top-normal left hilar lymph nodes. No significant mediastinal adenopathy. The esophagus is grossly unremarkable. No thyroid nodules identified. Lungs/Pleura: There is diffuse ground-glass airspace opacity throughout the lungs. Confluent nodular consolidative changes bilaterally most compatible with multifocal pneumonia. Correlation with clinical exam and follow-up to resolution recommended. Small left pleural effusion. There is no pneumothorax. The central airways are patent. Upper Abdomen: Fatty infiltration of the liver. There is thickened appearance of the visualized portion of the splenic flexure compatible with colitis. Musculoskeletal: No chest wall abnormality. No acute or significant osseous findings. Review of the MIP images confirms the above findings. IMPRESSION: No CT evidence of pulmonary embolism. Extensive bilateral ground-glass density and confluent nodular consolidative changes most compatible with multifocal pneumonia. Clinical correlation and follow-up recommended. Inflammatory changes of the splenic flexure compatible with colitis. Correlation with clinical exam and stool cultures recommended. Electronically Signed   By: Laren Everts.D.  On: 11/03/2015 05:13   Dg Chest Port 1 View  Result Date: 11/03/2015 CLINICAL DATA:  55 year old female with shortness of  breath and tachypnea EXAM: PORTABLE CHEST 1 VIEW COMPARISON:  Chest radiograph dated 11/01/2015 FINDINGS: There has been interval placement of a right-sided PICC with tip over central SVC. Bilateral interstitial nodular opacities primarily involving the right mid to lower lung field, new or progressed compared to the prior study and likely represent congestive changes or edema. Pneumonia is not excluded. Clinical correlation is recommended. There is no pleural effusion or pneumothorax. Top-normal cardiac silhouette. No acute osseous pathology. IMPRESSION: Right-sided PICC with tip over central SVC.  No pneumothorax. Interval increase in pulmonary nodular densities primarily involving the right lung likely related to interval worsening of the congestion or edema. Pneumonia is not excluded. Clinical correlation and follow-up recommended. Electronically Signed   By: Anner Crete M.D.   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 Improved with IV steroids although continues to have some symptoms. I reviewed her case with Dr. Carlean Purl as well as workup to date. Biopsies not classic for Crohns (active inflammation only) but imaging strongly suggests it, especially with fisulizing disease. Hepatitis testing negative, quantiferon indeterminate. C diff negative.   We had planned to start Remicade once TB testing negative, but in light of new pneumonia and fevers we should not start it at this time. Further, quan gold was indeterminate, so placed PPD today. Will further reduce solumedrol to 40mg  daily given pneumonia. Once pneumonia is under control and she is improved / afebrile, we can consider Remicade.   Recommend at this time: - agree with repeat quantiferon gold - I have ordered PPD - agree with change in antibiotics for PNA - will need to hold on Remicade until pneumonia better controlled and TB testing  negative - flu shot and IM vaccine if not yet given - reduce solumedrol to 40mg  once daily - lovenox for DVT prophylaxis  Please call with questions.  [place] Cellar, MD [facility] Pager [phone]

## 2015-11-03 NOTE — Progress Notes (Addendum)
T- 103.1 oral . Ax. Temp 101.1.rectal temp 102.0 r.n. Aware K.Kirby N.P. Aware  Also K.Kirby  N.P. Aware of abnl. labs see orders

## 2015-11-03 NOTE — Progress Notes (Addendum)
Nutrition Consult/Brief Note  RD consulted for poor PO intake.  Spoke with pt and family at bedside.  Pt is not particularly happy with her current diet order (Full Liquids) given limited choices, therefore, her PO intake has been poor.  Feels she would eat better with solid food.  Denies N/V/D.  Spoke with Dr. Havery Moros with [facility] regarding Soft diet advancement.  Dr. Havery Moros agreeable.  Verbal with readback order placed.  Arthur Holms, RD, LDN Pager #: [phone] After-Hours Pager #: [phone]

## 2015-11-03 NOTE — Progress Notes (Signed)
Patient arrived to rm. [id number]. W/ all belongings and K.Kirby N.P. To call daughter and let her know of transfer and pt. cond.

## 2015-11-03 NOTE — Progress Notes (Signed)
RT called to room to access patient for low O2 sats. Patient currently on 4 lpm [place], spo2 at 95%.  Patient preparing to go to CT. RN concerned about oxygenation during transport due to desaturation on [place].  Placed patient on 505 venturi mask. Patient currently at 96%  CPAP transported to 4E26.

## 2015-11-03 NOTE — Progress Notes (Signed)
Patient off cpap to go to ct . O2 2 L/M [place]. Applied at [employer]. Suggestion O2 sat came down to 85 [employer] O2 to 3 l/m [place], 90% O2 sats. Inc. O2 to 4l/m n.c. sats 94%-98% Resp. Call to assess pt. And change her to Vent. Mask 50% Patient was taken to C.T. With myself and R.N. And monitor and o2. Patietn tol well. Report was call to R.N. 4:30 a.m. And transfer with belongings to [phone]

## 2015-11-03 NOTE — Progress Notes (Signed)
Spoke with Dr. Carles Collet regarding TB testing. Testing is related to the start of Remicade, not for suspicion of active TB, therefore patient does not need negative pressure room. Will continue to monitor.

## 2015-11-03 NOTE — Plan of Care (Signed)
Problem: Skin Integrity: Goal: Risk for impaired skin integrity will decrease Outcome: Progressing Patient has evidence of old sacral scar, however, able to turn self in bed.  Problem: Tissue Perfusion: Goal: Risk factors for ineffective tissue perfusion will decrease Outcome: Progressing Patient wearing SCD's. Stated her legs were bothering her and that she thought it would help with the throbbing. Improvement noted after application.  Problem: Nutrition: Goal: Adequate nutrition will be maintained Outcome: Progressing Patient had decreased appetite during breakfast and lunch d/t options on her tray with a full liquid diet. Patient unable to consume dairy or acidic foods with Crohn's. Called nutrition services to fix this. Patient MD ordered soft diet which was more easily tolerated by patient.

## 2015-11-03 NOTE — Progress Notes (Signed)
Pharmacy Antibiotic Note  Nancy Acevedo is a 55 y.o. female admitted on 10/27/2015 with sepsis/exacerbation of crohns.  Pharmacy has been consulted for Vancomycin/Zosyn dosing. Pt has been on Cipro/Flagyl for several days but now has fever and a CT angio with likely multi-focal PNA. Switching to vancomycin and zosyn and DC cipro/flagyl. Zosyn will provide intra-abdominal coverage. WBC WNL. Mild bump in Scr with CrCl ~50.   Vancomycin 10/17>> Zosyn 10/17>> Cipro 10/13>>10/16 Flagyl 10/13>>10/16  Plan: -Vancomycin 750 mg IV q12h -Zosyn 3.375G IV q8h to be infused over 4 hours -Trend WBC, temp, renal function  -Drug levels as indicated   Height: 5\' 4"  (162.6 cm) Weight: 198 lb 9.6 oz (90.1 kg) IBW/kg (Calculated) : 54.7  Temp (24hrs), Avg:100.5 F (38.1 C), Min:97.8 F (36.6 C), Max:103.1 F (39.5 C)   Recent Labs Lab 11/02/2015 1510 11/04/2015 1600 11/08/2015 2245 10/31/15 0219 11/01/15 0146 11/02/15 0415 11/03/15 0235  WBC 14.7*  --   --  12.2* 11.7* 9.6 8.9  CREATININE 1.36*  --   --  1.41* 1.15* 1.21* 1.33*  LATICACIDVEN  --  2.66* 1.0 1.3  --   --  1.8    Estimated Creatinine Clearance: 53.2 mL/min (by C-G formula based on SCr of 1.33 mg/dL (H)).    Allergies  Allergen Reactions  . Lexapro [Escitalopram] Itching  . Losartan   . Spironolactone     rash  . Clonidine Derivatives Palpitations     Nancy Acevedo 11/03/2015 5:39 AM

## 2015-11-03 NOTE — Care Management Note (Signed)
Case Management Note  Patient Details  Name: Nancy Acevedo MRN: [id number] Date of Birth: Apr 19, 1960  Subjective/Objective:   CM met with pt and mother @ bedside.  Pt reports she has supportive family and friends, was independent PTA.  She and mother are concerned re her PO intake, state she is receiving things she can't eat, so she has been eating very little since she has been hospitalized.  Nutritionist will eval and provide suggestions.                        Expected Discharge Plan:  Home/Self Care  Discharge planning Services  CM Consult  Status of Service:  In process, will continue to follow  Girard Cooter, RN 11/03/2015, 12:26 PM

## 2015-11-04 ENCOUNTER — Inpatient Hospital Stay (HOSPITAL_COMMUNITY): Payer: BC Managed Care – PPO

## 2015-11-04 ENCOUNTER — Other Ambulatory Visit: Payer: Self-pay

## 2015-11-04 DIAGNOSIS — J9601 Acute respiratory failure with hypoxia: Secondary | ICD-10-CM

## 2015-11-04 DIAGNOSIS — L8990 Pressure ulcer of unspecified site, unspecified stage: Secondary | ICD-10-CM | POA: Insufficient documentation

## 2015-11-04 LAB — QUANTIFERON IN TUBE
QFT TB AG MINUS NIL VALUE: <0 IU/mL
QUANTIFERON MITOGEN VALUE: 0.22 IU/mL
QUANTIFERON NIL VALUE: 0.06 [IU]/mL
QUANTIFERON TB AG VALUE: 0.05 [IU]/mL
QUANTIFERON TB GOLD: UNDETERMINED

## 2015-11-04 LAB — BASIC METABOLIC PANEL
ANION GAP: 10 (ref 5–15)
BUN: 17 mg/dL (ref 6–20)
CALCIUM: 8.3 mg/dL — AB (ref 8.9–10.3)
CO2: 26 mmol/L (ref 22–32)
CREATININE: 1.53 mg/dL — AB (ref 0.44–1.00)
Chloride: 98 mmol/L — ABNORMAL LOW (ref 101–111)
GFR, EST AFRICAN AMERICAN: 44 mL/min — AB (ref >60)
GFR, EST NON AFRICAN AMERICAN: 38 mL/min — AB (ref >60)
Glucose, Bld: 142 mg/dL — ABNORMAL HIGH (ref 65–99)
Potassium: 3.3 mmol/L — ABNORMAL LOW (ref 3.5–5.1)
SODIUM: 134 mmol/L — AB (ref 135–145)

## 2015-11-04 LAB — CBC
HCT: 30.5 % — ABNORMAL LOW (ref 36.0–46.0)
HEMOGLOBIN: 10 g/dL — AB (ref 12.0–15.0)
MCH: 28.3 pg (ref 26.0–34.0)
MCHC: 32.8 g/dL (ref 30.0–36.0)
MCV: 86.4 fL (ref 78.0–100.0)
PLATELETS: 66 10*3/uL — AB (ref 150–400)
RBC: 3.53 MIL/uL — AB (ref 3.87–5.11)
RDW: 17.5 % — ABNORMAL HIGH (ref 11.5–15.5)
WBC: 8.2 10*3/uL (ref 4.0–10.5)

## 2015-11-04 LAB — GLUCOSE, CAPILLARY
GLUCOSE-CAPILLARY: 107 mg/dL — AB (ref 65–99)
GLUCOSE-CAPILLARY: 165 mg/dL — AB (ref 65–99)
Glucose-Capillary: 164 mg/dL — ABNORMAL HIGH (ref 65–99)
Glucose-Capillary: 111 mg/dL — ABNORMAL HIGH (ref 65–99)

## 2015-11-04 LAB — QUANTIFERON TB GOLD ASSAY (BLOOD)

## 2015-11-04 LAB — MAGNESIUM: MAGNESIUM: 1.9 mg/dL (ref 1.7–2.4)

## 2015-11-04 MED ORDER — POTASSIUM CHLORIDE 10 MEQ/100ML IV SOLN
10.0000 meq | INTRAVENOUS | Status: AC
  Administered 2015-11-04 (×3): 10 meq via INTRAVENOUS
  Filled 2015-11-04 (×3): qty 100

## 2015-11-04 MED ORDER — HYDROCOD POLST-CPM POLST ER 10-8 MG/5ML PO SUER
5.0000 mL | Freq: Every evening | ORAL | Status: DC | PRN
  Administered 2015-11-04: 5 mL via ORAL
  Filled 2015-11-04: qty 5

## 2015-11-04 MED ORDER — PREDNISONE 20 MG PO TABS: 20.0000 mg | ORAL_TABLET | Freq: Two times a day (BID) | ORAL | 0 refills | Status: AC

## 2015-11-04 NOTE — Progress Notes (Addendum)
RN called for pt feeling weak and SOB after getting up to restroom, fever 102.3 oral and axillary. BP 149/82, HR 93, RR 30's, 100% CPAP. Reported Lungs clear through out. Writer suggested bedside RN to give pt po or pr Tylenol for temp. PT uses CPAP HS at home for sleep apnea. RN called to make me aware of pt for radar list and NP had been contacted with new orders placed and completed or in process. Per RN pt in no acute distress.  [id number] RN called requesting me to transport pt to CT due to pt desat to 85% when removed from CPAP and placed on 2L o2 [place], pt's sats increased to 96% on 4L . RT at bedside to per RR request prior to transport to CT. Pt sats 96%, Tracy RT placed pt on venti mask for transport for RN comfort. Pt transported to CT by 2 RN's.

## 2015-11-04 NOTE — Progress Notes (Signed)
PROGRESS NOTE  Nancy Acevedo [id number] DOB: March 16, 1960 DOA: 10/31/2015 PCP: Binnie Rail, MD  Brief History: 55 y.o.femalewith medical history significant for hypertension, chronic kidney disease stage II, OSA, GERD, and recent diagnosis of Crohn's ileocolitis managed with prednisone who presentedto the [place] emergency department with 2-3 days of fevers, nausea, vomiting, and nonbloody diarrhea. Clinical picture was unclear until recently when when patient had MR enterography on 10/27/2015 showing likely Crohn's with entero-enterofistula, patient had been on prednisone40 mg daily and isin process of being started onRemicade. Pt has had abd pain x 6 months resulting in colonoscopy on 07/31/15 (Dr. Ardis Hughs). This showed a largeulcer ileocecal valve, abnormal mucosa of the TI region, seven punctate, deep ulcers in the rt colon all about 6 mm across; Path-active inflammation with ulceration, no dysplasia. Abdominal pain worsening in past 2 weeks to point patient "could hardly walk". Along with this the patient has been having at least 10 loose stools per day, but denies seeing any bright red blood or melena. The patient has had some fevers and nausea as well as vomiting within the past few days. On the night of 11/02/2015, the patient developed respiratory distress. CT and her grandmother chest was obtained and was negative for pulmonary embolus but showed bilateral diffuse groundglass opacities. Chest x-ray showed increasing right lower lobe nodular opacity. The patient was started on intravenous Zosyn and moved to the stepdown unit.  Patient has also had sharp substernal CP for past several months. She claims increase dyspnea on exertion walking up her stairs at home along with exertional chest pain for 1-2 months. She has had an extensive cardiac work upin January 2017 when she had exertional chest pain associated with syncope. She underwent heart  catheterization on 02/11/2015 which revealed normal coronaries with EF 55-60%. Typical cardiac rhythm at the time of discharge EF 60-65% with no wall motion and mellitus.  Subjective: Patient seen and examined at bedside, improved from yesterday. No acute events overnight, patient has having some episode of diarrhea since last night.   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 -Continue IV Cipro and Flagyl -C diff negative  -Will continue to monitor   Acute respiratory failure with hypoxia - CXR improved this AM  -secondary to pulmonary edema superimposed upon PNA -continue zosyn -check procalcitonin -wean oxygen for saturation > 92 % - Hold lasix for now   Pulmonary Edema improving, -likely due to IVF - wean O2 as tolerated  --Echocardiogram--EF 55-65%, no WMA, trivial MR/TR -Lasix 40 mg IV x 1  RLL infiltrate/Ground glass opacities -?HCAP vs pulmonary edema -check procalcitonin -continue empiric zosyn -follow blood culture  Chest pain/Elevated troponin -demand ischemia in setting of sepsi -some typical and atypical components -Completely reproducible on palpation of sternum -02/11/15 cardiac cath--normal coronaries -personally reviewed EKG--sinus, early repol, nonspecific T-wave changes -CXR-increase interstitial markings--not fluid overloaded clinically -Echocardiogram--EF 55-65%, no WMA, trivial MR/TR  Indeterminant QuanteriFERON -checking for latent TB for remicade -NO clinical suspicion for active pulm TB  CKD 2 -Baseline creatinine 1-1.2  Hypertension -Continue amlodipine and metoprolol tartrate -pt endorsed poor compliance  with meds at home missing 2-3 days per week  Diabetes mellitus type 2 -10/02/2015 hemoglobin A1c 6.6 -  Continue NovoLog sliding scale -pt not on any agents at home -elevated CBGs due to steroids  GERD -10/22/14--EGD--The esophageal mucosa was abnormal appearing throughout but mainly in the proximal 1/2 of the esophagus. There were numerous linear and irregularly patterned furrows throughout.  Thrombocytopenia -likely due to sepsis, but pt with hx of B12 deficiency -not completely compliant with B12 supplements at home -check B12--707 -am CBC  Anemia - stable  -transfused 2 units PRBCs 11/01/15  Disposition Plan: Not stable for d/c Family Communication: Family at bedside updated 11/03/15--Total time spent 60minutes. Greater than 50% spent face to face counseling and coordinating care.  Consultants:[facility]  Code Status: FULL  DVT Prophylaxis: SCDs   Procedures: As Listed in Progress Note Above  Antibiotics: cipro 10/13>>>10/16 Flagyl 10/13>>>10/16 Vanco 11/03/15 x 1 Zosyn 11/02/15>>>   Objective: Vitals:   11/03/15 2219 11/03/15 2332 11/04/15 0417 11/04/15 0832  BP:  135/80 (!) 109/98 (!) 144/87  Pulse:  85 71 80  Resp:  (!) 27 (!) 21 (!) 26  Temp:  99 F (37.2 C) 98 F (36.7 C) 98.7 F (37.1 C)  TempSrc:  Oral Oral Oral  SpO2: 94% 98% 97% 99%  Weight:      Height:        Intake/Output Summary (Last 24 hours) at 11/04/15 0904 Last data filed at 11/04/15 0417  Gross per 24 hour  Intake              203 ml  Output              652 ml  Net             -449 ml   Weight change:  Exam:   General:  Pt is alert, follows commands appropriately, not in acute distress  HEENT: No icterus, No thrush, No neck mass, [place]/AT  Cardiovascular: RRR, S1/S2, no rubs, no gallops  Respiratory: Bibasilar crackles, right greater than left. No wheezing. Abdomen.  Abdomen: Soft/+BS, diffusely tender without any rebound, non distended, no  guarding  Extremities: trace LE edema, No lymphangitis, No petechiae, No rashes, no synovitis   Data Reviewed: I have personally reviewed following labs and imaging studies Basic Metabolic Panel:  Recent Labs Lab 10/29/2015 2245 10/31/15 0219 11/01/15 0146 11/02/15 0415 11/03/15 0235 11/04/15 0413  NA  --  139 138 139 136 134*  K  --  4.1 3.8 3.9 3.4* 3.3*  CL  --  108 109 108 104 98*  CO2  --  22 22 22 24 26   GLUCOSE  --  200* 146* 163* 121* 142*  BUN  --  20 17 14 11 17   CREATININE  --  1.41* 1.15* 1.21* 1.33* 1.53*  CALCIUM  --  7.7* 7.7* 8.4* 8.0* 8.3*  MG 1.4*  --  1.6* 2.4  --  1.9   Liver Function Tests:  Recent Labs Lab 11/08/2015 1510 10/31/15 0219 11/03/15 0235  AST 27 24 52*  ALT 17 14 21   ALKPHOS 62 53 161*  BILITOT 1.6* 1.2 0.8  PROT 6.5 5.2* 5.3*  ALBUMIN 3.3* 2.6* 2.5*    Recent Labs Lab 11/16/2015 1510  LIPASE 17   No results for input(s): AMMONIA in the last 168 hours. Coagulation Profile:  Recent Labs Lab 11/11/2015 2245  INR 0.98   CBC:  Recent Labs Lab 11/03/2015 1510 10/31/15 0219 11/01/15 0146 11/02/15 0415 11/03/15 0235 11/04/15 0413  WBC 14.7* 12.2* 11.7* 9.6 8.9 8.2  NEUTROABS 12.8* 11.4*  --   --  8.2*  --   HGB 9.7* 8.0* 6.8* 9.8* 9.8* 10.0*  HCT 29.8* 25.0* 20.8* 29.8* 29.5* 30.5*  MCV 91.4 88.7 89.3 86.4 87.0 86.4  PLT 111* 88* 94* 128* 140* 66*   Cardiac Enzymes:  Recent Labs Lab 10/31/15 1516 10/31/15 2039 11/01/15 0146  TROPONINI 0.09* 0.11* 0.09*   BNP: Invalid input(s): POCBNP CBG:  Recent Labs Lab 11/03/15 0749 11/03/15 1228 11/03/15 1621 11/03/15 2142 11/04/15 0826  GLUCAP 116* 123* 165* 183* 107*   HbA1C: No results for input(s): HGBA1C in the last 72 hours. Urine analysis:    Component Value Date/Time   COLORURINE YELLOW 11/03/2015 0457   APPEARANCEUR CLOUDY (A) 11/03/2015 0457   LABSPEC 1.014 11/03/2015 0457   PHURINE 5.5 11/03/2015 0457   GLUCOSEU NEGATIVE 11/03/2015 0457   GLUCOSEU  NEGATIVE 04/15/2015 1209   HGBUR NEGATIVE 11/03/2015 0457   BILIRUBINUR NEGATIVE 11/03/2015 0457   KETONESUR NEGATIVE 11/03/2015 0457   PROTEINUR 30 (A) 11/03/2015 0457   UROBILINOGEN 0.2 04/15/2015 1209   NITRITE NEGATIVE 11/03/2015 0457   LEUKOCYTESUR NEGATIVE 11/03/2015 0457   Sepsis Labs: @LABRCNTIP (procalcitonin:4,lacticidven:4) ) Recent Results (from the past 240 hour(s))  C difficile quick screen w PCR reflex     Status: None   Collection Time: 10/31/15 10:00 AM  Result Value Ref Range Status   C Diff antigen NEGATIVE NEGATIVE Final   C Diff toxin NEGATIVE NEGATIVE Final   C Diff interpretation No C. difficile detected.  Final  Culture, blood (routine x 2)     Status: None (Preliminary result)   Collection Time: 11/03/15  2:24 AM  Result Value Ref Range Status   Specimen Description BLOOD LEFT ANTECUBITAL  Final   Special Requests BOTTLES DRAWN AEROBIC AND ANAEROBIC 5CC  Final   Culture NO GROWTH < 12 HOURS  Final   Report Status PENDING  Incomplete  Culture, blood (routine x 2)     Status: None (Preliminary result)   Collection Time: 11/03/15  2:30 AM  Result Value Ref Range Status   Specimen Description BLOOD LEFT HAND  Final   Special Requests BOTTLES DRAWN AEROBIC AND ANAEROBIC 5CC  Final   Culture NO GROWTH < 12 HOURS  Final   Report Status PENDING  Incomplete  MRSA PCR Screening     Status: None   Collection Time: 11/03/15  5:09 AM  Result Value Ref Range Status   MRSA by PCR NEGATIVE NEGATIVE Final    Comment:        The GeneXpert MRSA Assay (FDA approved for NASAL specimens only), is one component of a comprehensive MRSA colonization surveillance program. It is not intended to diagnose MRSA infection nor to guide or monitor treatment for MRSA infections.      Scheduled Meds: . amLODipine  10 mg Oral Daily  . enoxaparin (LOVENOX) injection  40 mg Subcutaneous Q24H  . famotidine  20 mg Oral BID  . insulin aspart  0-5 Units Subcutaneous QHS  .  insulin aspart  0-9 Units Subcutaneous TID WC  . methylPREDNISolone (SOLU-MEDROL) injection  40 mg Intravenous Q24H  . metoprolol tartrate  50 mg Oral BID  . piperacillin-tazobactam (ZOSYN)  IV  3.375 g Intravenous Q8H  . sodium chloride flush  10-40 mL Intracatheter Q12H  . sodium chloride flush  3 mL Intravenous Q12H  . tuberculin  5 Units Intradermal Once   Continuous Infusions:   Procedures/Studies: Dg Chest 2 View  Result Date: 11/01/2015 CLINICAL DATA:  Pulmonary edema. EXAM: CHEST  2 VIEW COMPARISON:  10/25/2015 FINDINGS: Two views study shows upper normal heart size. Diffuse interstitial opacity suggests edema. No focal airspace consolidation or substantial pleural effusion. The visualized bony structures of the thorax are intact. Telemetry leads overlie the chest. IMPRESSION: Slight increase in diffuse interstitial opacities suggesting edema. Electronically Signed   By: Misty Stanley M.D.   On: 11/01/2015 08:23   Dg Chest 2 View  Result Date: 10/22/2015 CLINICAL DATA:  Chest pain and coughing for 2 weeks. EXAM: CHEST  2 VIEW COMPARISON:  02/09/2015 FINDINGS: AP and lateral views of the chest show low volumes. The cardio pericardial silhouette is enlarged. There is pulmonary vascular congestion without overt pulmonary edema. The visualized bony structures of the thorax are intact. Telemetry leads overlie the chest. IMPRESSION: Cardiomegaly with pulmonary vascular congestion. Electronically Signed   By: Misty Stanley M.D.   On: 11/12/2015 17:37   Ct Angio Chest Pe W Or Wo Contrast  Result Date: 11/03/2015 CLINICAL DATA:  55 year old female with shortness of breath and tachycardia. EXAM: CT ANGIOGRAPHY CHEST WITH CONTRAST TECHNIQUE: Multidetector CT imaging of the chest was performed using the standard protocol during bolus administration of intravenous contrast. Multiplanar CT image reconstructions and MIPs were obtained to evaluate the vascular anatomy. CONTRAST:  50 cc Isovue 370  COMPARISON:  Chest radiograph dated 11/03/2015. FINDINGS: Cardiovascular: The thoracic aorta appears unremarkable. The origins of the great vessels of the aortic arch appear patent. There is no CT evidence of pulmonary embolism. Mediastinum/Nodes: Right hilar adenopathy. Top-normal left hilar lymph nodes. No significant mediastinal adenopathy. The esophagus is grossly unremarkable. No thyroid nodules identified. Lungs/Pleura: There is diffuse ground-glass airspace opacity throughout the lungs. Confluent nodular consolidative changes bilaterally most compatible with multifocal pneumonia. Correlation with clinical exam and follow-up to resolution recommended. Small left pleural effusion. There is no pneumothorax. The central airways are patent. Upper Abdomen: Fatty infiltration of the liver. There is thickened appearance of the visualized portion of the splenic flexure compatible with colitis. Musculoskeletal: No chest wall abnormality. No acute or significant osseous findings. Review of the MIP images confirms the above findings. IMPRESSION: No CT evidence of pulmonary embolism. Extensive bilateral ground-glass density and confluent nodular consolidative changes most compatible with multifocal pneumonia. Clinical correlation and follow-up recommended. Inflammatory changes of the splenic flexure compatible with colitis. Correlation with clinical exam and stool cultures recommended. Electronically Signed   By: Anner Crete M.D.   On: 11/03/2015 05:13   Ct Abdomen Pelvis W Contrast  Result Date: 10/23/2015 CLINICAL DATA:  Crohn's disease. Diffuse abdominal pain. Nausea, vomiting and diarrhea. EXAM: CT ABDOMEN AND PELVIS WITH CONTRAST TECHNIQUE: Multidetector CT imaging of the abdomen and pelvis was performed using the standard protocol following bolus administration of intravenous contrast. CONTRAST:  142mL ISOVUE-300 IOPAMIDOL (ISOVUE-300) INJECTION 61% COMPARISON:  MR enterography 10/27/2015 FINDINGS: Lower  chest: No pulmonary nodules. No visible pleural or pericardial effusion. Hepatobiliary: Normal hepatic size and contours without focal liver lesion. No perihepatic ascites. No intra- or extrahepatic biliary dilatation. Normal gallbladder. Pancreas: Normal pancreatic contours and enhancement. No peripancreatic fluid collection or pancreatic ductal dilatation. Spleen: Normal. Adrenals/Urinary Tract: Normal adrenal glands. No hydronephrosis or solid renal mass. Stomach/Bowel: There is long segment bowel wall thickening and surrounding inflammatory stranding extending from the terminal ileum to the junction of the descending and sigmoid colon. There is again seen a suspected fistula between the terminal ileum and an adjacent loop of right lower quadrant bowel (sagittal image 43, coronal image 51). The appendix is not visualized. No abscess or focal  fluid collection. No dilated small bowel. No evidence of focal small bowel inflammation. Vascular/Lymphatic: Normal course and caliber of the major abdominal vessels. No abdominal or pelvic adenopathy. Reproductive: Hyperenhancing focus at the uterine fundus is likely a small fibroid. Ovaries are normal. Musculoskeletal: Multilevel osteophytosis and facet arthrosis. No bony spinal canal stenosis. No lytic or blastic lesions. Normal visualized extrathoracic and extraperitoneal soft tissues. Other: No contributory non-categorized findings. IMPRESSION: 1. Long segment bowel inflammation extending from the terminal ileum to the junction of the descending and sigmoid colon, consistent with active Crohn's disease. No abscess or fluid collection. 2. 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 Electronically Signed   By: Ulyses Jarred M.D.   On: 11/01/2015 17:49   Mr Consuela Mimes W/o W/cm  Result Date: 10/27/2015 CLINICAL DATA:  55 year old female with Crohn disease and abdominal pain. History of  appendectomy. EXAM: MR ABDOMEN AND PELVIS WITHOUT AND WITH CONTRAST (MR ENTEROGRAPHY) TECHNIQUE: Multiplanar, multisequence MRI of the abdomen and pelvis was performed both before and during bolus administration of intravenous contrast. Negative oral contrast VoLumen was given. CONTRAST:  56mL MULTIHANCE GADOBENATE DIMEGLUMINE 529 MG/ML IV SOLN COMPARISON:  09/29/2015 CT abdomen/pelvis. FINDINGS: COMBINED FINDINGS FOR BOTH MR ABDOMEN AND PELVIS Many of the sequences are motion degraded. Lower chest: Clear lung bases. Stable mild elevation of the left hemidiaphragm. Hepatobiliary: Normal liver size and configuration. No liver mass. Normal gallbladder with no cholelithiasis. No biliary ductal dilatation. Common bile duct diameter 2 mm. No choledocholithiasis. Pancreas: No pancreatic mass or duct dilation.  No pancreas divisum. Spleen: Normal size. No mass. Adrenals/Urinary Tract: Normal adrenals. No hydronephrosis. Simple subcentimeter exophytic renal cyst in the far upper right kidney. Otherwise normal kidneys, with no suspicious renal mass. Stomach/Bowel: Normal stomach. Normal caliber small bowel. There is mild wall thickening and mucosal hyperenhancement in the terminal ileum (series 1201/ image 96). There is a closely approximated distal small bowel loop in the right abdomen anterior to the terminal ileum, with a suspected short 1 cm in length fistula between the terminal ileum and this adjacent right abdominal small bowel loop (series 14/ image 85). There is a separate approximately 5 cm in length loop of small bowel in the left abdomen demonstrating mild mucosal hyperenhancement (series 1201/ image 89). Otherwise no small bowel wall thickening or wall hyperenhancement. No focal small bowel caliber transition. Status post appendectomy. Normal caliber large bowel. No large bowel wall thickening or wall hyperenhancement. Mild descending and proximal sigmoid colonic diverticulosis. No pericolonic or mesenteric  edema. Vascular/Lymphatic: Normal caliber abdominal aorta. Patent portal, splenic, hepatic and renal veins. No pathologically enlarged lymph nodes in the abdomen. Reproductive: Anterior upper uterine body subserosal enhancing 1.8 x 1.6 x 1.9 cm fibroid. Unremarkable ovaries. No adnexal masses. Other: No abdominal ascites or focal fluid collection. Musculoskeletal: No aggressive appearing focal osseous lesions. IMPRESSION: 1. Mild active Crohn terminal ileitis. Suspected short 1 cm in length fistula between the terminal ileum and an adjacent right abdominal small bowel loop anterior to the terminal ileum. 2. Possible mild active Crohn enteritis within a separate short proximal left abdominal small bowel segment. 3. No abscess.  No evidence of bowel obstruction. 4. Solitary subserosal uterine fibroid. Electronically Signed   By: Ilona Sorrel M.D.   On: 10/27/2015 18:33   Mr [place] Pelvis W/o W/cm  Result Date: 10/27/2015 CLINICAL DATA:  55 year old female with Crohn disease and abdominal pain. History of appendectomy. EXAM: MR ABDOMEN AND PELVIS WITHOUT  AND WITH CONTRAST (MR ENTEROGRAPHY) TECHNIQUE: Multiplanar, multisequence MRI of the abdomen and pelvis was performed both before and during bolus administration of intravenous contrast. Negative oral contrast VoLumen was given. CONTRAST:  84mL MULTIHANCE GADOBENATE DIMEGLUMINE 529 MG/ML IV SOLN COMPARISON:  09/29/2015 CT abdomen/pelvis. FINDINGS: COMBINED FINDINGS FOR BOTH MR ABDOMEN AND PELVIS Many of the sequences are motion degraded. Lower chest: Clear lung bases. Stable mild elevation of the left hemidiaphragm. Hepatobiliary: Normal liver size and configuration. No liver mass. Normal gallbladder with no cholelithiasis. No biliary ductal dilatation. Common bile duct diameter 2 mm. No choledocholithiasis. Pancreas: No pancreatic mass or duct dilation.  No pancreas divisum. Spleen: Normal size. No mass. Adrenals/Urinary Tract: Normal adrenals. No hydronephrosis.  Simple subcentimeter exophytic renal cyst in the far upper right kidney. Otherwise normal kidneys, with no suspicious renal mass. Stomach/Bowel: Normal stomach. Normal caliber small bowel. There is mild wall thickening and mucosal hyperenhancement in the terminal ileum (series 1201/ image 96). There is a closely approximated distal small bowel loop in the right abdomen anterior to the terminal ileum, with a suspected short 1 cm in length fistula between the terminal ileum and this adjacent right abdominal small bowel loop (series 14/ image 85). There is a separate approximately 5 cm in length loop of small bowel in the left abdomen demonstrating mild mucosal hyperenhancement (series 1201/ image 89). Otherwise no small bowel wall thickening or wall hyperenhancement. No focal small bowel caliber transition. Status post appendectomy. Normal caliber large bowel. No large bowel wall thickening or wall hyperenhancement. Mild descending and proximal sigmoid colonic diverticulosis. No pericolonic or mesenteric edema. Vascular/Lymphatic: Normal caliber abdominal aorta. Patent portal, splenic, hepatic and renal veins. No pathologically enlarged lymph nodes in the abdomen. Reproductive: Anterior upper uterine body subserosal enhancing 1.8 x 1.6 x 1.9 cm fibroid. Unremarkable ovaries. No adnexal masses. Other: No abdominal ascites or focal fluid collection. Musculoskeletal: No aggressive appearing focal osseous lesions. IMPRESSION: 1. Mild active Crohn terminal ileitis. Suspected short 1 cm in length fistula between the terminal ileum and an adjacent right abdominal small bowel loop anterior to the terminal ileum. 2. Possible mild active Crohn enteritis within a separate short proximal left abdominal small bowel segment. 3. No abscess.  No evidence of bowel obstruction. 4. Solitary subserosal uterine fibroid. Electronically Signed   By: Ilona Sorrel M.D.   On: 10/27/2015 18:33   Dg Chest Port 1 View  Result Date:  11/03/2015 CLINICAL DATA:  55 year old female with shortness of breath and tachypnea EXAM: PORTABLE CHEST 1 VIEW COMPARISON:  Chest radiograph dated 11/01/2015 FINDINGS: There has been interval placement of a right-sided PICC with tip over central SVC. Bilateral interstitial nodular opacities primarily involving the right mid to lower lung field, new or progressed compared to the prior study and likely represent congestive changes or edema. Pneumonia is not excluded. Clinical correlation is recommended. There is no pleural effusion or pneumothorax. Top-normal cardiac silhouette. No acute osseous pathology. IMPRESSION: Right-sided PICC with tip over central SVC.  No pneumothorax. Interval increase in pulmonary nodular densities primarily involving the right lung likely related to interval worsening of the congestion or edema. Pneumonia is not excluded. Clinical correlation and follow-up recommended. Electronically Signed   By: Anner Crete M.D.   On: 11/03/2015 02:51    Chipper Oman, MD   [facility] Pager [phone]  If 7PM-7AM, please contact night-coverage [email or web address] Password [email or web address] 11/04/2015, 9:04 AM   LOS: 5 days

## 2015-11-04 NOTE — Progress Notes (Signed)
Progress Note   Subjective  Patient reports her breathing seems to be more comfortable today, although continues to have coughing. She continues to have abdominal pain but was requesting to eat more solid foods. She endorses continuing loose stools.    Objective   Vital signs in last 24 hours: Temp:  [98 F (36.7 C)-100.3 F (37.9 C)] 100.3 F (37.9 C) (10/18 1652) Pulse Rate:  [71-100] 86 (10/18 1652) Resp:  [20-27] 20 (10/18 1652) BP: (109-153)/(64-98) 109/64 (10/18 1652) SpO2:  [93 %-100 %] 98 % (10/18 1652) Last BM Date: 11/04/15 General:    AA female in NAD Heart:  Regular rate and rhythm; no murmurs Lungs: Respirations even and unlabored, some coarse BS B Abdomen:  Soft, nontender and nondistended.  Extremities:  Without edema. Neurologic:  Alert and oriented,  grossly normal neurologically. Psych:  Cooperative. Normal mood and affect.  Intake/Output from previous day: 10/17 0701 - 10/18 0700 In: 203 [P.O.:150; I.V.:3; IV Piggyback:50] Out: [id number] [Urine:1326; Stool:1] Intake/Output this shift: Total I/O In: 100 [IV Piggyback:100] Out: -   Lab Results:  Recent Labs  11/02/15 0415 11/03/15 0235 11/04/15 0413  WBC 9.6 8.9 8.2  HGB 9.8* 9.8* 10.0*  HCT 29.8* 29.5* 30.5*  PLT 128* 140* 66*   BMET  Recent Labs  11/02/15 0415 11/03/15 0235 11/04/15 0413  NA 139 136 134*  K 3.9 3.4* 3.3*  CL 108 104 98*  CO2 22 24 26   GLUCOSE 163* 121* 142*  BUN 14 11 17   CREATININE 1.21* 1.33* 1.53*  CALCIUM 8.4* 8.0* 8.3*   LFT  Recent Labs  11/03/15 0235  PROT 5.3*  ALBUMIN 2.5*  AST 52*  ALT 21  ALKPHOS 161*  BILITOT 0.8   PT/INR No results for input(s): LABPROT, INR in the last 72 hours.  Studies/Results: Dg Chest 2 View  Result Date: 11/04/2015 CLINICAL DATA:  Shortness of breath, weakness, pneumonia EXAM: CHEST  2 VIEW COMPARISON:  CT chest of 11/03/2015 FINDINGS: There has been definite improvement in the patchy airspace disease and  nodular opacities scattered throughout the lungs on recent CT most consistent with improving inflammatory or infectious process. Right PICC line is present with the tip within the distal SVC. No pneumothorax is noted. No pleural effusion is seen. Heart size is stable. IMPRESSION: 1. Significant improvement in airspace disease. 2. Right-sided PICC line tip within the lower SVC. Electronically Signed   By: Ivar Drape M.D.   On: 11/04/2015 09:31   Ct Angio Chest Pe W Or Wo Contrast  Result Date: 11/03/2015 CLINICAL DATA:  55 year old female with shortness of breath and tachycardia. EXAM: CT ANGIOGRAPHY CHEST WITH CONTRAST TECHNIQUE: Multidetector CT imaging of the chest was performed using the standard protocol during bolus administration of intravenous contrast. Multiplanar CT image reconstructions and MIPs were obtained to evaluate the vascular anatomy. CONTRAST:  50 cc Isovue 370 COMPARISON:  Chest radiograph dated 11/03/2015. FINDINGS: Cardiovascular: The thoracic aorta appears unremarkable. The origins of the great vessels of the aortic arch appear patent. There is no CT evidence of pulmonary embolism. Mediastinum/Nodes: Right hilar adenopathy. Top-normal left hilar lymph nodes. No significant mediastinal adenopathy. The esophagus is grossly unremarkable. No thyroid nodules identified. Lungs/Pleura: There is diffuse ground-glass airspace opacity throughout the lungs. Confluent nodular consolidative changes bilaterally most compatible with multifocal pneumonia. Correlation with clinical exam and follow-up to resolution recommended. Small left pleural effusion. There is no pneumothorax. The central airways are patent. Upper Abdomen: Fatty infiltration of the liver.  There is thickened appearance of the visualized portion of the splenic flexure compatible with colitis. Musculoskeletal: No chest wall abnormality. No acute or significant osseous findings. Review of the MIP images confirms the above findings.  IMPRESSION: No CT evidence of pulmonary embolism. Extensive bilateral ground-glass density and confluent nodular consolidative changes most compatible with multifocal pneumonia. Clinical correlation and follow-up recommended. Inflammatory changes of the splenic flexure compatible with colitis. Correlation with clinical exam and stool cultures recommended. Electronically Signed   By: Anner Crete M.D.   On: 11/03/2015 05:13   Dg Chest Port 1 View  Result Date: 11/03/2015 CLINICAL DATA:  55 year old female with shortness of breath and tachypnea EXAM: PORTABLE CHEST 1 VIEW COMPARISON:  Chest radiograph dated 11/01/2015 FINDINGS: There has been interval placement of a right-sided PICC with tip over central SVC. Bilateral interstitial nodular opacities primarily involving the right mid to lower lung field, new or progressed compared to the prior study and likely represent congestive changes or edema. Pneumonia is not excluded. Clinical correlation is recommended. There is no pleural effusion or pneumothorax. Top-normal cardiac silhouette. No acute osseous pathology. IMPRESSION: Right-sided PICC with tip over central SVC.  No pneumothorax. Interval increase in pulmonary nodular densities primarily involving the right lung likely related to interval worsening of the congestion or edema. Pneumonia is not excluded. Clinical correlation and follow-up recommended. Electronically Signed   By: Anner Crete M.D.   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 Improved with IV steroids although continues to have some symptoms. Biopsies not classic for Crohns (active inflammation only) but imaging strongly suggests it, especially with fisulizing disease. Hepatitis testing negative, quantiferon indeterminate x2 . C diff negative.   We had planned to start Remicade once TB testing negative, but in light of new hospital  acquired pneumonia we cannot proceed. Further, quan gold was indeterminate x 2, so placed PPD yesterday which is negative thus far. Will continue solumedrol to 40mg  daily and try to wean. She is improved from pulmonary standpoint, but hesitant to give her Remicade in the setting of a recent severe infection. Other options for treatment at this time, which may seem drastic, is to bowel rest her and give her TPN for nutrition which can put fistulizing Crohns into remission. In this setting would bowel rest until she is cleared for anti-TNF therapy. She will think about this and I will reassess her tomorrow.   Of note, patient with thrombocytopenia today which is new, also with elevated AP on recent labs, would repeat tomorrow AM  Recommend at this time: -  Await PPD results - will need to hold on Remicade until pneumonia better controlled and TB testing negative - flu shot and IM vaccine if not yet given - solumedrol 40mg  once daily and will need to taper if pneumonia not improving - repeat LFTs tomorrow - lovenox for DVT prophylaxis  Please call with questions.  Seabrook Cellar, MD [facility] Pager [phone]

## 2015-11-04 NOTE — Progress Notes (Signed)
   11/04/15 0920  Clinical Encounter Type  Visited With Patient  Visit Type Other (Comment) (consult)  Referral From Nurse  Consult/Referral To Chaplain  Spiritual Encounters  Spiritual Needs Prayer;Emotional;Grief support  Stress Factors  Patient Stress Factors Health changes;Family relationships  Offered prayer for health and on her father's recent death. Offered hope and comfort during prayer and all her needs.

## 2015-11-05 LAB — URINALYSIS, ROUTINE W REFLEX MICROSCOPIC
Bilirubin Urine: NEGATIVE
GLUCOSE, UA: NEGATIVE mg/dL
HGB URINE DIPSTICK: NEGATIVE
Ketones, ur: NEGATIVE mg/dL
Leukocytes, UA: NEGATIVE
Nitrite: NEGATIVE
PH: 6 (ref 5.0–8.0)
Protein, ur: 100 mg/dL — AB
SPECIFIC GRAVITY, URINE: 1.023 (ref 1.005–1.030)

## 2015-11-05 LAB — C DIFFICILE QUICK SCREEN W PCR REFLEX
C Diff antigen: NEGATIVE
C Diff interpretation: NOT DETECTED
C Diff toxin: NEGATIVE

## 2015-11-05 LAB — URINE MICROSCOPIC-ADD ON: RBC / HPF: NONE SEEN RBC/hpf (ref 0–5)

## 2015-11-05 LAB — CBC WITH DIFFERENTIAL/PLATELET
BASOS PCT: 0 %
Basophils Absolute: 0 10*3/uL (ref 0.0–0.1)
EOS ABS: 0 10*3/uL (ref 0.0–0.7)
Eosinophils Relative: 0 %
HCT: 28.9 % — ABNORMAL LOW (ref 36.0–46.0)
HEMOGLOBIN: 9.7 g/dL — AB (ref 12.0–15.0)
Lymphocytes Relative: 6 %
Lymphs Abs: 0.3 10*3/uL — ABNORMAL LOW (ref 0.7–4.0)
MCH: 29 pg (ref 26.0–34.0)
MCHC: 33.6 g/dL (ref 30.0–36.0)
MCV: 86.3 fL (ref 78.0–100.0)
MONOS PCT: 2 %
Monocytes Absolute: 0.1 10*3/uL (ref 0.1–1.0)
NEUTROS PCT: 92 %
Neutro Abs: 4 10*3/uL (ref 1.7–7.7)
Platelets: 127 10*3/uL — ABNORMAL LOW (ref 150–400)
RBC: 3.35 MIL/uL — ABNORMAL LOW (ref 3.87–5.11)
RDW: 16.8 % — ABNORMAL HIGH (ref 11.5–15.5)
WBC: 4.3 10*3/uL (ref 4.0–10.5)

## 2015-11-05 LAB — BASIC METABOLIC PANEL
Anion gap: 9 (ref 5–15)
BUN: 17 mg/dL (ref 6–20)
CALCIUM: 8.2 mg/dL — AB (ref 8.9–10.3)
CHLORIDE: 100 mmol/L — AB (ref 101–111)
CO2: 27 mmol/L (ref 22–32)
CREATININE: 1.35 mg/dL — AB (ref 0.44–1.00)
GFR calc non Af Amer: 44 mL/min — ABNORMAL LOW (ref >60)
GFR, EST AFRICAN AMERICAN: 51 mL/min — AB (ref >60)
Glucose, Bld: 100 mg/dL — ABNORMAL HIGH (ref 65–99)
Potassium: 3.7 mmol/L (ref 3.5–5.1)
SODIUM: 136 mmol/L (ref 135–145)

## 2015-11-05 LAB — HEPATIC FUNCTION PANEL
ALBUMIN: 2.4 g/dL — AB (ref 3.5–5.0)
ALT: 15 U/L (ref 14–54)
AST: 22 U/L (ref 15–41)
Alkaline Phosphatase: 89 U/L (ref 38–126)
BILIRUBIN DIRECT: 0.3 mg/dL (ref 0.1–0.5)
Indirect Bilirubin: 0.8 mg/dL (ref 0.3–0.9)
TOTAL PROTEIN: 5.5 g/dL — AB (ref 6.5–8.1)
Total Bilirubin: 1.1 mg/dL (ref 0.3–1.2)

## 2015-11-05 LAB — GLUCOSE, CAPILLARY
GLUCOSE-CAPILLARY: 87 mg/dL (ref 65–99)
GLUCOSE-CAPILLARY: 149 mg/dL — AB (ref 65–99)
Glucose-Capillary: 175 mg/dL — ABNORMAL HIGH (ref 65–99)
Glucose-Capillary: 158 mg/dL — ABNORMAL HIGH (ref 65–99)
Glucose-Capillary: 102 mg/dL — ABNORMAL HIGH (ref 65–99)

## 2015-11-05 LAB — PROCALCITONIN: PROCALCITONIN: 1.19 ng/mL

## 2015-11-05 MED ORDER — VANCOMYCIN HCL IN DEXTROSE 1-5 GM/200ML-% IV SOLN
1000.0000 mg | Freq: Once | INTRAVENOUS | Status: AC
Start: 2015-11-05 — End: 2015-11-05
  Administered 2015-11-05: 1000 mg via INTRAVENOUS
  Filled 2015-11-05: qty 200

## 2015-11-05 MED ORDER — GUAIFENESIN-CODEINE 100-10 MG/5ML PO SOLN
5.0000 mL | Freq: Four times a day (QID) | ORAL | Status: DC | PRN
  Administered 2015-11-05 – 2015-11-06 (×5): 5 mL via ORAL
  Filled 2015-11-05 (×7): qty 5

## 2015-11-05 NOTE — Progress Notes (Signed)
Progress Note   Subjective  Patient had another fever to 103, continues to have oxygen requirement. Continues to have diarrhea and abdominal pain. On antiobiotics, dose of steroids lowered.    Objective   Vital signs in last 24 hours: Temp:  [98.9 F (37.2 C)-103.3 F (39.6 C)] 99.3 F (37.4 C) (10/19 1210) Pulse Rate:  [86-103] 103 (10/19 1000) Resp:  [17-35] 25 (10/19 1000) BP: (109-144)/(64-91) 127/77 (10/19 1000) SpO2:  [88 %-98 %] 91 % (10/19 1000) Weight:  [196 lb 8 oz (89.1 kg)] 196 lb 8 oz (89.1 kg) (10/19 0500) Last BM Date: 11/04/15 General:    AA female,  Heart:  Regular rate and rhythm;  Lungs: Respirations even and unlabored, some coarse BS B Abdomen:  Soft, mild abdominal TTP without rebound or guarding, nondistended.  Extremities:  trace edema. Neurologic:  Alert and oriented,  grossly normal neurologically. Psych:  Cooperative. Normal mood and affect.  Intake/Output from previous day: 10/18 0701 - 10/19 0700 In: 560 [P.O.:360; IV Piggyback:200] Out: 1 [Stool:1] Intake/Output this shift: Total I/O In: 360 [P.O.:360] Out: 350 [Urine:350]  Lab Results:  Recent Labs  11/03/15 0235 11/04/15 0413 11/05/15 0520  WBC 8.9 8.2 4.3  HGB 9.8* 10.0* 9.7*  HCT 29.5* 30.5* 28.9*  PLT 140* 66* 127*   BMET  Recent Labs  11/03/15 0235 11/04/15 0413 11/05/15 0520  NA 136 134* 136  K 3.4* 3.3* 3.7  CL 104 98* 100*  CO2 24 26 27   GLUCOSE 121* 142* 100*  BUN 11 17 17   CREATININE 1.33* 1.53* 1.35*  CALCIUM 8.0* 8.3* 8.2*   LFT  Recent Labs  11/05/15 0520  PROT 5.5*  ALBUMIN 2.4*  AST 22  ALT 15  ALKPHOS 89  BILITOT 1.1  BILIDIR 0.3  IBILI 0.8   PT/INR No results for input(s): LABPROT, INR in the last 72 hours.  Studies/Results: Dg Chest 2 View  Result Date: 11/04/2015 CLINICAL DATA:  Shortness of breath, weakness, pneumonia EXAM: CHEST  2 VIEW COMPARISON:  CT chest of 11/03/2015 FINDINGS: There has been definite improvement in the  patchy airspace disease and nodular opacities scattered throughout the lungs on recent CT most consistent with improving inflammatory or infectious process. Right PICC line is present with the tip within the distal SVC. No pneumothorax is noted. No pleural effusion is seen. Heart size is stable. IMPRESSION: 1. Significant improvement in airspace disease. 2. Right-sided PICC line tip within the lower SVC. Electronically Signed   By: Ivar Drape M.D.   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 She had some improvement with IV steroids however developed hospital acquired pneumonia. Hepatitis testing negative, quantiferon indeterminate x2 . C diff negative x 1 last 5 days ago.   We had planned to start Remicade once TB testing negative, but in light of new hospital acquired pneumonia we cannot proceed until this is improved. Further, quan gold was indeterminate x 2, so placed PPD 2 days ago which is negative thus far, awaiting final read tomorrow. I reviewed her CT scan with radiology - ileal involvement not as impressive as colonic inflammation from the right colon to transverse colon, which is a change for her, previously ileal disease was worse. I would continue solumedrol 40mg  daily and try to wean. Given mostly colonic inflammation, will resend stools for C Diff. I will also check CMV  PCR as she is at risk for this with ongoing steroid use. If positive this will be helpful but negative serology does not rule out tissue invasive disease. Once her pulmonary status improves I think we should proceed with colonoscopy with biopsies to ensure no evidence of CMV. Otherwise, I don't think she is a good surgical candidate right now given the amount of colonic inflammation noted and her ileal disease. Given she is tolerating PO, will allow her to continue to eat and hold off on TPN / bowel rest.   Of note, LAEs now  normal, previously elevated. Platelets improved today.   Hopefully with time, her pneumonia can improve to the point where we can treat her with anti-TNF if this is thought to be IBD and infection rule out.   Recommend at this time: -  await PPD results - C Diff PCR - CMV PCR - once respiratory status improved, will consider colonoscopy - will need to hold on Remicade until pneumonia better controlled and TB testing negative - flu shot and IM vaccine if not yet given - solumedrol 40mg  once daily and will need to taper if pneumonia not improving - lovenox for DVT prophylaxis  Please call with questions  Sudan Cellar, MD [facility] Pager [phone]

## 2015-11-05 NOTE — Progress Notes (Signed)
Patient refusing the use of CPAP at this time. Informed patient if she changes her mind have RN contact RT. 

## 2015-11-05 NOTE — Progress Notes (Signed)
PROGRESS NOTE  Nancy Acevedo [id number] DOB: 10-07-1960 DOA: 11/06/2015 PCP: Binnie Rail, MD  Brief History: 55 y.o.femalewith medical history significant for hypertension, chronic kidney disease stage II, OSA, GERD, and recent diagnosis of Crohn's ileocolitis managed with prednisone who presentedto the [facility] emergency department with 2-3 days of fevers, nausea, vomiting, and nonbloody diarrhea. Clinical picture was unclear until recently when when patient had MR enterography on 10/27/2015 showing likely Crohn's with entero-enterofistula, patient had been on prednisone40 mg daily and isin process of being started onRemicade. Pt has had abd pain x 6 months resulting in colonoscopy on 07/31/15 (Dr. Ardis Hughs). This showed a largeulcer ileocecal valve, abnormal mucosa of the TI region, seven punctate, deep ulcers in the rt colon all about 6 mm across; Path-active inflammation with ulceration, no dysplasia. Abdominal pain worsening in past 2 weeks to point patient "could hardly walk". Along with this the patient has been having at least 10 loose stools per day, but denies seeing any bright red blood or melena. The patient has had some fevers and nausea as well as vomiting within the past few days. On the night of 11/02/2015, the patient developed respiratory distress. CT and her grandmother chest was obtained and was negative for pulmonary embolus but showed bilateral diffuse groundglass opacities. Chest x-ray showed increasing right lower lobe nodular opacity. The patient was started on intravenous Zosyn and moved to the stepdown unit.  Patient has also had sharp substernal CP for past several months. She claims increase dyspnea on exertion walking up her stairs at home along with exertional chest pain for 1-2 months. She has had an extensive cardiac work upin January 2017 when she had exertional chest pain associated with syncope. She underwent heart  catheterization on 02/11/2015 which revealed normal coronaries with EF 55-60%. Typical cardiac rhythm at the time of discharge EF 60-65% with no wall motion and mellitus.  Subjective: Patient seen and examined at bedside, patient feeling worsened yesterday. Steroids were decreased to 40 mg by Gi. Patient have 3 episodes of fever overnight. Continue to complain of persistent cough. Shortness of breath has improved. Bowel movement remained the same, no blood. Denies chest pain, palpitation, dizziness, dysuria and urinary frequency.  Assessment/Plan:  Fever unknown source - patients only symptoms are cough and diarrhea. Xray negative for focal process. Could be related to Crohn's flare. 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 to the small bowel -C diff negative  -Will continue to monitor   Acute respiratory failure with hypoxia - saturations has improved, continues to complain of cough - clear to auscultation on exam -secondary to pulmonary edema superimposed upon PNA -continue zosyn -check procalcitonin -wean oxygen for saturation > 92 % -Lasix on hold  Pulmonary Edema improved -likely due to IVF -wean O2 as tolerated  -Echocardiogram--EF 55-65%, no WMA, trivial MR/TR  RLL infiltrate/Ground glass opacities -?HCAP vs pulmonary edema -procalcitonin 1.19 trending down  -continue empiric zosyn -follow blood culture  Chest pain/Elevated troponin -demand ischemia in setting of sepsi -some typical and atypical components -Completely  reproducible on palpation of  sternum -02/11/15 cardiac cath--normal coronaries -personally reviewed EKG--sinus, early repol, nonspecific T-wave changes -CXR-increase interstitial markings--not fluid overloaded clinically -Echocardiogram--EF 55-65%, no WMA, trivial MR/TR  Indeterminant QuanteriFERON PPD was placed so far negative  -checking for latent TB for remicade -NO clinical suspicion for active pulm TB  CKD 2 -Baseline creatinine 1-1.2  Hypertension -Continue amlodipine and metoprolol tartrate -pt endorsed poor compliance with meds at home missing 2-3 days per week  Diabetes mellitus type 2 - stable -10/02/2015 hemoglobin A1c 6.6 -Continue NovoLog sliding scale  GERD -10/22/14--EGD--The esophageal mucosa was abnormal appearing throughout but mainly in the proximal 1/2 of the esophagus. There were numerous linear and irregularly patterned furrows throughout.  Thrombocytopenia on admission 111, today is 126 - improving -likely due to sepsis, but pt with hx of B12 deficiency -check B12--707 -Monitor  Anemia - stable  -transfused 2 units PRBCs 11/01/15  Disposition Plan: Not stable for d/c Family Communication: None at bedside Code Status: FULL DVT Prophylaxis: SCDs  Consultants:[facility]  Procedures: As Listed in Progress Note Above  Antibiotics: cipro 10/13>>>10/16 Flagyl 10/13>>>10/16 Vanco 11/03/15 x 1 Zosyn 11/02/15>>> Vanco 11/05/15 x1   Objective: Vitals:   11/05/15 0326 11/05/15 0500 11/05/15 0729 11/05/15 0825  BP:   (!) 115/91   Pulse:   (!) 103   Resp:   (!) 23   Temp:   (!) 103.3 F (39.6 C) 98.9 F (37.2 C)  TempSrc:   Oral Oral  SpO2: 92%  92%   Weight:  89.1 kg (196 lb 8 oz)    Height:        Intake/Output Summary (Last 24 hours) at 11/05/15 1024 Last data filed at 11/05/15 0513  Gross per 24 hour  Intake              560 ml  Output                1 ml  Net              559 ml   Weight change:   Exam:   General:  Pt is alert, follows commands appropriately, not in acute distress  HEENT: No icterus, No thrush, No neck mass, [place]/AT  Cardiovascular: RRR, S1/S2, no rubs, no gallops  Respiratory: Clear to auscultation No wheezing or rales  Abdomen: Soft/+BS, diffusely tender without any rebound, non distended, no guarding  Extremities: trace LE edema, No lymphangitis, No petechiae, No rashes, no synovitis   Data Reviewed: I have personally reviewed following labs and imaging studies Basic Metabolic Panel:  Recent Labs Lab 11/07/2015 2245  11/01/15 0146 11/02/15 0415 11/03/15 0235 11/04/15 0413 11/05/15 0520  NA  --   < > 138 139 136 134* 136  K  --   < > 3.8 3.9 3.4* 3.3* 3.7  CL  --   < > 109 108 104 98* 100*  CO2  --   < > 22 22 24 26 27   GLUCOSE  --   < > 146* 163* 121* 142* 100*  BUN  --   < > 17 14 11 17 17   CREATININE  --   < > 1.15* 1.21* 1.33* 1.53* 1.35*  CALCIUM  --   < > 7.7* 8.4* 8.0* 8.3* 8.2*  MG 1.4*  --  1.6* 2.4  --  1.9  --   < > = values in this interval not displayed. Liver Function Tests:  Recent Labs Lab 11/15/2015 1510 10/31/15 0219 11/03/15 0235 11/05/15 0520  AST 27 24  52* 22  ALT 17 14 21 15   ALKPHOS 62 53 161* 89  BILITOT 1.6* 1.2 0.8 1.1  PROT 6.5 5.2* 5.3* 5.5*  ALBUMIN 3.3* 2.6* 2.5* 2.4*    Recent Labs Lab 11/16/2015 1510  LIPASE 17   No results for input(s): AMMONIA in the last 168 hours. Coagulation Profile:  Recent Labs Lab 10/19/2015 2245  INR 0.98   CBC:  Recent Labs Lab 11/02/2015 1510 10/31/15 0219 11/01/15 0146 11/02/15 0415 11/03/15 0235 11/04/15 0413 11/05/15 0520  WBC 14.7* 12.2* 11.7* 9.6 8.9 8.2 4.3  NEUTROABS 12.8* 11.4*  --   --  8.2*  --  4.0  HGB 9.7* 8.0* 6.8* 9.8* 9.8* 10.0* 9.7*  HCT 29.8* 25.0* 20.8* 29.8* 29.5* 30.5* 28.9*  MCV 91.4 88.7 89.3 86.4 87.0 86.4 86.3  PLT 111* 88* 94* 128* 140* 66* 127*   Cardiac Enzymes:  Recent Labs Lab 10/31/15 1516 10/31/15 2039 11/01/15 0146   TROPONINI 0.09* 0.11* 0.09*   BNP: Invalid input(s): POCBNP CBG:  Recent Labs Lab 11/04/15 0826 11/04/15 1158 11/04/15 1651 11/04/15 2114 11/05/15 0726  GLUCAP 107* 164* 165* 111* 87   HbA1C: No results for input(s): HGBA1C in the last 72 hours. Urine analysis:    Component Value Date/Time   COLORURINE YELLOW 11/03/2015 0457   APPEARANCEUR CLOUDY (A) 11/03/2015 0457   LABSPEC 1.014 11/03/2015 0457   PHURINE 5.5 11/03/2015 0457   GLUCOSEU NEGATIVE 11/03/2015 0457   GLUCOSEU NEGATIVE 04/15/2015 1209   HGBUR NEGATIVE 11/03/2015 0457   BILIRUBINUR NEGATIVE 11/03/2015 0457   KETONESUR NEGATIVE 11/03/2015 0457   PROTEINUR 30 (A) 11/03/2015 0457   UROBILINOGEN 0.2 04/15/2015 1209   NITRITE NEGATIVE 11/03/2015 0457   LEUKOCYTESUR NEGATIVE 11/03/2015 0457   Sepsis Labs: @LABRCNTIP (procalcitonin:4,lacticidven:4) ) Recent Results (from the past 240 hour(s))  C difficile quick screen w PCR reflex     Status: None   Collection Time: 10/31/15 10:00 AM  Result Value Ref Range Status   C Diff antigen NEGATIVE NEGATIVE Final   C Diff toxin NEGATIVE NEGATIVE Final   C Diff interpretation No C. difficile detected.  Final  Culture, blood (routine x 2)     Status: None (Preliminary result)   Collection Time: 11/03/15  2:24 AM  Result Value Ref Range Status   Specimen Description BLOOD LEFT ANTECUBITAL  Final   Special Requests BOTTLES DRAWN AEROBIC AND ANAEROBIC 5CC  Final   Culture NO GROWTH 1 DAY  Final   Report Status PENDING  Incomplete  Culture, blood (routine x 2)     Status: None (Preliminary result)   Collection Time: 11/03/15  2:30 AM  Result Value Ref Range Status   Specimen Description BLOOD LEFT HAND  Final   Special Requests BOTTLES DRAWN AEROBIC AND ANAEROBIC 5CC  Final   Culture NO GROWTH 1 DAY  Final   Report Status PENDING  Incomplete  MRSA PCR Screening     Status: None   Collection Time: 11/03/15  5:09 AM  Result Value Ref Range Status   MRSA by PCR  NEGATIVE NEGATIVE Final    Comment:        The GeneXpert MRSA Assay (FDA approved for NASAL specimens only), is one component of a comprehensive MRSA colonization surveillance program. It is not intended to diagnose MRSA infection nor to guide or monitor treatment for MRSA infections.      Scheduled Meds: . amLODipine  10 mg Oral Daily  . enoxaparin (LOVENOX) injection  40 mg Subcutaneous Q24H  .  famotidine  20 mg Oral BID  . insulin aspart  0-5 Units Subcutaneous QHS  . insulin aspart  0-9 Units Subcutaneous TID WC  . methylPREDNISolone (SOLU-MEDROL) injection  40 mg Intravenous Q24H  . metoprolol tartrate  50 mg Oral BID  . piperacillin-tazobactam (ZOSYN)  IV  3.375 g Intravenous Q8H  . sodium chloride flush  10-40 mL Intracatheter Q12H  . sodium chloride flush  3 mL Intravenous Q12H  . tuberculin  5 Units Intradermal Once   Continuous Infusions:   Procedures/Studies: Dg Chest 2 View  Result Date: 11/04/2015 CLINICAL DATA:  Shortness of breath, weakness, pneumonia EXAM: CHEST  2 VIEW COMPARISON:  CT chest of 11/03/2015 FINDINGS: There has been definite improvement in the patchy airspace disease and nodular opacities scattered throughout the lungs on recent CT most consistent with improving inflammatory or infectious process. Right PICC line is present with the tip within the distal SVC. No pneumothorax is noted. No pleural effusion is seen. Heart size is stable. IMPRESSION: 1. Significant improvement in airspace disease. 2. Right-sided PICC line tip within the lower SVC. Electronically Signed   By: Ivar Drape M.D.   On: 11/04/2015 09:31   Dg Chest 2 View  Result Date: 11/01/2015 CLINICAL DATA:  Pulmonary edema. EXAM: CHEST  2 VIEW COMPARISON:  11/15/2015 FINDINGS: Two views study shows upper normal heart size. Diffuse interstitial opacity suggests edema. No focal airspace consolidation or substantial pleural effusion. The visualized bony structures of the thorax are intact.  Telemetry leads overlie the chest. IMPRESSION: Slight increase in diffuse interstitial opacities suggesting edema. Electronically Signed   By: Misty Stanley M.D.   On: 11/01/2015 08:23   Dg Chest 2 View  Result Date: 10/18/2015 CLINICAL DATA:  Chest pain and coughing for 2 weeks. EXAM: CHEST  2 VIEW COMPARISON:  02/09/2015 FINDINGS: AP and lateral views of the chest show low volumes. The cardio pericardial silhouette is enlarged. There is pulmonary vascular congestion without overt pulmonary edema. The visualized bony structures of the thorax are intact. Telemetry leads overlie the chest. IMPRESSION: Cardiomegaly with pulmonary vascular congestion. Electronically Signed   By: Misty Stanley M.D.   On: 11/11/2015 17:37   Ct Angio Chest Pe W Or Wo Contrast  Result Date: 11/03/2015 CLINICAL DATA:  55 year old female with shortness of breath and tachycardia. EXAM: CT ANGIOGRAPHY CHEST WITH CONTRAST TECHNIQUE: Multidetector CT imaging of the chest was performed using the standard protocol during bolus administration of intravenous contrast. Multiplanar CT image reconstructions and MIPs were obtained to evaluate the vascular anatomy. CONTRAST:  50 cc Isovue 370 COMPARISON:  Chest radiograph dated 11/03/2015. FINDINGS: Cardiovascular: The thoracic aorta appears unremarkable. The origins of the great vessels of the aortic arch appear patent. There is no CT evidence of pulmonary embolism. Mediastinum/Nodes: Right hilar adenopathy. Top-normal left hilar lymph nodes. No significant mediastinal adenopathy. The esophagus is grossly unremarkable. No thyroid nodules identified. Lungs/Pleura: There is diffuse ground-glass airspace opacity throughout the lungs. Confluent nodular consolidative changes bilaterally most compatible with multifocal pneumonia. Correlation with clinical exam and follow-up to resolution recommended. Small left pleural effusion. There is no pneumothorax. The central airways are patent. Upper  Abdomen: Fatty infiltration of the liver. There is thickened appearance of the visualized portion of the splenic flexure compatible with colitis. Musculoskeletal: No chest wall abnormality. No acute or significant osseous findings. Review of the MIP images confirms the above findings. IMPRESSION: No CT evidence of pulmonary embolism. Extensive bilateral ground-glass density and confluent nodular consolidative changes most compatible with  multifocal pneumonia. Clinical correlation and follow-up recommended. Inflammatory changes of the splenic flexure compatible with colitis. Correlation with clinical exam and stool cultures recommended. Electronically Signed   By: Anner Crete M.D.   On: 11/03/2015 05:13   Ct Abdomen Pelvis W Contrast  Result Date: 11/09/2015 CLINICAL DATA:  Crohn's disease. Diffuse abdominal pain. Nausea, vomiting and diarrhea. EXAM: CT ABDOMEN AND PELVIS WITH CONTRAST TECHNIQUE: Multidetector CT imaging of the abdomen and pelvis was performed using the standard protocol following bolus administration of intravenous contrast. CONTRAST:  167mL ISOVUE-300 IOPAMIDOL (ISOVUE-300) INJECTION 61% COMPARISON:  MR enterography 10/27/2015 FINDINGS: Lower chest: No pulmonary nodules. No visible pleural or pericardial effusion. Hepatobiliary: Normal hepatic size and contours without focal liver lesion. No perihepatic ascites. No intra- or extrahepatic biliary dilatation. Normal gallbladder. Pancreas: Normal pancreatic contours and enhancement. No peripancreatic fluid collection or pancreatic ductal dilatation. Spleen: Normal. Adrenals/Urinary Tract: Normal adrenal glands. No hydronephrosis or solid renal mass. Stomach/Bowel: There is long segment bowel wall thickening and surrounding inflammatory stranding extending from the terminal ileum to the junction of the descending and sigmoid colon. There is again seen a suspected fistula between the terminal ileum and an adjacent loop of right lower quadrant  bowel (sagittal image 43, coronal image 51). The appendix is not visualized. No abscess or focal fluid collection. No dilated small bowel. No evidence of focal small bowel inflammation. Vascular/Lymphatic: Normal course and caliber of the major abdominal vessels. No abdominal or pelvic adenopathy. Reproductive: Hyperenhancing focus at the uterine fundus is likely a small fibroid. Ovaries are normal. Musculoskeletal: Multilevel osteophytosis and facet arthrosis. No bony spinal canal stenosis. No lytic or blastic lesions. Normal visualized extrathoracic and extraperitoneal soft tissues. Other: No contributory non-categorized findings. IMPRESSION: 1. Long segment bowel inflammation extending from the terminal ileum to the junction of the descending and sigmoid colon, consistent with active Crohn's disease. No abscess or fluid collection. 2. 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 Electronically Signed   By: Ulyses Jarred M.D.   On: 11/03/2015 17:49   Mr Consuela Mimes W/o W/cm  Result Date: 10/27/2015 CLINICAL DATA:  55 year old female with Crohn disease and abdominal pain. History of appendectomy. EXAM: MR ABDOMEN AND PELVIS WITHOUT AND WITH CONTRAST (MR ENTEROGRAPHY) TECHNIQUE: Multiplanar, multisequence MRI of the abdomen and pelvis was performed both before and during bolus administration of intravenous contrast. Negative oral contrast VoLumen was given. CONTRAST:  64mL MULTIHANCE GADOBENATE DIMEGLUMINE 529 MG/ML IV SOLN COMPARISON:  09/29/2015 CT abdomen/pelvis. FINDINGS: COMBINED FINDINGS FOR BOTH MR ABDOMEN AND PELVIS Many of the sequences are motion degraded. Lower chest: Clear lung bases. Stable mild elevation of the left hemidiaphragm. Hepatobiliary: Normal liver size and configuration. No liver mass. Normal gallbladder with no cholelithiasis. No biliary ductal dilatation. Common bile duct diameter 2 mm. No choledocholithiasis.  Pancreas: No pancreatic mass or duct dilation.  No pancreas divisum. Spleen: Normal size. No mass. Adrenals/Urinary Tract: Normal adrenals. No hydronephrosis. Simple subcentimeter exophytic renal cyst in the far upper right kidney. Otherwise normal kidneys, with no suspicious renal mass. Stomach/Bowel: Normal stomach. Normal caliber small bowel. There is mild wall thickening and mucosal hyperenhancement in the terminal ileum (series 1201/ image 96). There is a closely approximated distal small bowel loop in the right abdomen anterior to the terminal ileum, with a suspected short 1 cm in length fistula between the terminal ileum and this adjacent right abdominal small bowel loop (series 14/ image 85). There is a separate  approximately 5 cm in length loop of small bowel in the left abdomen demonstrating mild mucosal hyperenhancement (series 1201/ image 89). Otherwise no small bowel wall thickening or wall hyperenhancement. No focal small bowel caliber transition. Status post appendectomy. Normal caliber large bowel. No large bowel wall thickening or wall hyperenhancement. Mild descending and proximal sigmoid colonic diverticulosis. No pericolonic or mesenteric edema. Vascular/Lymphatic: Normal caliber abdominal aorta. Patent portal, splenic, hepatic and renal veins. No pathologically enlarged lymph nodes in the abdomen. Reproductive: Anterior upper uterine body subserosal enhancing 1.8 x 1.6 x 1.9 cm fibroid. Unremarkable ovaries. No adnexal masses. Other: No abdominal ascites or focal fluid collection. Musculoskeletal: No aggressive appearing focal osseous lesions. IMPRESSION: 1. Mild active Crohn terminal ileitis. Suspected short 1 cm in length fistula between the terminal ileum and an adjacent right abdominal small bowel loop anterior to the terminal ileum. 2. Possible mild active Crohn enteritis within a separate short proximal left abdominal small bowel segment. 3. No abscess.  No evidence of bowel obstruction.  4. Solitary subserosal uterine fibroid. Electronically Signed   By: Ilona Sorrel M.D.   On: 10/27/2015 18:33   Mr [place] Pelvis W/o W/cm  Result Date: 10/27/2015 CLINICAL DATA:  55 year old female with Crohn disease and abdominal pain. History of appendectomy. EXAM: MR ABDOMEN AND PELVIS WITHOUT AND WITH CONTRAST (MR ENTEROGRAPHY) TECHNIQUE: Multiplanar, multisequence MRI of the abdomen and pelvis was performed both before and during bolus administration of intravenous contrast. Negative oral contrast VoLumen was given. CONTRAST:  22mL MULTIHANCE GADOBENATE DIMEGLUMINE 529 MG/ML IV SOLN COMPARISON:  09/29/2015 CT abdomen/pelvis. FINDINGS: COMBINED FINDINGS FOR BOTH MR ABDOMEN AND PELVIS Many of the sequences are motion degraded. Lower chest: Clear lung bases. Stable mild elevation of the left hemidiaphragm. Hepatobiliary: Normal liver size and configuration. No liver mass. Normal gallbladder with no cholelithiasis. No biliary ductal dilatation. Common bile duct diameter 2 mm. No choledocholithiasis. Pancreas: No pancreatic mass or duct dilation.  No pancreas divisum. Spleen: Normal size. No mass. Adrenals/Urinary Tract: Normal adrenals. No hydronephrosis. Simple subcentimeter exophytic renal cyst in the far upper right kidney. Otherwise normal kidneys, with no suspicious renal mass. Stomach/Bowel: Normal stomach. Normal caliber small bowel. There is mild wall thickening and mucosal hyperenhancement in the terminal ileum (series 1201/ image 96). There is a closely approximated distal small bowel loop in the right abdomen anterior to the terminal ileum, with a suspected short 1 cm in length fistula between the terminal ileum and this adjacent right abdominal small bowel loop (series 14/ image 85). There is a separate approximately 5 cm in length loop of small bowel in the left abdomen demonstrating mild mucosal hyperenhancement (series 1201/ image 89). Otherwise no small bowel wall thickening or wall  hyperenhancement. No focal small bowel caliber transition. Status post appendectomy. Normal caliber large bowel. No large bowel wall thickening or wall hyperenhancement. Mild descending and proximal sigmoid colonic diverticulosis. No pericolonic or mesenteric edema. Vascular/Lymphatic: Normal caliber abdominal aorta. Patent portal, splenic, hepatic and renal veins. No pathologically enlarged lymph nodes in the abdomen. Reproductive: Anterior upper uterine body subserosal enhancing 1.8 x 1.6 x 1.9 cm fibroid. Unremarkable ovaries. No adnexal masses. Other: No abdominal ascites or focal fluid collection. Musculoskeletal: No aggressive appearing focal osseous lesions. IMPRESSION: 1. Mild active Crohn terminal ileitis. Suspected short 1 cm in length fistula between the terminal ileum and an adjacent right abdominal small bowel loop anterior to the terminal ileum. 2. Possible mild active Crohn enteritis within a separate short proximal left abdominal small bowel segment.  3. No abscess.  No evidence of bowel obstruction. 4. Solitary subserosal uterine fibroid. Electronically Signed   By: Ilona Sorrel M.D.   On: 10/27/2015 18:33   Dg Chest Port 1 View  Result Date: 11/03/2015 CLINICAL DATA:  55 year old female with shortness of breath and tachypnea EXAM: PORTABLE CHEST 1 VIEW COMPARISON:  Chest radiograph dated 11/01/2015 FINDINGS: There has been interval placement of a right-sided PICC with tip over central SVC. Bilateral interstitial nodular opacities primarily involving the right mid to lower lung field, new or progressed compared to the prior study and likely represent congestive changes or edema. Pneumonia is not excluded. Clinical correlation is recommended. There is no pleural effusion or pneumothorax. Top-normal cardiac silhouette. No acute osseous pathology. IMPRESSION: Right-sided PICC with tip over central SVC.  No pneumothorax. Interval increase in pulmonary nodular densities primarily involving the right  lung likely related to interval worsening of the congestion or edema. Pneumonia is not excluded. Clinical correlation and follow-up recommended. Electronically Signed   By: Anner Crete M.D.   On: 11/03/2015 02:51    Chipper Oman, MD   [facility] Pager [phone]  If 7PM-7AM, please contact night-coverage [email or web address] Password [email or web address] 11/05/2015, 10:24 AM   LOS: 6 days

## 2015-11-06 LAB — BASIC METABOLIC PANEL
Anion gap: 8 (ref 5–15)
BUN: 18 mg/dL (ref 6–20)
CO2: 25 mmol/L (ref 22–32)
CREATININE: 1.35 mg/dL — AB (ref 0.44–1.00)
Calcium: 7.7 mg/dL — ABNORMAL LOW (ref 8.9–10.3)
Chloride: 101 mmol/L (ref 101–111)
GFR calc Af Amer: 51 mL/min — ABNORMAL LOW (ref >60)
GFR, EST NON AFRICAN AMERICAN: 44 mL/min — AB (ref >60)
GLUCOSE: 81 mg/dL (ref 65–99)
Potassium: 3.1 mmol/L — ABNORMAL LOW (ref 3.5–5.1)
SODIUM: 134 mmol/L — AB (ref 135–145)

## 2015-11-06 LAB — CBC WITH DIFFERENTIAL/PLATELET
Basophils Absolute: 0 10*3/uL (ref 0.0–0.1)
Basophils Relative: 0 %
EOS ABS: 0 10*3/uL (ref 0.0–0.7)
EOS PCT: 0 %
HCT: 28.3 % — ABNORMAL LOW (ref 36.0–46.0)
Hemoglobin: 9.2 g/dL — ABNORMAL LOW (ref 12.0–15.0)
LYMPHS ABS: 0.3 10*3/uL — AB (ref 0.7–4.0)
Lymphocytes Relative: 4 %
MCH: 28.2 pg (ref 26.0–34.0)
MCHC: 32.5 g/dL (ref 30.0–36.0)
MCV: 86.8 fL (ref 78.0–100.0)
MONO ABS: 0.1 10*3/uL (ref 0.1–1.0)
MONOS PCT: 2 %
Neutro Abs: 5.9 10*3/uL (ref 1.7–7.7)
Neutrophils Relative %: 94 %
PLATELETS: 140 10*3/uL — AB (ref 150–400)
RBC: 3.26 MIL/uL — ABNORMAL LOW (ref 3.87–5.11)
RDW: 16.3 % — ABNORMAL HIGH (ref 11.5–15.5)
WBC: 6.3 10*3/uL (ref 4.0–10.5)

## 2015-11-06 LAB — BLOOD GAS, ARTERIAL
ACID-BASE EXCESS: 1 mmol/L (ref 0.0–2.0)
Bicarbonate: 24.4 mmol/L (ref 20.0–28.0)
Drawn by: 246101
FIO2: 1
O2 SAT: 90.6 %
PCO2 ART: 34.3 mmHg (ref 32.0–48.0)
PH ART: 7.465 — AB (ref 7.350–7.450)
PO2 ART: 60.1 mmHg — AB (ref 83.0–108.0)
Patient temperature: 98.6

## 2015-11-06 LAB — GLUCOSE, CAPILLARY
GLUCOSE-CAPILLARY: 80 mg/dL (ref 65–99)
Glucose-Capillary: 138 mg/dL — ABNORMAL HIGH (ref 65–99)
Glucose-Capillary: 153 mg/dL — ABNORMAL HIGH (ref 65–99)
Glucose-Capillary: 179 mg/dL — ABNORMAL HIGH (ref 65–99)
Glucose-Capillary: 191 mg/dL — ABNORMAL HIGH (ref 65–99)

## 2015-11-06 LAB — PROCALCITONIN: Procalcitonin: 1.31 ng/mL

## 2015-11-06 MED ORDER — SULFAMETHOXAZOLE-TRIMETHOPRIM 400-80 MG/5ML IV SOLN
320.0000 mg | Freq: Four times a day (QID) | INTRAVENOUS | Status: DC
  Administered 2015-11-06 – 2015-11-12 (×22): 320 mg via INTRAVENOUS
  Filled 2015-11-06 (×34): qty 20

## 2015-11-06 MED ORDER — METHYLPREDNISOLONE SODIUM SUCC 40 MG IJ SOLR: 30.0000 mg | INTRAMUSCULAR | Status: DC

## 2015-11-06 MED ORDER — METHYLPREDNISOLONE SODIUM SUCC 40 MG IJ SOLR
40.0000 mg | Freq: Two times a day (BID) | INTRAMUSCULAR | Status: DC
  Administered 2015-11-06 – 2015-11-12 (×12): 40 mg via INTRAVENOUS
  Filled 2015-11-06 (×12): qty 1

## 2015-11-06 MED ORDER — METHYLPREDNISOLONE SODIUM SUCC 40 MG IJ SOLR
30.0000 mg | INTRAMUSCULAR | Status: DC
Start: 2015-11-06 — End: 2015-11-06

## 2015-11-06 MED ORDER — DEXTROSE 5 % IV SOLN
320.0000 mg | Freq: Four times a day (QID) | INTRAVENOUS | Status: DC
  Filled 2015-11-06 (×2): qty 20

## 2015-11-06 MED ORDER — SULFAMETHOXAZOLE-TRIMETHOPRIM 400-80 MG/5ML IV SOLN
320.0000 mg | Freq: Three times a day (TID) | INTRAVENOUS | Status: DC
  Filled 2015-11-06 (×2): qty 20

## 2015-11-06 NOTE — Progress Notes (Signed)
Patient saturations down to 73%, upon entering room, patients family had encouraged patient to pull mask down to try and eat. Education given to both patient and family that keeping non-re breather mask on is more important for patient clinically at this present time. All voiced understanding. Patient saturations quickly returned to 99%.

## 2015-11-06 NOTE — Progress Notes (Addendum)
PROGRESS NOTE  Nancy Acevedo [id number] DOB: 1960/11/03 DOA: 10/29/2015 PCP: Binnie Rail, MD  Brief History: 55 y.o.femalewith medical history significant for hypertension, chronic kidney disease stage II, OSA, GERD, and recent diagnosis of Crohn's ileocolitis managed with prednisone who presentedto the [facility] department with 2-3 days of fevers, nausea, vomiting, and nonbloody diarrhea. Clinical picture was unclear until recently when when patient had MR enterography on 10/27/2015 showing likely Crohn's with entero-enterofistula, patient had been on prednisone40 mg daily and isin process of being started onRemicade. Pt has had abd pain x 6 months resulting in colonoscopy on 07/31/15 (Dr. Ardis Hughs). This showed a largeulcer ileocecal valve, abnormal mucosa of the TI region, seven punctate, deep ulcers in the rt colon all about 6 mm across; Path-active inflammation with ulceration, no dysplasia. Abdominal pain worsening in past 2 weeks to point patient "could hardly walk". Along with this the patient has been having at least 10 loose stools per day, but denies seeing any bright red blood or melena. The patient has had some fevers and nausea as well as vomiting within the past few days. On the night of 11/02/2015, the patient developed respiratory distress. CT and her grandmother chest was obtained and was negative for pulmonary embolus but showed bilateral diffuse groundglass opacities. Chest x-ray showed increasing right lower lobe nodular opacity. The patient was started on intravenous Zosyn and moved to the stepdown unit.  Patient has also had sharp substernal CP for past several months. She claims increase dyspnea on exertion walking up her stairs at home along with exertional chest pain for 1-2 months. She has had an extensive cardiac work upin January 2017 when she had exertional chest pain associated with syncope. She underwent heart  catheterization on 02/11/2015 which revealed normal coronaries with EF 55-60%. Typical cardiac rhythm at the time of discharge EF 60-65% with no wall motion and mellitus.  Subjective:  Patient seen and examined at bedside, Patient had respiratory distress last night, patient was not wearing CPAP. Desating to the low 80's. Patient was placed on a NRB, which improved saturation. Trial of nasal canula this AM failed to keep sat above 90%  Assessment/Plan:  Acute respiratory failure with hypoxia - on NRB, but unable to wean this Am, CT angio showed ground glass density and confluent nodular consolidative changes - poss PNA, concern of atypical infection not responding to ABx or diuresis. ? PCP  -secondary to pulmonary edema superimposed PNA ? PCP  -continue zosyn -CCM consulted appreciated recs  -ABG ordered. PO2 60 on 100% NRB -check procalcitonin -wean oxygen for saturation > 92 % -Lasix on hold -Move to ICU  Fever unknown source - patients only symptoms are cough and diarrhea. Xray negative for focal process. Could be related to Crohn's flare. 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 connecting terminal ileum to the small bowel -C diff negative  -Will continue to monitor  -On my evaluation PPD negative  Pulmonary Edema improved -likely due to IVF -wean O2 as tolerated  -Echocardiogram--EF 55-65%, no WMA, trivial MR/TR  RLL infiltrate/Ground glass opacities -?HCAP vs pulmonary edema -procalcitonin  1.19 trending down  -continue empiric zosyn -follow blood culture  Chest  pain/Elevated troponin -demand ischemia in setting of sepsi -some typical and atypical components -Completely reproducible on palpation of sternum -02/11/15 cardiac cath--normal coronaries -personally reviewed EKG--sinus, early repol, nonspecific T-wave changes -CXR-increase interstitial markings--not fluid overloaded clinically -Echocardiogram--EF 55-65%, no WMA, trivial MR/TR  Indeterminant QuanteriFERON PPD was placed so far negative  -checking for latent TB for remicade -NO clinical suspicion for active pulm TB  CKD 2 -Baseline creatinine 1-1.2  Hypertension -Continue amlodipine and metoprolol tartrate -pt endorsed poor compliance with meds at home missing 2-3 days per week  Diabetes mellitus type 2 - stable -10/02/2015 hemoglobin A1c 6.6 -Continue NovoLog sliding scale  GERD -10/22/14--EGD--The esophageal mucosa was abnormal appearing throughout but mainly in the proximal 1/2 of the esophagus. There were numerous linear and irregularly patterned furrows throughout.  Thrombocytopenia on admission 111, continues to improve -likely due to sepsis, but pt with hx of B12 deficiency -check B12--707 -Monitor  Anemia - stable  -transfused 2 units PRBCs 11/01/15 -Monitor CBC  Disposition Plan: Not stable for d/c Family Communication: None at bedside Code Status: FULL DVT Prophylaxis: SCDs  Consultants:[facility]  Procedures: As Listed in Progress Note Above  Antibiotics: cipro 10/13>>>10/16 Flagyl 10/13>>>10/16 Vanco 11/03/15 x 1 Zosyn 11/02/15>>> Vanco 11/05/15 x1   Objective: Vitals:   11/06/15 0600 11/06/15 0628 11/06/15 0736 11/06/15 0800  BP: 126/82  134/78 129/82  Pulse: 95 96 100 93  Resp: (!) 34 (!) 29 (!) 38 (!) 33  Temp:  97.8 F (36.6 C) 98.8 F (37.1 C)   TempSrc:  Axillary Axillary   SpO2: 98% 98% 100% 100%  Weight:      Height:        Intake/Output Summary (Last 24 hours) at 11/06/15 0841 Last data filed at 11/06/15 0622   Gross per 24 hour  Intake              950 ml  Output              650 ml  Net              300 ml   Weight change: -4.309 kg (-9 lb 8 oz) Exam:   General:  Pt is alert, follows commands appropriately, not in acute distress  HEENT: No icterus, No thrush, No neck mass, [place]/AT  Cardiovascular: RRR, S1/S2, no rubs, no gallops  Respiratory: Decrease air entry, Clear to auscultation No wheezing or rales  Abdomen: Soft/+BS, diffusely tender without any rebound, non distended, no guarding  Extremities: trace LE edema, No lymphangitis, No petechiae, No rashes, no synovitis   Data Reviewed: I have personally reviewed following labs and imaging studies Basic Metabolic Panel:  Recent Labs Lab 10/20/2015 2245  11/01/15 0146 11/02/15 0415 11/03/15 0235 11/04/15 0413 11/05/15 0520 11/06/15 0500  NA  --   < > 138 139 136 134* 136 134*  K  --   < > 3.8 3.9 3.4* 3.3* 3.7 3.1*  CL  --   < > 109 108 104 98* 100* 101  CO2  --   < > 22 22 24 26 27 25   GLUCOSE  --   < > 146* 163* 121* 142* 100* 81  BUN  --   < > 17 14 11 17 17 18   CREATININE  --   < > 1.15* 1.21* 1.33* 1.53* 1.35* 1.35*  CALCIUM  --   < > 7.7* 8.4* 8.0* 8.3* 8.2* 7.7*  MG  1.4*  --  1.6* 2.4  --  1.9  --   --   < > = values in this interval not displayed. Liver Function Tests:  Recent Labs Lab 10/20/2015 1510 10/31/15 0219 11/03/15 0235 11/05/15 0520  AST 27 24 52* 22  ALT 17 14 21 15   ALKPHOS 62 53 161* 89  BILITOT 1.6* 1.2 0.8 1.1  PROT 6.5 5.2* 5.3* 5.5*  ALBUMIN 3.3* 2.6* 2.5* 2.4*    Recent Labs Lab 10/21/2015 1510  LIPASE 17   No results for input(s): AMMONIA in the last 168 hours. Coagulation Profile:  Recent Labs Lab 10/23/2015 2245  INR 0.98   CBC:  Recent Labs Lab 10/20/2015 1510 10/31/15 0219  11/02/15 0415 11/03/15 0235 11/04/15 0413 11/05/15 0520 11/06/15 0500  WBC 14.7* 12.2*  < > 9.6 8.9 8.2 4.3 6.3  NEUTROABS 12.8* 11.4*  --   --  8.2*  --  4.0 5.9  HGB 9.7* 8.0*  < > 9.8* 9.8*  10.0* 9.7* 9.2*  HCT 29.8* 25.0*  < > 29.8* 29.5* 30.5* 28.9* 28.3*  MCV 91.4 88.7  < > 86.4 87.0 86.4 86.3 86.8  PLT 111* 88*  < > 128* 140* 66* 127* 140*  < > = values in this interval not displayed. Cardiac Enzymes:  Recent Labs Lab 10/31/15 1516 10/31/15 2039 11/01/15 0146  TROPONINI 0.09* 0.11* 0.09*   BNP: Invalid input(s): POCBNP CBG:  Recent Labs Lab 11/05/15 1233 11/05/15 1513 11/05/15 1654 11/05/15 2154 11/06/15 0734  GLUCAP 149* 175* 158* 102* 80   HbA1C: No results for input(s): HGBA1C in the last 72 hours. Urine analysis:    Component Value Date/Time   COLORURINE YELLOW 11/05/2015 0848   APPEARANCEUR CLEAR 11/05/2015 0848   LABSPEC 1.023 11/05/2015 0848   PHURINE 6.0 11/05/2015 0848   GLUCOSEU NEGATIVE 11/05/2015 0848   GLUCOSEU NEGATIVE 04/15/2015 1209   HGBUR NEGATIVE 11/05/2015 0848   BILIRUBINUR NEGATIVE 11/05/2015 0848   KETONESUR NEGATIVE 11/05/2015 0848   PROTEINUR 100 (A) 11/05/2015 0848   UROBILINOGEN 0.2 04/15/2015 1209   NITRITE NEGATIVE 11/05/2015 0848   LEUKOCYTESUR NEGATIVE 11/05/2015 0848   Sepsis Labs: @LABRCNTIP (procalcitonin:4,lacticidven:4) ) Recent Results (from the past 240 hour(s))  C difficile quick screen w PCR reflex     Status: None   Collection Time: 10/31/15 10:00 AM  Result Value Ref Range Status   C Diff antigen NEGATIVE NEGATIVE Final   C Diff toxin NEGATIVE NEGATIVE Final   C Diff interpretation No C. difficile detected.  Final  Culture, blood (routine x 2)     Status: None (Preliminary result)   Collection Time: 11/03/15  2:24 AM  Result Value Ref Range Status   Specimen Description BLOOD LEFT ANTECUBITAL  Final   Special Requests BOTTLES DRAWN AEROBIC AND ANAEROBIC 5CC  Final   Culture NO GROWTH 2 DAYS  Final   Report Status PENDING  Incomplete  Culture, blood (routine x 2)     Status: None (Preliminary result)   Collection Time: 11/03/15  2:30 AM  Result Value Ref Range Status   Specimen Description  BLOOD LEFT HAND  Final   Special Requests BOTTLES DRAWN AEROBIC AND ANAEROBIC 5CC  Final   Culture NO GROWTH 2 DAYS  Final   Report Status PENDING  Incomplete  MRSA PCR Screening     Status: None   Collection Time: 11/03/15  5:09 AM  Result Value Ref Range Status   MRSA by PCR NEGATIVE NEGATIVE Final    Comment:  The GeneXpert MRSA Assay (FDA approved for NASAL specimens only), is one component of a comprehensive MRSA colonization surveillance program. It is not intended to diagnose MRSA infection nor to guide or monitor treatment for MRSA infections.   Culture, blood (routine x 2)     Status: None (Preliminary result)   Collection Time: 11/05/15  9:58 AM  Result Value Ref Range Status   Specimen Description BLOOD LEFT WRIST  Final   Special Requests BOTTLES DRAWN AEROBIC ONLY  5CC  Final   Culture PENDING  Incomplete   Report Status PENDING  Incomplete  C difficile quick scan w PCR reflex     Status: None   Collection Time: 11/05/15 11:05 AM  Result Value Ref Range Status   C Diff antigen NEGATIVE NEGATIVE Final   C Diff toxin NEGATIVE NEGATIVE Final   C Diff interpretation No C. difficile detected.  Final     Scheduled Meds: . amLODipine  10 mg Oral Daily  . enoxaparin (LOVENOX) injection  40 mg Subcutaneous Q24H  . famotidine  20 mg Oral BID  . insulin aspart  0-5 Units Subcutaneous QHS  . insulin aspart  0-9 Units Subcutaneous TID WC  . methylPREDNISolone (SOLU-MEDROL) injection  40 mg Intravenous Q24H  . metoprolol tartrate  50 mg Oral BID  . piperacillin-tazobactam (ZOSYN)  IV  3.375 g Intravenous Q8H  . sodium chloride flush  10-40 mL Intracatheter Q12H  . sodium chloride flush  3 mL Intravenous Q12H   Continuous Infusions:   Procedures/Studies: Dg Chest 2 View  Result Date: 11/04/2015 CLINICAL DATA:  Shortness of breath, weakness, pneumonia EXAM: CHEST  2 VIEW COMPARISON:  CT chest of 11/03/2015 FINDINGS: There has been definite improvement in the  patchy airspace disease and nodular opacities scattered throughout the lungs on recent CT most consistent with improving inflammatory or infectious process. Right PICC line is present with the tip within the distal SVC. No pneumothorax is noted. No pleural effusion is seen. Heart size is stable. IMPRESSION: 1. Significant improvement in airspace disease. 2. Right-sided PICC line tip within the lower SVC. Electronically Signed   By: Ivar Drape M.D.   On: 11/04/2015 09:31   Dg Chest 2 View  Result Date: 11/01/2015 CLINICAL DATA:  Pulmonary edema. EXAM: CHEST  2 VIEW COMPARISON:  11/05/2015 FINDINGS: Two views study shows upper normal heart size. Diffuse interstitial opacity suggests edema. No focal airspace consolidation or substantial pleural effusion. The visualized bony structures of the thorax are intact. Telemetry leads overlie the chest. IMPRESSION: Slight increase in diffuse interstitial opacities suggesting edema. Electronically Signed   By: Misty Stanley M.D.   On: 11/01/2015 08:23   Dg Chest 2 View  Result Date: 11/14/2015 CLINICAL DATA:  Chest pain and coughing for 2 weeks. EXAM: CHEST  2 VIEW COMPARISON:  02/09/2015 FINDINGS: AP and lateral views of the chest show low volumes. The cardio pericardial silhouette is enlarged. There is pulmonary vascular congestion without overt pulmonary edema. The visualized bony structures of the thorax are intact. Telemetry leads overlie the chest. IMPRESSION: Cardiomegaly with pulmonary vascular congestion. Electronically Signed   By: Misty Stanley M.D.   On: 11/08/2015 17:37   Ct Angio Chest Pe W Or Wo Contrast  Result Date: 11/03/2015 CLINICAL DATA:  55 year old female with shortness of breath and tachycardia. EXAM: CT ANGIOGRAPHY CHEST WITH CONTRAST TECHNIQUE: Multidetector CT imaging of the chest was performed using the standard protocol during bolus administration of intravenous contrast. Multiplanar CT image reconstructions and MIPs were  obtained to  evaluate the vascular anatomy. CONTRAST:  50 cc Isovue 370 COMPARISON:  Chest radiograph dated 11/03/2015. FINDINGS: Cardiovascular: The thoracic aorta appears unremarkable. The origins of the great vessels of the aortic arch appear patent. There is no CT evidence of pulmonary embolism. Mediastinum/Nodes: Right hilar adenopathy. Top-normal left hilar lymph nodes. No significant mediastinal adenopathy. The esophagus is grossly unremarkable. No thyroid nodules identified. Lungs/Pleura: There is diffuse ground-glass airspace opacity throughout the lungs. Confluent nodular consolidative changes bilaterally most compatible with multifocal pneumonia. Correlation with clinical exam and follow-up to resolution recommended. Small left pleural effusion. There is no pneumothorax. The central airways are patent. Upper Abdomen: Fatty infiltration of the liver. There is thickened appearance of the visualized portion of the splenic flexure compatible with colitis. Musculoskeletal: No chest wall abnormality. No acute or significant osseous findings. Review of the MIP images confirms the above findings. IMPRESSION: No CT evidence of pulmonary embolism. Extensive bilateral ground-glass density and confluent nodular consolidative changes most compatible with multifocal pneumonia. Clinical correlation and follow-up recommended. Inflammatory changes of the splenic flexure compatible with colitis. Correlation with clinical exam and stool cultures recommended. Electronically Signed   By: Anner Crete M.D.   On: 11/03/2015 05:13   Ct Abdomen Pelvis W Contrast  Result Date: 11/07/2015 CLINICAL DATA:  Crohn's disease. Diffuse abdominal pain. Nausea, vomiting and diarrhea. EXAM: CT ABDOMEN AND PELVIS WITH CONTRAST TECHNIQUE: Multidetector CT imaging of the abdomen and pelvis was performed using the standard protocol following bolus administration of intravenous contrast. CONTRAST:  110mL ISOVUE-300 IOPAMIDOL (ISOVUE-300) INJECTION  61% COMPARISON:  MR enterography 10/27/2015 FINDINGS: Lower chest: No pulmonary nodules. No visible pleural or pericardial effusion. Hepatobiliary: Normal hepatic size and contours without focal liver lesion. No perihepatic ascites. No intra- or extrahepatic biliary dilatation. Normal gallbladder. Pancreas: Normal pancreatic contours and enhancement. No peripancreatic fluid collection or pancreatic ductal dilatation. Spleen: Normal. Adrenals/Urinary Tract: Normal adrenal glands. No hydronephrosis or solid renal mass. Stomach/Bowel: There is long segment bowel wall thickening and surrounding inflammatory stranding extending from the terminal ileum to the junction of the descending and sigmoid colon. There is again seen a suspected fistula between the terminal ileum and an adjacent loop of right lower quadrant bowel (sagittal image 43, coronal image 51). The appendix is not visualized. No abscess or focal fluid collection. No dilated small bowel. No evidence of focal small bowel inflammation. Vascular/Lymphatic: Normal course and caliber of the major abdominal vessels. No abdominal or pelvic adenopathy. Reproductive: Hyperenhancing focus at the uterine fundus is likely a small fibroid. Ovaries are normal. Musculoskeletal: Multilevel osteophytosis and facet arthrosis. No bony spinal canal stenosis. No lytic or blastic lesions. Normal visualized extrathoracic and extraperitoneal soft tissues. Other: No contributory non-categorized findings. IMPRESSION: 1. Long segment bowel inflammation extending from the terminal ileum to the junction of the descending and sigmoid colon, consistent with active Crohn's disease. No abscess or fluid collection. 2. 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 Electronically Signed   By: Ulyses Jarred M.D.   On: 11/04/2015 17:49   Mr Consuela Mimes W/o W/cm  Result Date: 10/27/2015 CLINICAL DATA:  55 year old female  with Crohn disease and abdominal pain. History of appendectomy. EXAM: MR ABDOMEN AND PELVIS WITHOUT AND WITH CONTRAST (MR ENTEROGRAPHY) TECHNIQUE: Multiplanar, multisequence MRI of the abdomen and pelvis was performed both before and during bolus administration of intravenous contrast. Negative oral contrast VoLumen was given. CONTRAST:  21mL MULTIHANCE GADOBENATE DIMEGLUMINE 529 MG/ML IV  SOLN COMPARISON:  09/29/2015 CT abdomen/pelvis. FINDINGS: COMBINED FINDINGS FOR BOTH MR ABDOMEN AND PELVIS Many of the sequences are motion degraded. Lower chest: Clear lung bases. Stable mild elevation of the left hemidiaphragm. Hepatobiliary: Normal liver size and configuration. No liver mass. Normal gallbladder with no cholelithiasis. No biliary ductal dilatation. Common bile duct diameter 2 mm. No choledocholithiasis. Pancreas: No pancreatic mass or duct dilation.  No pancreas divisum. Spleen: Normal size. No mass. Adrenals/Urinary Tract: Normal adrenals. No hydronephrosis. Simple subcentimeter exophytic renal cyst in the far upper right kidney. Otherwise normal kidneys, with no suspicious renal mass. Stomach/Bowel: Normal stomach. Normal caliber small bowel. There is mild wall thickening and mucosal hyperenhancement in the terminal ileum (series 1201/ image 96). There is a closely approximated distal small bowel loop in the right abdomen anterior to the terminal ileum, with a suspected short 1 cm in length fistula between the terminal ileum and this adjacent right abdominal small bowel loop (series 14/ image 85). There is a separate approximately 5 cm in length loop of small bowel in the left abdomen demonstrating mild mucosal hyperenhancement (series 1201/ image 89). Otherwise no small bowel wall thickening or wall hyperenhancement. No focal small bowel caliber transition. Status post appendectomy. Normal caliber large bowel. No large bowel wall thickening or wall hyperenhancement. Mild descending and proximal sigmoid colonic  diverticulosis. No pericolonic or mesenteric edema. Vascular/Lymphatic: Normal caliber abdominal aorta. Patent portal, splenic, hepatic and renal veins. No pathologically enlarged lymph nodes in the abdomen. Reproductive: Anterior upper uterine body subserosal enhancing 1.8 x 1.6 x 1.9 cm fibroid. Unremarkable ovaries. No adnexal masses. Other: No abdominal ascites or focal fluid collection. Musculoskeletal: No aggressive appearing focal osseous lesions. IMPRESSION: 1. Mild active Crohn terminal ileitis. Suspected short 1 cm in length fistula between the terminal ileum and an adjacent right abdominal small bowel loop anterior to the terminal ileum. 2. Possible mild active Crohn enteritis within a separate short proximal left abdominal small bowel segment. 3. No abscess.  No evidence of bowel obstruction. 4. Solitary subserosal uterine fibroid. Electronically Signed   By: Ilona Sorrel M.D.   On: 10/27/2015 18:33   Mr [place] Pelvis W/o W/cm  Result Date: 10/27/2015 CLINICAL DATA:  55 year old female with Crohn disease and abdominal pain. History of appendectomy. EXAM: MR ABDOMEN AND PELVIS WITHOUT AND WITH CONTRAST (MR ENTEROGRAPHY) TECHNIQUE: Multiplanar, multisequence MRI of the abdomen and pelvis was performed both before and during bolus administration of intravenous contrast. Negative oral contrast VoLumen was given. CONTRAST:  5mL MULTIHANCE GADOBENATE DIMEGLUMINE 529 MG/ML IV SOLN COMPARISON:  09/29/2015 CT abdomen/pelvis. FINDINGS: COMBINED FINDINGS FOR BOTH MR ABDOMEN AND PELVIS Many of the sequences are motion degraded. Lower chest: Clear lung bases. Stable mild elevation of the left hemidiaphragm. Hepatobiliary: Normal liver size and configuration. No liver mass. Normal gallbladder with no cholelithiasis. No biliary ductal dilatation. Common bile duct diameter 2 mm. No choledocholithiasis. Pancreas: No pancreatic mass or duct dilation.  No pancreas divisum. Spleen: Normal size. No mass.  Adrenals/Urinary Tract: Normal adrenals. No hydronephrosis. Simple subcentimeter exophytic renal cyst in the far upper right kidney. Otherwise normal kidneys, with no suspicious renal mass. Stomach/Bowel: Normal stomach. Normal caliber small bowel. There is mild wall thickening and mucosal hyperenhancement in the terminal ileum (series 1201/ image 96). There is a closely approximated distal small bowel loop in the right abdomen anterior to the terminal ileum, with a suspected short 1 cm in length fistula between the terminal ileum and this adjacent right abdominal small bowel loop (series  14/ image 85). There is a separate approximately 5 cm in length loop of small bowel in the left abdomen demonstrating mild mucosal hyperenhancement (series 1201/ image 89). Otherwise no small bowel wall thickening or wall hyperenhancement. No focal small bowel caliber transition. Status post appendectomy. Normal caliber large bowel. No large bowel wall thickening or wall hyperenhancement. Mild descending and proximal sigmoid colonic diverticulosis. No pericolonic or mesenteric edema. Vascular/Lymphatic: Normal caliber abdominal aorta. Patent portal, splenic, hepatic and renal veins. No pathologically enlarged lymph nodes in the abdomen. Reproductive: Anterior upper uterine body subserosal enhancing 1.8 x 1.6 x 1.9 cm fibroid. Unremarkable ovaries. No adnexal masses. Other: No abdominal ascites or focal fluid collection. Musculoskeletal: No aggressive appearing focal osseous lesions. IMPRESSION: 1. Mild active Crohn terminal ileitis. Suspected short 1 cm in length fistula between the terminal ileum and an adjacent right abdominal small bowel loop anterior to the terminal ileum. 2. Possible mild active Crohn enteritis within a separate short proximal left abdominal small bowel segment. 3. No abscess.  No evidence of bowel obstruction. 4. Solitary subserosal uterine fibroid. Electronically Signed   By: Ilona Sorrel M.D.   On:  10/27/2015 18:33   Dg Chest Port 1 View  Result Date: 11/03/2015 CLINICAL DATA:  55 year old female with shortness of breath and tachypnea EXAM: PORTABLE CHEST 1 VIEW COMPARISON:  Chest radiograph dated 11/01/2015 FINDINGS: There has been interval placement of a right-sided PICC with tip over central SVC. Bilateral interstitial nodular opacities primarily involving the right mid to lower lung field, new or progressed compared to the prior study and likely represent congestive changes or edema. Pneumonia is not excluded. Clinical correlation is recommended. There is no pleural effusion or pneumothorax. Top-normal cardiac silhouette. No acute osseous pathology. IMPRESSION: Right-sided PICC with tip over central SVC.  No pneumothorax. Interval increase in pulmonary nodular densities primarily involving the right lung likely related to interval worsening of the congestion or edema. Pneumonia is not excluded. Clinical correlation and follow-up recommended. Electronically Signed   By: Anner Crete M.D.   On: 11/03/2015 02:51    Chipper Oman, MD   [facility] Pager [phone]  If 7PM-7AM, please contact night-coverage [email or web address] Password [email or web address] 11/06/2015, 8:41 AM   LOS: 7 days

## 2015-11-06 NOTE — Progress Notes (Addendum)
Pharmacy Antibiotic Note  Nancy Acevedo is a 55 y.o. female admitted on 10/19/2015 with sepsis/exacerbation of crohns.  Pharmacy has been consulted for Vancomycin/Zosyn dosing. Pt has been on Cipro/Flagyl for several days but was switched to Vancomycin + Zosyn on 10/17 with new fevers and a CT angio with likely multi-focal PNA.   Vancomycin doses were discontinued on 10/17 and the patient was continued on Zosyn. The patient did receive a dose of Vancomycin x 1 per the MD on 10/19. CrCl>20 ml/min. Zosyn doses remain appropriate.   Addendum: Given the worsening hypoxia - CCM was consulted and is transitioning the patient to the ICU. Pharmacy has been consulted to start Bactrim for r/o PCP in the setting of immunosuppression with chronic prednisone use. SCr 1.35, CrCl~50 ml/min.   Plan: 1. Continue Zosyn 3.375g IV every 8 hours (infused over 4 hours) 2. Please re-consult pharmacy if Vancomycin is to be resumed 3. Start Bactrim (~15 mg/kg/day) at a dose of 320 mg TMP component divided every 6 hours 4. Will continue to follow renal function, culture results, LOT, and antibiotic de-escalation plans   Height: 5\' 4"  (162.6 cm) Weight: 187 lb (84.8 kg) IBW/kg (Calculated) : 54.7  Temp (24hrs), Avg:99.8 F (37.7 C), Min:97.8 F (36.6 C), Max:102.9 F (39.4 C)   Recent Labs Lab 10/25/2015 1600 11/15/2015 2245 10/31/15 0219  11/02/15 0415 11/03/15 0235 11/04/15 0413 11/05/15 0520 11/06/15 0500  WBC  --   --  12.2*  < > 9.6 8.9 8.2 4.3 6.3  CREATININE  --   --  1.41*  < > 1.21* 1.33* 1.53* 1.35* 1.35*  LATICACIDVEN 2.66* 1.0 1.3  --   --  1.8  --   --   --   < > = values in this interval not displayed.  Estimated Creatinine Clearance: 50.7 mL/min (by C-G formula based on SCr of 1.35 mg/dL (H)).    Allergies  Allergen Reactions  . Lexapro [Escitalopram] Itching  . Losartan   . Spironolactone     rash  . Clonidine Derivatives Palpitations   Vancomycin 10/17 x 1; 10/19 x 1 Zosyn  10/17>> Cipro 10/13>>10/16 Flagyl 10/13>>10/16 Bactrim 10/20 >>  Microbiology results:  10/14 Cdiff >> negative 10/16 Quantiferon Gold >> indeterminate so PPD placed 10/17 - read as negative 10/17 BCx: ngx1d 10/17 MRSA PCR: negative  Thank you for allowing pharmacy to be a part of this patient's care.  Alycia Rossetti, PharmD, BCPS Clinical Pharmacist Pager: [phone] Clinical phone for 11/06/2015 from 7a-3:30p: [phone] If after 3:30p, please call main pharmacy at: [phone] 11/06/2015 8:49 AM

## 2015-11-06 NOTE — Progress Notes (Signed)
RN, Agapito Games, paged this NP because pt was tachypneic and desatting on 6L O2. VM was placed and pt still only satting in high 80s. Pt placed on NRB and began satting normally as high as 100%. Pt alert, oriented.  Temp up which likely contributed to tachypnea. Discussed with RN. Ordered bipap but pt did not require at this time. Seen by RT. Try to wean O2 as able. Noticed pt refused her CPAP at bedtime which may likely also be a contributing factor to this event.  KJKG, NP [facility]

## 2015-11-06 NOTE — Progress Notes (Signed)
Upon arrival patient SPO2 was 88% on a 55% VM RR in 25. Pt was asleep and resting comfortably. Placed pt on NRB 100%, SPO2 came up 100%. BBS were clear. Pt stated she had been coughing, RN stated she gave her med to help cough. Pt resting comfortably at this time. RT will continue to monitor.

## 2015-11-06 NOTE — Consult Note (Signed)
PULMONARY / CRITICAL CARE MEDICINE   Name: Nancy Acevedo MRN: [id number] DOB: 08/05/1960    ADMISSION DATE:  10/28/2015 CONSULTATION DATE:  10/20  REFERRING MD:  [facility]  CHIEF COMPLAINT: Can't breathe  HISTORY OF PRESENT ILLNESS:   55 yo with PMH of recent dx of Crohn's and reported chest pain, left leg pain and increasing SOB x 3 months. She was admitted 10/13 and treated for gastic infections associated with her Crohn's  disease but now has BASDZ and acute hypoxia and will be moved to ICU and possibly intubated.  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  PAST SURGICAL HISTORY: She  has a past surgical history that includes Tonsillectomy and adenoidectomy (2012); Ectopic pregnancy surgery; Myringotomy with tube placement; Tubal ligation; Tonsillectomy; Appendectomy; Colonoscopy (2016); and Cardiac catheterization (N/A, 02/11/2015).  Allergies  Allergen Reactions  . Lexapro [Escitalopram] Itching  . Losartan   . Spironolactone     rash  . Clonidine Derivatives Palpitations    No current facility-administered medications on file prior to encounter.    Current Outpatient Prescriptions on File Prior to Encounter  Medication Sig  . amLODipine (NORVASC) 10 MG tablet Take 1 tablet (10 mg total) by mouth daily.  Marland Kitchen aspirin (GOODSENSE ASPIRIN) 81 MG chewable tablet Chew 81 mg by mouth.  . Hydrocodone-Acetaminophen 7.5-300 MG TABS Take 1-2 tabs by mouth every 4-6 hours as needed for pain  . meclizine (ANTIVERT) 25 MG tablet Take 0.5-1 tablets (12.5-25 mg total) by mouth 3  (three) times daily as needed for dizziness.  . metoprolol succinate (TOPROL-XL) 100 MG 24 hr tablet Take 1 tablet (100 mg total) by mouth daily. Take with or immediately following a meal.  . ondansetron (ZOFRAN ODT) 4 MG disintegrating tablet Take 1 tablet (4 mg total) by mouth every 8 (eight) hours as needed for nausea or vomiting.  Marland Kitchen oxyCODONE-acetaminophen (PERCOCET) 5-325 MG tablet Take 1-2 tablets by mouth every 6 (six) hours as needed. (Patient taking differently: Take 1-2 tablets by mouth every 6 (six) hours as needed for severe pain. )  . potassium chloride SA (K-DUR,KLOR-CON) 20 MEQ tablet Take 2 tablets (40 mEq total) by mouth daily.    FAMILY HISTORY:  Her indicated that the status of her mother is unknown. She indicated that the status of her sister is unknown. She indicated that the status of her neg hx is unknown.    SOCIAL HISTORY: She  reports that she has never smoked. She has never used smokeless tobacco. She reports that she does not drink alcohol or use drugs.  REVIEW OF SYSTEMS:   10 point review of system taken, please see HPI for positives and negatives.   SUBJECTIVE:  Increased wob  VITAL SIGNS: BP 129/82   Pulse 93   Temp 98.8 F (37.1 C) (Axillary)   Resp (!) 33   Ht 5\' 4"  (1.626 m)   Wt 187 lb (84.8 kg)   SpO2 100%   BMI 32.10 kg/m   HEMODYNAMICS:    VENTILATOR SETTINGS:    INTAKE / OUTPUT: I/O last 3 completed shifts: In: 87 [P.O.:960; IV Piggyback:450] Out: 651 [Urine:650; Stool:1]  PHYSICAL EXAMINATION: General:  WNWDAAF. Resp distress  noted  Neuro:  Intact HEENT:No JVD / LAN Cardiovascular:  HSR RRR Lungs:  Decreased air movement Abdomen:  Tender, soft +bs Musculoskeletal:  intact Skin: warm and dry  LABS:  BMET  Recent Labs Lab 11/04/15 0413 11/05/15 0520 11/06/15 0500  NA 134* 136 134*  K 3.3* 3.7 3.1*  CL 98* 100* 101  CO2 26 27 25   BUN 17 17 18   CREATININE 1.53* 1.35* 1.35*  GLUCOSE 142* 100* 81     Electrolytes  Recent Labs Lab 11/01/15 0146 11/02/15 0415  11/04/15 0413 11/05/15 0520 11/06/15 0500  CALCIUM 7.7* 8.4*  < > 8.3* 8.2* 7.7*  MG 1.6* 2.4  --  1.9  --   --   < > = values in this interval not displayed.  CBC  Recent Labs Lab 11/04/15 0413 11/05/15 0520 11/06/15 0500  WBC 8.2 4.3 6.3  HGB 10.0* 9.7* 9.2*  HCT 30.5* 28.9* 28.3*  PLT 66* 127* 140*    Coag's  Recent Labs Lab 11/12/2015 2245  APTT 33  INR 0.98    Sepsis Markers  Recent Labs Lab 11/08/2015 2245 10/31/15 0219 11/03/15 0235 11/03/15 1351 11/05/15 0520  LATICACIDVEN 1.0 1.3 1.8  --   --   PROCALCITON  --   --   --  2.80 1.19    ABG  Recent Labs Lab 11/03/15 0203  PHART 7.435  PCO2ART 34.9  PO2ART 127*    Liver Enzymes  Recent Labs Lab 10/31/15 0219 11/03/15 0235 11/05/15 0520  AST 24 52* 22  ALT 14 21 15   ALKPHOS 53 161* 89  BILITOT 1.2 0.8 1.1  ALBUMIN 2.6* 2.5* 2.4*    Cardiac Enzymes  Recent Labs Lab 10/31/15 1516 10/31/15 2039 11/01/15 0146  TROPONINI 0.09* 0.11* 0.09*    Glucose  Recent Labs Lab 11/05/15 0726 11/05/15 1233 11/05/15 1513 11/05/15 1654 11/05/15 2154 11/06/15 0734  GLUCAP 87 149* 175* 158* 102* 80    Imaging No results found.   STUDIES:    CULTURES: 10/19 bc x 2>>  ANTIBIOTICS: 10/17 zoysn>>  SIGNIFICANT EVENTS:   LINES/TUBES:   DISCUSSION: 55 yo with PMH of recent dx of Crohn's and reported chest pain, left leg pain and increasing SOB x 3 months. She was admitted 10/13 and treated for gastic infections associated with her Crohn's  disease but now has BASDZ and acute hypoxia and will be moved to ICU and possibly intubated.  ASSESSMENT / PLAN:  PULMONARY A: BASDZ OSA on Cpap per Dr. 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 With ulceration, ? Fistula terminal ileum BASDZ P:   Currently on  Zoysn and vacomycin recently stopped 10/19  ENDOCRINE A:   DM exacerbated by steroids for Crohn's  P:   SSI  NEUROLOGIC A:   Intact P:   RASS goal:0 May need sedation if intubated   FAMILY  - Updates:   - Inter-disciplinary family meet or Palliative Care meeting due by:  10/27    Richardson Landry Minor ACNP Maryanna Shape PCCM Pager [phone] till 3 pm If no answer page [phone] 11/06/2015, 12:06 PM   55 yo female with hx of Crohn's colitis on chronic prednisone therapy presented with fever, nausea, vomiting, diarrhea.  She was started on IV solumedrol and IV Abx.  On 10/17 she developed recurrent fever and respiratory distress with hypoxia.  CT chest showed diffuse, b/l GGO.  Her Abx were changed and she was transferred to SDU.  She has also received lasix.  Her breathing continued to get worse, as well as her oxygen needs.  She is having dry cough.  She feels sore in her chest.  She feels her breathing is labored.  She was not able to tolerated BiPAP.  Awake, follows commands.  Normal strength.  Using accessory muscles.  No sinus tenderness, no stridor.  HR regular and tachycardic.  B/l crackles diffusely.  Abd soft, non tender.  No edema.  No rashes.  Echo 10/16 >> severe LVH, EF 55 to 65%  CT angio chest  10/17 >> diffuse GGO, consolidation b/l  WBC 6.3, Procalcitonin 1.19, Creatinine 1.35.  Discussion: Acute hypoxic respiratory failure with b/l GGO on CT chest with hx of Crohn's colitis on chronic prednisone.  She has not improved with Abx for HCAP or diuresis.  I am concerned she could have atypical infection such as PCP.  Assessment/plan:  Acute hypoxic respiratory failure. - move to ICU - High flow oxygen, prn bipap >> might need intubation - if she is intubated, then will do bronch for cultures >> too risky without airway otherwise - continue zosyn and add bactrim - check HIV - adjust solumedrol  Updated pt's family at bedside.  CC time by me independent of APP time 32 minutes.  Chesley Mires, MD [facility] Pulmonary/Critical Care 11/06/2015, 1:54 PM Pager:  [phone] After 3pm call: [phone]

## 2015-11-06 NOTE — Progress Notes (Signed)
Progress Note   Subjective  Patient with worsening oxygen requirement overnight, tachypnic and short of breath. Reports abdominal pain is actually improving. C Diff tested again and negative.    Objective   Vital signs in last 24 hours: Temp:  [97.8 F (36.6 C)-102.9 F (39.4 C)] 98.8 F (37.1 C) (10/20 0736) Pulse Rate:  [75-103] 93 (10/20 0800) Resp:  [22-38] 33 (10/20 0800) BP: (125-139)/(77-88) 129/82 (10/20 0800) SpO2:  [84 %-100 %] 100 % (10/20 0800) Weight:  [187 lb (84.8 kg)] 187 lb (84.8 kg) (10/20 0421) Last BM Date: 11/05/15 General:    AA female with nonrebreather in place Heart:  Regular rate and rhythm;  Lungs: Respirations even and labored, tachypnic, some coarse BS B  Abdomen:  Soft, nontender and nondistended.  Extremities:  Trace edema. Neurologic:  Alert and oriented,  grossly normal neurologically. Psych:  Cooperative. Normal mood and affect.  Intake/Output from previous day: 10/19 0701 - 10/20 0700 In: 950 [P.O.:600; IV Piggyback:350] Out: 650 [Urine:650] Intake/Output this shift: No intake/output data recorded.  Lab Results:  Recent Labs  11/04/15 0413 11/05/15 0520 11/06/15 0500  WBC 8.2 4.3 6.3  HGB 10.0* 9.7* 9.2*  HCT 30.5* 28.9* 28.3*  PLT 66* 127* 140*   BMET  Recent Labs  11/04/15 0413 11/05/15 0520 11/06/15 0500  NA 134* 136 134*  K 3.3* 3.7 3.1*  CL 98* 100* 101  CO2 26 27 25   GLUCOSE 142* 100* 81  BUN 17 17 18   CREATININE 1.53* 1.35* 1.35*  CALCIUM 8.3* 8.2* 7.7*   LFT  Recent Labs  11/05/15 0520  PROT 5.5*  ALBUMIN 2.4*  AST 22  ALT 15  ALKPHOS 89  BILITOT 1.1  BILIDIR 0.3  IBILI 0.8   PT/INR No results for input(s): LABPROT, INR in the last 72 hours.  Studies/Results: No results found.     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 She had improved somewhat with IV steroids however developed hospital acquired pneumonia.  Hepatitis testing negative, quantiferon indeterminate x2 . C diff negative x 2.   We had planned to start Remicade once TB testing negative, but in light of new pneumonia we cannot proceed or escalate immunosuppresion until this is improved. Further, quan gold was indeterminate x 2, so placed PPD and appears negative today. I reviewed her CT scan with radiology - ileal involvement not as impressive as colonic inflammation from the right colon to transverse colon, which is a change for her (previously ileal disease was worse). We will try to taper down steroids in light of pneumonia. Given mostly colonic inflammation, I think a colonoscopy once respiratory status is improved would be useful, ensure no CMV colitis while on steroids. CMV PCR also checked, if positive this will be helpful but negative serology does not rule out tissue invasive disease. Otherwise, I don't think she is a good surgical candidate right now given the amount of colonic inflammation noted and her ileal disease. Given she is tolerating PO, will allow her to continue to eat and hold off on TPN / bowel rest. Of note, LAEs now normal, previously elevated.  Hopefully with time, her pneumonia can improve to the point where we can treat her with anti-TNF if this is thought to be IBD and infection ruled out. Agree with pulmonary consult, unclear if PCP is a possibility given her steroid use.  Recommend at this time: - pneumonia management  per primary / pulmonary, once respiratory status improved will proceed with colonoscopy - await CMV PCR - taper steroids, will decrease solumedrol to 30mg  - flu shot and IM vaccine if not yet given - solumedrol 40mg  once daily and will need to taper if pneumonia not improving - lovenox for DVT prophylaxis  Call with questions.   [place] Cellar, MD [facility] Pager [phone]

## 2015-11-07 ENCOUNTER — Inpatient Hospital Stay (HOSPITAL_COMMUNITY): Payer: BC Managed Care – PPO

## 2015-11-07 DIAGNOSIS — B20 Human immunodeficiency virus [HIV] disease: Secondary | ICD-10-CM

## 2015-11-07 DIAGNOSIS — Z800 Family history of malignant neoplasm of digestive organs: Secondary | ICD-10-CM

## 2015-11-07 DIAGNOSIS — Z833 Family history of diabetes mellitus: Secondary | ICD-10-CM

## 2015-11-07 DIAGNOSIS — Z8261 Family history of arthritis: Secondary | ICD-10-CM

## 2015-11-07 DIAGNOSIS — R0902 Hypoxemia: Secondary | ICD-10-CM

## 2015-11-07 DIAGNOSIS — Z8249 Family history of ischemic heart disease and other diseases of the circulatory system: Secondary | ICD-10-CM

## 2015-11-07 DIAGNOSIS — Z9989 Dependence on other enabling machines and devices: Secondary | ICD-10-CM

## 2015-11-07 DIAGNOSIS — Z888 Allergy status to other drugs, medicaments and biological substances status: Secondary | ICD-10-CM

## 2015-11-07 DIAGNOSIS — B59 Pneumocystosis: Secondary | ICD-10-CM

## 2015-11-07 LAB — RESPIRATORY PANEL BY PCR
Adenovirus: NOT DETECTED
Bordetella pertussis: NOT DETECTED
CORONAVIRUS OC43-RVPPCR: NOT DETECTED
Chlamydophila pneumoniae: NOT DETECTED
Coronavirus 229E: NOT DETECTED
Coronavirus HKU1: NOT DETECTED
Coronavirus NL63: NOT DETECTED
INFLUENZA A-RVPPCR: NOT DETECTED
INFLUENZA B-RVPPCR: NOT DETECTED
METAPNEUMOVIRUS-RVPPCR: NOT DETECTED
Mycoplasma pneumoniae: NOT DETECTED
PARAINFLUENZA VIRUS 1-RVPPCR: NOT DETECTED
PARAINFLUENZA VIRUS 2-RVPPCR: NOT DETECTED
PARAINFLUENZA VIRUS 4-RVPPCR: NOT DETECTED
Parainfluenza Virus 3: NOT DETECTED
RESPIRATORY SYNCYTIAL VIRUS-RVPPCR: NOT DETECTED
Rhinovirus / Enterovirus: NOT DETECTED

## 2015-11-07 LAB — CBC
HEMATOCRIT: 25.4 % — AB (ref 36.0–46.0)
Hemoglobin: 8.5 g/dL — ABNORMAL LOW (ref 12.0–15.0)
MCH: 28.5 pg (ref 26.0–34.0)
MCHC: 33.5 g/dL (ref 30.0–36.0)
MCV: 85.2 fL (ref 78.0–100.0)
PLATELETS: 129 10*3/uL — AB (ref 150–400)
RBC: 2.98 MIL/uL — ABNORMAL LOW (ref 3.87–5.11)
RDW: 16 % — AB (ref 11.5–15.5)
WBC: 5.3 10*3/uL (ref 4.0–10.5)

## 2015-11-07 LAB — BASIC METABOLIC PANEL
Anion gap: 8 (ref 5–15)
BUN: 18 mg/dL (ref 6–20)
CO2: 25 mmol/L (ref 22–32)
CREATININE: 1.26 mg/dL — AB (ref 0.44–1.00)
Calcium: 7.5 mg/dL — ABNORMAL LOW (ref 8.9–10.3)
Chloride: 99 mmol/L — ABNORMAL LOW (ref 101–111)
GFR calc Af Amer: 55 mL/min — ABNORMAL LOW (ref >60)
GFR, EST NON AFRICAN AMERICAN: 48 mL/min — AB (ref >60)
GLUCOSE: 182 mg/dL — AB (ref 65–99)
POTASSIUM: 3.3 mmol/L — AB (ref 3.5–5.1)
SODIUM: 132 mmol/L — AB (ref 135–145)

## 2015-11-07 LAB — GLUCOSE, CAPILLARY
GLUCOSE-CAPILLARY: 187 mg/dL — AB (ref 65–99)
Glucose-Capillary: 166 mg/dL — ABNORMAL HIGH (ref 65–99)
Glucose-Capillary: 175 mg/dL — ABNORMAL HIGH (ref 65–99)
Glucose-Capillary: 203 mg/dL — ABNORMAL HIGH (ref 65–99)

## 2015-11-07 LAB — PROCALCITONIN: PROCALCITONIN: 1.07 ng/mL

## 2015-11-07 LAB — HIV 1/2 AB DIFFERENTIATION
HIV 1 Ab: POSITIVE — AB
HIV 2 AB: UNDETERMINED

## 2015-11-07 LAB — SEDIMENTATION RATE: SED RATE: 80 mm/h — AB (ref 0–22)

## 2015-11-07 LAB — STREP PNEUMONIAE URINARY ANTIGEN: Strep Pneumo Urinary Antigen: NEGATIVE

## 2015-11-07 LAB — HIV ANTIBODY (ROUTINE TESTING W REFLEX)

## 2015-11-07 MED ORDER — POTASSIUM CHLORIDE 10 MEQ/50ML IV SOLN
10.0000 meq | INTRAVENOUS | Status: AC
Start: 2015-11-07 — End: 2015-11-07
  Administered 2015-11-07 (×4): 10 meq via INTRAVENOUS
  Filled 2015-11-07 (×4): qty 50

## 2015-11-07 MED ORDER — WHITE PETROLATUM GEL
Status: AC
  Filled 2015-11-07: qty 1

## 2015-11-07 MED ORDER — FUROSEMIDE 10 MG/ML IJ SOLN
40.0000 mg | Freq: Two times a day (BID) | INTRAMUSCULAR | Status: DC
  Administered 2015-11-07 (×2): 40 mg via INTRAVENOUS
  Filled 2015-11-07 (×3): qty 4

## 2015-11-07 MED ORDER — SODIUM CHLORIDE 0.9 % IV SOLN: INTRAVENOUS | Status: DC

## 2015-11-07 MED ORDER — POTASSIUM CHLORIDE 20 MEQ PO PACK
40.0000 meq | PACK | Freq: Two times a day (BID) | ORAL | Status: DC
  Administered 2015-11-07 – 2015-11-08 (×3): 40 meq via ORAL
  Filled 2015-11-07 (×7): qty 2

## 2015-11-07 MED ORDER — DOLUTEGRAVIR SODIUM 50 MG PO TABS
50.0000 mg | ORAL_TABLET | Freq: Every day | ORAL | Status: DC
Start: 2015-11-07 — End: 2015-11-16
  Administered 2015-11-07 – 2015-11-14 (×8): 50 mg via ORAL
  Filled 2015-11-07 (×10): qty 1

## 2015-11-07 MED ORDER — EMTRICITABINE-TENOFOVIR AF 200-25 MG PO TABS
1.0000 | ORAL_TABLET | Freq: Every day | ORAL | Status: DC
  Administered 2015-11-07 – 2015-11-14 (×8): 1 via ORAL
  Filled 2015-11-07 (×10): qty 1

## 2015-11-07 NOTE — Progress Notes (Signed)
RT removed patient from BIPAP and placed on partial rebreather. No complications. Vital signs stable at this time. RN made aware. RT will continue to montior.

## 2015-11-07 NOTE — Consult Note (Signed)
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 Brantley Persons   HPI: Nancy Acevedo is an 55 y.o. female with possible history for hypertension chronic kidney disease gastroesophageal reflux disease and recently diagnosed Crohn's colitis with an antero-antero-fistula and flare. She has nausea admitted on the 13th for what was thought to be a flare of Crohn's colitis but then developed dyspnea and hypoxemia with respiratory failure and bilateral infiltrates. She has been moved to the intensive care unit and has been treated with broad-spectrum antibiotics most recently with Zosyn and high-dose IV Bactrim. She is also on Solu-Medrol.  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 I was just alerted to the positive test via the [facility] system via email this afternoon.   I had a long discussion with the patient right now and made her aware of her diagnosis. She was not known to be positive before. She states she's been with one boyfriend recently prior that with the husband but has not been sexually active with her prior husband for probably a decade. She is suspicious that her recent boyfriend is the one who gave her the infection though it certainly could also have been the first husband. She is not aware of prior HIV testing having been done though she did have pregnancy in the past (which in modern days would require HIV testing).     Past Medical History:  Diagnosis Date  . Candidiasis of mouth    History   . Complication of anesthesia    " SLOW WAKING UP "  . 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 Diabetes mellitus, type 2 ([place]) dx 01/2011   borderline - no current medications, diet  controlled  . EP (ectopic pregnancy)   . GERD (gastroesophageal reflux disease)    no meds, diet controlled  . Gout    hx - no current problem  . Headaches, cluster    otc med prn  . History of hyperglycemia 1993  . Hypertension   . OSA (obstructive sleep apnea)    uses CPAP nightly  . Sleep apnea    Uses CPAP every night  . Syncope 02/09/15  . Syncope 02/10/2015    Past Surgical History:  Procedure Laterality Date  . APPENDECTOMY    . CARDIAC CATHETERIZATION N/A 02/11/2015   Procedure: Left Heart Cath and Coronary Angiography;  Surgeon: Troy Sine, MD;  Location: [facility];  Service: Cardiovascular;  Laterality: N/A;- Normal results  . COLONOSCOPY  2016   Ardis Hughs - Normal  . ECTOPIC PREGNANCY SURGERY     left fallopian tube  . MYRINGOTOMY WITH TUBE PLACEMENT    . TONSILLECTOMY    . TONSILLECTOMY AND ADENOIDECTOMY  2012  . TUBAL LIGATION      Social History:  reports that she has never smoked. She has never used smokeless tobacco. She reports that she does not drink alcohol or use drugs.   Family History  Problem Relation Age of Onset  . Arthritis Sister   . Diabetes Mother   . Hypertension Mother   . Colon cancer Neg Hx   . Rectal cancer Neg Hx   . Stomach cancer Neg Hx   .  Esophageal cancer Neg Hx     Allergies  Allergen Reactions  . Lexapro [Escitalopram] Itching  . Losartan   . Spironolactone     rash  . Clonidine Derivatives Palpitations     Medications: I have reviewed patients current medications as documented in Epic Anti-infectives    Start     Dose/Rate Route Frequency Ordered Stop   11/06/15 1400  sulfamethoxazole-trimethoprim (BACTRIM) 320 mg of trimethoprim in dextrose 5 % 500 mL IVPB  Status:  Discontinued     320 mg of trimethoprim 346.7 mL/hr over 90 Minutes Intravenous Every 6 hours 11/06/15 1305 11/06/15 1310   11/06/15 1400  sulfamethoxazole-trimethoprim (BACTRIM) 320 mg of trimethoprim in dextrose 5 % 500 mL IVPB  Status:   Discontinued     320 mg of trimethoprim 346.7 mL/hr over 90 Minutes Intravenous Every 8 hours 11/06/15 1310 11/06/15 1312   11/06/15 1400  sulfamethoxazole-trimethoprim (BACTRIM) 320 mg of trimethoprim in dextrose 5 % 500 mL IVPB     320 mg of trimethoprim 346.7 mL/hr over 90 Minutes Intravenous Every 6 hours 11/06/15 1312     11/05/15 1530  vancomycin (VANCOCIN) IVPB 1000 mg/200 mL premix     1,000 mg 200 mL/hr over 60 Minutes Intravenous  Once 11/05/15 1456 11/05/15 1710   11/03/15 0600  vancomycin (VANCOCIN) IVPB 750 mg/150 ml premix  Status:  Discontinued     750 mg 150 mL/hr over 60 Minutes Intravenous Every 12 hours 11/03/15 0550 11/03/15 1350   11/03/15 0600  piperacillin-tazobactam (ZOSYN) IVPB 3.375 g     3.375 g 12.5 mL/hr over 240 Minutes Intravenous Every 8 hours 11/03/15 0550     11/02/15 2000  ciprofloxacin (CIPRO) tablet 500 mg  Status:  Discontinued     500 mg Oral 2 times daily 11/02/15 0936 11/03/15 0536   11/02/15 1400  metroNIDAZOLE (FLAGYL) tablet 500 mg  Status:  Discontinued     500 mg Oral Every 8 hours 11/02/15 0936 11/03/15 0536   10/31/15 0800  ciprofloxacin (CIPRO) IVPB 400 mg  Status:  Discontinued     400 mg 200 mL/hr over 60 Minutes Intravenous Every 12 hours 11/01/2015 2308 11/02/15 0936   10/31/15 0300  metroNIDAZOLE (FLAGYL) IVPB 500 mg  Status:  Discontinued     500 mg 100 mL/hr over 60 Minutes Intravenous Every 8 hours 11/14/2015 2239 11/02/15 0936   10/24/2015 1915  ciprofloxacin (CIPRO) IVPB 400 mg     400 mg 200 mL/hr over 60 Minutes Intravenous  Once 11/08/2015 1909 11/07/2015 2054   11/08/2015 1915  metroNIDAZOLE (FLAGYL) IVPB 500 mg     500 mg 100 mL/hr over 60 Minutes Intravenous  Once 10/31/2015 1909 11/02/2015 2047         ROS:  as in HPI otherwise remainder of 12 point Review of Systems is negative    Blood pressure 104/74, pulse 73, temperature 97.9 F (36.6 C), temperature source Oral, resp. rate 19, height '5\' 4"'$  (1.626 m), weight 194 lb 14.2  oz (88.4 kg), SpO2 98 %. General: Alert and awake, oriented x3, Wearing nonrebreather  HEENT: anicteric sclera,  EOMI, oropharynx clear and without exudate Cardiovascular: egular rate, normal r,  no murmur rubs or gallops Pulmonary:  fairly clear to auscultationanteriorly Gastrointestinal l:  obese soft nontender, nondistended, normal bowel sounds, Musculoskeletal: no  clubbing or edema noted bilaterally Skin, soft tissue: no rashes Neuro: nonfocal, strength and sensation intact   Results for orders placed or performed during the hospital encounter of 11/01/2015 (from the  past 48 hour(s))  Glucose, capillary     Status: Abnormal   Collection Time: 11/05/15  4:54 PM  Result Value Ref Range   Glucose-Capillary 158 (H) 65 - 99 mg/dL  Glucose, capillary     Status: Abnormal   Collection Time: 11/05/15  9:54 PM  Result Value Ref Range   Glucose-Capillary 102 (H) 65 - 99 mg/dL  CBC with Differential/Platelet     Status: Abnormal   Collection Time: 11/06/15  5:00 AM  Result Value Ref Range   WBC 6.3 4.0 - 10.5 K/uL   RBC 3.26 (L) 3.87 - 5.11 MIL/uL   Hemoglobin 9.2 (L) 12.0 - 15.0 g/dL   HCT 28.3 (L) 36.0 - 46.0 %   MCV 86.8 78.0 - 100.0 fL   MCH 28.2 26.0 - 34.0 pg   MCHC 32.5 30.0 - 36.0 g/dL   RDW 16.3 (H) 11.5 - 15.5 %   Platelets 140 (L) 150 - 400 K/uL   Neutrophils Relative % 94 %   Neutro Abs 5.9 1.7 - 7.7 K/uL   Lymphocytes Relative 4 %   Lymphs Abs 0.3 (L) 0.7 - 4.0 K/uL   Monocytes Relative 2 %   Monocytes Absolute 0.1 0.1 - 1.0 K/uL   Eosinophils Relative 0 %   Eosinophils Absolute 0.0 0.0 - 0.7 K/uL   Basophils Relative 0 %   Basophils Absolute 0.0 0.0 - 0.1 K/uL  Basic metabolic panel     Status: Abnormal   Collection Time: 11/06/15  5:00 AM  Result Value Ref Range   Sodium 134 (L) 135 - 145 mmol/L   Potassium 3.1 (L) 3.5 - 5.1 mmol/L   Chloride 101 101 - 111 mmol/L   CO2 25 22 - 32 mmol/L   Glucose, Bld 81 65 - 99 mg/dL   BUN 18 6 - 20 mg/dL   Creatinine, Ser  1.35 (H) 0.44 - 1.00 mg/dL   Calcium 7.7 (L) 8.9 - 10.3 mg/dL   GFR calc non Af Amer 44 (L) >60 mL/min   GFR calc Af Amer 51 (L) >60 mL/min    Comment: (NOTE) The eGFR has been calculated using the CKD EPI equation. This calculation has not been validated in all clinical situations. eGFR's persistently <60 mL/min signify possible Chronic Kidney Disease.    Anion gap 8 5 - 15  Glucose, capillary     Status: None   Collection Time: 11/06/15  7:34 AM  Result Value Ref Range   Glucose-Capillary 80 65 - 99 mg/dL  Glucose, capillary     Status: Abnormal   Collection Time: 11/06/15 12:03 PM  Result Value Ref Range   Glucose-Capillary 138 (H) 65 - 99 mg/dL  Blood gas, arterial     Status: Abnormal   Collection Time: 11/06/15 12:35 PM  Result Value Ref Range   FIO2 1.00    Delivery systems NON-REBREATHER OXYGEN MASK    pH, Arterial 7.465 (H) 7.350 - 7.450   pCO2 arterial 34.3 32.0 - 48.0 mmHg   pO2, Arterial 60.1 (L) 83.0 - 108.0 mmHg   Bicarbonate 24.4 20.0 - 28.0 mmol/L   Acid-Base Excess 1.0 0.0 - 2.0 mmol/L   O2 Saturation 90.6 %   Patient temperature 98.6    Collection site BRACHIAL ARTERY    Drawn by [id number]    Sample type ARTERIAL DRAW   HIV antibody     Status: Abnormal   Collection Time: 11/06/15 12:39 PM  Result Value Ref Range   HIV Screen 4th Generation wRfx  Comment (A) Non Reactive    Comment: (NOTE) Reactive See additional algorithm testing elsewhere in this report. Performed At: [facility] [place], [place] [id number] Lindon Romp MD [id number]   HIV 1/2 Ab Differentiation     Status: Abnormal   Collection Time: 11/06/15 12:39 PM  Result Value Ref Range   HIV 1 AB Positive (A) Negative   HIV 2 AB Indeterminate Negative   Note Comment     Comment: (NOTE) Positive for HIV-1 antibodies. Laboratory evidence consistent with established HIV-1 infection is present. Performed At: [facility] [place], [place]  [id number] Lindon Romp MD [id number]   Glucose, capillary     Status: Abnormal   Collection Time: 11/06/15  4:09 PM  Result Value Ref Range   Glucose-Capillary 153 (H) 65 - 99 mg/dL  Procalcitonin - Baseline     Status: None   Collection Time: 11/06/15  4:13 PM  Result Value Ref Range   Procalcitonin 1.31 ng/mL    Comment:        Interpretation: PCT > 0.5 ng/mL and <= 2 ng/mL: Systemic infection (sepsis) is possible, but other conditions are known to elevate PCT as well. (NOTE)         ICU PCT Algorithm               Non ICU PCT Algorithm    ----------------------------     ------------------------------         PCT < 0.25 ng/mL                 PCT < 0.1 ng/mL     Stopping of antibiotics            Stopping of antibiotics       strongly encouraged.               strongly encouraged.    ----------------------------     ------------------------------       PCT level decrease by               PCT < 0.25 ng/mL       >= 80% from peak PCT       OR PCT 0.25 - 0.5 ng/mL          Stopping of antibiotics                                             encouraged.     Stopping of antibiotics           encouraged.    ----------------------------     ------------------------------       PCT level decrease by              PCT >= 0.25 ng/mL       < 80% from peak PCT        AND PCT >= 0.5 ng/mL             Continuing antibiotics                                              encouraged.       Continuing antibiotics            encouraged.    ----------------------------     ------------------------------  PCT level increase compared          PCT > 0.5 ng/mL         with peak PCT AND          PCT >= 0.5 ng/mL             Escalation of antibiotics                                          strongly encouraged.      Escalation of antibiotics        strongly encouraged.   Glucose, capillary     Status: Abnormal   Collection Time: 11/06/15  5:16 PM  Result Value Ref Range    Glucose-Capillary 179 (H) 65 - 99 mg/dL  Glucose, capillary     Status: Abnormal   Collection Time: 11/06/15  9:36 PM  Result Value Ref Range   Glucose-Capillary 191 (H) 65 - 99 mg/dL   Comment 1 Notify RN    Comment 2 Document in Chart   Procalcitonin     Status: None   Collection Time: 11/07/15  3:31 AM  Result Value Ref Range   Procalcitonin 1.07 ng/mL    Comment:        Interpretation: PCT > 0.5 ng/mL and <= 2 ng/mL: Systemic infection (sepsis) is possible, but other conditions are known to elevate PCT as well. (NOTE)         ICU PCT Algorithm               Non ICU PCT Algorithm    ----------------------------     ------------------------------         PCT < 0.25 ng/mL                 PCT < 0.1 ng/mL     Stopping of antibiotics            Stopping of antibiotics       strongly encouraged.               strongly encouraged.    ----------------------------     ------------------------------       PCT level decrease by               PCT < 0.25 ng/mL       >= 80% from peak PCT       OR PCT 0.25 - 0.5 ng/mL          Stopping of antibiotics                                             encouraged.     Stopping of antibiotics           encouraged.    ----------------------------     ------------------------------       PCT level decrease by              PCT >= 0.25 ng/mL       < 80% from peak PCT        AND PCT >= 0.5 ng/mL             Continuing antibiotics  encouraged.       Continuing antibiotics            encouraged.    ----------------------------     ------------------------------     PCT level increase compared          PCT > 0.5 ng/mL         with peak PCT AND          PCT >= 0.5 ng/mL             Escalation of antibiotics                                          strongly encouraged.      Escalation of antibiotics        strongly encouraged.   Basic metabolic panel     Status: Abnormal   Collection Time: 11/07/15  3:31 AM    Result Value Ref Range   Sodium 132 (L) 135 - 145 mmol/L   Potassium 3.3 (L) 3.5 - 5.1 mmol/L   Chloride 99 (L) 101 - 111 mmol/L   CO2 25 22 - 32 mmol/L   Glucose, Bld 182 (H) 65 - 99 mg/dL   BUN 18 6 - 20 mg/dL   Creatinine, Ser 1.26 (H) 0.44 - 1.00 mg/dL   Calcium 7.5 (L) 8.9 - 10.3 mg/dL   GFR calc non Af Amer 48 (L) >60 mL/min   GFR calc Af Amer 55 (L) >60 mL/min    Comment: (NOTE) The eGFR has been calculated using the CKD EPI equation. This calculation has not been validated in all clinical situations. eGFR's persistently <60 mL/min signify possible Chronic Kidney Disease.    Anion gap 8 5 - 15  CBC     Status: Abnormal   Collection Time: 11/07/15  3:31 AM  Result Value Ref Range   WBC 5.3 4.0 - 10.5 K/uL   RBC 2.98 (L) 3.87 - 5.11 MIL/uL   Hemoglobin 8.5 (L) 12.0 - 15.0 g/dL   HCT 25.4 (L) 36.0 - 46.0 %   MCV 85.2 78.0 - 100.0 fL   MCH 28.5 26.0 - 34.0 pg   MCHC 33.5 30.0 - 36.0 g/dL   RDW 16.0 (H) 11.5 - 15.5 %   Platelets 129 (L) 150 - 400 K/uL  Glucose, capillary     Status: Abnormal   Collection Time: 11/07/15  7:42 AM  Result Value Ref Range   Glucose-Capillary 175 (H) 65 - 99 mg/dL   Comment 1 Notify RN    Comment 2 Document in Chart   Sedimentation rate     Status: Abnormal   Collection Time: 11/07/15  8:00 AM  Result Value Ref Range   Sed Rate 80 (H) 0 - 22 mm/hr  Respiratory Panel by PCR     Status: None   Collection Time: 11/07/15 11:47 AM  Result Value Ref Range   Adenovirus NOT DETECTED NOT DETECTED   Coronavirus 229E NOT DETECTED NOT DETECTED   Coronavirus HKU1 NOT DETECTED NOT DETECTED   Coronavirus NL63 NOT DETECTED NOT DETECTED   Coronavirus OC43 NOT DETECTED NOT DETECTED   Metapneumovirus NOT DETECTED NOT DETECTED   Rhinovirus / Enterovirus NOT DETECTED NOT DETECTED   Influenza A NOT DETECTED NOT DETECTED   Influenza B NOT DETECTED NOT DETECTED   Parainfluenza Virus 1 NOT DETECTED NOT DETECTED   Parainfluenza Virus 2 NOT DETECTED NOT  DETECTED   Parainfluenza Virus 3 NOT DETECTED NOT DETECTED   Parainfluenza Virus 4 NOT DETECTED NOT DETECTED   Respiratory Syncytial Virus NOT DETECTED NOT DETECTED   Bordetella pertussis NOT DETECTED NOT DETECTED   Chlamydophila pneumoniae NOT DETECTED NOT DETECTED   Mycoplasma pneumoniae NOT DETECTED NOT DETECTED  Glucose, capillary     Status: Abnormal   Collection Time: 11/07/15 11:56 AM  Result Value Ref Range   Glucose-Capillary 187 (H) 65 - 99 mg/dL   Comment 1 Notify RN    Comment 2 Document in Chart   Strep pneumoniae urinary antigen     Status: None   Collection Time: 11/07/15 12:50 PM  Result Value Ref Range   Strep Pneumo Urinary Antigen NEGATIVE NEGATIVE    Comment:        Infection due to S. pneumoniae cannot be absolutely ruled out since the antigen present may be below the detection limit of the test.    '@BRIEFLABTABLE'$ (sdes,specrequest,cult,reptstatus)   ) Recent Results (from the past 720 hour(s))  C difficile quick screen w PCR reflex     Status: None   Collection Time: 10/31/15 10:00 AM  Result Value Ref Range Status   C Diff antigen NEGATIVE NEGATIVE Final   C Diff toxin NEGATIVE NEGATIVE Final   C Diff interpretation No C. difficile detected.  Final  Culture, blood (routine x 2)     Status: None (Preliminary result)   Collection Time: 11/03/15  2:24 AM  Result Value Ref Range Status   Specimen Description BLOOD LEFT ANTECUBITAL  Final   Special Requests BOTTLES DRAWN AEROBIC AND ANAEROBIC 5CC  Final   Culture NO GROWTH 4 DAYS  Final   Report Status PENDING  Incomplete  Culture, blood (routine x 2)     Status: None (Preliminary result)   Collection Time: 11/03/15  2:30 AM  Result Value Ref Range Status   Specimen Description BLOOD LEFT HAND  Final   Special Requests BOTTLES DRAWN AEROBIC AND ANAEROBIC 5CC  Final   Culture NO GROWTH 4 DAYS  Final   Report Status PENDING  Incomplete  MRSA PCR Screening     Status: None   Collection Time: 11/03/15   5:09 AM  Result Value Ref Range Status   MRSA by PCR NEGATIVE NEGATIVE Final    Comment:        The GeneXpert MRSA Assay (FDA approved for NASAL specimens only), is one component of a comprehensive MRSA colonization surveillance program. It is not intended to diagnose MRSA infection nor to guide or monitor treatment for MRSA infections.   Culture, blood (routine x 2)     Status: None (Preliminary result)   Collection Time: 11/05/15  9:50 AM  Result Value Ref Range Status   Specimen Description BLOOD LEFT HAND  Final   Special Requests BOTTLES DRAWN AEROBIC ONLY  5CC  Final   Culture NO GROWTH 2 DAYS  Final   Report Status PENDING  Incomplete  Culture, blood (routine x 2)     Status: None (Preliminary result)   Collection Time: 11/05/15  9:58 AM  Result Value Ref Range Status   Specimen Description BLOOD LEFT WRIST  Final   Special Requests BOTTLES DRAWN AEROBIC ONLY  5CC  Final   Culture NO GROWTH 2 DAYS  Final   Report Status PENDING  Incomplete  C difficile quick scan w PCR reflex     Status: None   Collection Time: 11/05/15 11:05 AM  Result Value Ref Range Status  C Diff antigen NEGATIVE NEGATIVE Final   C Diff toxin NEGATIVE NEGATIVE Final   C Diff interpretation No C. difficile detected.  Final  Respiratory Panel by PCR     Status: None   Collection Time: 11/07/15 11:47 AM  Result Value Ref Range Status   Adenovirus NOT DETECTED NOT DETECTED Final   Coronavirus 229E NOT DETECTED NOT DETECTED Final   Coronavirus HKU1 NOT DETECTED NOT DETECTED Final   Coronavirus NL63 NOT DETECTED NOT DETECTED Final   Coronavirus OC43 NOT DETECTED NOT DETECTED Final   Metapneumovirus NOT DETECTED NOT DETECTED Final   Rhinovirus / Enterovirus NOT DETECTED NOT DETECTED Final   Influenza A NOT DETECTED NOT DETECTED Final   Influenza B NOT DETECTED NOT DETECTED Final   Parainfluenza Virus 1 NOT DETECTED NOT DETECTED Final   Parainfluenza Virus 2 NOT DETECTED NOT DETECTED Final    Parainfluenza Virus 3 NOT DETECTED NOT DETECTED Final   Parainfluenza Virus 4 NOT DETECTED NOT DETECTED Final   Respiratory Syncytial Virus NOT DETECTED NOT DETECTED Final   Bordetella pertussis NOT DETECTED NOT DETECTED Final   Chlamydophila pneumoniae NOT DETECTED NOT DETECTED Final   Mycoplasma pneumoniae NOT DETECTED NOT DETECTED Final     Impression/Recommendation  Principal Problem:   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  #3 Crohn's disease: Is this a firm diagnosis? Certainly the fistulas make it something to think about but I wonder if her HIV could've also  played a role in her gastrointestinal pathology?  I CERTAINLY WOULD NOT USE REMICAID IN near future until we can restore her immune function from HIV standpoint  I spent greater than 80  minutes with the patient including greater than 50% of time in face to face counsel of the patient re her new diagnosis of HIV, AIDS, ARV regimen, PCP PNA treatment and in coordination of her care.       11/07/2015, 3:44 PM   Thank you so much for this interesting consult  [facility] [place] [phone] (pager) [phone] (office) 11/07/2015, 3:44 PM  Alcide Evener 11/07/2015, 3:44 PM

## 2015-11-07 NOTE — Progress Notes (Signed)
Progress Note   Subjective  Patient transferred to ICU for respiratory failure yesterday, severe pneumonia. On and off BiPAP but has not required intubation yet. She endorses her abdominal pain seems a bit better today and not having as much diarrhea.    Objective   Vital signs in last 24 hours: Temp:  [97.8 F (36.6 C)-98.9 F (37.2 C)] 97.9 F (36.6 C) (10/21 1157) Pulse Rate:  [67-88] 79 (10/21 0800) Resp:  [21-38] 30 (10/21 0800) BP: (116-141)/(71-87) 130/81 (10/21 0800) SpO2:  [90 %-100 %] 98 % (10/21 0800) FiO2 (%):  [40 %-100 %] 40 % (10/21 0928) Weight:  [194 lb 14.2 oz (88.4 kg)] 194 lb 14.2 oz (88.4 kg) (10/21 0331) Last BM Date: 11/06/15 General:    AA female, nonrebreather in place Heart:  Regular rate and rhythm; Lungs: coarse BS B Abdomen:  Soft, mild tenderness to palpation Neurologic:  Alert and oriented,  grossly normal neurologically. Psych:  Cooperative. Normal mood and affect.  Intake/Output from previous day: 10/20 0701 - 10/21 0700 In: [id number] [IV Piggyback:1650] Out: 700 [Urine:700] Intake/Output this shift: No intake/output data recorded.  Lab Results:  Recent Labs  11/05/15 0520 11/06/15 0500 11/07/15 0331  WBC 4.3 6.3 5.3  HGB 9.7* 9.2* 8.5*  HCT 28.9* 28.3* 25.4*  PLT 127* 140* 129*   BMET  Recent Labs  11/05/15 0520 11/06/15 0500 11/07/15 0331  NA 136 134* 132*  K 3.7 3.1* 3.3*  CL 100* 101 99*  CO2 27 25 25   GLUCOSE 100* 81 182*  BUN 17 18 18   CREATININE 1.35* 1.35* 1.26*  CALCIUM 8.2* 7.7* 7.5*   LFT  Recent Labs  11/05/15 0520  PROT 5.5*  ALBUMIN 2.4*  AST 22  ALT 15  ALKPHOS 89  BILITOT 1.1  BILIDIR 0.3  IBILI 0.8   PT/INR No results for input(s): LABPROT, INR in the last 72 hours.  Studies/Results: Dg Chest Port 1 View  Result Date: 11/07/2015 CLINICAL DATA:  Followup for acute respiratory failure and hypoxemia. EXAM: PORTABLE CHEST 1 VIEW COMPARISON:  11/04/2015 and older studies. FINDINGS:  Bilateral hazy airspace opacities have increased when compared to the prior exam. No convincing pleural effusion. No pneumothorax. Right PICC is stable with its tip in the lower superior vena cava. IMPRESSION: 1. Worsened lung aeration with increased bilateral airspace opacities consistent with bilateral infection and/or ARDS. Electronically Signed   By: Lajean Manes M.D.   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 She had improved somewhat with IV steroids however developed hospital acquired pneumonia, worsening recently leading to ICU transfer. Hepatitis testing negative, quantiferon indeterminate x2, PPD negative. C diff negative x 2.   We had planned to start Remicade once TB testing negative, but in light of severe pneumonia we cannot proceed or escalate immunosuppresion until this is improved. I reviewed her CT scan with radiology - ileal involvement not as impressive as colonic inflammation from the right colon to transverse colon, which is a change for her (previously ileal disease was worse). Given mostly colonic inflammation, I think a colonoscopy once respiratory status is improved would be useful, ensure no CMV colitis while on steroids. CMV PCR also checked, if positive this will be helpful but negative serology does not rule out tissue invasive disease. Otherwise, I don't think she is a good surgical candidate right now given  the amount of colonic inflammation noted and her ileal disease. Given she is tolerating PO, will allow her to continue to eat and hold off on TPN / bowel rest.   Hopefully with time, her pneumonia can improve to the point where we can treat her with anti-TNF if this is thought to be IBD and infection ruled out. Appreciate critical care assistance, unclear if PCP is a possibility given her steroid use.  Recommend at this time: - pneumonia management per  critical care - defer solumedrol dosing to critical care, but don't think she needs high dose for Crohns right now  - await CMV PCR - lovenox for DVT prophylaxis - consideration for colonoscopy once respiratory status improves  Call with questions.   [place] Cellar, MD [facility] Gastroenterology Pager [phone]

## 2015-11-07 NOTE — Consult Note (Signed)
PULMONARY / CRITICAL CARE MEDICINE   Name: Nancy Acevedo MRN: [id number] DOB: 1960-04-09    ADMISSION DATE:  11/03/2015 CONSULTATION DATE:  10/20  REFERRING MD:  [facility]  CHIEF COMPLAINT: Can't breathe  briuef 55 yo with PMH of recent dx of Crohn's and reported chest pain, left leg pain and increasing SOB x 3 months. She was admitted 10/13 and treated for gastic infections associated with her Crohn's  disease but now has BASDZ and acute hypoxia and will be moved to ICU and possibly intubated.  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  PAST SURGICAL HISTORY: She  has a past surgical history that includes Tonsillectomy and adenoidectomy (2012); Ectopic pregnancy surgery; Myringotomy with tube placement; Tubal ligation; Tonsillectomy; Appendectomy; Colonoscopy (2016); and Cardiac catheterization (N/A, 02/11/2015).  Events 10/21/2015  -admit  10/14 - fever resolved 11/02/15 - fever back 11/06/15- ICU tx   SUBJECTIVE/OVERNIGHT/INTERVAL HX 11/07/2015 - On an doff fever Overnight maintained wob without intubation. On face mask. +8L since admit    VITAL SIGNS: BP 116/80   Pulse 73   Temp 98.7 F (37.1 C) (Oral)   Resp (!) 27   Ht 5\' 4"  (1.626 m)   Wt 88.4 kg (194 lb 14.2 oz)   SpO2 100%   BMI 33.45 kg/m   HEMODYNAMICS:    VENTILATOR SETTINGS: FiO2 (%):  [100 %] 100 %  INTAKE / OUTPUT: I/O last 3 completed shifts: In: [id number] [IV Piggyback:1750] Out: 1000 [Urine:1000]  PHYSICAL EXAMINATION: General:  WNWDAAF. Resp distress noted  Neuro:  Intact HEENT:No JVD  / LAN Cardiovascular:  HSR RRR Lungs:  RR 30 but not paradoxical. Speaks full sentences Abdomen:  Tender, soft +bs Musculoskeletal:  intact Skin: warm and dry  LABS:  PULMONARY  Recent Labs Lab 11/03/15 0203 11/06/15 1235  PHART 7.435 7.465*  PCO2ART 34.9 34.3  PO2ART 127* 60.1*  HCO3 22.5 24.4  O2SAT 98.5 90.6    CBC  Recent Labs Lab 11/05/15 0520 11/06/15 0500 11/07/15 0331  HGB 9.7* 9.2* 8.5*  HCT 28.9* 28.3* 25.4*  WBC 4.3 6.3 5.3  PLT 127* 140* 129*    COAGULATION No results for input(s): INR in the last 168 hours.  CARDIAC   Recent Labs Lab 10/31/15 1516 10/31/15 2039 11/01/15 0146  TROPONINI 0.09* 0.11* 0.09*   No results for input(s): PROBNP in the last 168 hours.   CHEMISTRY  Recent Labs Lab 11/01/15 0146 11/02/15 0415 11/03/15 0235 11/04/15 0413 11/05/15 0520 11/06/15 0500 11/07/15 0331  NA 138 139 136 134* 136 134* 132*  K 3.8 3.9 3.4* 3.3* 3.7 3.1* 3.3*  CL 109 108 104 98* 100* 101 99*  CO2 22 22 24 26 27 25 25   GLUCOSE 146* 163* 121* 142* 100* 81 182*  BUN 17 14 11 17 17 18 18   CREATININE 1.15* 1.21* 1.33* 1.53* 1.35* 1.35* 1.26*  CALCIUM 7.7* 8.4* 8.0* 8.3* 8.2* 7.7* 7.5*  MG 1.6* 2.4  --  1.9  --   --   --    Estimated Creatinine Clearance: 55.6 mL/min (by C-G formula based on SCr of 1.26 mg/dL (H)).   LIVER  Recent Labs Lab 11/03/15 0235 11/05/15 0520  AST 52* 22  ALT 21 15  ALKPHOS 161* 89  BILITOT 0.8 1.1  PROT 5.3* 5.5*  ALBUMIN 2.5* 2.4*     INFECTIOUS  Recent Labs Lab 11/03/15 0235  11/05/15 0520 11/06/15 1613 11/07/15 0331  LATICACIDVEN 1.8  --   --   --   --   PROCALCITON  --   < > 1.19 1.31 1.07  < > = values in this interval not displayed.   ENDOCRINE CBG (last 3)   Recent Labs  11/06/15 1716 11/06/15 2136 11/07/15 0742  GLUCAP 179* 191* 175*         IMAGING x48h  - image(s) personally visualized  -   highlighted in bold Dg Chest Port 1 View  Result Date:  11/07/2015 CLINICAL DATA:  Followup for acute respiratory failure and hypoxemia. EXAM: PORTABLE CHEST 1 VIEW COMPARISON:  11/04/2015 and older studies. FINDINGS: Bilateral hazy airspace opacities have increased when compared to the prior exam. No convincing pleural effusion. No pneumothorax. Right PICC is stable with its tip in the lower superior vena cava. IMPRESSION: 1. Worsened lung aeration with increased bilateral airspace opacities consistent with bilateral infection and/or ARDS. Electronically Signed   By: Lajean Manes M.D.   On: 11/07/2015 07:30       STUDIES:    CULTURES: 10/19 bc x 2>>  ANTIBIOTICS: 10/17 zoysn>>  SIGNIFICANT EVENTS:   LINES/TUBES:   DISCUSSION: 55 yo with PMH of recent dx of Crohn's and reported chest pain, left leg pain and increasing SOB x 3 months. She was admitted 10/13 and treated for gastic infections associated with her Crohn's  disease but now has BASDZ and acute hypoxia and will be moved to ICU and possibly intubated.  ASSESSMENT / PLAN:  PULMONARY A: BASDZ OSA on Cpap per Dr. Halford Chessman Suspected pna , may be autoimmune response Acute hypoxic resp failure   - Persistent hypoxemia with WOB coping but tenuous .Pattern c/w ALI/ARDS +./- acute diast dysfn  P:   BiPAP to ward offf intubation - day time 2h on 2h off  + QHS DIURESE aggressive (8L positive) Continue ABx Check autommune Check RV Check urine strep Intubate if worse  CARDIOVASCULAR A:  HTN CC 1/17 with no CAD Chest pain x 2 mos Hx of Syncope P:  Hold hypertensives Diuresise  RENAL Lab Results  Component Value Date   CREATININE 1.26 (H) 11/07/2015   CREATININE 1.35 (H) 11/06/2015   CREATININE 1.35 (H) 11/05/2015    A:   CRI with possible AKI  P:   Follow creatine Diurse  GASTROINTESTINAL A:   Recent diagnosis of Crohn's on steroids Suspected fistula in terminal ileum P:   PPI GI following  HEMATOLOGIC  Recent Labs  11/06/15 0500 11/07/15 0331   HGB 9.2* 8.5*    A:   Anemia of chronic and critical illness  DVT protection P:  On lovenox Transfuse per protocol  INFECTIOUS A:   New Crohn's . With ulceration, ? Fistula terminal ileum BASDZ P:   Anti-infectives    Start     Dose/Rate Route Frequency Ordered Stop   11/06/15 1400  sulfamethoxazole-trimethoprim (BACTRIM) 320 mg of trimethoprim in dextrose 5 % 500 mL IVPB  Status:  Discontinued     320 mg of trimethoprim 346.7 mL/hr over 90 Minutes Intravenous Every 6 hours 11/06/15 1305 11/06/15 1310   11/06/15 1400  sulfamethoxazole-trimethoprim (BACTRIM) 320 mg of trimethoprim in dextrose 5 % 500 mL IVPB  Status:  Discontinued     320 mg  of trimethoprim 346.7 mL/hr over 90 Minutes Intravenous Every 8 hours 11/06/15 1310 11/06/15 1312   11/06/15 1400  sulfamethoxazole-trimethoprim (BACTRIM) 320 mg of trimethoprim in dextrose 5 % 500 mL IVPB     320 mg of trimethoprim 346.7 mL/hr over 90 Minutes Intravenous Every 6 hours 11/06/15 1312     11/05/15 1530  vancomycin (VANCOCIN) IVPB 1000 mg/200 mL premix     1,000 mg 200 mL/hr over 60 Minutes Intravenous  Once 11/05/15 1456 11/05/15 1710   11/03/15 0600  vancomycin (VANCOCIN) IVPB 750 mg/150 ml premix  Status:  Discontinued     750 mg 150 mL/hr over 60 Minutes Intravenous Every 12 hours 11/03/15 0550 11/03/15 1350   11/03/15 0600  piperacillin-tazobactam (ZOSYN) IVPB 3.375 g     3.375 g 12.5 mL/hr over 240 Minutes Intravenous Every 8 hours 11/03/15 0550     11/02/15 2000  ciprofloxacin (CIPRO) tablet 500 mg  Status:  Discontinued     500 mg Oral 2 times daily 11/02/15 0936 11/03/15 0536   11/02/15 1400  metroNIDAZOLE (FLAGYL) tablet 500 mg  Status:  Discontinued     500 mg Oral Every 8 hours 11/02/15 0936 11/03/15 0536   10/31/15 0800  ciprofloxacin (CIPRO) IVPB 400 mg  Status:  Discontinued     400 mg 200 mL/hr over 60 Minutes Intravenous Every 12 hours 10/29/2015 2308 11/02/15 0936   10/31/15 0300  metroNIDAZOLE (FLAGYL)  IVPB 500 mg  Status:  Discontinued     500 mg 100 mL/hr over 60 Minutes Intravenous Every 8 hours 10/23/2015 2239 11/02/15 0936   11/11/2015 1915  ciprofloxacin (CIPRO) IVPB 400 mg     400 mg 200 mL/hr over 60 Minutes Intravenous  Once 11/05/2015 1909 10/29/2015 2054   10/23/2015 1915  metroNIDAZOLE (FLAGYL) IVPB 500 mg     500 mg 100 mL/hr over 60 Minutes Intravenous  Once 11/06/2015 1909 11/12/2015 2047       ENDOCRINE A:   DM exacerbated by steroids for Crohn's  P:   SSI  NEUROLOGIC A:   Intact P:   RASS goal:0 May need sedation if intubated   FAMILY  - Updates: Patient updated at bedside 11/07/2015 in presence of RN  - Inter-disciplinary family meet or Palliative Care meeting due by:  10/27     The patient is critically ill with multiple organ systems failure and requires high complexity decision making for assessment and support, frequent evaluation and titration of therapies, application of advanced monitoring technologies and extensive interpretation of multiple databases.   Critical Care Time devoted to patient care services described in this note is  30  Minutes. This time reflects time of care of this signee Dr Brand Males. This critical care time does not reflect procedure time, or teaching time or supervisory time of PA/NP/Med student/Med Resident etc but could involve care discussion time    Dr. Brand Males, M.D., [facility].C.P Pulmonary and Critical Care Medicine Staff Physician [place] [facility] Pager: [phone], If no answer or between  15:00h - 7:00h: call [phone]  11/07/2015 7:48 AM

## 2015-11-07 NOTE — Progress Notes (Signed)
RT placed patient BIPAP HS. Patient placed on 10/5 and 40%. Patient tolerating well at this time.

## 2015-11-08 ENCOUNTER — Inpatient Hospital Stay (HOSPITAL_COMMUNITY): Payer: BC Managed Care – PPO

## 2015-11-08 DIAGNOSIS — B20 Human immunodeficiency virus [HIV] disease: Secondary | ICD-10-CM

## 2015-11-08 DIAGNOSIS — B59 Pneumocystosis: Secondary | ICD-10-CM

## 2015-11-08 LAB — CULTURE, BLOOD (ROUTINE X 2)
CULTURE: NO GROWTH
Culture: NO GROWTH

## 2015-11-08 LAB — CBC WITH DIFFERENTIAL/PLATELET
BASOS ABS: 0 10*3/uL (ref 0.0–0.1)
BASOS PCT: 0 %
EOS PCT: 0 %
Eosinophils Absolute: 0 10*3/uL (ref 0.0–0.7)
HCT: 25.4 % — ABNORMAL LOW (ref 36.0–46.0)
Hemoglobin: 8.5 g/dL — ABNORMAL LOW (ref 12.0–15.0)
Lymphocytes Relative: 2 %
Lymphs Abs: 0.1 10*3/uL — ABNORMAL LOW (ref 0.7–4.0)
MCH: 28.5 pg (ref 26.0–34.0)
MCHC: 33.5 g/dL (ref 30.0–36.0)
MCV: 85.2 fL (ref 78.0–100.0)
MONO ABS: 0.1 10*3/uL (ref 0.1–1.0)
MONOS PCT: 2 %
Neutro Abs: 6.1 10*3/uL (ref 1.7–7.7)
Neutrophils Relative %: 96 %
PLATELETS: 179 10*3/uL (ref 150–400)
RBC: 2.98 MIL/uL — ABNORMAL LOW (ref 3.87–5.11)
RDW: 16 % — AB (ref 11.5–15.5)
WBC: 6.3 10*3/uL (ref 4.0–10.5)

## 2015-11-08 LAB — BASIC METABOLIC PANEL
ANION GAP: 10 (ref 5–15)
BUN: 18 mg/dL (ref 6–20)
CALCIUM: 7.6 mg/dL — AB (ref 8.9–10.3)
CO2: 24 mmol/L (ref 22–32)
CREATININE: 1.58 mg/dL — AB (ref 0.44–1.00)
Chloride: 97 mmol/L — ABNORMAL LOW (ref 101–111)
GFR, EST AFRICAN AMERICAN: 42 mL/min — AB (ref >60)
GFR, EST NON AFRICAN AMERICAN: 36 mL/min — AB (ref >60)
GLUCOSE: 114 mg/dL — AB (ref 65–99)
Potassium: 4.2 mmol/L (ref 3.5–5.1)
Sodium: 131 mmol/L — ABNORMAL LOW (ref 135–145)

## 2015-11-08 LAB — GLUCOSE, CAPILLARY
GLUCOSE-CAPILLARY: 148 mg/dL — AB (ref 65–99)
GLUCOSE-CAPILLARY: 225 mg/dL — AB (ref 65–99)
GLUCOSE-CAPILLARY: 100 mg/dL — AB (ref 65–99)
Glucose-Capillary: 196 mg/dL — ABNORMAL HIGH (ref 65–99)

## 2015-11-08 LAB — LACTATE DEHYDROGENASE: LDH: 926 U/L — ABNORMAL HIGH (ref 98–192)

## 2015-11-08 LAB — RHEUMATOID FACTOR: RHEUMATOID FACTOR: 11.6 [IU]/mL (ref 0.0–13.9)

## 2015-11-08 LAB — MAGNESIUM: Magnesium: 2 mg/dL (ref 1.7–2.4)

## 2015-11-08 LAB — PHOSPHORUS: PHOSPHORUS: 3.1 mg/dL (ref 2.5–4.6)

## 2015-11-08 LAB — CYCLIC CITRUL PEPTIDE ANTIBODY, IGG/IGA: CCP ANTIBODIES IGG/IGA: 17 U (ref 0–19)

## 2015-11-08 LAB — PROCALCITONIN: PROCALCITONIN: 0.69 ng/mL

## 2015-11-08 MED ORDER — FUROSEMIDE 10 MG/ML IJ SOLN
40.0000 mg | Freq: Every day | INTRAMUSCULAR | Status: DC
  Administered 2015-11-08 – 2015-11-10 (×3): 40 mg via INTRAVENOUS
  Filled 2015-11-08 (×2): qty 4

## 2015-11-08 MED ORDER — ONDANSETRON HCL 4 MG/2ML IJ SOLN
4.0000 mg | Freq: Four times a day (QID) | INTRAMUSCULAR | Status: DC | PRN
  Administered 2015-11-08 – 2015-11-12 (×3): 4 mg via INTRAVENOUS
  Filled 2015-11-08 (×3): qty 2

## 2015-11-08 NOTE — Progress Notes (Signed)
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 She is on BiPAP, ID following and starting HIV therapy. She reports abdominal pain / diarrhea improved, stools not bothering her.    Objective   Vital signs in last 24 hours: Temp:  [97.5 F (36.4 C)-99 F (37.2 C)] 98.1 F (36.7 C) (10/22 1148) Pulse Rate:  [61-80] 69 (10/22 1000) Resp:  [19-33] 32 (10/22 1000) BP: (92-126)/(65-82) 107/67 (10/22 1000) SpO2:  [90 %-100 %] 100 % (10/22 1000) FiO2 (%):  [40 %] 40 % (10/22 1251) Weight:  [192 lb 14.4 oz (87.5 kg)] 192 lb 14.4 oz (87.5 kg) (10/22 0354) Last BM Date: 11/07/15 General:    AA female with BiPAP in place Heart:  Regular rate and rhythm; no murmurs Lungs: Respirations even and coarse anteriorly Abdomen:  Soft, nontender and nondistended. Normal bowel sounds. Extremities:  Without edema. Neurologic:  Alert and oriented,  grossly normal neurologically. Psych:  Cooperative. Normal mood and affect.  Intake/Output from previous day: 10/21 0701 - 10/22 0700 In: 1877.5 [I.V.:180; IV Piggyback:1697.5] Out: 4075 [Urine:4075] Intake/Output this shift: Total I/O In: 350 [P.O.:350] Out: -   Lab Results:  Recent Labs  11/06/15 0500 11/07/15 0331 11/08/15 0346  WBC 6.3 5.3 6.3  HGB 9.2* 8.5* 8.5*  HCT 28.3* 25.4* 25.4*  PLT 140* 129* 179   BMET  Recent Labs  11/06/15 0500 11/07/15 0331 11/08/15 0346  NA 134* 132* 131*  K 3.1* 3.3* 4.2  CL 101 99* 97*  CO2 25 25 24   GLUCOSE 81 182* 114*  BUN 18 18 18   CREATININE 1.35* 1.26* 1.58*  CALCIUM 7.7* 7.5* 7.6*   LFT No results for input(s): PROT, ALBUMIN, AST, ALT, ALKPHOS, BILITOT, BILIDIR, IBILI in the last 72 hours. PT/INR No results for input(s): LABPROT, INR in the last 72 hours.  Studies/Results: Dg Chest Port 1 View  Result Date: 11/08/2015 CLINICAL DATA:  Respiratory failure EXAM: PORTABLE CHEST 1 VIEW COMPARISON:  11/07/2015  FINDINGS: Cardiac shadow is stable. Right PICC line is again noted. Diffuse bilateral infiltrates are again seen and stable. No new focal abnormality is noted. IMPRESSION: No significant interval change. Electronically Signed   By: Inez Catalina M.D.   On: 11/08/2015 08:54   Dg Chest Port 1 View  Result Date: 11/07/2015 CLINICAL DATA:  Followup for acute respiratory failure and hypoxemia. EXAM: PORTABLE CHEST 1 VIEW COMPARISON:  11/04/2015 and older studies. FINDINGS: Bilateral hazy airspace opacities have increased when compared to the prior exam. No convincing pleural effusion. No pneumothorax. Right PICC is stable with its tip in the lower superior vena cava. IMPRESSION: 1. Worsened lung aeration with increased bilateral airspace opacities consistent with bilateral infection and/or ARDS. Electronically Signed   By: Lajean Manes M.D.   On: 11/07/2015 07:30       Assessment / Plan:   55 y/o female who presented with a flare of what was thought to be ileocolonic Crohns based on colonoscopy and MRE / CT scans. She had improved somewhatwith IV steroids initially in her course however developed hospital acquired pneumonia, worsening recently leading to ICU transfer. She has just tested positive for HIV / AIDS yesterday, and likely has PCP pneumonia.  With the recent diagnosis of HIV, I question the diagnosis of IBD/Crohn's, and her bowel findings could more likely be a manifestation of HIV. Her prior GI pathogen panel was negative. I would observe her course with HIV  therapy and see if her bowel symptoms resolve. She will warrant repeat imaging and colonoscopy once her HIV is under control to reassess her bowel and more definitively rule out IBD. For now, medical management per ID and critical care. CMV PCR sent and this remains pending.   Will continue to follow. Please call with questions.   Yeager Cellar, MD [facility] Gastroenterology Pager [phone]

## 2015-11-08 NOTE — Progress Notes (Signed)
PULMONARY / CRITICAL CARE MEDICINE   Name: Nancy Acevedo MRN: [id number] DOB: Jun 21, 1960    ADMISSION DATE:  10/21/2015 CONSULTATION DATE:  10/20  REFERRING MD:  [facility]  CHIEF COMPLAINT: Can't breathe  briuef 55 yo with PMH of recent dx of Crohn's and reported chest pain, left leg pain and increasing SOB x 3 months. She was admitted 10/13 and treated for gastic infections associated with her Crohn's  disease but now has BASDZ and acute hypoxia and will be moved to ICU and possibly intubated.  PAST MEDICAL HISTORY :  She  has a past medical history of Candidiasis of mouth; Complication of anesthesia; Crohn's disease of both small and large intestine with fistula ([place]) (11/11/2015); Diabetes mellitus, type 2 ([place]) (dx 01/2011); EP (ectopic pregnancy); GERD (gastroesophageal reflux disease); Gout; Headaches, cluster; History of hyperglycemia (1993); Hypertension; OSA (obstructive sleep apnea); Sleep apnea; Syncope (02/09/15); and Syncope (02/10/2015).  PAST SURGICAL HISTORY: She  has a past surgical history that includes Tonsillectomy and adenoidectomy (2012); Ectopic pregnancy surgery; Myringotomy with tube placement; Tubal ligation; Tonsillectomy; Appendectomy; Colonoscopy (2016); and Cardiac catheterization (N/A, 02/11/2015).  Events 11/11/2015  -admit  10/14 - fever resolved 11/02/15 - fever back 10/17 -- MRSA Pcr negative 11/06/15- ICU tx 11/07/15 - HIV +ve from10/20/17, RVP  Negative, C Diff negative .STart bipap and lasix. ID cnsult -> Rx PCP (DO NOT DISCLOSE TO FAMILY). AUTOIMMUNE ORDERED   SUBJECTIVE/OVERNIGHT/INTERVAL HX 11/08/2015 -evebnts from yesterday noted. Not on pressor.s. On and off bipap in day with QHS bipap. On lasix-ve 1.6L . She Is feeling better. RN says she is bettter.    VITAL SIGNS: BP 96/66   Pulse 73   Temp 97.5 F (36.4 C) (Axillary)   Resp (!) 27   Ht 5\' 4"  (1.626 m)   Wt 87.5 kg (192 lb 14.4 oz)   SpO2 100%   BMI 33.11 kg/m   HEMODYNAMICS:     VENTILATOR SETTINGS: Vent Mode: BIPAP FiO2 (%):  [40 %] 40 % Set Rate:  [8 bmp] 8 bmp PEEP:  [5 cmH20] 5 cmH20  INTAKE / OUTPUT: I/O last 3 completed shifts: In: 2977.5 [I.V.:180; IV Piggyback:2797.5] Out: [id number] [Urine:4075]  PHYSICAL EXAMINATION: General:  No resp distress Neuro:  Intact HEENT:No JVD / LAN Cardiovascular:  HSR RRR Lungs:  RR 20, not paradoxical. Speaks full sentences.No crackles Abdomen:  Tender, soft +bs Musculoskeletal:  intact Skin: warm and dry  LABS:  PULMONARY  Recent Labs Lab 11/03/15 0203 11/06/15 1235  PHART 7.435 7.465*  PCO2ART 34.9 34.3  PO2ART 127* 60.1*  HCO3 22.5 24.4  O2SAT 98.5 90.6    CBC  Recent Labs Lab 11/06/15 0500 11/07/15 0331 11/08/15 0346  HGB 9.2* 8.5* 8.5*  HCT 28.3* 25.4* 25.4*  WBC 6.3 5.3 6.3  PLT 140* 129* 179    COAGULATION No results for input(s): INR in the last 168 hours.  CARDIAC  No results for input(s): TROPONINI in the last 168 hours. No results for input(s): PROBNP in the last 168 hours.   CHEMISTRY  Recent Labs Lab 11/02/15 0415  11/04/15 0413 11/05/15 0520 11/06/15 0500 11/07/15 0331 11/08/15 0346  NA 139  < > 134* 136 134* 132* 131*  K 3.9  < > 3.3* 3.7 3.1* 3.3* 4.2  CL 108  < > 98* 100* 101 99* 97*  CO2 22  < > 26 27 25 25 24   GLUCOSE 163*  < > 142* 100* 81 182* 114*  BUN 14  < > 17 17 18  18  18  CREATININE 1.21*  < > 1.53* 1.35* 1.35* 1.26* 1.58*  CALCIUM 8.4*  < > 8.3* 8.2* 7.7* 7.5* 7.6*  MG 2.4  --  1.9  --   --   --  2.0  PHOS  --   --   --   --   --   --  3.1  < > = values in this interval not displayed. Estimated Creatinine Clearance: 44.1 mL/min (by C-G formula based on SCr of 1.58 mg/dL (H)).   LIVER  Recent Labs Lab 11/03/15 0235 11/05/15 0520  AST 52* 22  ALT 21 15  ALKPHOS 161* 89  BILITOT 0.8 1.1  PROT 5.3* 5.5*  ALBUMIN 2.5* 2.4*     INFECTIOUS  Recent Labs Lab 11/03/15 0235  11/06/15 1613 11/07/15 0331 11/08/15 0346  LATICACIDVEN 1.8   --   --   --   --   PROCALCITON  --   < > 1.31 1.07 0.69  < > = values in this interval not displayed.   ENDOCRINE CBG (last 3)   Recent Labs  11/07/15 1642 11/07/15 2342 11/08/15 0731  GLUCAP 203* 166* 148*         IMAGING x48h  - image(s) personally visualized  -   highlighted in bold Dg Chest Port 1 View  Result Date: 11/07/2015 CLINICAL DATA:  Followup for acute respiratory failure and hypoxemia. EXAM: PORTABLE CHEST 1 VIEW COMPARISON:  11/04/2015 and older studies. FINDINGS: Bilateral hazy airspace opacities have increased when compared to the prior exam. No convincing pleural effusion. No pneumothorax. Right PICC is stable with its tip in the lower superior vena cava. IMPRESSION: 1. Worsened lung aeration with increased bilateral airspace opacities consistent with bilateral infection and/or ARDS. Electronically Signed   By: Lajean Manes M.D.   On: 11/07/2015 07:30   cxr 10/22 - improved ppersonally visualizeed    DISCUSSION: 55 yo with PMH of recent dx of Crohn's and reported chest pain, left leg pain and increasing SOB x 3 months. She was admitted 10/13 and treated for gastic infections associated with her Crohn's  disease but now has BASDZ and acute hypoxia and will be moved to ICU and possibly intubated.  ASSESSMENT / PLAN:  PULMONARY A: #baseline  #@admit   - Acute hypoxic resp failure with infiltrates - patterern c/w ALI/ARDS due to HIV/AIDS related +./- acute diast dysfn  #current 11/08/2015  - largey better with bipap, lasix and start of HIV and PCP Rx  P:   BiPAP to ward offf intubation - day time 2h on 2h off  + QHS - to continue through 11/08/2015 DIURESE aggressive (8L positive -> -1.6L ) and can back off 11/08/2015 Continue ABx per ID Await autommune  from 11/07/15   CARDIOVASCULAR A:  HTN CC 1/17 with no CAD Chest pain x 2 mos Hx of Syncope P:  Back on home hypertensives Diuresise  RENAL Lab Results  Component Value Date    CREATININE 1.58 (H) 11/08/2015   CREATININE 1.26 (H) 11/07/2015   CREATININE 1.35 (H) 11/06/2015    A:   CRI with possible AKI  P:   Follow creatine Back off lasix now she is in negative balance - track and dc lasix altogether f creat rising  GASTROINTESTINAL A:   Recent diagnosis of Crohn's on steroids Suspected fistula in terminal ileum P:   PPI GI following - tGI to consider HIV related pathologies in GI track.   HEMATOLOGIC  Recent Labs  11/07/15 0331 11/08/15 0346  HGB 8.5* 8.5*    A:   Anemia of chronic and critical illness  DVT protection P:  On lovenox Transfuse per protocol  INFECTIOUS A:   # New Crohn's . With ulceration, - July 2017 #new HIV/AIDS 11/06/15 resulted 10/121/17    P:   Per ID consult Anti-infectives    Start     Dose/Rate Route Frequency Ordered Stop   11/07/15 1700  dolutegravir (TIVICAY) tablet 50 mg     50 mg Oral Daily 11/07/15 1556     11/07/15 1700  emtricitabine-tenofovir AF (DESCOVY) 200-25 MG per tablet 1 tablet     1 tablet Oral Daily 11/07/15 1556     11/06/15 1400  sulfamethoxazole-trimethoprim (BACTRIM) 320 mg of trimethoprim in dextrose 5 % 500 mL IVPB  Status:  Discontinued     320 mg of trimethoprim 346.7 mL/hr over 90 Minutes Intravenous Every 6 hours 11/06/15 1305 11/06/15 1310   11/06/15 1400  sulfamethoxazole-trimethoprim (BACTRIM) 320 mg of trimethoprim in dextrose 5 % 500 mL IVPB  Status:  Discontinued     320 mg of trimethoprim 346.7 mL/hr over 90 Minutes Intravenous Every 8 hours 11/06/15 1310 11/06/15 1312   11/06/15 1400  sulfamethoxazole-trimethoprim (BACTRIM) 320 mg of trimethoprim in dextrose 5 % 500 mL IVPB     320 mg of trimethoprim 346.7 mL/hr over 90 Minutes Intravenous Every 6 hours 11/06/15 1312     11/05/15 1530  vancomycin (VANCOCIN) IVPB 1000 mg/200 mL premix     1,000 mg 200 mL/hr over 60 Minutes Intravenous  Once 11/05/15 1456 11/05/15 1710   11/03/15 0600  vancomycin (VANCOCIN) IVPB 750  mg/150 ml premix  Status:  Discontinued     750 mg 150 mL/hr over 60 Minutes Intravenous Every 12 hours 11/03/15 0550 11/03/15 1350   11/03/15 0600  piperacillin-tazobactam (ZOSYN) IVPB 3.375 g     3.375 g 12.5 mL/hr over 240 Minutes Intravenous Every 8 hours 11/03/15 0550     11/02/15 2000  ciprofloxacin (CIPRO) tablet 500 mg  Status:  Discontinued     500 mg Oral 2 times daily 11/02/15 0936 11/03/15 0536   11/02/15 1400  metroNIDAZOLE (FLAGYL) tablet 500 mg  Status:  Discontinued     500 mg Oral Every 8 hours 11/02/15 0936 11/03/15 0536   10/31/15 0800  ciprofloxacin (CIPRO) IVPB 400 mg  Status:  Discontinued     400 mg 200 mL/hr over 60 Minutes Intravenous Every 12 hours 11/06/2015 2308 11/02/15 0936   10/31/15 0300  metroNIDAZOLE (FLAGYL) IVPB 500 mg  Status:  Discontinued     500 mg 100 mL/hr over 60 Minutes Intravenous Every 8 hours 10/22/2015 2239 11/02/15 0936   10/19/2015 1915  ciprofloxacin (CIPRO) IVPB 400 mg     400 mg 200 mL/hr over 60 Minutes Intravenous  Once 11/13/2015 1909 10/29/2015 2054   10/27/2015 1915  metroNIDAZOLE (FLAGYL) IVPB 500 mg     500 mg 100 mL/hr over 60 Minutes Intravenous  Once 11/07/2015 1909 11/14/2015 2047       ENDOCRINE A:   DM exacerbated by steroids for Crohn's  P:   SSI  NEUROLOGIC A:   Intact P:      FAMILY  - Updates: Patient updated at bedside 11/08/2015 in presence of RN  - Inter-disciplinary family meet or Palliative Care meeting due by:  10/27   GLOBAL Move to sdu. Back to [facility] for pick up 11/09/15 and PCCM off      Dr. Brand Males, M.D., [facility]P Pulmonary and  Critical Care Medicine Staff Physician [facility] Pager: [phone], If no answer or between  15:00h - 7:00h: call [phone]  11/08/2015 7:36 AM

## 2015-11-08 NOTE — Progress Notes (Signed)
Subjective:  Coughing more with deep breaths   Antibiotics:  Anti-infectives    Start     Dose/Rate Route Frequency Ordered Stop   11/07/15 1700  dolutegravir (TIVICAY) tablet 50 mg     50 mg Oral Daily 11/07/15 1556     11/07/15 1700  emtricitabine-tenofovir AF (DESCOVY) 200-25 MG per tablet 1 tablet     1 tablet Oral Daily 11/07/15 1556     11/06/15 1400  sulfamethoxazole-trimethoprim (BACTRIM) 320 mg of trimethoprim in dextrose 5 % 500 mL IVPB  Status:  Discontinued     320 mg of trimethoprim 346.7 mL/hr over 90 Minutes Intravenous Every 6 hours 11/06/15 1305 11/06/15 1310   11/06/15 1400  sulfamethoxazole-trimethoprim (BACTRIM) 320 mg of trimethoprim in dextrose 5 % 500 mL IVPB  Status:  Discontinued     320 mg of trimethoprim 346.7 mL/hr over 90 Minutes Intravenous Every 8 hours 11/06/15 1310 11/06/15 1312   11/06/15 1400  sulfamethoxazole-trimethoprim (BACTRIM) 320 mg of trimethoprim in dextrose 5 % 500 mL IVPB     320 mg of trimethoprim 346.7 mL/hr over 90 Minutes Intravenous Every 6 hours 11/06/15 1312     11/05/15 1530  vancomycin (VANCOCIN) IVPB 1000 mg/200 mL premix     1,000 mg 200 mL/hr over 60 Minutes Intravenous  Once 11/05/15 1456 11/05/15 1710   11/03/15 0600  vancomycin (VANCOCIN) IVPB 750 mg/150 ml premix  Status:  Discontinued     750 mg 150 mL/hr over 60 Minutes Intravenous Every 12 hours 11/03/15 0550 11/03/15 1350   11/03/15 0600  piperacillin-tazobactam (ZOSYN) IVPB 3.375 g  Status:  Discontinued     3.375 g 12.5 mL/hr over 240 Minutes Intravenous Every 8 hours 11/03/15 0550 11/08/15 1020   11/02/15 2000  ciprofloxacin (CIPRO) tablet 500 mg  Status:  Discontinued     500 mg Oral 2 times daily 11/02/15 0936 11/03/15 0536   11/02/15 1400  metroNIDAZOLE (FLAGYL) tablet 500 mg  Status:  Discontinued     500 mg Oral Every 8 hours 11/02/15 0936 11/03/15 0536   10/31/15 0800  ciprofloxacin (CIPRO) IVPB 400 mg  Status:  Discontinued     400 mg 200  mL/hr over 60 Minutes Intravenous Every 12 hours 10/31/2015 2308 11/02/15 0936   10/31/15 0300  metroNIDAZOLE (FLAGYL) IVPB 500 mg  Status:  Discontinued     500 mg 100 mL/hr over 60 Minutes Intravenous Every 8 hours 11/12/2015 2239 11/02/15 0936   11/15/2015 1915  ciprofloxacin (CIPRO) IVPB 400 mg     400 mg 200 mL/hr over 60 Minutes Intravenous  Once 10/22/2015 1909 11/17/2015 2054   10/31/2015 1915  metroNIDAZOLE (FLAGYL) IVPB 500 mg     500 mg 100 mL/hr over 60 Minutes Intravenous  Once 11/12/2015 1909 10/27/2015 2047      Medications: Scheduled Meds: . amLODipine  10 mg Oral Daily  . dolutegravir  50 mg Oral Daily  . emtricitabine-tenofovir AF  1 tablet Oral Daily  . enoxaparin (LOVENOX) injection  40 mg Subcutaneous Q24H  . furosemide  40 mg Intravenous Daily  . insulin aspart  0-5 Units Subcutaneous QHS  . insulin aspart  0-9 Units Subcutaneous TID WC  . methylPREDNISolone (SOLU-MEDROL) injection  40 mg Intravenous Q12H  . metoprolol tartrate  50 mg Oral BID  . potassium chloride  40 mEq Oral BID  . sodium chloride flush  10-40 mL Intracatheter Q12H  . sodium chloride flush  3 mL Intravenous Q12H  .  sulfamethoxazole-trimethoprim  320 mg of trimethoprim Intravenous Q6H   Continuous Infusions:  PRN Meds:.hydrALAZINE, sodium chloride flush    Objective: Weight change: -1 lb 15.8 oz (-0.9 kg)  Intake/Output Summary (Last 24 hours) at 11/08/15 1021 Last data filed at 11/08/15 0034  Gross per 24 hour  Intake           1877.5 ml  Output             4075 ml  Net          -2197.5 ml   Blood pressure 111/74, pulse 78, temperature 97.5 F (36.4 C), temperature source Axillary, resp. rate 19, height _0  (1.626 m), weight 192 lb 14.4 oz (87.5 kg), SpO2 96 %. Temp:  [97.5 F (36.4 C)-99 F (37.2 C)] 97.5 F (36.4 C) (10/22 0730) Pulse Rate:  [61-86] 78 (10/22 0900) Resp:  [19-33] 19 (10/22 0900) BP: (92-148)/(65-83) 111/74 (10/22 0900) SpO2:  [90 %-100 %] 96 % (10/22 0900) FiO2 (%):   [40 %] 40 % (10/21 2245) Weight:  [192 lb 14.4 oz (87.5 kg)] 192 lb 14.4 oz (87.5 kg) (10/22 0354)  Physical Exam: General: Alert and awake, oriented x3, Wearing BIPAP HEENT: anicteric sclera,  EOMI, oropharynx clear and without exudate Cardiovascular: regular rate, normal r,  no murmur rubs or gallops Pulmonary:  rhonchi anteriorly, difficulty with deep breath which causes coughing Gastrointestinal l:  obese soft nontender, nondistended, normal bowel sounds, Skin, soft tissue: no rashes Neuro: nonfocal, strength and sensation intact CBC:  CBC Latest Ref Rng & Units 11/08/2015 11/07/2015 11/06/2015  WBC 4.0 - 10.5 K/uL 6.3 5.3 6.3  Hemoglobin 12.0 - 15.0 g/dL 8.5(L) 8.5(L) 9.2(L)  Hematocrit 36.0 - 46.0 % 25.4(L) 25.4(L) 28.3(L)  Platelets 150 - 400 K/uL 179 129(L) 140(L)      BMET  Recent Labs  11/07/15 0331 11/08/15 0346  NA 132* 131*  K 3.3* 4.2  CL 99* 97*  CO2 25 24  GLUCOSE 182* 114*  BUN 18 18  CREATININE 1.26* 1.58*  CALCIUM 7.5* 7.6*     Liver Panel  No results for input(s): PROT, ALBUMIN, AST, ALT, ALKPHOS, BILITOT, BILIDIR, IBILI in the last 72 hours.     Sedimentation Rate  Recent Labs  11/07/15 0800  ESRSEDRATE 80*   C-Reactive Protein No results for input(s): CRP in the last 72 hours.  Micro Results: Recent Results (from the past 720 hour(s))  C difficile quick screen w PCR reflex     Status: None   Collection Time: 10/31/15 10:00 AM  Result Value Ref Range Status   C Diff antigen NEGATIVE NEGATIVE Final   C Diff toxin NEGATIVE NEGATIVE Final   C Diff interpretation No C. difficile detected.  Final  Culture, blood (routine x 2)     Status: None (Preliminary result)   Collection Time: 11/03/15  2:24 AM  Result Value Ref Range Status   Specimen Description BLOOD LEFT ANTECUBITAL  Final   Special Requests BOTTLES DRAWN AEROBIC AND ANAEROBIC 5CC  Final   Culture NO GROWTH 4 DAYS  Final   Report Status PENDING  Incomplete  Culture, blood  (routine x 2)     Status: None (Preliminary result)   Collection Time: 11/03/15  2:30 AM  Result Value Ref Range Status   Specimen Description BLOOD LEFT HAND  Final   Special Requests BOTTLES DRAWN AEROBIC AND ANAEROBIC 5CC  Final   Culture NO GROWTH 4 DAYS  Final   Report Status PENDING  Incomplete  MRSA PCR Screening     Status: None   Collection Time: 11/03/15  5:09 AM  Result Value Ref Range Status   MRSA by PCR NEGATIVE NEGATIVE Final    Comment:        The GeneXpert MRSA Assay (FDA approved for NASAL specimens only), is one component of a comprehensive MRSA colonization surveillance program. It is not intended to diagnose MRSA infection nor to guide or monitor treatment for MRSA infections.   Culture, blood (routine x 2)     Status: None (Preliminary result)   Collection Time: 11/05/15  9:50 AM  Result Value Ref Range Status   Specimen Description BLOOD LEFT HAND  Final   Special Requests BOTTLES DRAWN AEROBIC ONLY  5CC  Final   Culture NO GROWTH 2 DAYS  Final   Report Status PENDING  Incomplete  Culture, blood (routine x 2)     Status: None (Preliminary result)   Collection Time: 11/05/15  9:58 AM  Result Value Ref Range Status   Specimen Description BLOOD LEFT WRIST  Final   Special Requests BOTTLES DRAWN AEROBIC ONLY  5CC  Final   Culture NO GROWTH 2 DAYS  Final   Report Status PENDING  Incomplete  C difficile quick scan w PCR reflex     Status: None   Collection Time: 11/05/15 11:05 AM  Result Value Ref Range Status   C Diff antigen NEGATIVE NEGATIVE Final   C Diff toxin NEGATIVE NEGATIVE Final   C Diff interpretation No C. difficile detected.  Final  Respiratory Panel by PCR     Status: None   Collection Time: 11/07/15 11:47 AM  Result Value Ref Range Status   Adenovirus NOT DETECTED NOT DETECTED Final   Coronavirus 229E NOT DETECTED NOT DETECTED Final   Coronavirus HKU1 NOT DETECTED NOT DETECTED Final   Coronavirus NL63 NOT DETECTED NOT DETECTED Final    Coronavirus OC43 NOT DETECTED NOT DETECTED Final   Metapneumovirus NOT DETECTED NOT DETECTED Final   Rhinovirus / Enterovirus NOT DETECTED NOT DETECTED Final   Influenza A NOT DETECTED NOT DETECTED Final   Influenza B NOT DETECTED NOT DETECTED Final   Parainfluenza Virus 1 NOT DETECTED NOT DETECTED Final   Parainfluenza Virus 2 NOT DETECTED NOT DETECTED Final   Parainfluenza Virus 3 NOT DETECTED NOT DETECTED Final   Parainfluenza Virus 4 NOT DETECTED NOT DETECTED Final   Respiratory Syncytial Virus NOT DETECTED NOT DETECTED Final   Bordetella pertussis NOT DETECTED NOT DETECTED Final   Chlamydophila pneumoniae NOT DETECTED NOT DETECTED Final   Mycoplasma pneumoniae NOT DETECTED NOT DETECTED Final    Studies/Results: Dg Chest Port 1 View  Result Date: 11/08/2015 CLINICAL DATA:  Respiratory failure EXAM: PORTABLE CHEST 1 VIEW COMPARISON:  11/07/2015 FINDINGS: Cardiac shadow is stable. Right PICC line is again noted. Diffuse bilateral infiltrates are again seen and stable. No new focal abnormality is noted. IMPRESSION: No significant interval change. Electronically Signed   By: Inez Catalina M.D.   On: 11/08/2015 08:54   Dg Chest Port 1 View  Result Date: 11/07/2015 CLINICAL DATA:  Followup for acute respiratory failure and hypoxemia. EXAM: PORTABLE CHEST 1 VIEW COMPARISON:  11/04/2015 and older studies. FINDINGS: Bilateral hazy airspace opacities have increased when compared to the prior exam. No convincing pleural effusion. No pneumothorax. Right PICC is stable with its tip in the lower superior vena cava. IMPRESSION: 1. Worsened lung aeration with increased bilateral airspace opacities consistent with bilateral infection and/or ARDS. Electronically Signed  By: Lajean Manes M.D.   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[place]) Active Problems:   Diabetes mellitus,  type 2 (HCC)   OSA (obstructive sleep apnea)   Hypokalemia   Gastroesophageal reflux disease with esophagitis   Crohn's disease of both small and large intestine with fistula ([facility])   Essential hypertension, benign   Normocytic anemia   Thrombocytopenia ([facility])   CKD (chronic kidney disease), stage II   Sepsis, unspecified organism ([place])   Hypertensive urgency   Crohn's disease of large intestine with fistula ([place])   Lobar pneumonia ([place])   Acute hypoxemic respiratory failure ([facility])   Pressure injury of skin   AIDS ([place])   Hypoxia    Nancy Acevedo is a 55 y.o. 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 with likely PCP pneumonia  #1 HIV/AIDS:  --treat for PCP pneumonia --I'm ordering HIV viral load reflex genotype HLA [id number] she has already had testing for hepatitis B virus is --I will started her on TIVICAY and DESCOVY which will drop her VL rapidly and allow CD4 to rise (she will need copay cards from [place] for these meds when she nears DC and we can arrange to fill at [place] prior to DC (insurance permitting)  --her boyfriend needs to be informed by the patient as well as her former husband  SHE DOES NOT WANT HER DIAGNOSIS DISCLOSED TO [facility]  #2 PCP PNA: Seems much more likely than a gram-negative or anaerobic infection continue IV Bactrim and make sure she is getting adequate steroids as well in the context of likely PCP pneumonia I will add an LDH  I am stopping the zosyn  #3 Crohn's disease: Is this a firm diagnosis? Certainly the fistulas make it something to think about but I wonder if her HIV could've also played a role in her gastrointestinal pathology?  I CERTAINLY WOULD NOT USE REMICAID IN near future until we can restore her immune function from HIV standpoint  Dr. Baxter Flattery is back tomorrow.   LOS: 9 days    Alcide Evener 11/08/2015, 10:21 AM

## 2015-11-08 NOTE — Progress Notes (Signed)
[facility] Progress Note Patient Name: Nancy Acevedo DOB: [birth year] MRN: [id number]   Date of Service  11/08/2015  HPI/Events of Note  Nausea.  eICU Interventions  Zofran ordered.      Intervention Category Intermediate Interventions: Other:  Lysle Dingwall 11/08/2015, 7:36 PM

## 2015-11-09 DIAGNOSIS — N179 Acute kidney failure, unspecified: Secondary | ICD-10-CM

## 2015-11-09 DIAGNOSIS — K529 Noninfective gastroenteritis and colitis, unspecified: Secondary | ICD-10-CM

## 2015-11-09 DIAGNOSIS — E1122 Type 2 diabetes mellitus with diabetic chronic kidney disease: Secondary | ICD-10-CM

## 2015-11-09 DIAGNOSIS — N182 Chronic kidney disease, stage 2 (mild): Secondary | ICD-10-CM

## 2015-11-09 LAB — CBC WITH DIFFERENTIAL/PLATELET
Basophils Absolute: 0 10*3/uL (ref 0.0–0.1)
Basophils Relative: 0 %
EOS PCT: 0 %
Eosinophils Absolute: 0 10*3/uL (ref 0.0–0.7)
HEMATOCRIT: 25.6 % — AB (ref 36.0–46.0)
Hemoglobin: 8.7 g/dL — ABNORMAL LOW (ref 12.0–15.0)
LYMPHS ABS: 0.1 10*3/uL — AB (ref 0.7–4.0)
LYMPHS PCT: 2 %
MCH: 28.6 pg (ref 26.0–34.0)
MCHC: 34 g/dL (ref 30.0–36.0)
MCV: 84.2 fL (ref 78.0–100.0)
MONO ABS: 0.1 10*3/uL (ref 0.1–1.0)
Monocytes Relative: 2 %
Neutro Abs: 6 10*3/uL (ref 1.7–7.7)
Neutrophils Relative %: 96 %
PLATELETS: 221 10*3/uL (ref 150–400)
RBC: 3.04 MIL/uL — ABNORMAL LOW (ref 3.87–5.11)
RDW: 15.8 % — AB (ref 11.5–15.5)
WBC: 6.2 10*3/uL (ref 4.0–10.5)

## 2015-11-09 LAB — ANTI-DNA ANTIBODY, DOUBLE-STRANDED

## 2015-11-09 LAB — BASIC METABOLIC PANEL
Anion gap: 12 (ref 5–15)
BUN: 19 mg/dL (ref 6–20)
CALCIUM: 7.7 mg/dL — AB (ref 8.9–10.3)
CO2: 21 mmol/L — AB (ref 22–32)
Chloride: 95 mmol/L — ABNORMAL LOW (ref 101–111)
Creatinine, Ser: 1.51 mg/dL — ABNORMAL HIGH (ref 0.44–1.00)
GFR calc Af Amer: 44 mL/min — ABNORMAL LOW (ref >60)
GFR, EST NON AFRICAN AMERICAN: 38 mL/min — AB (ref >60)
GLUCOSE: 133 mg/dL — AB (ref 65–99)
POTASSIUM: 4.7 mmol/L (ref 3.5–5.1)
Sodium: 128 mmol/L — ABNORMAL LOW (ref 135–145)

## 2015-11-09 LAB — T-HELPER CELLS (CD4) COUNT (NOT AT ARMC)
CD4 % Helper T Cell: 3 % — ABNORMAL LOW (ref 33–55)
CD4 T Cell Abs: <10 /uL — ABNORMAL LOW (ref 400–2700)

## 2015-11-09 LAB — PHOSPHORUS: Phosphorus: 3.1 mg/dL (ref 2.5–4.6)

## 2015-11-09 LAB — MAGNESIUM: Magnesium: 1.9 mg/dL (ref 1.7–2.4)

## 2015-11-09 LAB — GLUCOSE, CAPILLARY
GLUCOSE-CAPILLARY: 201 mg/dL — AB (ref 65–99)
GLUCOSE-CAPILLARY: 146 mg/dL — AB (ref 65–99)
GLUCOSE-CAPILLARY: 118 mg/dL — AB (ref 65–99)
Glucose-Capillary: 123 mg/dL — ABNORMAL HIGH (ref 65–99)

## 2015-11-09 LAB — ANTI-SCLERODERMA ANTIBODY

## 2015-11-09 LAB — ANTINUCLEAR ANTIBODIES, IFA: ANA Ab, IFA: NEGATIVE

## 2015-11-09 LAB — MPO/PR-3 (ANCA) ANTIBODIES: ANCA Proteinase 3: <3.5 U/mL (ref 0.0–3.5)

## 2015-11-09 LAB — SJOGRENS SYNDROME-A EXTRACTABLE NUCLEAR ANTIBODY: SSA (Ro) (ENA) Antibody, IgG: <0.2 AI (ref 0.0–0.9)

## 2015-11-09 LAB — GLOMERULAR BASEMENT MEMBRANE ANTIBODIES: GBM Ab: 3 units (ref 0–20)

## 2015-11-09 LAB — SJOGRENS SYNDROME-B EXTRACTABLE NUCLEAR ANTIBODY: SSB (La) (ENA) Antibody, IgG: 0.4 AI (ref 0.0–0.9)

## 2015-11-09 LAB — CMV DNA BY PCR, QUALITATIVE: CMV DNA, Qual PCR: POSITIVE — AB

## 2015-11-09 MED ORDER — SACCHAROMYCES BOULARDII 250 MG PO CAPS
250.0000 mg | ORAL_CAPSULE | Freq: Two times a day (BID) | ORAL | Status: DC
  Administered 2015-11-09 – 2015-11-26 (×32): 250 mg via ORAL
  Filled 2015-11-09 (×38): qty 1

## 2015-11-09 MED ORDER — POTASSIUM CHLORIDE CRYS ER 20 MEQ PO TBCR
20.0000 meq | EXTENDED_RELEASE_TABLET | Freq: Every day | ORAL | Status: DC
  Administered 2015-11-10: 20 meq via ORAL
  Filled 2015-11-09: qty 1

## 2015-11-09 MED ORDER — PANTOPRAZOLE SODIUM 40 MG PO TBEC
40.0000 mg | DELAYED_RELEASE_TABLET | Freq: Every day | ORAL | Status: DC
  Administered 2015-11-10 – 2015-11-18 (×6): 40 mg via ORAL
  Filled 2015-11-09 (×8): qty 1

## 2015-11-09 NOTE — Progress Notes (Signed)
Pharmacy Antibiotic Note  Jayce Boyko is a 55 y.o. female admitted on 11/17/2015.  She is on Day #4 of Trimethoprim/sulfamethoxazole for presumed PCP in the setting of new HIV diagnosis and CD4 <10.   She has CKD and her SCr has trended up slightly on TMP/SMX to 1.51.  Potassium is up to 4.7. Her TMP/SMX regimen remains appropriate for her renal function.  Plan: Continue Trimethoprim/Sulfamethoxazole '320mg'$  IV q6h. (14 mg/kg/day of TMP) Follow renal function at least every q48h Monitor electrolytes.  Will leave sticky note to consider decreasing her potassium supplement.  Height: '5\' 4"'$  (162.6 cm) Weight: 197 lb 8 oz (89.6 kg) IBW/kg (Calculated) : 54.7  Temp (24hrs), Avg:98 F (36.7 C), Min:97.4 F (36.3 C), Max:98.5 F (36.9 C)   Recent Labs Lab 11/03/15 0235  11/05/15 0520 11/06/15 0500 11/07/15 0331 11/08/15 0346 11/09/15 0420  WBC 8.9  < > 4.3 6.3 5.3 6.3 6.2  CREATININE 1.33*  < > 1.35* 1.35* 1.26* 1.58* 1.51*  LATICACIDVEN 1.8  --   --   --   --   --   --   < > = values in this interval not displayed.  Estimated Creatinine Clearance: 46.7 mL/min (by C-G formula based on SCr of 1.51 mg/dL (H)).    Allergies  Allergen Reactions  . Lexapro [Escitalopram] Itching  . Losartan   . Spironolactone     rash  . Clonidine Derivatives Palpitations   Antimicrobials this admission:  Vanco 10/17 x1 (750 mg); 10/19 x 1 (1g) Zosyn 10/17 >>10/22 Cipro 10/13 >> 10/16 Flagyl 10/13 >> 10/16 Bactrim 10/20 >>   Microbiology results:  10/14 Cdiff >> negative 10/16 Quantiferon Gold >> indeterminate so PPD placed 10/17 - read as negative 10/17 BCx: neg 10/17 MRSA PCR: neg 10/19 BCx2: neg 10/19 Cdif: neg x2 10/20 HIV: pos 10/21 HIV RNA: ip 10/21 CD4: <10 10/21 HLA: ip 10/21 Resp PCR: neg  Thank you for allowing pharmacy to be a part of this patient's care.  Legrand Como, Pharm.D., BCPS, AAHIVP Clinical Pharmacist Phone: [phone] or [phone] Pager:  [phone] 11/09/2015, 2:32 PM

## 2015-11-09 NOTE — Progress Notes (Signed)
[facility] Progress Note  Chief Complaint: diarrhea  Subjective  History:  Minimal diarrhea, no abdominal pain. Breathing improved, now off Bipap. Appetite fair  ROS: Cardiovascular:  no chest pain   Objective:  Med list reviewed  Vital signs in last 24 hrs: Vitals:   11/09/15 1426 11/09/15 1651  BP:  (!) 100/58  Pulse: 73   Resp: 19   Temp:  98.6 F (37 C)    Physical Exam   HEENT: sclera anicteric, oral mucosa moist without lesions  Neck: supple, no thyromegaly, JVD or lymphadenopathy  Cardiac: RRR without murmurs, S1S2 heard, no peripheral edema  Pulm: clear to auscultation bilaterally, normal RR and effort noted  Abdomen: soft, no tenderness, with active bowel sounds. No guarding or palpable hepatosplenomegaly  Skin; warm and dry, no jaundice or rash  Recent Labs:   Recent Labs Lab 11/07/15 0331 11/08/15 0346 11/09/15 0420  WBC 5.3 6.3 6.2  HGB 8.5* 8.5* 8.7*  HCT 25.4* 25.4* 25.6*  PLT 129* 179 221    Recent Labs Lab 11/05/15 0520  11/09/15 0420  NA 136  < > 128*  K 3.7  < > 4.7  CL 100*  < > 95*  CO2 27  < > 21*  BUN 17  < > 19  ALBUMIN 2.4*  --   --   ALKPHOS 89  --   --   ALT 15  --   --   AST 22  --   --   GLUCOSE 100*  < > 133*  < > = values in this interval not displayed. No results for input(s): INR in the last 168 hours.  @ASSESSMENTPLANBEGIN @ Assessment:  Chronic diarrhea HIV/AIDS and PCP PNA   Plan: The immune suppression is most likely causing the ileo-colonic findings on colonoscopy in July this year.  Expected to improve with eventual increasing CD4 on HAART. Symptomatic treatment with imodium or , if needed, lomotil.  Follow up with Dr Ardis Hughs, her primary GI doctor, in 6-8 weeks after discharge. Signing off - call as needed.  Nelida Meuse III Pager [phone] Mon-Fri 8a-5p [phone] after 5p, weekends, holidays

## 2015-11-09 NOTE — Progress Notes (Signed)
MD paged to change potassium to tablet so that patient able to tolerate better.

## 2015-11-09 NOTE — Progress Notes (Signed)
[facility] PROGRESS NOTE  Nancy Acevedo URK:[id number] DOB: 30-Jun-1960 DOA: 11/17/2015 PCP: Binnie Rail, MD  Interim summary and HPI 55 y.o. female with medical history significant for hypertension, chronic kidney disease stage II, OSA, GERD, and recent diagnosis of Crohn's colitis managed with prednisone who presented to the [facility] emergency department with 2-3 days of fevers, nausea, vomiting, and nonbloody diarrhea. Patient reports the insidious development of abdominal pain approximately 6 months ago and underwent a colonoscopy in July of this year with a large ulcer at the terminal ileum and several punctate deep ulcers in the right colon, with pathology consistent with Crohn's. She has been taking daily prednisone since that time, but unfortunately developed severe mid abdominal pain which rapidly progressed to include fevers, nausea, nonbloody vomiting, and nonbloody diarrhea. She describes her abdominal pain as severe, cramping in character, localized to the lower abdomen, and associated with loss of appetite. She denies any chest pain, palpitations, dyspnea, lower extremity edema, rash, or wound.  On 10/20 her resp status decompensated and patient was moved to ICU. Work up demonstrated bilateral infiltrates and ground glass opacity. 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 Patient is now treated with bactrim IV and steroids, ID is on board. Patient resp status stabilizes with the use of BIPAP and didn't require intubation. On 10/23 was move to stepdown and back to [facility] service.   Assessment/Plan: Sepsis: due to AIDS/PCP PNA ID on board, will follow rec's -patient CD4 count < 10 -treating her with IV bactrim and steroids -also started on Tivicay and Descovy -HLA [id number] and viral load reflex pending -Repeat blood cultures w/o growth  -will continue supportive care -currently afebrile and improving  Acute resp failure with hypoxia: due to PCP  PNA, ARDS and ALI. 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 -C diff negative  -Will follow GI rec's -will need repeat colonoscopy once HIV is well controlled to reevaluate diagnosis of IBD vs GI inflammation from HIV. -CMV ordered and pending   RLL infiltrate/Ground glass opacities -HCAP vs pulmonary edema vs PCP PNA -procalcitonin 1.19 and trending down  -continue IV bactrim as per ID rec's -given diagnosis of HIV and super low CD 4 count, will treat for PCP; other antibiotics discontinued  -follow blood culture, neg up to date  Chest pain/Elevated troponin -demand ischemia in setting of sepsis -with recent work up during this year, 02/11/15 cardiac cath--normal coronaries -no signs of acute ischemia on EKG -Echocardiogram--EF 55-65%, no WMA, trivial MR/TR  Indeterminant QuanteriFERON PPD was placed so far negative  -checking for latent TB for remicade -NO clinical suspicion for active pulm TB at this moment  Acute on chronic kidney disease: stage 2 at baseline -Baseline creatinine 1-1.2 -currently 1.5 -will monitor trend  -minimize nephrotoxic agents and follow medication adjustment per pharmacy   Hypertension -Continue amlodipine and metoprolol tartrate -BP is now stable -pt endorsed poor compliance with meds at home missing 2-3 days per week approx.  Diabetes mellitus type 2 - stable -10/02/2015 hemoglobin A1c 6.6 -Continue  NovoLog  sliding scale  -CBG's elevated due to steroids  GERD -10/22/14--EGD--The esophageal mucosa was abnormal appearing throughout but mainly in the proximal 1/2 of the esophagus. -will continue PPI  Thrombocytopenia on admission 111 -likely due to sepsis -currently 221 -will monitor trend  Diarrhea: -due to chron's  -will add florastor -per GI, ok to use loperamide or lomotil if needed  -will replete electrolytes as needed -neg C. Diff  Hyponatremia, hypokalemia -due to ongoing diarrhea and for her hyponatremia PCP PNA also contributing -will replete electrolytes and follow trend.  Anemia -  -AOCD  -transfused 2 units PRBCs 11/01/15 -Hgb stable -no signs of bleeding appreciated  Code Status: Full Family Communication: no family at bedside  Disposition Plan: remains in stepdown; continue current treatment for AIDS/PCP, continue BIPAP QHS, follow clinical response, gentle diuresis and intermittent CXR to follow pulmonary improvement. Will follow rec's from GI and ID services.   Consultants:  GI  PCCM  ID  Procedures:  See below for x-ray reports   Antibiotics: Antibiotics Given (last 72 hours)    Date/Time Action Medication Dose Rate   11/07/15 0259 Given   sulfamethoxazole-trimethoprim (BACTRIM) 320 mg of trimethoprim in dextrose 5 % 500 mL IVPB 320 mg of trimethoprim 346.7 mL/hr   11/07/15 0548 Given   piperacillin-tazobactam (ZOSYN) IVPB 3.375 g 3.375 g 12.5 mL/hr   11/07/15 1131 Given   sulfamethoxazole-trimethoprim (BACTRIM) 320 mg of trimethoprim in dextrose 5 % 500 mL IVPB 320 mg of trimethoprim 346.7 mL/hr   11/07/15 1430 Given   piperacillin-tazobactam (ZOSYN) IVPB 3.375 g 3.375 g 12.5 mL/hr   11/07/15 1613 Given   sulfamethoxazole-trimethoprim (BACTRIM) 320 mg of trimethoprim in dextrose 5 % 500 mL IVPB 320 mg of trimethoprim 346.7 mL/hr   11/07/15 1835 Given   emtricitabine-tenofovir AF (DESCOVY) 200-25 MG per tablet 1 tablet 1 tablet     11/07/15 1836 Given   dolutegravir (TIVICAY) tablet 50 mg 50 mg    11/07/15 2059 Given   sulfamethoxazole-trimethoprim (BACTRIM) 320 mg of trimethoprim in dextrose 5 % 500 mL IVPB 320 mg of trimethoprim 346.7 mL/hr   11/07/15 2100 Given   piperacillin-tazobactam (ZOSYN) IVPB 3.375 g 3.375 g 12.5 mL/hr   11/08/15 0259 Given   sulfamethoxazole-trimethoprim (BACTRIM) 320 mg of trimethoprim in dextrose 5 % 500 mL IVPB 320 mg of trimethoprim 346.7 mL/hr   11/08/15 7353 Given   piperacillin-tazobactam (ZOSYN) IVPB 3.375 g 3.375 g 12.5 mL/hr   11/08/15 2992 Given   sulfamethoxazole-trimethoprim (BACTRIM) 320 mg of trimethoprim in dextrose 5 % 500 mL IVPB 320 mg of trimethoprim 346.7 mL/hr   11/08/15 1003 Given   dolutegravir (TIVICAY) tablet 50 mg 50 mg    11/08/15 1003 Given   emtricitabine-tenofovir AF (DESCOVY) 200-25 MG per tablet 1 tablet 1 tablet    11/08/15 1619 Given   sulfamethoxazole-trimethoprim (BACTRIM) 320 mg of trimethoprim in dextrose 5 % 500 mL IVPB 320 mg of trimethoprim 346.7 mL/hr   11/08/15 2107 Given   sulfamethoxazole-trimethoprim (BACTRIM) 320 mg of trimethoprim in dextrose 5 % 500 mL IVPB 320 mg of trimethoprim 346.7 mL/hr   11/09/15 0108 Given   sulfamethoxazole-trimethoprim (BACTRIM) 320 mg of trimethoprim in dextrose 5 % 500 mL IVPB 320 mg of trimethoprim 346.7 mL/hr   11/09/15 1058 Given   sulfamethoxazole-trimethoprim (BACTRIM) 320 mg of trimethoprim in dextrose 5 % 500 mL IVPB 320 mg of trimethoprim 346.7 mL/hr   11/09/15 1058 Given   dolutegravir (TIVICAY) tablet 50 mg 50 mg    11/09/15 1058 Given  emtricitabine-tenofovir AF (DESCOVY) 200-25 MG per tablet 1 tablet 1 tablet    11/09/15 1617 Given   sulfamethoxazole-trimethoprim (BACTRIM) 320 mg of trimethoprim in dextrose 5 % 500 mL IVPB 320 mg of trimethoprim 346.7 mL/hr   11/09/15 2137 Given   sulfamethoxazole-trimethoprim (BACTRIM) 320 mg of trimethoprim in dextrose 5 % 500 mL IVPB 320 mg of trimethoprim  346.7 mL/hr     Anti-infectives    Start     Dose/Rate Route Frequency Ordered Stop   11/07/15 1700  dolutegravir (TIVICAY) tablet 50 mg     50 mg Oral Daily 11/07/15 1556     11/07/15 1700  emtricitabine-tenofovir AF (DESCOVY) 200-25 MG per tablet 1 tablet     1 tablet Oral Daily 11/07/15 1556     11/06/15 1400  sulfamethoxazole-trimethoprim (BACTRIM) 320 mg of trimethoprim in dextrose 5 % 500 mL IVPB  Status:  Discontinued     320 mg of trimethoprim 346.7 mL/hr over 90 Minutes Intravenous Every 6 hours 11/06/15 1305 11/06/15 1310   11/06/15 1400  sulfamethoxazole-trimethoprim (BACTRIM) 320 mg of trimethoprim in dextrose 5 % 500 mL IVPB  Status:  Discontinued     320 mg of trimethoprim 346.7 mL/hr over 90 Minutes Intravenous Every 8 hours 11/06/15 1310 11/06/15 1312   11/06/15 1400  sulfamethoxazole-trimethoprim (BACTRIM) 320 mg of trimethoprim in dextrose 5 % 500 mL IVPB     320 mg of trimethoprim 346.7 mL/hr over 90 Minutes Intravenous Every 6 hours 11/06/15 1312     11/05/15 1530  vancomycin (VANCOCIN) IVPB 1000 mg/200 mL premix     1,000 mg 200 mL/hr over 60 Minutes Intravenous  Once 11/05/15 1456 11/05/15 1710   11/03/15 0600  vancomycin (VANCOCIN) IVPB 750 mg/150 ml premix  Status:  Discontinued     750 mg 150 mL/hr over 60 Minutes Intravenous Every 12 hours 11/03/15 0550 11/03/15 1350   11/03/15 0600  piperacillin-tazobactam (ZOSYN) IVPB 3.375 g  Status:  Discontinued     3.375 g 12.5 mL/hr over 240 Minutes Intravenous Every 8 hours 11/03/15 0550 11/08/15 1020   11/02/15 2000  ciprofloxacin (CIPRO) tablet 500 mg  Status:  Discontinued     500 mg Oral 2 times daily 11/02/15 0936 11/03/15 0536   11/02/15 1400  metroNIDAZOLE (FLAGYL) tablet 500 mg  Status:  Discontinued     500 mg Oral Every 8 hours 11/02/15 0936 11/03/15 0536   10/31/15 0800  ciprofloxacin (CIPRO) IVPB 400 mg  Status:  Discontinued     400 mg 200 mL/hr over 60 Minutes Intravenous Every 12 hours 10/25/2015 2308  11/02/15 0936   10/31/15 0300  metroNIDAZOLE (FLAGYL) IVPB 500 mg  Status:  Discontinued     500 mg 100 mL/hr over 60 Minutes Intravenous Every 8 hours 11/03/2015 2239 11/02/15 0936   11/08/2015 1915  ciprofloxacin (CIPRO) IVPB 400 mg     400 mg 200 mL/hr over 60 Minutes Intravenous  Once 10/28/2015 1909 10/21/2015 2054   11/03/2015 1915  metroNIDAZOLE (FLAGYL) IVPB 500 mg     500 mg 100 mL/hr over 60 Minutes Intravenous  Once 11/06/2015 1909 11/11/2015 2047         HPI/Subjective: Afebrile currently, feeling better and breathing easier today. Patient reports still some intermittent abd pain  and continue to have diarrhea   Objective: Vitals:   11/09/15 2000 11/09/15 2004  BP: 118/78 118/78  Pulse: 76 78  Resp: (!) 32   Temp:      Intake/Output Summary (Last 24 hours) at 11/09/15  2107 Last data filed at 11/09/15 1900  Gross per 24 hour  Intake             1500 ml  Output             2602 ml  Net            -1102 ml   Filed Weights   11/07/15 0331 11/08/15 0354 11/09/15 0500  Weight: 88.4 kg (194 lb 14.2 oz) 87.5 kg (192 lb 14.4 oz) 89.6 kg (197 lb 8 oz)    Exam:   General:  In no distress, denies CP. Afebrile currently and endorsing feeling better overall.  Cardiovascular: S1 and S2, no rubs, no gallops, no JVD  Respiratory: no wheezing, improved air movement, fine crackles at bases bilaterally  Abdomen: positive BS, diffuse mil tenderness with palpation, no guarding, no distension   Musculoskeletal: trace edema bilaterally, no cyanosis, no clubbing   Data Reviewed: Basic Metabolic Panel:  Recent Labs Lab 11/04/15 0413 11/05/15 0520 11/06/15 0500 11/07/15 0331 11/08/15 0346 11/09/15 0420  NA 134* 136 134* 132* 131* 128*  K 3.3* 3.7 3.1* 3.3* 4.2 4.7  CL 98* 100* 101 99* 97* 95*  CO2 '26 27 25 25 24 '$ 21*  GLUCOSE 142* 100* 81 182* 114* 133*  BUN '17 17 18 18 18 19  '$ CREATININE 1.53* 1.35* 1.35* 1.26* 1.58* 1.51*  CALCIUM 8.3* 8.2* 7.7* 7.5* 7.6* 7.7*  MG 1.9  --    --   --  2.0 1.9  PHOS  --   --   --   --  3.1 3.1   Liver Function Tests:  Recent Labs Lab 11/03/15 0235 11/05/15 0520  AST 52* 22  ALT 21 15  ALKPHOS 161* 89  BILITOT 0.8 1.1  PROT 5.3* 5.5*  ALBUMIN 2.5* 2.4*   CBC:  Recent Labs Lab 11/03/15 0235  11/05/15 0520 11/06/15 0500 11/07/15 0331 11/08/15 0346 11/09/15 0420  WBC 8.9  < > 4.3 6.3 5.3 6.3 6.2  NEUTROABS 8.2*  --  4.0 5.9  --  6.1 6.0  HGB 9.8*  < > 9.7* 9.2* 8.5* 8.5* 8.7*  HCT 29.5*  < > 28.9* 28.3* 25.4* 25.4* 25.6*  MCV 87.0  < > 86.3 86.8 85.2 85.2 84.2  PLT 140*  < > 127* 140* 129* 179 221  < > = values in this interval not displayed. Cardiac Enzymes: No results for input(s): CKTOTAL, CKMB, CKMBINDEX, TROPONINI in the last 168 hours. BNP (last 3 results)  Recent Labs  10/31/15 1516 11/03/15 1430  BNP 299.7* 143.2*    ProBNP (last 3 results) No results for input(s): PROBNP in the last 8760 hours.  CBG:  Recent Labs Lab 11/08/15 1608 11/08/15 2217 11/09/15 0822 11/09/15 1252 11/09/15 1650  GLUCAP 100* 196* 123* 201* 146*    Recent Results (from the past 240 hour(s))  C difficile quick screen w PCR reflex     Status: None   Collection Time: 10/31/15 10:00 AM  Result Value Ref Range Status   C Diff antigen NEGATIVE NEGATIVE Final   C Diff toxin NEGATIVE NEGATIVE Final   C Diff interpretation No C. difficile detected.  Final  Culture, blood (routine x 2)     Status: None   Collection Time: 11/03/15  2:24 AM  Result Value Ref Range Status   Specimen Description BLOOD LEFT ANTECUBITAL  Final   Special Requests BOTTLES DRAWN AEROBIC AND ANAEROBIC 5CC  Final   Culture NO GROWTH 5 DAYS  Final   Report Status 11/08/2015 FINAL  Final  Culture, blood (routine x 2)     Status: None   Collection Time: 11/03/15  2:30 AM  Result Value Ref Range Status   Specimen Description BLOOD LEFT HAND  Final   Special Requests BOTTLES DRAWN AEROBIC AND ANAEROBIC 5CC  Final   Culture NO GROWTH 5 DAYS   Final   Report Status 11/08/2015 FINAL  Final  MRSA PCR Screening     Status: None   Collection Time: 11/03/15  5:09 AM  Result Value Ref Range Status   MRSA by PCR NEGATIVE NEGATIVE Final    Comment:        The GeneXpert MRSA Assay (FDA approved for NASAL specimens only), is one component of a comprehensive MRSA colonization surveillance program. It is not intended to diagnose MRSA infection nor to guide or monitor treatment for MRSA infections.   Culture, blood (routine x 2)     Status: None (Preliminary result)   Collection Time: 11/05/15  9:50 AM  Result Value Ref Range Status   Specimen Description BLOOD LEFT HAND  Final   Special Requests BOTTLES DRAWN AEROBIC ONLY  5CC  Final   Culture NO GROWTH 4 DAYS  Final   Report Status PENDING  Incomplete  Culture, blood (routine x 2)     Status: None (Preliminary result)   Collection Time: 11/05/15  9:58 AM  Result Value Ref Range Status   Specimen Description BLOOD LEFT WRIST  Final   Special Requests BOTTLES DRAWN AEROBIC ONLY  5CC  Final   Culture NO GROWTH 4 DAYS  Final   Report Status PENDING  Incomplete  C difficile quick scan w PCR reflex     Status: None   Collection Time: 11/05/15 11:05 AM  Result Value Ref Range Status   C Diff antigen NEGATIVE NEGATIVE Final   C Diff toxin NEGATIVE NEGATIVE Final   C Diff interpretation No C. difficile detected.  Final  Respiratory Panel by PCR     Status: None   Collection Time: 11/07/15 11:47 AM  Result Value Ref Range Status   Adenovirus NOT DETECTED NOT DETECTED Final   Coronavirus 229E NOT DETECTED NOT DETECTED Final   Coronavirus HKU1 NOT DETECTED NOT DETECTED Final   Coronavirus NL63 NOT DETECTED NOT DETECTED Final   Coronavirus OC43 NOT DETECTED NOT DETECTED Final   Metapneumovirus NOT DETECTED NOT DETECTED Final   Rhinovirus / Enterovirus NOT DETECTED NOT DETECTED Final   Influenza A NOT DETECTED NOT DETECTED Final   Influenza B NOT DETECTED NOT DETECTED Final    Parainfluenza Virus 1 NOT DETECTED NOT DETECTED Final   Parainfluenza Virus 2 NOT DETECTED NOT DETECTED Final   Parainfluenza Virus 3 NOT DETECTED NOT DETECTED Final   Parainfluenza Virus 4 NOT DETECTED NOT DETECTED Final   Respiratory Syncytial Virus NOT DETECTED NOT DETECTED Final   Bordetella pertussis NOT DETECTED NOT DETECTED Final   Chlamydophila pneumoniae NOT DETECTED NOT DETECTED Final   Mycoplasma pneumoniae NOT DETECTED NOT DETECTED Final     Studies: Dg Chest Port 1 View  Result Date: 11/08/2015 CLINICAL DATA:  Respiratory failure EXAM: PORTABLE CHEST 1 VIEW COMPARISON:  11/07/2015 FINDINGS: Cardiac shadow is stable. Right PICC line is again noted. Diffuse bilateral infiltrates are again seen and stable. No new focal abnormality is noted. IMPRESSION: No significant interval change. Electronically Signed   By: Inez Catalina M.D.   On: 11/08/2015 08:54    Scheduled Meds: . amLODipine  10 mg Oral Daily  . dolutegravir  50 mg Oral Daily  . emtricitabine-tenofovir AF  1 tablet Oral Daily  . enoxaparin (LOVENOX) injection  40 mg Subcutaneous Q24H  . furosemide  40 mg Intravenous Daily  . insulin aspart  0-5 Units Subcutaneous QHS  . insulin aspart  0-9 Units Subcutaneous TID WC  . methylPREDNISolone (SOLU-MEDROL) injection  40 mg Intravenous Q12H  . metoprolol tartrate  50 mg Oral BID  . [START ON 11/10/2015] potassium chloride  20 mEq Oral Daily  . sodium chloride flush  10-40 mL Intracatheter Q12H  . sodium chloride flush  3 mL Intravenous Q12H  . sulfamethoxazole-trimethoprim  320 mg of trimethoprim Intravenous Q6H   Continuous Infusions:     PCP (pneumocystis jiroveci pneumonia) (HCC)   Diabetes mellitus, type 2 (HCC)   OSA (obstructive sleep apnea)   Hypokalemia   Gastroesophageal reflux disease with esophagitis   Crohn's disease of both small and large intestine with fistula ([place])   Essential hypertension, benign   Normocytic anemia   Thrombocytopenia (HCC)   CKD  (chronic kidney disease), stage II   Sepsis, unspecified organism ([place])   Acute respiratory failure with hypoxemia (HCC)   Pressure injury of skin   AIDS ([place])   Hypoxia    Time spent:30 minutes    Barton Dubois  [facility] Pager [phone]. If 7PM-7AM, please contact night-coverage at [email or web address], password [facility] 11/09/2015, 9:07 PM  LOS: 10 days

## 2015-11-09 NOTE — Progress Notes (Signed)
Subjective:  She states she is starting to feel better but still has some discomfort with deep inspiration   Antibiotics:  Anti-infectives    Start     Dose/Rate Route Frequency Ordered Stop   11/07/15 1700  dolutegravir (TIVICAY) tablet 50 mg     50 mg Oral Daily 11/07/15 1556     11/07/15 1700  emtricitabine-tenofovir AF (DESCOVY) 200-25 MG per tablet 1 tablet     1 tablet Oral Daily 11/07/15 1556     11/06/15 1400  sulfamethoxazole-trimethoprim (BACTRIM) 320 mg of trimethoprim in dextrose 5 % 500 mL IVPB  Status:  Discontinued     320 mg of trimethoprim 346.7 mL/hr over 90 Minutes Intravenous Every 6 hours 11/06/15 1305 11/06/15 1310   11/06/15 1400  sulfamethoxazole-trimethoprim (BACTRIM) 320 mg of trimethoprim in dextrose 5 % 500 mL IVPB  Status:  Discontinued     320 mg of trimethoprim 346.7 mL/hr over 90 Minutes Intravenous Every 8 hours 11/06/15 1310 11/06/15 1312   11/06/15 1400  sulfamethoxazole-trimethoprim (BACTRIM) 320 mg of trimethoprim in dextrose 5 % 500 mL IVPB     320 mg of trimethoprim 346.7 mL/hr over 90 Minutes Intravenous Every 6 hours 11/06/15 1312     11/05/15 1530  vancomycin (VANCOCIN) IVPB 1000 mg/200 mL premix     1,000 mg 200 mL/hr over 60 Minutes Intravenous  Once 11/05/15 1456 11/05/15 1710   11/03/15 0600  vancomycin (VANCOCIN) IVPB 750 mg/150 ml premix  Status:  Discontinued     750 mg 150 mL/hr over 60 Minutes Intravenous Every 12 hours 11/03/15 0550 11/03/15 1350   11/03/15 0600  piperacillin-tazobactam (ZOSYN) IVPB 3.375 g  Status:  Discontinued     3.375 g 12.5 mL/hr over 240 Minutes Intravenous Every 8 hours 11/03/15 0550 11/08/15 1020   11/02/15 2000  ciprofloxacin (CIPRO) tablet 500 mg  Status:  Discontinued     500 mg Oral 2 times daily 11/02/15 0936 11/03/15 0536   11/02/15 1400  metroNIDAZOLE (FLAGYL) tablet 500 mg  Status:  Discontinued     500 mg Oral Every 8 hours 11/02/15 0936 11/03/15 0536   10/31/15 0800  ciprofloxacin  (CIPRO) IVPB 400 mg  Status:  Discontinued     400 mg 200 mL/hr over 60 Minutes Intravenous Every 12 hours 11/03/2015 2308 11/02/15 0936   10/31/15 0300  metroNIDAZOLE (FLAGYL) IVPB 500 mg  Status:  Discontinued     500 mg 100 mL/hr over 60 Minutes Intravenous Every 8 hours 11/02/2015 2239 11/02/15 0936   11/06/2015 1915  ciprofloxacin (CIPRO) IVPB 400 mg     400 mg 200 mL/hr over 60 Minutes Intravenous  Once 10/31/2015 1909 11/05/2015 2054   11/13/2015 1915  metroNIDAZOLE (FLAGYL) IVPB 500 mg     500 mg 100 mL/hr over 60 Minutes Intravenous  Once 11/07/2015 1909 11/14/2015 2047      Medications: Scheduled Meds: . amLODipine  10 mg Oral Daily  . dolutegravir  50 mg Oral Daily  . emtricitabine-tenofovir AF  1 tablet Oral Daily  . enoxaparin (LOVENOX) injection  40 mg Subcutaneous Q24H  . furosemide  40 mg Intravenous Daily  . insulin aspart  0-5 Units Subcutaneous QHS  . insulin aspart  0-9 Units Subcutaneous TID WC  . methylPREDNISolone (SOLU-MEDROL) injection  40 mg Intravenous Q12H  . metoprolol tartrate  50 mg Oral BID  . [START ON 11/10/2015] potassium chloride  20 mEq Oral Daily  . sodium chloride flush  10-40 mL  Intracatheter Q12H  . sodium chloride flush  3 mL Intravenous Q12H  . sulfamethoxazole-trimethoprim  320 mg of trimethoprim Intravenous Q6H    Objective: Weight change: 4 lb 9.6 oz (2.085 kg)  Intake/Output Summary (Last 24 hours) at 11/09/15 2204 Last data filed at 11/09/15 1900  Gross per 24 hour  Intake             1500 ml  Output             2602 ml  Net            -1102 ml   Blood pressure 121/77, pulse 72, temperature 98.5 F (36.9 C), temperature source Oral, resp. rate (!) 33, height '5\' 4"'$  (1.626 m), weight 197 lb 8 oz (89.6 kg), SpO2 92 %. Temp:  [97.4 F (36.3 C)-98.6 F (37 C)] 98.5 F (36.9 C) (10/23 2123) Pulse Rate:  [35-136] 72 (10/23 2123) Resp:  [19-34] 33 (10/23 2123) BP: (100-121)/(58-80) 121/77 (10/23 2123) SpO2:  [80 %-99 %] 92 % (10/23  2123) FiO2 (%):  [40 %] 40 % (10/23 0434) Weight:  [197 lb 8 oz (89.6 kg)] 197 lb 8 oz (89.6 kg) (10/23 0500)  Physical Exam: General: Alert and awake, oriented x3, Wearing BIPAP HEENT: anicteric sclera,  EOMI, oropharynx clear and without exudate Cardiovascular: regular rate, normal r,  no murmur rubs or gallops Pulmonary:  rhonchi anteriorly, difficulty with deep breath which causes coughing Gastrointestinal l:  obese soft nontender, nondistended, normal bowel sounds, Skin, soft tissue: no rashes Neuro: nonfocal, strength and sensation intact CBC:  CBC Latest Ref Rng & Units 11/09/2015 11/08/2015 11/07/2015  WBC 4.0 - 10.5 K/uL 6.2 6.3 5.3  Hemoglobin 12.0 - 15.0 g/dL 8.7(L) 8.5(L) 8.5(L)  Hematocrit 36.0 - 46.0 % 25.6(L) 25.4(L) 25.4(L)  Platelets 150 - 400 K/uL 221 179 129(L)      BMET  Recent Labs  11/08/15 0346 11/09/15 0420  NA 131* 128*  K 4.2 4.7  CL 97* 95*  CO2 24 21*  GLUCOSE 114* 133*  BUN 18 19  CREATININE 1.58* 1.51*  CALCIUM 7.6* 7.7*   Sedimentation Rate  Recent Labs  11/07/15 0800  ESRSEDRATE 80*   Micro Results: Recent Results (from the past 720 hour(s))  C difficile quick screen w PCR reflex     Status: None   Collection Time: 10/31/15 10:00 AM  Result Value Ref Range Status   C Diff antigen NEGATIVE NEGATIVE Final   C Diff toxin NEGATIVE NEGATIVE Final   C Diff interpretation No C. difficile detected.  Final  Culture, blood (routine x 2)     Status: None   Collection Time: 11/03/15  2:24 AM  Result Value Ref Range Status   Specimen Description BLOOD LEFT ANTECUBITAL  Final   Special Requests BOTTLES DRAWN AEROBIC AND ANAEROBIC 5CC  Final   Culture NO GROWTH 5 DAYS  Final   Report Status 11/08/2015 FINAL  Final  Culture, blood (routine x 2)     Status: None   Collection Time: 11/03/15  2:30 AM  Result Value Ref Range Status   Specimen Description BLOOD LEFT HAND  Final   Special Requests BOTTLES DRAWN AEROBIC AND ANAEROBIC 5CC  Final    Culture NO GROWTH 5 DAYS  Final   Report Status 11/08/2015 FINAL  Final  MRSA PCR Screening     Status: None   Collection Time: 11/03/15  5:09 AM  Result Value Ref Range Status   MRSA by PCR NEGATIVE NEGATIVE Final  Comment:        The GeneXpert MRSA Assay (FDA approved for NASAL specimens only), is one component of a comprehensive MRSA colonization surveillance program. It is not intended to diagnose MRSA infection nor to guide or monitor treatment for MRSA infections.   Culture, blood (routine x 2)     Status: None (Preliminary result)   Collection Time: 11/05/15  9:50 AM  Result Value Ref Range Status   Specimen Description BLOOD LEFT HAND  Final   Special Requests BOTTLES DRAWN AEROBIC ONLY  5CC  Final   Culture NO GROWTH 4 DAYS  Final   Report Status PENDING  Incomplete  Culture, blood (routine x 2)     Status: None (Preliminary result)   Collection Time: 11/05/15  9:58 AM  Result Value Ref Range Status   Specimen Description BLOOD LEFT WRIST  Final   Special Requests BOTTLES DRAWN AEROBIC ONLY  5CC  Final   Culture NO GROWTH 4 DAYS  Final   Report Status PENDING  Incomplete  C difficile quick scan w PCR reflex     Status: None   Collection Time: 11/05/15 11:05 AM  Result Value Ref Range Status   C Diff antigen NEGATIVE NEGATIVE Final   C Diff toxin NEGATIVE NEGATIVE Final   C Diff interpretation No C. difficile detected.  Final  Respiratory Panel by PCR     Status: None   Collection Time: 11/07/15 11:47 AM  Result Value Ref Range Status   Adenovirus NOT DETECTED NOT DETECTED Final   Coronavirus 229E NOT DETECTED NOT DETECTED Final   Coronavirus HKU1 NOT DETECTED NOT DETECTED Final   Coronavirus NL63 NOT DETECTED NOT DETECTED Final   Coronavirus OC43 NOT DETECTED NOT DETECTED Final   Metapneumovirus NOT DETECTED NOT DETECTED Final   Rhinovirus / Enterovirus NOT DETECTED NOT DETECTED Final   Influenza A NOT DETECTED NOT DETECTED Final   Influenza B NOT  DETECTED NOT DETECTED Final   Parainfluenza Virus 1 NOT DETECTED NOT DETECTED Final   Parainfluenza Virus 2 NOT DETECTED NOT DETECTED Final   Parainfluenza Virus 3 NOT DETECTED NOT DETECTED Final   Parainfluenza Virus 4 NOT DETECTED NOT DETECTED Final   Respiratory Syncytial Virus NOT DETECTED NOT DETECTED Final   Bordetella pertussis NOT DETECTED NOT DETECTED Final   Chlamydophila pneumoniae NOT DETECTED NOT DETECTED Final   Mycoplasma pneumoniae NOT DETECTED NOT DETECTED Final    Studies/Results: Dg Chest Port 1 View  Result Date: 11/08/2015 CLINICAL DATA:  Respiratory failure EXAM: PORTABLE CHEST 1 VIEW COMPARISON:  11/07/2015 FINDINGS: Cardiac shadow is stable. Right PICC line is again noted. Diffuse bilateral infiltrates are again seen and stable. No new focal abnormality is noted. IMPRESSION: No significant interval change. Electronically Signed   By: Inez Catalina M.D.   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[place]) Active Problems:   Diabetes mellitus, type 2 (HCC)   OSA (obstructive sleep apnea)   Hypokalemia   Gastroesophageal reflux disease with esophagitis   Crohn's disease of both small and large intestine with fistula ([place])   Essential hypertension, benign   Normocytic anemia   Thrombocytopenia (HCC)   CKD (chronic kidney disease), stage II   Sepsis, unspecified organism ([place])   Hypertensive urgency   Crohn's disease of large intestine with fistula ([place])   Lobar pneumonia ([place])   Acute respiratory failure with hypoxemia ([place])   Pressure injury of skin   AIDS ([place])  Hypoxia    Nancy Acevedo is a 55 y.o. 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 She does harbor stigma associated with HIV, she states that she had not been with many partners, " I am not that type of woman". She has never been previously tested for hiv per her recollection. We have informed her that the state health dept will get in contact with her to get contact information.  SHE DOES NOT WANT HER DIAGNOSIS DISCLOSED TO FAMILY MEMBERS  #2 PCP PNA: continue IV Bactrim and steroids  #3 Crohn's disease: defer to gi to see if this still true diagnosis.  hiv can give watery diarrhea but not likely fistula. She would not be a candidate at this stage for biologics if she did have crohn's disease. Would recommend to check for tb exposure when she is immune reconstituted    LOS: 10 days   Nancy Acevedo 11/09/2015, 10:04 PM

## 2015-11-10 DIAGNOSIS — G4733 Obstructive sleep apnea (adult) (pediatric): Secondary | ICD-10-CM

## 2015-11-10 LAB — CBC WITH DIFFERENTIAL/PLATELET
BASOS ABS: 0 10*3/uL (ref 0.0–0.1)
BASOS PCT: 0 %
EOS ABS: 0 10*3/uL (ref 0.0–0.7)
EOS PCT: 0 %
HCT: 25.2 % — ABNORMAL LOW (ref 36.0–46.0)
HEMOGLOBIN: 8.6 g/dL — AB (ref 12.0–15.0)
Lymphocytes Relative: 1 %
Lymphs Abs: 0.1 10*3/uL — ABNORMAL LOW (ref 0.7–4.0)
MCH: 28.4 pg (ref 26.0–34.0)
MCHC: 34.1 g/dL (ref 30.0–36.0)
MCV: 83.2 fL (ref 78.0–100.0)
Monocytes Absolute: 0 10*3/uL — ABNORMAL LOW (ref 0.1–1.0)
Monocytes Relative: 1 %
NEUTROS PCT: 98 %
Neutro Abs: 6 10*3/uL (ref 1.7–7.7)
PLATELETS: 231 10*3/uL (ref 150–400)
RBC: 3.03 MIL/uL — AB (ref 3.87–5.11)
RDW: 15.6 % — ABNORMAL HIGH (ref 11.5–15.5)
WBC: 6.1 10*3/uL (ref 4.0–10.5)

## 2015-11-10 LAB — BASIC METABOLIC PANEL
ANION GAP: 10 (ref 5–15)
BUN: 19 mg/dL (ref 6–20)
CO2: 22 mmol/L (ref 22–32)
Calcium: 7.7 mg/dL — ABNORMAL LOW (ref 8.9–10.3)
Chloride: 92 mmol/L — ABNORMAL LOW (ref 101–111)
Creatinine, Ser: 1.47 mg/dL — ABNORMAL HIGH (ref 0.44–1.00)
GFR, EST AFRICAN AMERICAN: 46 mL/min — AB (ref >60)
GFR, EST NON AFRICAN AMERICAN: 40 mL/min — AB (ref >60)
Glucose, Bld: 218 mg/dL — ABNORMAL HIGH (ref 65–99)
POTASSIUM: 4.7 mmol/L (ref 3.5–5.1)
SODIUM: 124 mmol/L — AB (ref 135–145)

## 2015-11-10 LAB — GLUCOSE, CAPILLARY
GLUCOSE-CAPILLARY: 127 mg/dL — AB (ref 65–99)
GLUCOSE-CAPILLARY: 134 mg/dL — AB (ref 65–99)
GLUCOSE-CAPILLARY: 121 mg/dL — AB (ref 65–99)
Glucose-Capillary: 161 mg/dL — ABNORMAL HIGH (ref 65–99)

## 2015-11-10 LAB — CULTURE, BLOOD (ROUTINE X 2)
Culture: NO GROWTH
Culture: NO GROWTH

## 2015-11-10 LAB — PHOSPHORUS: PHOSPHORUS: 3.7 mg/dL (ref 2.5–4.6)

## 2015-11-10 LAB — MAGNESIUM: MAGNESIUM: 1.8 mg/dL (ref 1.7–2.4)

## 2015-11-10 LAB — CRYPTOCOCCAL ANTIGEN: Crypto Ag: NEGATIVE

## 2015-11-10 MED ORDER — IPRATROPIUM-ALBUTEROL 0.5-2.5 (3) MG/3ML IN SOLN
3.0000 mL | Freq: Three times a day (TID) | RESPIRATORY_TRACT | Status: DC
  Administered 2015-11-10 – 2015-11-27 (×49): 3 mL via RESPIRATORY_TRACT
  Filled 2015-11-10 (×50): qty 3

## 2015-11-10 NOTE — Progress Notes (Addendum)
Subjective:  Requiring slightly less nasal cannula oxygenation. She is still tearful about her diagnosis. She is also mourning the loss of her father who passed on Nov 05, 2022. Only 4 soft stools yesterday.   Antibiotics:  Anti-infectives    Start     Dose/Rate Route Frequency Ordered Stop   11/07/15 1700  dolutegravir (TIVICAY) tablet 50 mg     50 mg Oral Daily 11/07/15 1556     11/07/15 1700  emtricitabine-tenofovir AF (DESCOVY) 200-25 MG per tablet 1 tablet     1 tablet Oral Daily 11/07/15 1556     11/06/15 1400  sulfamethoxazole-trimethoprim (BACTRIM) 320 mg of trimethoprim in dextrose 5 % 500 mL IVPB  Status:  Discontinued     320 mg of trimethoprim 346.7 mL/hr over 90 Minutes Intravenous Every 6 hours 11/06/15 1305 11/06/15 1310   11/06/15 1400  sulfamethoxazole-trimethoprim (BACTRIM) 320 mg of trimethoprim in dextrose 5 % 500 mL IVPB  Status:  Discontinued     320 mg of trimethoprim 346.7 mL/hr over 90 Minutes Intravenous Every 8 hours 11/06/15 1310 11/06/15 1312   11/06/15 1400  sulfamethoxazole-trimethoprim (BACTRIM) 320 mg of trimethoprim in dextrose 5 % 500 mL IVPB     320 mg of trimethoprim 346.7 mL/hr over 90 Minutes Intravenous Every 6 hours 11/06/15 1312     11/05/15 1530  vancomycin (VANCOCIN) IVPB 1000 mg/200 mL premix     1,000 mg 200 mL/hr over 60 Minutes Intravenous  Once 11/05/15 1456 11/05/15 1710   11/03/15 0600  vancomycin (VANCOCIN) IVPB 750 mg/150 ml premix  Status:  Discontinued     750 mg 150 mL/hr over 60 Minutes Intravenous Every 12 hours 11/03/15 0550 11/03/15 1350   11/03/15 0600  piperacillin-tazobactam (ZOSYN) IVPB 3.375 g  Status:  Discontinued     3.375 g 12.5 mL/hr over 240 Minutes Intravenous Every 8 hours 11/03/15 0550 11/08/15 1020   11/02/15 2000  ciprofloxacin (CIPRO) tablet 500 mg  Status:  Discontinued     500 mg Oral 2 times daily 11/02/15 0936 11/03/15 0536   11/02/15 1400  metroNIDAZOLE (FLAGYL) tablet 500 mg  Status:  Discontinued      500 mg Oral Every 8 hours 11/02/15 0936 11/03/15 0536   10/31/15 0800  ciprofloxacin (CIPRO) IVPB 400 mg  Status:  Discontinued     400 mg 200 mL/hr over 60 Minutes Intravenous Every 12 hours 11/04/2015 2308 11/02/15 0936   10/31/15 0300  metroNIDAZOLE (FLAGYL) IVPB 500 mg  Status:  Discontinued     500 mg 100 mL/hr over 60 Minutes Intravenous Every 8 hours 10/31/2015 2239 11/02/15 0936   11/15/2015 1915  ciprofloxacin (CIPRO) IVPB 400 mg     400 mg 200 mL/hr over 60 Minutes Intravenous  Once 10/21/2015 1909 10/18/2015 2054   10/29/2015 1915  metroNIDAZOLE (FLAGYL) IVPB 500 mg     500 mg 100 mL/hr over 60 Minutes Intravenous  Once 11/09/2015 1909 11/07/2015 2047      Medications: Scheduled Meds: . amLODipine  10 mg Oral Daily  . dolutegravir  50 mg Oral Daily  . emtricitabine-tenofovir AF  1 tablet Oral Daily  . enoxaparin (LOVENOX) injection  40 mg Subcutaneous Q24H  . furosemide  40 mg Intravenous Daily  . insulin aspart  0-5 Units Subcutaneous QHS  . insulin aspart  0-9 Units Subcutaneous TID WC  . ipratropium-albuterol  3 mL Nebulization TID  . methylPREDNISolone (SOLU-MEDROL) injection  40 mg Intravenous Q12H  . metoprolol tartrate  50  mg Oral BID  . pantoprazole  40 mg Oral Daily  . potassium chloride  20 mEq Oral Daily  . saccharomyces boulardii  250 mg Oral BID  . sodium chloride flush  10-40 mL Intracatheter Q12H  . sodium chloride flush  3 mL Intravenous Q12H  . sulfamethoxazole-trimethoprim  320 mg of trimethoprim Intravenous Q6H    Objective: Weight change:   Intake/Output Summary (Last 24 hours) at 11/10/15 1405 Last data filed at 11/10/15 1011  Gross per 24 hour  Intake             1320 ml  Output             3800 ml  Net            -2480 ml   Blood pressure 115/74, pulse 72, temperature 98.3 F (36.8 C), temperature source Oral, resp. rate (!) 25, height _0  (1.626 m), weight 197 lb 8 oz (89.6 kg), SpO2 90 %. Temp:  [98.3 F (36.8 C)-98.6 F (37 C)] 98.3 F  (36.8 C) (10/24 1257) Pulse Rate:  [67-78] 72 (10/24 0754) Resp:  [19-33] 25 (10/24 0754) BP: (100-122)/(58-90) 115/74 (10/24 1257) SpO2:  [90 %-100 %] 90 % (10/24 0754) FiO2 (%):  [60 %] 60 % (10/23 2222)  Physical Exam: General: Alert and awake, oriented x3, Wearing BIPAP HEENT: anicteric sclera,  EOMI, oropharynx clear and without exudate Cardiovascular: regular rate, normal r,  no murmur rubs or gallops Pulmonary:  rhonchi anteriorly, difficulty with deep breath which causes coughing Gastrointestinal l:  obese soft nontender, nondistended, normal bowel sounds, Skin, soft tissue: no rashes Neuro: nonfocal, strength and sensation intact CBC:  CBC Latest Ref Rng & Units 11/10/2015 11/09/2015 11/08/2015  WBC 4.0 - 10.5 K/uL 6.1 6.2 6.3  Hemoglobin 12.0 - 15.0 g/dL 8.6(L) 8.7(L) 8.5(L)  Hematocrit 36.0 - 46.0 % 25.2(L) 25.6(L) 25.4(L)  Platelets 150 - 400 K/uL 231 221 179      BMET  Recent Labs  11/09/15 0420 11/10/15 0320  NA 128* 124*  K 4.7 4.7  CL 95* 92*  CO2 21* 22  GLUCOSE 133* 218*  BUN 19 19  CREATININE 1.51* 1.47*  CALCIUM 7.7* 7.7*   Sedimentation Rate No results for input(s): ESRSEDRATE in the last 72 hours. Micro Results: Recent Results (from the past 720 hour(s))  C difficile quick screen w PCR reflex     Status: None   Collection Time: 10/31/15 10:00 AM  Result Value Ref Range Status   C Diff antigen NEGATIVE NEGATIVE Final   C Diff toxin NEGATIVE NEGATIVE Final   C Diff interpretation No C. difficile detected.  Final  Culture, blood (routine x 2)     Status: None   Collection Time: 11/03/15  2:24 AM  Result Value Ref Range Status   Specimen Description BLOOD LEFT ANTECUBITAL  Final   Special Requests BOTTLES DRAWN AEROBIC AND ANAEROBIC 5CC  Final   Culture NO GROWTH 5 DAYS  Final   Report Status 11/08/2015 FINAL  Final  Culture, blood (routine x 2)     Status: None   Collection Time: 11/03/15  2:30 AM  Result Value Ref Range Status    Specimen Description BLOOD LEFT HAND  Final   Special Requests BOTTLES DRAWN AEROBIC AND ANAEROBIC 5CC  Final   Culture NO GROWTH 5 DAYS  Final   Report Status 11/08/2015 FINAL  Final  MRSA PCR Screening     Status: None   Collection Time: 11/03/15  5:09 AM  Result Value Ref Range Status   MRSA by PCR NEGATIVE NEGATIVE Final    Comment:        The GeneXpert MRSA Assay (FDA approved for NASAL specimens only), is one component of a comprehensive MRSA colonization surveillance program. It is not intended to diagnose MRSA infection nor to guide or monitor treatment for MRSA infections.   Culture, blood (routine x 2)     Status: None   Collection Time: 11/05/15  9:50 AM  Result Value Ref Range Status   Specimen Description BLOOD LEFT HAND  Final   Special Requests BOTTLES DRAWN AEROBIC ONLY  5CC  Final   Culture NO GROWTH 5 DAYS  Final   Report Status 11/10/2015 FINAL  Final  Culture, blood (routine x 2)     Status: None   Collection Time: 11/05/15  9:58 AM  Result Value Ref Range Status   Specimen Description BLOOD LEFT WRIST  Final   Special Requests BOTTLES DRAWN AEROBIC ONLY  5CC  Final   Culture NO GROWTH 5 DAYS  Final   Report Status 11/10/2015 FINAL  Final  C difficile quick scan w PCR reflex     Status: None   Collection Time: 11/05/15 11:05 AM  Result Value Ref Range Status   C Diff antigen NEGATIVE NEGATIVE Final   C Diff toxin NEGATIVE NEGATIVE Final   C Diff interpretation No C. difficile detected.  Final  Respiratory Panel by PCR     Status: None   Collection Time: 11/07/15 11:47 AM  Result Value Ref Range Status   Adenovirus NOT DETECTED NOT DETECTED Final   Coronavirus 229E NOT DETECTED NOT DETECTED Final   Coronavirus HKU1 NOT DETECTED NOT DETECTED Final   Coronavirus NL63 NOT DETECTED NOT DETECTED Final   Coronavirus OC43 NOT DETECTED NOT DETECTED Final   Metapneumovirus NOT DETECTED NOT DETECTED Final   Rhinovirus / Enterovirus NOT DETECTED NOT DETECTED  Final   Influenza A NOT DETECTED NOT DETECTED Final   Influenza B NOT DETECTED NOT DETECTED Final   Parainfluenza Virus 1 NOT DETECTED NOT DETECTED Final   Parainfluenza Virus 2 NOT DETECTED NOT DETECTED Final   Parainfluenza Virus 3 NOT DETECTED NOT DETECTED Final   Parainfluenza Virus 4 NOT DETECTED NOT DETECTED Final   Respiratory Syncytial Virus NOT DETECTED NOT DETECTED Final   Bordetella pertussis NOT DETECTED NOT DETECTED Final   Chlamydophila pneumoniae NOT DETECTED NOT DETECTED Final   Mycoplasma pneumoniae NOT DETECTED NOT DETECTED Final    Studies/Results: No results found.    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[place]) Active Problems:   Controlled type 2 diabetes mellitus with stage 2 chronic kidney disease, without long-term current use of insulin (HCC)   OSA (obstructive sleep apnea)   Hypokalemia   Gastroesophageal reflux disease with esophagitis   Crohn's disease of both small and large intestine with fistula (HCC)   Essential hypertension, benign   Normocytic anemia   Thrombocytopenia (HCC)   CKD (chronic kidney disease), stage II   Sepsis, unspecified organism ([place])   Hypertensive urgency   Crohn's disease of large intestine with fistula (HCC)   Lobar pneumonia ([place])   Acute hypoxemic respiratory failure (HCC)   Pressure injury of skin   AIDS ([place])   Hypoxia   Acute renal failure superimposed on stage 2 chronic kidney disease ([place])    Nancy Acevedo is a 55 y.o. female with  female with  Hx of HTN, CKD, Crohn's (though latter was  a recent diagnosis) with entero-entero fistula now found to have newly diagnosed advanced HIV disease/AIDS, CD 4 count < 10,  with likely PCP pneumonia  #1 HIV/AIDS: -- continue with treament for PCP pneumonia with bactrim and steroids -- awaiting HIV viral load reflex genotype, HLA [id number] -- continue on TIVICAY and DESCOVY which will drop her VL rapidly and allow CD4 to rise (she  will need copay cards from [facility] for these meds when she nears DC and we can arrange to fill at [facility] prior to DC (insurance permitting)  #2 oi proph: - will check toxoplasmosis total ab, cryptococcal ab - will start azithromycin 1238m Q wk due to CD 4 count < 50 - presently on bactrim  SHE DOES NOT WANT HER DIAGNOSIS DISCLOSED TO [facility] #2 PCP PNA: continue IV Bactrim and steroids. After 21 days, will need to be on bactrim DS daily dosing  #3 Crohn's disease: defer to gi to see if this still true diagnosis.  hiv can give watery diarrhea but not likely fistula. She would not be a candidate at this stage for biologics if she did have crohn's disease. Would recommend to check for tb exposure when she is immune reconstituted    LOS: 11 days   Hallee Mckenny 11/10/2015, 2:05 PM

## 2015-11-10 NOTE — Progress Notes (Signed)
Patient currently resting on high flow nasal cannula.  Bipap not needed at this time.  Will continue to monitor.

## 2015-11-10 NOTE — Progress Notes (Signed)
   11/10/15 1300  Clinical Encounter Type  Visited With Patient  Visit Type Follow-up  Stress Factors  Patient Stress Factors Loss of control;Family relationships   - Introduced chaplaincy services - Reflected pt. fears concerning reaching out/leaning on friends/family members.  - Pt. reticent to trust.  - Not used to leaning on others.   - Complex family dynamic w/ sister.  - Pt. not used to hospital setting/being sick (newness of environment may induce anxiety).   - God is a source of comfort and meaning for pt. (methodist).  Will follow, as needed.  - Rev. Hassell MDiv ThM

## 2015-11-10 NOTE — Progress Notes (Addendum)
[facility] PROGRESS NOTE  Nancy Acevedo SEG:[id number] DOB: 03-04-1960 DOA: 11/08/2015 PCP: Binnie Rail, MD  Interim summary and HPI 55 y.o. female with medical history significant for hypertension, chronic kidney disease stage II, OSA, GERD, and recent diagnosis of Crohn's colitis managed with prednisone who presented to the [facility] emergency department with 2-3 days of fevers, nausea, vomiting, and nonbloody diarrhea. Patient reports the insidious development of abdominal pain approximately 6 months ago and underwent a colonoscopy in July of this year with a large ulcer at the terminal ileum and several punctate deep ulcers in the right colon, with pathology consistent with Crohn's. She has been taking daily prednisone since that time, but unfortunately developed severe mid abdominal pain which rapidly progressed to include fevers, nausea, nonbloody vomiting, and nonbloody diarrhea. She describes her abdominal pain as severe, cramping in character, localized to the lower abdomen, and associated with loss of appetite. She denies any chest pain, palpitations, dyspnea, lower extremity edema, rash, or wound.  On 10/20 her resp status decompensated and patient was moved to ICU. Work up demonstrated bilateral infiltrates and ground glass opacity. 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 Patient is now treated with bactrim IV and steroids, ID is on board. Patient resp status stabilizes with the use of BIPAP and didn't require intubation. On 10/23 was move to stepdown and back to [facility] service.   Assessment/Plan: Sepsis: due to AIDS/PCP PNA ID on board, will follow rec's -patient CD4 count < 10 -treating her with IV bactrim and steroids -also started on Tivicay and Descovy -HLA [id number] and viral load reflex pending -Repeat blood cultures w/o growth  -will continue supportive care -currently afebrile and slowly improving  Acute resp failure with hypoxia: due  to PCP PNA, ARDS and ALI. -no intubation required -patient now using BIPAP QHS -oxygen supplementation through [place] during the day (high flow oxygen) -tachypnea and mild desat appreciated with minimal exertion (preventing transfer to floor) -will continue IV lasix, with close follow up to electrolytes and renal function -continue abx's and steroids for PCP -will repeat CXR 2 views in am  Crohn's ileocolitis with entero-enterofistula and flare -appreciate GI inputs and rec's  -for now will continue steroids -diet has been advanced to heart healthy/carb modified diet; well tolerated -11/05/2015 CT abdomen and pelvis--long segment of bowel wall thickening with surrounding inflammation from terminal ileum to the sigmoid with 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 -C diff negative  -Will follow GI rec's (at this point, focus on tx for HIV and PCP; outpatient follow up with her primary gastroenterologist (Dr. Ardis Hughs) and at that time repeat colonoscopy once HIV is well controlled to reevaluate diagnosis of IBD vs GI inflammation from HIV). -CMV was ordered and pending   RLL infiltrate/Ground glass opacities -HCAP vs pulmonary edema vs PCP PNA -procalcitonin 1.19 and trending down  -continue IV bactrim as per ID rec's -given diagnosis of HIV and super low CD 4 count, will continue tx for PCP; other antibiotics discontinued  -follow blood cultures, neg up to date  Chest pain/Elevated troponin -demand ischemia in setting of sepsis -with recent work up during this year, 02/11/15 cardiac cath--normal coronaries -no signs of acute ischemia on EKG -Echocardiogram--EF 55-65%, no WMA, trivial MR/TR  Indeterminant QuanteriFERON PPD was placed so far negative  -checking for latent TB for remicade -NO clinical suspicion for active pulm TB at this moment -per DI, consider repeat TB testing once immune system is reconstituted  Acute on chronic kidney  disease: stage 2 at baseline -Baseline creatinine 1-1.2 -currently 1.4 (and trending down) -will monitor trend  -minimize nephrotoxic agents and follow medication adjustment per pharmacy   Hypertension -Continue amlodipine and metoprolol tartrate -BP is now stable -pt endorsed poor compliance with meds at home missing 2-3 days per week approx.  Diabetes mellitus type 2 - stable -10/02/2015 hemoglobin A1c 6.6 -Continue NovoLog sliding scale  -anticipate CBG's elevated due to steroids  GERD -10/22/14--EGD--The esophageal mucosa was abnormal appearing throughout but mainly in the proximal 1/2 of the esophagus. -will continue PPI  Thrombocytopenia on admission 111 -likely due to sepsis -currently 221 -will monitor trend -no signs of bleeding appreciated  Diarrhea: -due to chron's  -will add florastor -per GI, ok to use loperamide or lomotil if needed  -will replete electrolytes as needed -neg C. Diff  Hyponatremia, hypokalemia -due to ongoing diarrhea, and for her hyponatremia PCP PNA also contributing -will replete electrolytes and follow trend.  Anemia -  -AOCD  -transfused 2 units PRBCs 11/01/15 -Hgb stable -no signs of bleeding appreciated  OSA -currently on BIPAP QHS  Code Status: Full Family Communication: no family at bedside  Disposition Plan: remains in stepdown; continue current treatment for AIDS/PCP, continue BIPAP QHS, follow clinical response, gentle diuresis and intermittent CXR to follow pulmonary improvement. Will follow rec's from ID services.   Consultants:  GI (sign off)  PCCM (sign off)  ID  Procedures:  See below for x-ray reports   Antibiotics: Anti-infectives    Start     Dose/Rate Route Frequency Ordered Stop   11/07/15 1700  dolutegravir (TIVICAY) tablet 50 mg     50 mg Oral Daily 11/07/15 1556     11/07/15 1700  emtricitabine-tenofovir AF (DESCOVY) 200-25 MG per tablet 1 tablet     1 tablet Oral Daily 11/07/15 1556      11/06/15 1400  sulfamethoxazole-trimethoprim (BACTRIM) 320 mg of trimethoprim in dextrose 5 % 500 mL IVPB  Status:  Discontinued     320 mg of trimethoprim 346.7 mL/hr over 90 Minutes Intravenous Every 6 hours 11/06/15 1305 11/06/15 1310   11/06/15 1400  sulfamethoxazole-trimethoprim (BACTRIM) 320 mg of trimethoprim in dextrose 5 % 500 mL IVPB  Status:  Discontinued     320 mg of trimethoprim 346.7 mL/hr over 90 Minutes Intravenous Every 8 hours 11/06/15 1310 11/06/15 1312   11/06/15 1400  sulfamethoxazole-trimethoprim (BACTRIM) 320 mg of trimethoprim in dextrose 5 % 500 mL IVPB     320 mg of trimethoprim 346.7 mL/hr over 90 Minutes Intravenous Every 6 hours 11/06/15 1312     11/05/15 1530  vancomycin (VANCOCIN) IVPB 1000 mg/200 mL premix     1,000 mg 200 mL/hr over 60 Minutes Intravenous  Once 11/05/15 1456 11/05/15 1710   11/03/15 0600  vancomycin (VANCOCIN) IVPB 750 mg/150 ml premix  Status:  Discontinued     750 mg 150 mL/hr over 60 Minutes Intravenous Every 12 hours 11/03/15 0550 11/03/15 1350   11/03/15 0600  piperacillin-tazobactam (ZOSYN) IVPB 3.375 g  Status:  Discontinued     3.375 g 12.5 mL/hr over 240 Minutes Intravenous Every 8 hours 11/03/15 0550 11/08/15 1020   11/02/15 2000  ciprofloxacin (CIPRO) tablet 500 mg  Status:  Discontinued     500 mg Oral 2 times daily 11/02/15 0936 11/03/15 0536   11/02/15 1400  metroNIDAZOLE (FLAGYL) tablet 500 mg  Status:  Discontinued     500 mg Oral Every 8 hours 11/02/15 0936 11/03/15 0536  10/31/15 0800  ciprofloxacin (CIPRO) IVPB 400 mg  Status:  Discontinued     400 mg 200 mL/hr over 60 Minutes Intravenous Every 12 hours 10/19/2015 2308 11/02/15 0936   10/31/15 0300  metroNIDAZOLE (FLAGYL) IVPB 500 mg  Status:  Discontinued     500 mg 100 mL/hr over 60 Minutes Intravenous Every 8 hours 11/01/2015 2239 11/02/15 0936   10/26/2015 1915  ciprofloxacin (CIPRO) IVPB 400 mg     400 mg 200 mL/hr over 60 Minutes Intravenous  Once 11/07/2015 1909  11/17/2015 2054   10/27/2015 1915  metroNIDAZOLE (FLAGYL) IVPB 500 mg     500 mg 100 mL/hr over 60 Minutes Intravenous  Once 11/02/2015 1909 11/11/2015 2047        HPI/Subjective: Afebrile currently, feeling better overall. Reports breathing easier. Patient reports minimal intermittent abd pain and endorses essentially resolution of loose stools. Still tachypneic and with mild desaturation with minimal activity/exertion.  Objective: Vitals:   11/10/15 1257 11/10/15 1435  BP: 115/74   Pulse:  72  Resp:  20  Temp: 98.3 F (36.8 C)     Intake/Output Summary (Last 24 hours) at 11/10/15 1550 Last data filed at 11/10/15 1011  Gross per 24 hour  Intake             1320 ml  Output             2400 ml  Net            -1080 ml   Filed Weights   11/07/15 0331 11/08/15 0354 11/09/15 0500  Weight: 88.4 kg (194 lb 14.2 oz) 87.5 kg (192 lb 14.4 oz) 89.6 kg (197 lb 8 oz)    Exam:   General:  In no distress, denies CP. Afebrile currently and endorsing some improvement in her breathing. During evaluation noted to be easily tachypneic and her O2 sat dropping into 86-88% with minimal exertion. Overall feels better.  Cardiovascular: S1 and S2, no rubs, no gallops, no JVD  Respiratory: no wheezing, improved air movement, fine crackles at bases bilaterally  Abdomen: positive BS, diffuse mil tenderness with palpation, no guarding, no distension   Musculoskeletal: trace edema bilaterally, no cyanosis, no clubbing   Data Reviewed: Basic Metabolic Panel:  Recent Labs Lab 11/04/15 0413  11/06/15 0500 11/07/15 0331 11/08/15 0346 11/09/15 0420 11/10/15 0320  NA 134*  < > 134* 132* 131* 128* 124*  K 3.3*  < > 3.1* 3.3* 4.2 4.7 4.7  CL 98*  < > 101 99* 97* 95* 92*  CO2 26  < > '25 25 24 '$ 21* 22  GLUCOSE 142*  < > 81 182* 114* 133* 218*  BUN 17  < > '18 18 18 19 19  '$ CREATININE 1.53*  < > 1.35* 1.26* 1.58* 1.51* 1.47*  CALCIUM 8.3*  < > 7.7* 7.5* 7.6* 7.7* 7.7*  MG 1.9  --   --   --  2.0 1.9 1.8   PHOS  --   --   --   --  3.1 3.1 3.7  < > = values in this interval not displayed. Liver Function Tests:  Recent Labs Lab 11/05/15 0520  AST 22  ALT 15  ALKPHOS 89  BILITOT 1.1  PROT 5.5*  ALBUMIN 2.4*   CBC:  Recent Labs Lab 11/05/15 0520 11/06/15 0500 11/07/15 0331 11/08/15 0346 11/09/15 0420 11/10/15 0320  WBC 4.3 6.3 5.3 6.3 6.2 6.1  NEUTROABS 4.0 5.9  --  6.1 6.0 6.0  HGB 9.7* 9.2* 8.5*  8.5* 8.7* 8.6*  HCT 28.9* 28.3* 25.4* 25.4* 25.6* 25.2*  MCV 86.3 86.8 85.2 85.2 84.2 83.2  PLT 127* 140* 129* 179 221 231   BNP (last 3 results)  Recent Labs  10/31/15 1516 11/03/15 1430  BNP 299.7* 143.2*   CBG:  Recent Labs Lab 11/09/15 1252 11/09/15 1650 11/09/15 2127 11/10/15 0807 11/10/15 1302  GLUCAP 201* 146* 118* 127* 134*    Recent Results (from the past 240 hour(s))  Culture, blood (routine x 2)     Status: None   Collection Time: 11/03/15  2:24 AM  Result Value Ref Range Status   Specimen Description BLOOD LEFT ANTECUBITAL  Final   Special Requests BOTTLES DRAWN AEROBIC AND ANAEROBIC 5CC  Final   Culture NO GROWTH 5 DAYS  Final   Report Status 11/08/2015 FINAL  Final  Culture, blood (routine x 2)     Status: None   Collection Time: 11/03/15  2:30 AM  Result Value Ref Range Status   Specimen Description BLOOD LEFT HAND  Final   Special Requests BOTTLES DRAWN AEROBIC AND ANAEROBIC 5CC  Final   Culture NO GROWTH 5 DAYS  Final   Report Status 11/08/2015 FINAL  Final  MRSA PCR Screening     Status: None   Collection Time: 11/03/15  5:09 AM  Result Value Ref Range Status   MRSA by PCR NEGATIVE NEGATIVE Final    Comment:        The GeneXpert MRSA Assay (FDA approved for NASAL specimens only), is one component of a comprehensive MRSA colonization surveillance program. It is not intended to diagnose MRSA infection nor to guide or monitor treatment for MRSA infections.   Culture, blood (routine x 2)     Status: None   Collection Time: 11/05/15   9:50 AM  Result Value Ref Range Status   Specimen Description BLOOD LEFT HAND  Final   Special Requests BOTTLES DRAWN AEROBIC ONLY  5CC  Final   Culture NO GROWTH 5 DAYS  Final   Report Status 11/10/2015 FINAL  Final  Culture, blood (routine x 2)     Status: None   Collection Time: 11/05/15  9:58 AM  Result Value Ref Range Status   Specimen Description BLOOD LEFT WRIST  Final   Special Requests BOTTLES DRAWN AEROBIC ONLY  5CC  Final   Culture NO GROWTH 5 DAYS  Final   Report Status 11/10/2015 FINAL  Final  C difficile quick scan w PCR reflex     Status: None   Collection Time: 11/05/15 11:05 AM  Result Value Ref Range Status   C Diff antigen NEGATIVE NEGATIVE Final   C Diff toxin NEGATIVE NEGATIVE Final   C Diff interpretation No C. difficile detected.  Final  Respiratory Panel by PCR     Status: None   Collection Time: 11/07/15 11:47 AM  Result Value Ref Range Status   Adenovirus NOT DETECTED NOT DETECTED Final   Coronavirus 229E NOT DETECTED NOT DETECTED Final   Coronavirus HKU1 NOT DETECTED NOT DETECTED Final   Coronavirus NL63 NOT DETECTED NOT DETECTED Final   Coronavirus OC43 NOT DETECTED NOT DETECTED Final   Metapneumovirus NOT DETECTED NOT DETECTED Final   Rhinovirus / Enterovirus NOT DETECTED NOT DETECTED Final   Influenza A NOT DETECTED NOT DETECTED Final   Influenza B NOT DETECTED NOT DETECTED Final   Parainfluenza Virus 1 NOT DETECTED NOT DETECTED Final   Parainfluenza Virus 2 NOT DETECTED NOT DETECTED Final   Parainfluenza Virus 3  NOT DETECTED NOT DETECTED Final   Parainfluenza Virus 4 NOT DETECTED NOT DETECTED Final   Respiratory Syncytial Virus NOT DETECTED NOT DETECTED Final   Bordetella pertussis NOT DETECTED NOT DETECTED Final   Chlamydophila pneumoniae NOT DETECTED NOT DETECTED Final   Mycoplasma pneumoniae NOT DETECTED NOT DETECTED Final     Studies: No results found.  Scheduled Meds: . amLODipine  10 mg Oral Daily  . dolutegravir  50 mg Oral Daily  .  emtricitabine-tenofovir AF  1 tablet Oral Daily  . enoxaparin (LOVENOX) injection  40 mg Subcutaneous Q24H  . furosemide  40 mg Intravenous Daily  . insulin aspart  0-5 Units Subcutaneous QHS  . insulin aspart  0-9 Units Subcutaneous TID WC  . ipratropium-albuterol  3 mL Nebulization TID  . methylPREDNISolone (SOLU-MEDROL) injection  40 mg Intravenous Q12H  . metoprolol tartrate  50 mg Oral BID  . pantoprazole  40 mg Oral Daily  . potassium chloride  20 mEq Oral Daily  . saccharomyces boulardii  250 mg Oral BID  . sodium chloride flush  10-40 mL Intracatheter Q12H  . sodium chloride flush  3 mL Intravenous Q12H  . 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 If 7PM-7AM, please contact night-coverage at [email or web address], password [facility] 11/10/2015, 3:50 PM  LOS: 11 days

## 2015-11-11 ENCOUNTER — Inpatient Hospital Stay (HOSPITAL_COMMUNITY): Payer: BC Managed Care – PPO

## 2015-11-11 LAB — MAGNESIUM: MAGNESIUM: 1.9 mg/dL (ref 1.7–2.4)

## 2015-11-11 LAB — CBC WITH DIFFERENTIAL/PLATELET
Basophils Absolute: 0 K/uL (ref 0.0–0.1)
Basophils Relative: 0 %
Eosinophils Absolute: 0 K/uL (ref 0.0–0.7)
Eosinophils Relative: 0 %
HCT: 25.1 % — ABNORMAL LOW (ref 36.0–46.0)
Hemoglobin: 8.5 g/dL — ABNORMAL LOW (ref 12.0–15.0)
Lymphocytes Relative: 1 %
Lymphs Abs: 0.1 K/uL — ABNORMAL LOW (ref 0.7–4.0)
MCH: 28.3 pg (ref 26.0–34.0)
MCHC: 33.9 g/dL (ref 30.0–36.0)
MCV: 83.7 fL (ref 78.0–100.0)
Monocytes Absolute: 0.1 K/uL (ref 0.1–1.0)
Monocytes Relative: 1 %
Neutro Abs: 7.1 K/uL (ref 1.7–7.7)
Neutrophils Relative %: 98 %
Platelets: 287 K/uL (ref 150–400)
RBC: 3 MIL/uL — ABNORMAL LOW (ref 3.87–5.11)
RDW: 15.6 % — ABNORMAL HIGH (ref 11.5–15.5)
WBC: 7.3 K/uL (ref 4.0–10.5)

## 2015-11-11 LAB — BASIC METABOLIC PANEL
ANION GAP: 12 (ref 5–15)
BUN: 22 mg/dL — ABNORMAL HIGH (ref 6–20)
CHLORIDE: 93 mmol/L — AB (ref 101–111)
CO2: 21 mmol/L — AB (ref 22–32)
Calcium: 8.2 mg/dL — ABNORMAL LOW (ref 8.9–10.3)
Creatinine, Ser: 1.62 mg/dL — ABNORMAL HIGH (ref 0.44–1.00)
GFR calc non Af Amer: 35 mL/min — ABNORMAL LOW (ref >60)
GFR, EST AFRICAN AMERICAN: 41 mL/min — AB (ref >60)
GLUCOSE: 170 mg/dL — AB (ref 65–99)
Potassium: 5.2 mmol/L — ABNORMAL HIGH (ref 3.5–5.1)
Sodium: 126 mmol/L — ABNORMAL LOW (ref 135–145)

## 2015-11-11 LAB — TOXOPLASMA ANTIBODIES- IGG AND  IGM
Toxoplasma Antibody- IgM: <3 [AU]/ml (ref 0.0–7.9)
Toxoplasma IgG Ratio: 3.5 [IU]/mL (ref 0.0–7.1)

## 2015-11-11 LAB — GLUCOSE, CAPILLARY
GLUCOSE-CAPILLARY: 155 mg/dL — AB (ref 65–99)
GLUCOSE-CAPILLARY: 118 mg/dL — AB (ref 65–99)
Glucose-Capillary: 257 mg/dL — ABNORMAL HIGH (ref 65–99)
Glucose-Capillary: 152 mg/dL — ABNORMAL HIGH (ref 65–99)

## 2015-11-11 LAB — PHOSPHORUS: PHOSPHORUS: 4.1 mg/dL (ref 2.5–4.6)

## 2015-11-11 MED ORDER — FUROSEMIDE 40 MG PO TABS
40.0000 mg | ORAL_TABLET | Freq: Every day | ORAL | Status: DC
  Administered 2015-11-11 – 2015-11-14 (×4): 40 mg via ORAL
  Filled 2015-11-11 (×4): qty 1

## 2015-11-11 NOTE — Progress Notes (Signed)
Patient took BIPAP mask off of herself 10 minutes ago according to her. Pt's O2 sat dropped to low 80s. RT and RN placed patient back on HFNC at 10lpm. Patient is now resting comfortably with O2 sat 94% and no resp distress is noted. RT will continue to monitor as needed.

## 2015-11-11 NOTE — Progress Notes (Signed)
[facility] PROGRESS NOTE  Nancy Tarrant ZOX:[id number] DOB: 01/21/1960 DOA: 10/24/2015 PCP: Binnie Rail, MD  Interim summary and HPI 1 ? Hypertension,  chronic kidney disease stage II,  OSA,  GERD, and  recent diagnosis of Crohn's colitis managed with prednisone  presented to the [facility] 10/20/2015   2-3 days of fevers, nausea, vomiting, and nonbloody diarrhea. insidious development of abdominal pain approximately 6 months ago and underwent a colonoscopy in July of this year with a large ulcer at the terminal ileum and several punctate deep ulcers in the right colon, with pathology consistent with Crohn's Admit with abdominal pain as severe, cramping in character, localized to the lower abdomen, and associated with loss of appetite.  On 10/20 her resp status decompensated and patient was moved to ICU. Work up demonstrated bilateral infiltrates and ground glass opacity. 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  bactrim IV and steroids, ID is on board.  Patient resp status stabilizes with the use of BIPAP On 10/23 was move to stepdown and back to [facility] service.   Assessment/Plan: Sepsis: due to AIDS/PCP PNA ID on board, will follow rec's -patient CD4 count < 10 -treating her with IV bactrim and steroids -also started on Tivicay and Descovy -HLA [id number] and viral load reflex pending -Repeat blood cultures w/o growth  -will continue supportive care -Under guidance of infectious disease can probably consider transitioning to oral steroids and oral antibiotics soon?  Acute resp failure with hypoxia: due to PCP PNA, ARDS and ALI. -no intubation required -patient now using BIPAP QHS -oxygen supplementation through [place] during the day (high flow oxygen) -will continue IV lasix, with close follow up to electrolytes and renal function -continue abx's and steroids for PCP -2 view x-ray 10/25 showing clearing infiltrates  Crohn's ileocolitis with  entero-enterofistula and flare -appreciate GI inputs and rec's  -for now will continue steroids -diet has been advanced to heart healthy/carb modified diet; well tolerated -11/14/2015 CT abdomen and pelvis--long segment of bowel wall thickening with surrounding inflammation from terminal ileum to the sigmoid with 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 -C diff negative  -Will follow GI rec's (at this point, focus on tx for HIV and PCP; outpatient follow up with her primary gastroenterologist (Dr. Ardis Hughs) and at that time repeat colonoscopy once HIV is well controlled to reevaluate diagnosis of IBD vs GI inflammation from HIV). -CMV was ordered   RLL infiltrate/Ground glass opacities -HCAP vs pulmonary edema vs PCP PNA -procalcitonin 1.19 and trending down  -continue IV bactrim as per ID rec's-see above discussion -given diagnosis of HIV and super low CD 4 count, will continue tx for PCP; other antibiotics discontinued  -follow blood cultures, neg up to date  Chest pain/Elevated troponin -demand ischemia in setting of sepsis -with recent work up during this year, 02/11/15 cardiac cath--normal coronaries -no signs of acute ischemia on EKG -Echocardiogram--EF 55-65%, no WMA, trivial MR/TR  Indeterminant QuanteriFERON PPD was placed so far negative  -checking for latent TB for remicade -NO clinical suspicion for active pulm TB at this moment -per ID, consider repeat TB testing once immune system is reconstituted   Acute on chronic kidney disease: stage 2 at baseline -Baseline creatinine 1-1.2 -currently 1.4 (and trending down) -will monitor trend  -minimize nephrotoxic agents and follow medication adjustment per pharmacy   Hypertension -Continue amlodipine 10 mg and metoprolol tartrate 50 twice a day -BP is now stable -pt endorsed poor compliance with  meds at home missing 2-3 days per week approx. -Tells me that she feels dizzy when she  stands so we will order orthostatic vital signs as well  Diabetes mellitus type 2 - stable -10/02/2015 hemoglobin A1c 6.6 -Continue NovoLog sliding scale  -anticipate CBG's elevated due to steroids  GERD -10/22/14--EGD--The esophageal mucosa was abnormal appearing throughout but mainly in the proximal 1/2 of the esophagus. -will continue PPI  Thrombocytopenia on admission 111 -likely due to sepsis -currently 221 -will monitor trend -no signs of bleeding appreciated  Diarrhea: -due to chron's  -will add florastor -per GI, ok to use loperamide or lomotil if needed  -neg C. Diff  Hyponatremia, hypokalemia -due to ongoing diarrhea, and for her hyponatremia PCP PNA also contributing -will replete electrolytes and follow trend. -Lasix changed to by mouth formulation 11/11/2015 -Potassium discontinued as mildly hyperkalemic 10/25  Anemia -  -AOCD  -transfused 2 units PRBCs 11/01/15 -Hgb stable -no signs of bleeding appreciated  OSA -currently on BIPAP QHS  Code Status: Full Family Communication: no family at bedside  Disposition Plan: Continue treatment as above  Consultants:  GI (sign off)  PCCM (sign off)  ID  Procedures:  See below for x-ray reports   Antibiotics: Anti-infectives    Start     Dose/Rate Route Frequency Ordered Stop   11/07/15 1700  dolutegravir (TIVICAY) tablet 50 mg     50 mg Oral Daily 11/07/15 1556     11/07/15 1700  emtricitabine-tenofovir AF (DESCOVY) 200-25 MG per tablet 1 tablet     1 tablet Oral Daily 11/07/15 1556     11/06/15 1400  sulfamethoxazole-trimethoprim (BACTRIM) 320 mg of trimethoprim in dextrose 5 % 500 mL IVPB  Status:  Discontinued     320 mg of trimethoprim 346.7 mL/hr over 90 Minutes Intravenous Every 6 hours 11/06/15 1305 11/06/15 1310   11/06/15 1400  sulfamethoxazole-trimethoprim (BACTRIM) 320 mg of trimethoprim in dextrose 5 % 500 mL IVPB  Status:  Discontinued     320 mg of trimethoprim 346.7 mL/hr over 90  Minutes Intravenous Every 8 hours 11/06/15 1310 11/06/15 1312   11/06/15 1400  sulfamethoxazole-trimethoprim (BACTRIM) 320 mg of trimethoprim in dextrose 5 % 500 mL IVPB     320 mg of trimethoprim 346.7 mL/hr over 90 Minutes Intravenous Every 6 hours 11/06/15 1312     11/05/15 1530  vancomycin (VANCOCIN) IVPB 1000 mg/200 mL premix     1,000 mg 200 mL/hr over 60 Minutes Intravenous  Once 11/05/15 1456 11/05/15 1710   11/03/15 0600  vancomycin (VANCOCIN) IVPB 750 mg/150 ml premix  Status:  Discontinued     750 mg 150 mL/hr over 60 Minutes Intravenous Every 12 hours 11/03/15 0550 11/03/15 1350   11/03/15 0600  piperacillin-tazobactam (ZOSYN) IVPB 3.375 g  Status:  Discontinued     3.375 g 12.5 mL/hr over 240 Minutes Intravenous Every 8 hours 11/03/15 0550 11/08/15 1020   11/02/15 2000  ciprofloxacin (CIPRO) tablet 500 mg  Status:  Discontinued     500 mg Oral 2 times daily 11/02/15 0936 11/03/15 0536   11/02/15 1400  metroNIDAZOLE (FLAGYL) tablet 500 mg  Status:  Discontinued     500 mg Oral Every 8 hours 11/02/15 0936 11/03/15 0536   10/31/15 0800  ciprofloxacin (CIPRO) IVPB 400 mg  Status:  Discontinued     400 mg 200 mL/hr over 60 Minutes Intravenous Every 12 hours 10/20/2015 2308 11/02/15 0936   10/31/15 0300  metroNIDAZOLE (FLAGYL) IVPB 500 mg  Status:  Discontinued  500 mg 100 mL/hr over 60 Minutes Intravenous Every 8 hours 11/09/2015 2239 11/02/15 0936   10/28/2015 1915  ciprofloxacin (CIPRO) IVPB 400 mg     400 mg 200 mL/hr over 60 Minutes Intravenous  Once 11/11/2015 1909 11/12/2015 2054   10/18/2015 1915  metroNIDAZOLE (FLAGYL) IVPB 500 mg     500 mg 100 mL/hr over 60 Minutes Intravenous  Once 11/12/2015 1909 10/27/2015 2047        HPI/Subjective: Afebrile currently, feeling better overall. Reports breathing easier. Patient reports minimal intermittent abd pain and endorses essentially resolution of loose stools. Still tachypneic and with mild desaturation with minimal  activity/exertion.  Objective: Vitals:   11/11/15 0403 11/11/15 0500  BP:  117/73  Pulse: 80   Resp: 18   Temp:  98.3 F (36.8 C)    Intake/Output Summary (Last 24 hours) at 11/11/15 0750 Last data filed at 11/11/15 0500  Gross per 24 hour  Intake             1320 ml  Output             3725 ml  Net            -2405 ml   Filed Weights   11/07/15 0331 11/08/15 0354 11/09/15 0500  Weight: 88.4 kg (194 lb 14.2 oz) 87.5 kg (192 lb 14.4 oz) 89.6 kg (197 lb 8 oz)    Exam:   General:  In no distress, denies CP. Feels weak overall has not been out of bed at  Cardiovascular: S1 and S2, no rubs, no gallops, no JVD  Respiratory: no wheezing, improved air movement on the right posterior lung fields however decreased on the left  Abdomen: positive BS, diffuse mil tenderness with palpation, no guarding, no distension   Musculoskeletal: trace edema bilaterally, no cyanosis, no clubbing   Data Reviewed: Basic Metabolic Panel:  Recent Labs Lab 11/07/15 0331 11/08/15 0346 11/09/15 0420 11/10/15 0320 11/11/15 0500  NA 132* 131* 128* 124* 126*  K 3.3* 4.2 4.7 4.7 5.2*  CL 99* 97* 95* 92* 93*  CO2 25 24 21* 22 21*  GLUCOSE 182* 114* 133* 218* 170*  BUN _0 22*  CREATININE 1.26* 1.58* 1.51* 1.47* 1.62*  CALCIUM 7.5* 7.6* 7.7* 7.7* 8.2*  MG  --  2.0 1.9 1.8 1.9  PHOS  --  3.1 3.1 3.7 4.1   Liver Function Tests:  Recent Labs Lab 11/05/15 0520  AST 22  ALT 15  ALKPHOS 89  BILITOT 1.1  PROT 5.5*  ALBUMIN 2.4*   CBC:  Recent Labs Lab 11/06/15 0500 11/07/15 0331 11/08/15 0346 11/09/15 0420 11/10/15 0320 11/11/15 0500  WBC 6.3 5.3 6.3 6.2 6.1 7.3  NEUTROABS 5.9  --  6.1 6.0 6.0 PENDING  HGB 9.2* 8.5* 8.5* 8.7* 8.6* 8.5*  HCT 28.3* 25.4* 25.4* 25.6* 25.2* 25.1*  MCV 86.8 85.2 85.2 84.2 83.2 83.7  PLT 140* 129* 179 221 231 287   BNP (last 3 results)  Recent Labs  10/31/15 1516 11/03/15 1430  BNP 299.7* 143.2*   CBG:  Recent Labs Lab  11/09/15 2127 11/10/15 0807 11/10/15 1302 11/10/15 1715 11/10/15 2156  GLUCAP 118* 127* 134* 121* 161*    Recent Results (from the past 240 hour(s))  Culture, blood (routine x 2)     Status: None   Collection Time: 11/03/15  2:24 AM  Result Value Ref Range Status   Specimen Description BLOOD LEFT ANTECUBITAL  Final   Special Requests BOTTLES  DRAWN AEROBIC AND ANAEROBIC 5CC  Final   Culture NO GROWTH 5 DAYS  Final   Report Status 11/08/2015 FINAL  Final  Culture, blood (routine x 2)     Status: None   Collection Time: 11/03/15  2:30 AM  Result Value Ref Range Status   Specimen Description BLOOD LEFT HAND  Final   Special Requests BOTTLES DRAWN AEROBIC AND ANAEROBIC 5CC  Final   Culture NO GROWTH 5 DAYS  Final   Report Status 11/08/2015 FINAL  Final  MRSA PCR Screening     Status: None   Collection Time: 11/03/15  5:09 AM  Result Value Ref Range Status   MRSA by PCR NEGATIVE NEGATIVE Final    Comment:        The GeneXpert MRSA Assay (FDA approved for NASAL specimens only), is one component of a comprehensive MRSA colonization surveillance program. It is not intended to diagnose MRSA infection nor to guide or monitor treatment for MRSA infections.   Culture, blood (routine x 2)     Status: None   Collection Time: 11/05/15  9:50 AM  Result Value Ref Range Status   Specimen Description BLOOD LEFT HAND  Final   Special Requests BOTTLES DRAWN AEROBIC ONLY  5CC  Final   Culture NO GROWTH 5 DAYS  Final   Report Status 11/10/2015 FINAL  Final  Culture, blood (routine x 2)     Status: None   Collection Time: 11/05/15  9:58 AM  Result Value Ref Range Status   Specimen Description BLOOD LEFT WRIST  Final   Special Requests BOTTLES DRAWN AEROBIC ONLY  5CC  Final   Culture NO GROWTH 5 DAYS  Final   Report Status 11/10/2015 FINAL  Final  C difficile quick scan w PCR reflex     Status: None   Collection Time: 11/05/15 11:05 AM  Result Value Ref Range Status   C Diff antigen  NEGATIVE NEGATIVE Final   C Diff toxin NEGATIVE NEGATIVE Final   C Diff interpretation No C. difficile detected.  Final  Respiratory Panel by PCR     Status: None   Collection Time: 11/07/15 11:47 AM  Result Value Ref Range Status   Adenovirus NOT DETECTED NOT DETECTED Final   Coronavirus 229E NOT DETECTED NOT DETECTED Final   Coronavirus HKU1 NOT DETECTED NOT DETECTED Final   Coronavirus NL63 NOT DETECTED NOT DETECTED Final   Coronavirus OC43 NOT DETECTED NOT DETECTED Final   Metapneumovirus NOT DETECTED NOT DETECTED Final   Rhinovirus / Enterovirus NOT DETECTED NOT DETECTED Final   Influenza A NOT DETECTED NOT DETECTED Final   Influenza B NOT DETECTED NOT DETECTED Final   Parainfluenza Virus 1 NOT DETECTED NOT DETECTED Final   Parainfluenza Virus 2 NOT DETECTED NOT DETECTED Final   Parainfluenza Virus 3 NOT DETECTED NOT DETECTED Final   Parainfluenza Virus 4 NOT DETECTED NOT DETECTED Final   Respiratory Syncytial Virus NOT DETECTED NOT DETECTED Final   Bordetella pertussis NOT DETECTED NOT DETECTED Final   Chlamydophila pneumoniae NOT DETECTED NOT DETECTED Final   Mycoplasma pneumoniae NOT DETECTED NOT DETECTED Final     Studies: No results found.  Scheduled Meds: . amLODipine  10 mg Oral Daily  . dolutegravir  50 mg Oral Daily  . emtricitabine-tenofovir AF  1 tablet Oral Daily  . enoxaparin (LOVENOX) injection  40 mg Subcutaneous Q24H  . furosemide  40 mg Intravenous Daily  . insulin aspart  0-5 Units Subcutaneous QHS  . insulin aspart  0-9 Units Subcutaneous TID WC  . ipratropium-albuterol  3 mL Nebulization TID  . methylPREDNISolone (SOLU-MEDROL) injection  40 mg Intravenous Q12H  . metoprolol tartrate  50 mg Oral BID  . pantoprazole  40 mg Oral Daily  . potassium chloride  20 mEq Oral Daily  . saccharomyces boulardii  250 mg Oral BID  . sodium chloride flush  10-40 mL Intracatheter Q12H  . sodium chloride flush  3 mL Intravenous Q12H  . 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

## 2015-11-12 ENCOUNTER — Other Ambulatory Visit: Payer: Self-pay | Admitting: Internal Medicine

## 2015-11-12 LAB — HLA B*5701: HLA B 5701: NEGATIVE

## 2015-11-12 LAB — RENAL FUNCTION PANEL
Albumin: 2.3 g/dL — ABNORMAL LOW (ref 3.5–5.0)
Anion gap: 12 (ref 5–15)
BUN: 27 mg/dL — AB (ref 6–20)
CHLORIDE: 90 mmol/L — AB (ref 101–111)
CO2: 20 mmol/L — AB (ref 22–32)
CREATININE: 1.73 mg/dL — AB (ref 0.44–1.00)
Calcium: 8 mg/dL — ABNORMAL LOW (ref 8.9–10.3)
GFR calc Af Amer: 38 mL/min — ABNORMAL LOW (ref >60)
GFR calc non Af Amer: 33 mL/min — ABNORMAL LOW (ref >60)
Glucose, Bld: 151 mg/dL — ABNORMAL HIGH (ref 65–99)
Phosphorus: 4.8 mg/dL — ABNORMAL HIGH (ref 2.5–4.6)
Potassium: 5.2 mmol/L — ABNORMAL HIGH (ref 3.5–5.1)
Sodium: 122 mmol/L — ABNORMAL LOW (ref 135–145)

## 2015-11-12 LAB — CBC WITH DIFFERENTIAL/PLATELET
Basophils Absolute: 0 10*3/uL (ref 0.0–0.1)
Basophils Relative: 0 %
EOS PCT: 0 %
Eosinophils Absolute: 0 10*3/uL (ref 0.0–0.7)
HCT: 23.8 % — ABNORMAL LOW (ref 36.0–46.0)
Hemoglobin: 8.1 g/dL — ABNORMAL LOW (ref 12.0–15.0)
LYMPHS ABS: 0.1 10*3/uL — AB (ref 0.7–4.0)
LYMPHS PCT: 1 %
MCH: 28.1 pg (ref 26.0–34.0)
MCHC: 34 g/dL (ref 30.0–36.0)
MCV: 82.6 fL (ref 78.0–100.0)
MONO ABS: 0 10*3/uL — AB (ref 0.1–1.0)
Monocytes Relative: 1 %
Neutro Abs: 7.5 10*3/uL (ref 1.7–7.7)
Neutrophils Relative %: 98 %
PLATELETS: 295 10*3/uL (ref 150–400)
RBC: 2.88 MIL/uL — ABNORMAL LOW (ref 3.87–5.11)
RDW: 15.4 % (ref 11.5–15.5)
WBC: 7.6 10*3/uL (ref 4.0–10.5)

## 2015-11-12 LAB — GLUCOSE, CAPILLARY
GLUCOSE-CAPILLARY: 157 mg/dL — AB (ref 65–99)
GLUCOSE-CAPILLARY: 155 mg/dL — AB (ref 65–99)
GLUCOSE-CAPILLARY: 150 mg/dL — AB (ref 65–99)
Glucose-Capillary: 144 mg/dL — ABNORMAL HIGH (ref 65–99)
Glucose-Capillary: 166 mg/dL — ABNORMAL HIGH (ref 65–99)
Glucose-Capillary: 126 mg/dL — ABNORMAL HIGH (ref 65–99)

## 2015-11-12 MED ORDER — SULFAMETHOXAZOLE-TRIMETHOPRIM 800-160 MG PO TABS
1.0000 | ORAL_TABLET | Freq: Two times a day (BID) | ORAL | Status: DC
  Filled 2015-11-12: qty 1

## 2015-11-12 MED ORDER — SODIUM CHLORIDE 1 G PO TABS
1.0000 g | ORAL_TABLET | Freq: Two times a day (BID) | ORAL | Status: DC
  Administered 2015-11-12 – 2015-11-22 (×16): 1 g via ORAL
  Filled 2015-11-12 (×22): qty 1

## 2015-11-12 MED ORDER — EMTRICITABINE-TENOFOVIR AF 200-25 MG PO TABS: 1.0000 | ORAL_TABLET | Freq: Every day | ORAL | 11 refills | Status: DC

## 2015-11-12 MED ORDER — SULFAMETHOXAZOLE-TRIMETHOPRIM 800-160 MG PO TABS
2.0000 | ORAL_TABLET | Freq: Three times a day (TID) | ORAL | Status: DC
  Administered 2015-11-12 – 2015-11-14 (×9): 2 via ORAL
  Filled 2015-11-12 (×10): qty 2

## 2015-11-12 MED ORDER — DOLUTEGRAVIR SODIUM 50 MG PO TABS: 50.0000 mg | ORAL_TABLET | Freq: Every day | ORAL | 11 refills | Status: DC

## 2015-11-12 MED ORDER — PREDNISONE 50 MG PO TABS
60.0000 mg | ORAL_TABLET | Freq: Every day | ORAL | Status: DC
  Administered 2015-11-12 – 2015-11-14 (×3): 60 mg via ORAL
  Filled 2015-11-12 (×3): qty 1

## 2015-11-12 MED ORDER — AZITHROMYCIN 600 MG PO TABS
1200.0000 mg | ORAL_TABLET | ORAL | Status: DC
  Administered 2015-11-12 – 2015-11-26 (×3): 1200 mg via ORAL
  Filled 2015-11-12 (×3): qty 2

## 2015-11-12 NOTE — Progress Notes (Signed)
[facility] PROGRESS NOTE  Nancy Acevedo YIF:[id number] DOB: 1960-10-26 DOA: 10/31/2015 PCP: Binnie Rail, MD  Interim summary and HPI 58 ? Hypertension,  chronic kidney disease stage II,  OSA,  GERD, and  recent diagnosis of Crohn's colitis managed with prednisone  presented to the [facility] 11/15/2015   2-3 days of fevers, nausea, vomiting, and nonbloody diarrhea. insidious development of abdominal pain approximately 6 months ago and underwent a colonoscopy in July of this year with a large ulcer at the terminal ileum and several punctate deep ulcers in the right colon, with pathology consistent with Crohn's Admit with abdominal pain as severe, cramping in character, localized to the lower abdomen, and associated with loss of appetite.  On 10/20 her resp status decompensated and patient was moved to ICU. Work up demonstrated bilateral infiltrates and ground glass opacity. 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  bactrim IV and steroids, ID is on board.  Patient resp status stabilizes with the use of BIPAP On 10/23 was move to stepdown and back to [facility] service.   Assessment/Plan: Sepsis: due to AIDS/PCP PNA ID on board, will follow rec's -patient CD4 count < 10 -treating her with IV bactrim and steroids-half transitioned both to oral formulations on 10/25 -also started on Tivicay and Descovy -HLA [id number] and viral load reflex pending -Repeat blood cultures w/o growth  -will continue supportive care  Acute resp failure with hypoxia: due to PCP PNA, ARDS and ALI. -no intubation required -patient now using BIPAP QHS -oxygen supplementation through [place] during the day (high flow oxygen), patient desats to 80% without this -will continue IV lasix, with close follow up to electrolytes and renal function -continue abx's and steroids for PCP -2 view x-ray 10/25 showing clearing infiltrates  Crohn's ileocolitis with entero-enterofistula and  flare with diarrhea -appreciate GI inputs and rec's  -for now will continue steroids -diet has been advanced to heart healthy/carb modified diet; well tolerated -10/22/2015 CT abdomen and pelvis--long segment of bowel wall thickening with surrounding inflammation from terminal ileum to the sigmoid with 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 -C diff negative  -Will follow GI rec's (at this point, focus on tx for HIV and PCP; outpatient follow up with her primary gastroenterologist (Dr. Ardis Hughs) and at that time repeat colonoscopy once HIV is well controlled to reevaluate diagnosis of IBD vs GI inflammation from HIV). -CMV was ordered  -Diarrhea 3 stools daily however not worse and in fact seems better according to patient  RLL infiltrate/Ground glass opacities -HCAP vs pulmonary edema vs PCP PNA -procalcitonin 1.19 and trending down  -continue IV bactrim as per ID rec's-see above discussion -given diagnosis of HIV and  low CD 4 count, will continue tx for PCP; other antibiotics discontinued  -follow blood cultures, neg up to date  Chest pain/Elevated troponin -demand ischemia in setting of sepsis -with recent work up during this year, 02/11/15 cardiac cath--normal coronaries -no signs of acute ischemia on EKG -Echocardiogram--EF 55-65%, no WMA, trivial MR/TR  Indeterminant QuanteriFERON PPD was placed so far negative  -checking for latent TB for remicade -NO clinical suspicion for active pulm TB at this moment -per ID, consider repeat TB testing once immune system is reconstituted   Acute on chronic kidney disease: stage 2 at baseline -Baseline creatinine 1-1.2 -currently 1.4 (and trending down) -will monitor trend  -minimize nephrotoxic agents and follow medication adjustment per pharmacy   Hypertension -Continue amlodipine 10 mg and metoprolol  tartrate 50 twice a day -BP is now stable -pt endorsed poor compliance with meds at home  missing 2-3 days per week approx. -Tells me that she feels dizzy when she stands so we will order orthostatic vital signs as well  Diabetes mellitus type 2 - stable -10/02/2015 hemoglobin A1c 6.6 -Continue NovoLog sliding scale  -anticipate CBG's elevated due to steroids  GERD -10/22/14--EGD--The esophageal mucosa was abnormal appearing throughout but mainly in the proximal 1/2 of the esophagus. -will continue PPI  Thrombocytopenia on admission 111 -likely due to sepsis -currently 221 -will monitor trend -no signs of bleeding appreciated  Diarrhea: -due to chron's  -will add florastor -per GI, ok to use loperamide or lomotil if needed  -neg C. Diff  Probable hypervolemic Hyponatremia and a setting of chronic diarrhea, hypokalemia -due to ongoing diarrhea, and for her hyponatremia PCP PNA also contributing -will replete electrolytes and follow trend. -Replaced orally with salt tablets and liberalize diet to regular -Lasix changed to by mouth formulation 11/11/2015 -Potassium discontinued as mildly hyperkalemic 10/25 -Repeat labs in a.m.  Anemia -  -AOCD  -transfused 2 units PRBCs 11/01/15 -Hgb stable -no signs of bleeding appreciated  OSA -currently on BIPAP QHS  Code Status: Full Family Communication: no family at bedside  Disposition Plan: Continue treatment as above  Consultants:  GI (sign off)  PCCM (sign off)  ID  Procedures:  See below for x-ray reports   Antibiotics: Anti-infectives    Start     Dose/Rate Route Frequency Ordered Stop   11/12/15 1000  sulfamethoxazole-trimethoprim (BACTRIM DS,SEPTRA DS) 800-160 MG per tablet 1 tablet  Status:  Discontinued     1 tablet Oral Every 12 hours 11/12/15 0729 11/12/15 0829   11/12/15 1000  sulfamethoxazole-trimethoprim (BACTRIM DS,SEPTRA DS) 800-160 MG per tablet 2 tablet     2 tablet Oral 3 times daily 11/12/15 0829 11/27/15 0959   11/07/15 1700  dolutegravir (TIVICAY) tablet 50 mg     50 mg Oral  Daily 11/07/15 1556     11/07/15 1700  emtricitabine-tenofovir AF (DESCOVY) 200-25 MG per tablet 1 tablet     1 tablet Oral Daily 11/07/15 1556     11/06/15 1400  sulfamethoxazole-trimethoprim (BACTRIM) 320 mg of trimethoprim in dextrose 5 % 500 mL IVPB  Status:  Discontinued     320 mg of trimethoprim 346.7 mL/hr over 90 Minutes Intravenous Every 6 hours 11/06/15 1305 11/06/15 1310   11/06/15 1400  sulfamethoxazole-trimethoprim (BACTRIM) 320 mg of trimethoprim in dextrose 5 % 500 mL IVPB  Status:  Discontinued     320 mg of trimethoprim 346.7 mL/hr over 90 Minutes Intravenous Every 8 hours 11/06/15 1310 11/06/15 1312   11/06/15 1400  sulfamethoxazole-trimethoprim (BACTRIM) 320 mg of trimethoprim in dextrose 5 % 500 mL IVPB  Status:  Discontinued     320 mg of trimethoprim 346.7 mL/hr over 90 Minutes Intravenous Every 6 hours 11/06/15 1312 11/12/15 0729   11/05/15 1530  vancomycin (VANCOCIN) IVPB 1000 mg/200 mL premix     1,000 mg 200 mL/hr over 60 Minutes Intravenous  Once 11/05/15 1456 11/05/15 1710   11/03/15 0600  vancomycin (VANCOCIN) IVPB 750 mg/150 ml premix  Status:  Discontinued     750 mg 150 mL/hr over 60 Minutes Intravenous Every 12 hours 11/03/15 0550 11/03/15 1350   11/03/15 0600  piperacillin-tazobactam (ZOSYN) IVPB 3.375 g  Status:  Discontinued     3.375 g 12.5 mL/hr over 240 Minutes Intravenous Every 8 hours 11/03/15 0550 11/08/15 1020  11/02/15 2000  ciprofloxacin (CIPRO) tablet 500 mg  Status:  Discontinued     500 mg Oral 2 times daily 11/02/15 0936 11/03/15 0536   11/02/15 1400  metroNIDAZOLE (FLAGYL) tablet 500 mg  Status:  Discontinued     500 mg Oral Every 8 hours 11/02/15 0936 11/03/15 0536   10/31/15 0800  ciprofloxacin (CIPRO) IVPB 400 mg  Status:  Discontinued     400 mg 200 mL/hr over 60 Minutes Intravenous Every 12 hours 11/08/2015 2308 11/02/15 0936   10/31/15 0300  metroNIDAZOLE (FLAGYL) IVPB 500 mg  Status:  Discontinued     500 mg 100 mL/hr over 60  Minutes Intravenous Every 8 hours 11/05/2015 2239 11/02/15 0936   10/27/2015 1915  ciprofloxacin (CIPRO) IVPB 400 mg     400 mg 200 mL/hr over 60 Minutes Intravenous  Once 10/20/2015 1909 10/26/2015 2054   11/12/2015 1915  metroNIDAZOLE (FLAGYL) IVPB 500 mg     500 mg 100 mL/hr over 60 Minutes Intravenous  Once 11/06/2015 1909 11/07/2015 2047        HPI/Subjective: Afebrile currently, feeling better overall. Reports breathing easier. Patient reports minimal intermittent abd pain and endorses essentially resolution of loose stools. Still tachypneic and with mild desaturation with minimal activity/exertion.  Objective: Vitals:   11/12/15 0343 11/12/15 0757  BP:  102/66  Pulse:    Resp:    Temp: 97.8 F (36.6 C) 98.3 F (36.8 C)    Intake/Output Summary (Last 24 hours) at 11/12/15 0844 Last data filed at 11/12/15 0236  Gross per 24 hour  Intake             1160 ml  Output             1500 ml  Net             -340 ml   Filed Weights   11/08/15 0354 11/09/15 0500 11/12/15 0345  Weight: 87.5 kg (192 lb 14.4 oz) 89.6 kg (197 lb 8 oz) 83.9 kg (185 lb)    Exam:   General:  In no distress, denies CP. Feels weak overallBut slightly improved  Cardiovascular: S1 and S2, no rubs, no gallops, no JVD  Respiratory: no wheezing, improved air movement on the right posterior lung fields however decreased on the left  Abdomen: positive BS, diffuse mil tenderness with palpation, no guarding, no distension   Musculoskeletal: trace edema bilaterally, no cyanosis, no clubbing   Data Reviewed: Basic Metabolic Panel:  Recent Labs Lab 11/08/15 0346 11/09/15 0420 11/10/15 0320 11/11/15 0500 11/12/15 0408  NA 131* 128* 124* 126* 122*  K 4.2 4.7 4.7 5.2* 5.2*  CL 97* 95* 92* 93* 90*  CO2 24 21* 22 21* 20*  GLUCOSE 114* 133* 218* 170* 151*  BUN '18 19 19 '$ 22* 27*  CREATININE 1.58* 1.51* 1.47* 1.62* 1.73*  CALCIUM 7.6* 7.7* 7.7* 8.2* 8.0*  MG 2.0 1.9 1.8 1.9  --   PHOS 3.1 3.1 3.7 4.1 4.8*    Liver Function Tests:  Recent Labs Lab 11/12/15 0408  ALBUMIN 2.3*   CBC:  Recent Labs Lab 11/08/15 0346 11/09/15 0420 11/10/15 0320 11/11/15 0500 11/12/15 0408  WBC 6.3 6.2 6.1 7.3 7.6  NEUTROABS 6.1 6.0 6.0 7.1 7.5  HGB 8.5* 8.7* 8.6* 8.5* 8.1*  HCT 25.4* 25.6* 25.2* 25.1* 23.8*  MCV 85.2 84.2 83.2 83.7 82.6  PLT 179 221 231 287 295   BNP (last 3 results)  Recent Labs  10/31/15 1516 11/03/15 1430  BNP 299.7*  143.2*   CBG:  Recent Labs Lab 11/11/15 0808 11/11/15 1207 11/11/15 1700 11/11/15 2125 11/12/15 0803  GLUCAP 155* 257* 152* 118* 144*    Recent Results (from the past 240 hour(s))  Culture, blood (routine x 2)     Status: None   Collection Time: 11/03/15  2:24 AM  Result Value Ref Range Status   Specimen Description BLOOD LEFT ANTECUBITAL  Final   Special Requests BOTTLES DRAWN AEROBIC AND ANAEROBIC 5CC  Final   Culture NO GROWTH 5 DAYS  Final   Report Status 11/08/2015 FINAL  Final  Culture, blood (routine x 2)     Status: None   Collection Time: 11/03/15  2:30 AM  Result Value Ref Range Status   Specimen Description BLOOD LEFT HAND  Final   Special Requests BOTTLES DRAWN AEROBIC AND ANAEROBIC 5CC  Final   Culture NO GROWTH 5 DAYS  Final   Report Status 11/08/2015 FINAL  Final  MRSA PCR Screening     Status: None   Collection Time: 11/03/15  5:09 AM  Result Value Ref Range Status   MRSA by PCR NEGATIVE NEGATIVE Final    Comment:        The GeneXpert MRSA Assay (FDA approved for NASAL specimens only), is one component of a comprehensive MRSA colonization surveillance program. It is not intended to diagnose MRSA infection nor to guide or monitor treatment for MRSA infections.   Culture, blood (routine x 2)     Status: None   Collection Time: 11/05/15  9:50 AM  Result Value Ref Range Status   Specimen Description BLOOD LEFT HAND  Final   Special Requests BOTTLES DRAWN AEROBIC ONLY  5CC  Final   Culture NO GROWTH 5 DAYS  Final    Report Status 11/10/2015 FINAL  Final  Culture, blood (routine x 2)     Status: None   Collection Time: 11/05/15  9:58 AM  Result Value Ref Range Status   Specimen Description BLOOD LEFT WRIST  Final   Special Requests BOTTLES DRAWN AEROBIC ONLY  5CC  Final   Culture NO GROWTH 5 DAYS  Final   Report Status 11/10/2015 FINAL  Final  C difficile quick scan w PCR reflex     Status: None   Collection Time: 11/05/15 11:05 AM  Result Value Ref Range Status   C Diff antigen NEGATIVE NEGATIVE Final   C Diff toxin NEGATIVE NEGATIVE Final   C Diff interpretation No C. difficile detected.  Final  Respiratory Panel by PCR     Status: None   Collection Time: 11/07/15 11:47 AM  Result Value Ref Range Status   Adenovirus NOT DETECTED NOT DETECTED Final   Coronavirus 229E NOT DETECTED NOT DETECTED Final   Coronavirus HKU1 NOT DETECTED NOT DETECTED Final   Coronavirus NL63 NOT DETECTED NOT DETECTED Final   Coronavirus OC43 NOT DETECTED NOT DETECTED Final   Metapneumovirus NOT DETECTED NOT DETECTED Final   Rhinovirus / Enterovirus NOT DETECTED NOT DETECTED Final   Influenza A NOT DETECTED NOT DETECTED Final   Influenza B NOT DETECTED NOT DETECTED Final   Parainfluenza Virus 1 NOT DETECTED NOT DETECTED Final   Parainfluenza Virus 2 NOT DETECTED NOT DETECTED Final   Parainfluenza Virus 3 NOT DETECTED NOT DETECTED Final   Parainfluenza Virus 4 NOT DETECTED NOT DETECTED Final   Respiratory Syncytial Virus NOT DETECTED NOT DETECTED Final   Bordetella pertussis NOT DETECTED NOT DETECTED Final   Chlamydophila pneumoniae NOT DETECTED NOT DETECTED Final  Mycoplasma pneumoniae NOT DETECTED NOT DETECTED Final     Studies: Dg Chest 2 View  Result Date: 11/11/2015 CLINICAL DATA:  Pneumonia and fever. EXAM: CHEST  2 VIEW COMPARISON:  11/08/2015 FINDINGS: Right PICC line positioning stable with the catheter tip in the lower SVC. The heart size remains normal. Both lungs demonstrate improved aeration with  diminishing bilateral infiltrates. No edema or pleural fluid identified. IMPRESSION: Improved aeration bilaterally with diminishing appearance of bilateral infiltrates. Electronically Signed   By: Aletta Edouard M.D.   On: 11/11/2015 08:53    Scheduled Meds: . amLODipine  10 mg Oral Daily  . dolutegravir  50 mg Oral Daily  . emtricitabine-tenofovir AF  1 tablet Oral Daily  . enoxaparin (LOVENOX) injection  40 mg Subcutaneous Q24H  . furosemide  40 mg Oral Daily  . insulin aspart  0-5 Units Subcutaneous QHS  . insulin aspart  0-9 Units Subcutaneous TID WC  . ipratropium-albuterol  3 mL Nebulization TID  . metoprolol tartrate  50 mg Oral BID  . pantoprazole  40 mg Oral Daily  . predniSONE  60 mg Oral QAC breakfast  . saccharomyces boulardii  250 mg Oral BID  . sodium chloride flush  10-40 mL Intracatheter Q12H  . sodium chloride flush  3 mL Intravenous Q12H  . 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

## 2015-11-12 NOTE — Progress Notes (Signed)
Pharmacy Antibiotic Note  Nancy Acevedo is a 55 y.o. female admitted on 11/04/2015.    Continues on day #7/21 of Bactrim for presumed PCP PNA. Switched to PO Bactrim on 10/26. ID recommends lower dose of PO Bactrim after 3 weeks of treatment. Tivicay/Descovy started for new HIV dx. CD4 <10, HIV RNA and HLA labs in process. Starting Azithromycin for OI prophylaxis as well. Afebrile, WBC wnl. Bactrim may be causing rise in SCr and potassium. SCr trending up to 1.73, CrCl ~63m/min.  Plan: Continue Bactrim 2 DS tablets TID for the remainder of 3 weeks, then transition to 1 DS tablet daily Monitor renal function closely (may need to dose adjust Bactrim if CrCl drops below 32mmin), electrolytes Bactrim may also be causing rise in SCr  Height: 5' 4" (162.6 cm) Weight: 185 lb (83.9 kg) IBW/kg (Calculated) : 54.7  Temp (24hrs), Avg:98.3 F (36.8 C), Min:97.8 F (36.6 C), Max:98.8 F (37.1 C)   Recent Labs Lab 11/08/15 0346 11/09/15 0420 11/10/15 0320 11/11/15 0500 11/12/15 0408  WBC 6.3 6.2 6.1 7.3 7.6  CREATININE 1.58* 1.51* 1.47* 1.62* 1.73*    Estimated Creatinine Clearance: 39.4 mL/min (by C-G formula based on SCr of 1.73 mg/dL (H)).    Allergies  Allergen Reactions  . Lexapro [Escitalopram] Itching  . Losartan   . Spironolactone     rash  . Clonidine Derivatives Palpitations   Antimicrobials this admission:  Vanco 10/17 x1 (750 mg); 10/19 x 1 (1g) Zosyn 10/17 >>10/22 Cipro 10/13 >> 10/16 Flagyl 10/13 >> 10/16 Bactrim 10/20 >>  Microbiology results:  10/14 Cdiff >> negative 10/16 Quantiferon Gold >> indeterminate so PPD placed 10/17 - read as negative 10/17 BCx: neg 10/17 MRSA PCR: neg 10/19 BCx2: neg 10/19 Cdif: neg x2 10/20 HIV: pos 10/21 HIV RNA: ip 10/21 CD4: <10 10/21 HLA: ip 10/21 Resp PCR: neg  Thank you for allowing pharmacy to be a part of this patient's care.  NaElenor QuinonesPharmD, BCPS Clinical Pharmacist Pager [phone]/26/2017  8:36 AM

## 2015-11-12 NOTE — Progress Notes (Signed)
Attempted to ambulate patient per MD order. Patient stated she felt dizzy while in bed. Pateint sat up on side of bed for about 3 minutes.  Patient was too dizzy to stand and walk after two steps, states dizziness is worse upon standing.

## 2015-11-12 NOTE — Progress Notes (Signed)
Patient had an episode of a smear of bloody emesis. Asymptomatic and no c/o discomfort.

## 2015-11-13 LAB — GLUCOSE, CAPILLARY
GLUCOSE-CAPILLARY: 168 mg/dL — AB (ref 65–99)
GLUCOSE-CAPILLARY: 151 mg/dL — AB (ref 65–99)
Glucose-Capillary: 108 mg/dL — ABNORMAL HIGH (ref 65–99)
Glucose-Capillary: 106 mg/dL — ABNORMAL HIGH (ref 65–99)

## 2015-11-13 LAB — BASIC METABOLIC PANEL
Anion gap: 13 (ref 5–15)
BUN: 29 mg/dL — ABNORMAL HIGH (ref 6–20)
CHLORIDE: 92 mmol/L — AB (ref 101–111)
CO2: 19 mmol/L — AB (ref 22–32)
CREATININE: 1.8 mg/dL — AB (ref 0.44–1.00)
Calcium: 8.3 mg/dL — ABNORMAL LOW (ref 8.9–10.3)
GFR calc non Af Amer: 31 mL/min — ABNORMAL LOW (ref >60)
GFR, EST AFRICAN AMERICAN: 36 mL/min — AB (ref >60)
GLUCOSE: 89 mg/dL (ref 65–99)
Potassium: 5.1 mmol/L (ref 3.5–5.1)
Sodium: 124 mmol/L — ABNORMAL LOW (ref 135–145)

## 2015-11-13 MED ORDER — DIPHENOXYLATE-ATROPINE 2.5-0.025 MG/5ML PO LIQD
5.0000 mL | Freq: Four times a day (QID) | ORAL | Status: DC
  Administered 2015-11-13 – 2015-11-14 (×2): 5 mL via ORAL
  Filled 2015-11-13 (×3): qty 5

## 2015-11-13 NOTE — Evaluation (Addendum)
Physical Therapy Evaluation Patient Details Name: Nancy Acevedo MRN: [id number] DOB: 04-16-1960 Today's Date: 11/13/2015   History of Present Illness  55 y.o.femalewith medical history significant for hypertension, chronic kidney disease stage II, OSA, GERD, and recent diagnosis of Crohn's colitis managed with prednisone who presentedto the [place] emergency department with 2-3 days of fevers, nausea, vomiting, and nonbloody diarrhea. Patient reports the insidious development of abdominal pain approximately 6 months ago and underwent a colonoscopy in July of this year with a large ulcer at the terminal ileum and several punctate deep ulcers in the right colon, with pathology consistent with Crohn's. She has been taking daily prednisone since that time, but unfortunately developed severe mid abdominal pain which rapidly progressed to include fevers, nausea, nonbloody vomiting, and nonbloody diarrhea. She describes her abdominal pain as severe, cramping in character, localized to the lower abdomen, and associated with loss of appetite. She denies any chest pain, palpitations, dyspnea, lower extremity edema, rash, or wound.On 10/20 her resp status decompensated and patient was moved to ICU. Work up demonstrated bilateral infiltrates and ground glass opacity. 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 Patient is now treated with bactrim IV and steroids, ID is on board. Patient resp status stabilizes with the use of BIPAP and didn't require intubation. On 10/23 was move to stepdown and back to [facility] service.   Clinical Impression  Pt admitted with above diagnosis. Pt currently with functional limitations due to the deficits listed below (see PT Problem List). Pt very weak. Will need SNF as she does not have anyone to care for her at home. Pt agrees to SNF.  Pt will benefit from skilled PT to increase their independence and safety with mobility to allow discharge to the venue  listed below.      Follow Up Recommendations SNF (vestibular rehab);Supervision/Assistance - 24 hour    Equipment Recommendations  Other (comment) (TBA)    Recommendations for Other Services OT consult     Precautions / Restrictions Precautions Precautions: Fall Restrictions Weight Bearing Restrictions: No      Mobility  Bed Mobility               General bed mobility comments: Pt was on 3N1 upon arrival.  NT had helped her up.  Transfers Overall transfer level: Needs assistance Equipment used: Rolling walker (2 wheeled) Transfers: Sit to/from Stand Sit to Stand: Mod assist;+2 physical assistance         General transfer comment: Pt needed assist for power up.  Pt needed steadying assist once up.  Pt getting fatigued quickly with weakness in bil knees and hips.  Moved 3N1 and had to bring chair up to pt as she felt she couldn't step around to chair.   Ambulation/Gait                Stairs            Wheelchair Mobility    Modified Rankin (Stroke Patients Only)       Balance Overall balance assessment: Needs assistance;History of Falls         Standing balance support: Bilateral upper extremity supported;During functional activity Standing balance-Leahy Scale: Poor Standing balance comment: requires UE support on RW and mod assist for standing.                              Pertinent Vitals/Pain Pain Assessment: Faces Faces Pain Scale: Hurts little  more Pain Location: generalized Pain Descriptors / Indicators: Aching;Sore Pain Intervention(s): Limited activity within patient's tolerance;Monitored during session;Repositioned  VSS on 6LO2 with sats 95%, BP 121/73 sitting and standing 124/83.  HR 105 bpm    Home Living Family/patient expects to be discharged to:: Private residence Living Arrangements: Spouse/significant other Available Help at Discharge: Friend(s);Family;Available PRN/intermittently (boyfriend lives with pt  but works at night, family intermite) Type of Home: Apartment Home Access: Stairs to enter Entrance Stairs-Rails: Psychiatric nurse of Steps: [place]: Two level (Pt lives on 2nd floor apt) Home Equipment: None Additional Comments: Pt states she has a neighbor that will check on her  Pt was working at [employer] until her dad passed away 11/13/2022.     Prior Function Level of Independence: Independent         Comments: Pt working at [employer] until dad passed away 2022-11-13.     Hand Dominance        Extremity/Trunk Assessment   Upper Extremity Assessment: Defer to OT evaluation           Lower Extremity Assessment: RLE deficits/detail;LLE deficits/detail RLE Deficits / Details: grossly 3-/5 LLE Deficits / Details: grossly 3-/5  Cervical / Trunk Assessment: Kyphotic  Communication   Communication: No difficulties  Cognition Arousal/Alertness: Awake/alert Behavior During Therapy: Flat affect Overall Cognitive Status: Within Functional Limits for tasks assessed                      General Comments General comments (skin integrity, edema, etc.): Pt with dizziness each stand.  Looked at Vestibular system with pt positive for bil hypofunction.  Initiated x1 exercises with pt.      Exercises Other Exercises Other Exercises: x1 exercises   Assessment/Plan    PT Assessment Patient needs continued PT services  PT Problem List Decreased activity tolerance;Decreased balance;Decreased strength;Decreased mobility;Decreased coordination;Decreased knowledge of use of DME;Decreased safety awareness;Decreased knowledge of precautions;Pain          PT Treatment Interventions DME instruction;Gait training;Functional mobility training;Therapeutic activities;Therapeutic exercise;Balance training;Patient/family education    PT Goals (Current goals can be found in the Care Plan section)  Acute Rehab PT Goals Patient Stated Goal: to  get better PT Goal Formulation: With patient Time For Goal Achievement: 11/27/15 Potential to Achieve Goals: Good    Frequency Min 3X/week   Barriers to discharge Decreased caregiver support;Other (comment)      Co-evaluation               End of Session Equipment Utilized During Treatment: Gait belt;Oxygen Activity Tolerance: Patient limited by fatigue Patient left: in chair;with call bell/phone within reach;with chair alarm set Nurse Communication: Mobility status         Time: [id number] PT Time Calculation (min) (ACUTE ONLY): 21 min   Charges:   PT Evaluation $PT Eval Moderate Complexity: 1 Procedure     PT G CodesDenice Paradise 2015-12-01, 1:44 PM Wynton Hufstetler,PT Acute Rehabilitation [phone] (pager)

## 2015-11-13 NOTE — Progress Notes (Addendum)
Subjective:  She reports feeling lightheaded and dizzy both supine and well attempting to get out of bed. Still short of breath but improving overall   Antibiotics:  Anti-infectives    Start     Dose/Rate Route Frequency Ordered Stop   11/13/15 0000  dolutegravir (TIVICAY) 50 MG tablet     50 mg Oral Daily 11/12/15 1622     11/13/15 0000  emtricitabine-tenofovir AF (DESCOVY) 200-25 MG tablet     1 tablet Oral Daily 11/12/15 1622     11/12/15 1000  sulfamethoxazole-trimethoprim (BACTRIM DS,SEPTRA DS) 800-160 MG per tablet 1 tablet  Status:  Discontinued     1 tablet Oral Every 12 hours 11/12/15 0729 11/12/15 0829   11/12/15 1000  sulfamethoxazole-trimethoprim (BACTRIM DS,SEPTRA DS) 800-160 MG per tablet 2 tablet     2 tablet Oral 3 times daily 11/12/15 0829 11/27/15 0959   11/12/15 1000  azithromycin (ZITHROMAX) tablet 1,200 mg     1,200 mg Oral Weekly 11/12/15 0927     11/07/15 1700  dolutegravir (TIVICAY) tablet 50 mg     50 mg Oral Daily 11/07/15 1556     11/07/15 1700  emtricitabine-tenofovir AF (DESCOVY) 200-25 MG per tablet 1 tablet     1 tablet Oral Daily 11/07/15 1556     11/06/15 1400  sulfamethoxazole-trimethoprim (BACTRIM) 320 mg of trimethoprim in dextrose 5 % 500 mL IVPB  Status:  Discontinued     320 mg of trimethoprim 346.7 mL/hr over 90 Minutes Intravenous Every 6 hours 11/06/15 1305 11/06/15 1310   11/06/15 1400  sulfamethoxazole-trimethoprim (BACTRIM) 320 mg of trimethoprim in dextrose 5 % 500 mL IVPB  Status:  Discontinued     320 mg of trimethoprim 346.7 mL/hr over 90 Minutes Intravenous Every 8 hours 11/06/15 1310 11/06/15 1312   11/06/15 1400  sulfamethoxazole-trimethoprim (BACTRIM) 320 mg of trimethoprim in dextrose 5 % 500 mL IVPB  Status:  Discontinued     320 mg of trimethoprim 346.7 mL/hr over 90 Minutes Intravenous Every 6 hours 11/06/15 1312 11/12/15 0729   11/05/15 1530  vancomycin (VANCOCIN) IVPB 1000 mg/200 mL premix     1,000 mg 200 mL/hr  over 60 Minutes Intravenous  Once 11/05/15 1456 11/05/15 1710   11/03/15 0600  vancomycin (VANCOCIN) IVPB 750 mg/150 ml premix  Status:  Discontinued     750 mg 150 mL/hr over 60 Minutes Intravenous Every 12 hours 11/03/15 0550 11/03/15 1350   11/03/15 0600  piperacillin-tazobactam (ZOSYN) IVPB 3.375 g  Status:  Discontinued     3.375 g 12.5 mL/hr over 240 Minutes Intravenous Every 8 hours 11/03/15 0550 11/08/15 1020   11/02/15 2000  ciprofloxacin (CIPRO) tablet 500 mg  Status:  Discontinued     500 mg Oral 2 times daily 11/02/15 0936 11/03/15 0536   11/02/15 1400  metroNIDAZOLE (FLAGYL) tablet 500 mg  Status:  Discontinued     500 mg Oral Every 8 hours 11/02/15 0936 11/03/15 0536   10/31/15 0800  ciprofloxacin (CIPRO) IVPB 400 mg  Status:  Discontinued     400 mg 200 mL/hr over 60 Minutes Intravenous Every 12 hours 10/19/2015 2308 11/02/15 0936   10/31/15 0300  metroNIDAZOLE (FLAGYL) IVPB 500 mg  Status:  Discontinued     500 mg 100 mL/hr over 60 Minutes Intravenous Every 8 hours 11/13/2015 2239 11/02/15 0936   11/13/2015 1915  ciprofloxacin (CIPRO) IVPB 400 mg     400 mg 200 mL/hr over 60 Minutes Intravenous  Once  11/02/2015 1909 10/18/2015 2054   11/08/2015 1915  metroNIDAZOLE (FLAGYL) IVPB 500 mg     500 mg 100 mL/hr over 60 Minutes Intravenous  Once 10/18/2015 1909 10/24/2015 2047      Medications: Scheduled Meds: . azithromycin  1,200 mg Oral Weekly  . diphenoxylate-atropine  5 mL Oral QID  . dolutegravir  50 mg Oral Daily  . emtricitabine-tenofovir AF  1 tablet Oral Daily  . enoxaparin (LOVENOX) injection  40 mg Subcutaneous Q24H  . furosemide  40 mg Oral Daily  . insulin aspart  0-5 Units Subcutaneous QHS  . insulin aspart  0-9 Units Subcutaneous TID WC  . ipratropium-albuterol  3 mL Nebulization TID  . metoprolol tartrate  50 mg Oral BID  . pantoprazole  40 mg Oral Daily  . predniSONE  60 mg Oral QAC breakfast  . saccharomyces boulardii  250 mg Oral BID  . sodium chloride flush   10-40 mL Intracatheter Q12H  . sodium chloride flush  3 mL Intravenous Q12H  . sodium chloride  1 g Oral BID WC  . sulfamethoxazole-trimethoprim  2 tablet Oral TID    Objective: Weight change:   Intake/Output Summary (Last 24 hours) at 11/13/15 1816 Last data filed at 11/13/15 1200  Gross per 24 hour  Intake               10 ml  Output              775 ml  Net             -765 ml   Blood pressure 107/71, pulse 90, temperature 99 F (37.2 C), temperature source Oral, resp. rate (!) 29, height _0  (1.626 m), weight 185 lb (83.9 kg), SpO2 94 %. Temp:  [98.7 F (37.1 C)-100.3 F (37.9 C)] 99 F (37.2 C) (10/27 1644) Pulse Rate:  [77-94] 90 (10/27 1644) Resp:  [21-33] 29 (10/27 1644) BP: (101-141)/(58-82) 107/71 (10/27 1644) SpO2:  [91 %-96 %] 94 % (10/27 1644) FiO2 (%):  [50 %] 50 % (10/26 2258)  Physical Exam: General: Alert and awake, oriented x3, Wearing BIPAP HEENT: anicteric sclera,  EOMI, oropharynx clear and without exudate Cardiovascular: regular rate, normal r,  no murmur rubs or gallops Pulmonary:  rhonchi anteriorly, difficulty with deep breath which causes coughing Gastrointestinal l:  obese soft nontender, nondistended, normal bowel sounds, Skin, soft tissue: no rashes Neuro: nonfocal, strength and sensation intact CBC:  CBC Latest Ref Rng & Units 11/12/2015 11/11/2015 11/10/2015  WBC 4.0 - 10.5 K/uL 7.6 7.3 6.1  Hemoglobin 12.0 - 15.0 g/dL 8.1(L) 8.5(L) 8.6(L)  Hematocrit 36.0 - 46.0 % 23.8(L) 25.1(L) 25.2(L)  Platelets 150 - 400 K/uL 295 287 231      BMET  Recent Labs  11/12/15 0408 11/13/15 0841  NA 122* 124*  K 5.2* 5.1  CL 90* 92*  CO2 20* 19*  GLUCOSE 151* 89  BUN 27* 29*  CREATININE 1.73* 1.80*  CALCIUM 8.0* 8.3*   Sedimentation Rate No results for input(s): ESRSEDRATE in the last 72 hours. Micro Results: Recent Results (from the past 720 hour(s))  C difficile quick screen w PCR reflex     Status: None   Collection Time: 10/31/15  10:00 AM  Result Value Ref Range Status   C Diff antigen NEGATIVE NEGATIVE Final   C Diff toxin NEGATIVE NEGATIVE Final   C Diff interpretation No C. difficile detected.  Final  Culture, blood (routine x 2)     Status: None  Collection Time: 11/03/15  2:24 AM  Result Value Ref Range Status   Specimen Description BLOOD LEFT ANTECUBITAL  Final   Special Requests BOTTLES DRAWN AEROBIC AND ANAEROBIC 5CC  Final   Culture NO GROWTH 5 DAYS  Final   Report Status 11/08/2015 FINAL  Final  Culture, blood (routine x 2)     Status: None   Collection Time: 11/03/15  2:30 AM  Result Value Ref Range Status   Specimen Description BLOOD LEFT HAND  Final   Special Requests BOTTLES DRAWN AEROBIC AND ANAEROBIC 5CC  Final   Culture NO GROWTH 5 DAYS  Final   Report Status 11/08/2015 FINAL  Final  MRSA PCR Screening     Status: None   Collection Time: 11/03/15  5:09 AM  Result Value Ref Range Status   MRSA by PCR NEGATIVE NEGATIVE Final    Comment:        The GeneXpert MRSA Assay (FDA approved for NASAL specimens only), is one component of a comprehensive MRSA colonization surveillance program. It is not intended to diagnose MRSA infection nor to guide or monitor treatment for MRSA infections.   Culture, blood (routine x 2)     Status: None   Collection Time: 11/05/15  9:50 AM  Result Value Ref Range Status   Specimen Description BLOOD LEFT HAND  Final   Special Requests BOTTLES DRAWN AEROBIC ONLY  5CC  Final   Culture NO GROWTH 5 DAYS  Final   Report Status 11/10/2015 FINAL  Final  Culture, blood (routine x 2)     Status: None   Collection Time: 11/05/15  9:58 AM  Result Value Ref Range Status   Specimen Description BLOOD LEFT WRIST  Final   Special Requests BOTTLES DRAWN AEROBIC ONLY  5CC  Final   Culture NO GROWTH 5 DAYS  Final   Report Status 11/10/2015 FINAL  Final  C difficile quick scan w PCR reflex     Status: None   Collection Time: 11/05/15 11:05 AM  Result Value Ref Range  Status   C Diff antigen NEGATIVE NEGATIVE Final   C Diff toxin NEGATIVE NEGATIVE Final   C Diff interpretation No C. difficile detected.  Final  Respiratory Panel by PCR     Status: None   Collection Time: 11/07/15 11:47 AM  Result Value Ref Range Status   Adenovirus NOT DETECTED NOT DETECTED Final   Coronavirus 229E NOT DETECTED NOT DETECTED Final   Coronavirus HKU1 NOT DETECTED NOT DETECTED Final   Coronavirus NL63 NOT DETECTED NOT DETECTED Final   Coronavirus OC43 NOT DETECTED NOT DETECTED Final   Metapneumovirus NOT DETECTED NOT DETECTED Final   Rhinovirus / Enterovirus NOT DETECTED NOT DETECTED Final   Influenza A NOT DETECTED NOT DETECTED Final   Influenza B NOT DETECTED NOT DETECTED Final   Parainfluenza Virus 1 NOT DETECTED NOT DETECTED Final   Parainfluenza Virus 2 NOT DETECTED NOT DETECTED Final   Parainfluenza Virus 3 NOT DETECTED NOT DETECTED Final   Parainfluenza Virus 4 NOT DETECTED NOT DETECTED Final   Respiratory Syncytial Virus NOT DETECTED NOT DETECTED Final   Bordetella pertussis NOT DETECTED NOT DETECTED Final   Chlamydophila pneumoniae NOT DETECTED NOT DETECTED Final   Mycoplasma pneumoniae NOT DETECTED NOT DETECTED Final    Studies/Results: No results found.    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 female with  female with  Hx of HTN, CKD, Crohn's (though latter was a recent diagnosis) with entero-entero fistula now found to have newly diagnosed advanced HIV disease/AIDS, CD 4 count < 10,  with likely PCP pneumonia  #1 HIV/AIDS: -- continue with treament for PCP pneumonia with bactrim and steroids -- awaiting HIV viral load reflex genotype, HLA [id number] -- continue on TIVICAY and DESCOVY which will drop her VL rapidly and allow CD4 to rise (she will need copay cards from [place] for these meds when she nears DC and we can arrange to fill at [facility] prior to DC (insurance permitting)  #2 oi proph: - will check toxoplasmosis total ab, cryptococcal ab - will start azithromycin 1265m Q wk due to CD 4 count < 50 - presently on bactrim oral  SHE DOES NOT WANT HER DIAGNOSIS DISCLOSED TO [place] #2 PCP PNA: continue IV Bactrim and steroids. After 21 days, will need to be on bactrim DS daily dosing  #3 hyponatremia = likley from fluid hydration given while on bactrim. Consider giving more lasix   #4 aki = likely from bactrim but has been stable. Continue on monitor. Will dose adjust bactrim.  #5 dizziness = consider vestibular PT to see if it helps symptoms.  SCarlyle Basques10/27/2017, 6:16 PM

## 2015-11-13 NOTE — Care Management Note (Signed)
Case Management Note  Patient Details  Name: Angeliah Tabor MRN: [id number] Date of Birth: [birth year]  Subjective/Objective:  Pt to d/c to SNF - CM and CSW attempting to get copay card from [facility] before d/c.  Will await call back from [facility].  CSW talking with SNF liaison re meds.                         Expected Discharge Plan:  [place]  In-House Referral:  Clinical Social Work  Discharge planning Services  CM Consult  PStatus of Service:  In process, will continue to follow  Girard Cooter, RN 11/13/2015, 2:47 PM

## 2015-11-13 NOTE — NC FL2 (Signed)
[place] MEDICAID FL2 LEVEL OF CARE SCREENING TOOL     IDENTIFICATION  Patient Name: Nancy Acevedo Birthdate: 17-Jan-1961 Sex: female Admission Date (Current Location): 11/06/2015  [facility] and [place] Number:  Herbalist and Address:  [facility] [place], [place] [address] [place], [place] [address]      Provider Number: [id number]  Attending Physician Name and Address:  Nita Sells, MD  Relative Name and Phone Number:       Current Level of Care: Hospital Recommended Level of Care: [place] Prior Approval Number:    Date Approved/Denied:   PASRR Number: [id number]  Discharge Plan: SNF    Current Diagnoses: Patient Active Problem List   Diagnosis Date Noted  . Acute renal failure superimposed on stage 2 chronic kidney disease ([place])   . PCP (pneumocystis jiroveci pneumonia) ([place]) 11/07/2015  . AIDS ([place]) 11/07/2015  . Hypoxia   . Pressure injury of skin 11/04/2015  . Lobar pneumonia ([place]) 11/03/2015  . Acute hypoxemic respiratory failure ([place]) 11/03/2015  . Crohn's disease of large intestine with fistula ([place])   . Crohn's disease of both small and large intestine with fistula ([place]) 10/22/2015  . Essential hypertension, benign 10/31/2015  . Normocytic anemia 11/15/2015  . Thrombocytopenia ([place]) 10/27/2015  . CKD (chronic kidney disease), stage II 10/18/2015  . Sepsis, unspecified organism ([place]) 11/06/2015  . Hypertensive urgency 10/27/2015  . Diarrhea 10/03/2015  . Crohn's colitis ([place]) 08/07/2015  . B12 deficiency 04/15/2015  . Gastroesophageal reflux disease with esophagitis 04/15/2015  . BPPV (benign paroxysmal positional vertigo) 02/19/2015  . Abnormal nuclear stress test   . Faintness   . Chest pain 02/09/2015  . Hypokalemia 02/09/2015  . Syncope 02/09/2015  . Greater trochanteric bursitis of left hip 01/15/2015  . Left hip pain 12/01/2014  . Head or neck swelling, mass, or lump 12/01/2014  .  Dysphagia 10/14/2014  . Odynophagia 10/14/2014  . Abnormal esophagram 10/14/2014  . Oral candidiasis 10/14/2014  . Pain in joint, ankle and foot 10/21/2013  . Muscle spasm of back 02/06/2013  . Nonallopathic lesion of thoracic region 02/06/2013  . Rash, drug 11/20/2012  . OSA (obstructive sleep apnea)   . Depression   . Obese   . Abnormal LFTs (liver function tests) 02/17/2011  . Controlled type 2 diabetes mellitus with stage 2 chronic kidney disease, without long-term current use of insulin ([place]) 12/08/2010  . Hypertension   . Headaches, cluster     Orientation RESPIRATION BLADDER Height & Weight     Self, Time, Situation, Place  O2 (Bipap at night; HFNC) Continent Weight: 83.9 kg (185 lb) Height:  5\' 4"  (162.6 cm)  BEHAVIORAL SYMPTOMS/MOOD NEUROLOGICAL BOWEL NUTRITION STATUS      Continent Diet (Please see DC Summary)  AMBULATORY STATUS COMMUNICATION OF NEEDS Skin   Extensive Assist Verbally PU Stage and Appropriate Care (Pressure injury stage II on buttocks)                       Personal Care Assistance Level of Assistance  Bathing, Feeding, Dressing Bathing Assistance: Maximum assistance Feeding assistance: Independent Dressing Assistance: Limited assistance     Functional Limitations Info             SPECIAL CARE FACTORS FREQUENCY  PT (By licensed PT)     PT Frequency: 5x/week              Contractures Contractures Info: Not present    Additional Factors Info  Insulin Sliding Scale Code Status Info: Full Allergies Info: Lexapro Escitalopram, Losartan, Spironolactone, Clonidine Derivatives   Insulin Sliding Scale Info: insulin aspart (novoLOG) injection 0-5 Units;insulin aspart (novoLOG) injection 0-9 Units       Current Medications (11/13/2015):  This is the current hospital active medication list Current Facility-Administered Medications  Medication Dose Route Frequency Provider Last Rate Last Dose  . azithromycin (ZITHROMAX) tablet 1,200  mg  1,200 mg Oral Weekly Carlyle Basques, MD   1,200 mg at 11/12/15 1001  . diphenoxylate-atropine (LOMOTIL) 2.5-0.025 MG/5ML liquid 5 mL  5 mL Oral QID Nita Sells, MD      . dolutegravir (TIVICAY) tablet 50 mg  50 mg Oral Daily Truman Hayward, MD   50 mg at 11/13/15 0919  . emtricitabine-tenofovir AF (DESCOVY) 200-25 MG per tablet 1 tablet  1 tablet Oral Daily Truman Hayward, MD   1 tablet at 11/13/15 0919  . enoxaparin (LOVENOX) injection 40 mg  40 mg Subcutaneous Q24H Manus Gunning, MD   40 mg at 11/13/15 0919  . furosemide (LASIX) tablet 40 mg  40 mg Oral Daily Nita Sells, MD   40 mg at 11/13/15 0920  . hydrALAZINE (APRESOLINE) injection 10 mg  10 mg Intravenous Q4H PRN Vianne Bulls, MD   10 mg at 11/02/15 1749  . insulin aspart (novoLOG) injection 0-5 Units  0-5 Units Subcutaneous QHS Vianne Bulls, MD   3 Units at 11/08/15 1158  . insulin aspart (novoLOG) injection 0-9 Units  0-9 Units Subcutaneous TID WC Vianne Bulls, MD   2 Units at 11/13/15 1306  . ipratropium-albuterol (DUONEB) 0.5-2.5 (3) MG/3ML nebulizer solution 3 mL  3 mL Nebulization TID Barton Dubois, MD   3 mL at 11/12/15 1941  . metoprolol (LOPRESSOR) tablet 50 mg  50 mg Oral BID Vianne Bulls, MD   50 mg at 11/13/15 0921  . ondansetron (ZOFRAN) injection 4 mg  4 mg Intravenous Q6H PRN Anders Simmonds, MD   4 mg at 11/12/15 1257  . pantoprazole (PROTONIX) EC tablet 40 mg  40 mg Oral Daily Barton Dubois, MD   40 mg at 11/13/15 0920  . predniSONE (DELTASONE) tablet 60 mg  60 mg Oral QAC breakfast Nita Sells, MD   60 mg at 11/13/15 0921  . saccharomyces boulardii (FLORASTOR) capsule 250 mg  250 mg Oral BID Barton Dubois, MD   250 mg at 11/13/15 0920  . sodium chloride flush (NS) 0.9 % injection 10-40 mL  10-40 mL Intracatheter Q12H Gatha Mayer, MD   10 mL at 11/12/15 2216  . sodium chloride flush (NS) 0.9 % injection 10-40 mL  10-40 mL Intracatheter PRN Gatha Mayer, MD   10 mL at  11/10/15 2248  . sodium chloride flush (NS) 0.9 % injection 3 mL  3 mL Intravenous Q12H Vianne Bulls, MD   3 mL at 11/12/15 2216  . sodium chloride tablet 1 g  1 g Oral BID WC Nita Sells, MD   1 g at 11/13/15 0920  . sulfamethoxazole-trimethoprim (BACTRIM DS,SEPTRA DS) 800-160 MG per tablet 2 tablet  2 tablet Oral TID Reginia Naas, [facility]   2 tablet at 11/13/15 0919     Discharge Medications: Please see discharge summary for a list of discharge medications.  Relevant Imaging Results:  Relevant Lab Results:   Additional Information SSN: [place], [place]

## 2015-11-13 NOTE — Progress Notes (Signed)
Pharmacy Antibiotic Note Beverlie Kurihara is a 55 y.o. female admitted on 11/11/2015.    Continues on day #8/21 of Bactrim for presumed PCP PNA. Switched to PO Bactrim on 10/26. ID recommends lower dose of PO Bactrim after 3 weeks of treatment. Tivicay/Descovy started for new HIV dx. CD4 <10, HIV RNA and HLA labs in process. Starting Azithromycin for OI prophylaxis as well. Afebrile, WBC wnl.  SCr trending up to 1.73, CrCl ~ 33m/min.  BMP not obtained yet today.   Plan: 1. Continue Bactrim 2 DS tablets TID for the remainder of 3 weeks, then transition to 1 DS tablet daily 2. Monitor renal function closely (may need to dose adjust Bactrim if CrCl drops below 331mmin), electrolytes 3. Bactrim may also be contributing to rise in SCr   Height: '5\' 4"'$  (162.6 cm) Weight: 185 lb (83.9 kg) IBW/kg (Calculated) : 54.7  Temp (24hrs), Avg:98.6 F (37 C), Min:98 F (36.7 C), Max:99.1 F (37.3 C)   Recent Labs Lab 11/08/15 0346 11/09/15 0420 11/10/15 0320 11/11/15 0500 11/12/15 0408  WBC 6.3 6.2 6.1 7.3 7.6  CREATININE 1.58* 1.51* 1.47* 1.62* 1.73*    Estimated Creatinine Clearance: 39.4 mL/min (by C-G formula based on SCr of 1.73 mg/dL (H)).    Allergies  Allergen Reactions  . Lexapro [Escitalopram] Itching  . Losartan   . Spironolactone     rash  . Clonidine Derivatives Palpitations   Antimicrobials this admission:  Vanco 10/17 x1 (750 mg); 10/19 x 1 (1g) Zosyn 10/17 >>10/22 Cipro 10/13 >> 10/16 Flagyl 10/13 >> 10/16  Bactrim 10/20 >>  Microbiology results:  10/14 Cdiff >> negative 10/16 Quantiferon Gold >> indeterminate so PPD placed 10/17 - read as negative 10/17 BCx: neg 10/17 MRSA PCR: neg 10/19 BCx2: neg 10/19 Cdif: neg x2 10/20 HIV: pos 10/21 HIV RNA: ip 10/21 CD4: <10 10/21 HLA: ip 10/21 Resp PCR: neg  Thank you for allowing pharmacy to be a part of this patient's care.  AnVincenza HewsPharmD, BCPS 11/13/2015, 8:03 AM Pager: [phone]

## 2015-11-13 NOTE — Clinical Social Work Note (Signed)
Clinical Social Work Assessment  Patient Details  Name: Nancy Acevedo MRN: [id number] Date of Birth: 10/04/1960  Date of referral:  11/13/15               Reason for consult:  Facility Placement                Permission sought to share information with:  Facility Sport and exercise psychologist, Family Supports Permission granted to share information::  Yes, Verbal Permission Granted  Name::     Nature conservation officer::  SNFs  Relationship::  Daughter  Contact Information:   Bedelia Person [phone]  Housing/Transportation Living arrangements for the past 2 months:  Apartment Source of Information:  Patient Patient Interpreter Needed:  None Criminal Activity/Legal Involvement Pertinent to Current Situation/Hospitalization:  No - Comment as needed Significant Relationships:  Adult Children, Siblings, Parents Lives with:  Self Do you feel safe going back to the place where you live?  No Need for family participation in patient care:  No (Coment)  Care giving concerns:  CSW received consult for possible SNF placement at time of discharge. CSW met with patient regarding PT recommendation of SNF placement at time of discharge. Patient reports living alone and given patient's current physical needs and fall risk she does not want to be by herself. Patient expressed understanding of PT recommendation and is agreeable to SNF placement at time of discharge. CSW to continue to follow and assist with discharge planning needs.   Social Worker assessment / plan:  CSW spoke with patient concerning possibility of rehab at [facility] before returning home.  Employment status:  Other (Comment) Insurance information:  Managed Care PT Recommendations:  [place] / Referral to community resources:  [place]  Patient/Family's Response to care:  Patient recognizes need for rehab before returning home and is agreeable to a SNF in [place].   Patient/Family's Understanding of  and Emotional Response to Diagnosis, Current Treatment, and Prognosis:  Patient/family is realistic regarding therapy needs and expressed being hopeful for SNF placement. Patient expressed understanding of CSW role and discharge process. No questions/concerns about plan or treatment.    Emotional Assessment Appearance:  Appears stated age Attitude/Demeanor/Rapport:  Other (Appropriate) Affect (typically observed):  Accepting, Appropriate Orientation:  Oriented to Self, Oriented to Place, Oriented to  Time, Oriented to Situation Alcohol / Substance use:  Not Applicable Psych involvement (Current and /or in the community):  No (Comment)  Discharge Needs  Concerns to be addressed:  Care Coordination Readmission within the last 30 days:  No Current discharge risk:  None Barriers to Discharge:  Continued Medical Work up   [employer], [place] 11/13/2015, 2:32 PM

## 2015-11-13 NOTE — Progress Notes (Signed)
Patient's BiPAP removed and patient placed on 8L nasal cannula.

## 2015-11-13 NOTE — Progress Notes (Signed)
[facility] PROGRESS NOTE  Nancy Acevedo ZDG:[id number] DOB: 22-Jan-1960 DOA: 11/13/2015 PCP: Binnie Rail, MD  Interim summary and HPI 73 ? Hypertension,  chronic kidney disease stage II,  OSA,  GERD, and  recent diagnosis of Crohn's colitis managed with prednisone  presented to the [facility] 11/16/2015   2-3 days of fevers, nausea, vomiting, and nonbloody diarrhea. insidious development of abdominal pain approximately 6 months ago and underwent a colonoscopy in July of this year with a large ulcer at the terminal ileum and several punctate deep ulcers in the right colon, with pathology consistent with Crohn's Admit with abdominal pain as severe, cramping in character, localized to the lower abdomen, and associated with loss of appetite.  On 10/20 her resp status decompensated and patient was moved to ICU. Work up demonstrated bilateral infiltrates and ground glass opacity. 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  bactrim IV and steroids, ID is on board.  Patient resp status stabilizes with the use of BIPAP On 10/23 was move to stepdown and back to [facility] service.   Assessment/Plan: Sepsis: due to AIDS/PCP PNA ID on board, will follow rec's -patient CD4 count < 10 -treating her with IV bactrim and steroids-half transitioned both to oral formulations on 10/25 -also started on Tivicay and Descovy -HLA [id number] and viral load reflex still pending -TOxo and Crytpo neg -3 weeks Rx for PCP to stop on 11/26/15 and would continue steroids up until then -Repeat blood cultures w/o growth  -will need Assitance with meds s well as referrals to [facility] for furhter HIV care  Acute resp failure with hypoxia: due to PCP PNA, ARDS and ALI. -no intubation required -patient now using BIPAP QHS -oxygen supplementation through [place] during the day (high flow oxygen), patient desats to 80% without this -cont lasix 40 PO, with close follow up to electrolytes and renal  function -continue abx's and steroids for PCP -2 view x-ray 10/25 showing clearing infiltrates  Crohn's ileocolitis with entero-enterofistula and flare with diarrhea -diet has been advanced to heart healthy/carb modified diet; well tolerated -11/09/2015 CT abdomen and pelvis--long segment of bowel wall thickening with surrounding inflammation from terminal ileum to the sigmoid with 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 -C diff negative  -Will follow GI rec's (at this point, focus on tx for HIV and PCP; outpatient follow up with her primary gastroenterologist (Dr. Ardis Hughs) and at that time repeat colonoscopy once HIV is well controlled to reevaluate diagnosis of IBD vs GI inflammation from HIV). -CMV was ordered  -Diarrhea 3 stools daily however not worse and in fact seems better according to patient  Chest pain/Elevated troponin -demand ischemia in setting of sepsis -with recent work up during this year, 02/11/15 cardiac cath--normal coronaries -no signs of acute ischemia on EKG -Echocardiogram--EF 55-65%, no WMA, trivial MR/TR  Indeterminant QuanteriFERON PPD was placed so far negative  -checking for latent TB for remicade -NO clinical suspicion for active pulm TB at this moment -per ID, consider repeat TB testing once immune system is reconstituted   Acute on chronic kidney disease: stage 2 at baseline -Baseline creatinine 1-1.2 -currently 1.8  Hypertension -metoprolol tartrate 50 twice a day -BP is now stable -pt endorsed poor compliance with meds at home missing 2-3 days per week approx. -s dizzy when she stands  -d/c Amlodipine 10 on 10/27 given conitnued symptoms  Diabetes mellitus type 2 - stable -10/02/2015 hemoglobin A1c 6.6 -Continue NovoLog sliding scale  -anticipate  CBG's elevated due to steroids  GERD -10/22/14--EGD--The esophageal mucosa was abnormal appearing throughout but mainly in the proximal 1/2 of the  esophagus. -will continue PPI  Thrombocytopenia on admission 111 -likely due to sepsis -currently 221 -will monitor trend -no signs of bleeding appreciated  Diarrhea: -due to cron's  -will add florastor -per GI, ok to use loperamide or lomotil if needed  -neg C. Diff  Probable hypervolemic Hyponatremia and a setting of chronic diarrhea, hypokalemia -due to ongoing diarrhea, and for her hyponatremia PCP PNA also contributing -will replete electrolytes and follow trend. -Replaced orally with salt tablets and liberalize diet to regular -Lasix changed to by mouth formulation 11/11/2015 -Potassium discontinued as mildly hyperkalemic 10/25 -Repeat labs in a.m.  Anemia -  -AOCD  -transfused 2 units PRBCs 11/01/15 -Hgb stable -no signs of bleeding appreciated  OSA -currently on BIPAP QHS and tolerating this well  Code Status: Full Family Communication: no family at bedside  Disposition Plan: Continue treatment as above  Consultants:  GI (sign off)  PCCM (sign off)  ID  Procedures:  See below for x-ray reports   Antibiotics: Anti-infectives    Start     Dose/Rate Route Frequency Ordered Stop   11/13/15 0000  dolutegravir (TIVICAY) 50 MG tablet     50 mg Oral Daily 11/12/15 1622     11/13/15 0000  emtricitabine-tenofovir AF (DESCOVY) 200-25 MG tablet     1 tablet Oral Daily 11/12/15 1622     11/12/15 1000  sulfamethoxazole-trimethoprim (BACTRIM DS,SEPTRA DS) 800-160 MG per tablet 1 tablet  Status:  Discontinued     1 tablet Oral Every 12 hours 11/12/15 0729 11/12/15 0829   11/12/15 1000  sulfamethoxazole-trimethoprim (BACTRIM DS,SEPTRA DS) 800-160 MG per tablet 2 tablet     2 tablet Oral 3 times daily 11/12/15 0829 11/27/15 0959   11/12/15 1000  azithromycin (ZITHROMAX) tablet 1,200 mg     1,200 mg Oral Weekly 11/12/15 0927     11/07/15 1700  dolutegravir (TIVICAY) tablet 50 mg     50 mg Oral Daily 11/07/15 1556     11/07/15 1700  emtricitabine-tenofovir AF  (DESCOVY) 200-25 MG per tablet 1 tablet     1 tablet Oral Daily 11/07/15 1556     11/06/15 1400  sulfamethoxazole-trimethoprim (BACTRIM) 320 mg of trimethoprim in dextrose 5 % 500 mL IVPB  Status:  Discontinued     320 mg of trimethoprim 346.7 mL/hr over 90 Minutes Intravenous Every 6 hours 11/06/15 1305 11/06/15 1310   11/06/15 1400  sulfamethoxazole-trimethoprim (BACTRIM) 320 mg of trimethoprim in dextrose 5 % 500 mL IVPB  Status:  Discontinued     320 mg of trimethoprim 346.7 mL/hr over 90 Minutes Intravenous Every 8 hours 11/06/15 1310 11/06/15 1312   11/06/15 1400  sulfamethoxazole-trimethoprim (BACTRIM) 320 mg of trimethoprim in dextrose 5 % 500 mL IVPB  Status:  Discontinued     320 mg of trimethoprim 346.7 mL/hr over 90 Minutes Intravenous Every 6 hours 11/06/15 1312 11/12/15 0729   11/05/15 1530  vancomycin (VANCOCIN) IVPB 1000 mg/200 mL premix     1,000 mg 200 mL/hr over 60 Minutes Intravenous  Once 11/05/15 1456 11/05/15 1710   11/03/15 0600  vancomycin (VANCOCIN) IVPB 750 mg/150 ml premix  Status:  Discontinued     750 mg 150 mL/hr over 60 Minutes Intravenous Every 12 hours 11/03/15 0550 11/03/15 1350   11/03/15 0600  piperacillin-tazobactam (ZOSYN) IVPB 3.375 g  Status:  Discontinued     3.375 g 12.5  mL/hr over 240 Minutes Intravenous Every 8 hours 11/03/15 0550 11/08/15 1020   11/02/15 2000  ciprofloxacin (CIPRO) tablet 500 mg  Status:  Discontinued     500 mg Oral 2 times daily 11/02/15 0936 11/03/15 0536   11/02/15 1400  metroNIDAZOLE (FLAGYL) tablet 500 mg  Status:  Discontinued     500 mg Oral Every 8 hours 11/02/15 0936 11/03/15 0536   10/31/15 0800  ciprofloxacin (CIPRO) IVPB 400 mg  Status:  Discontinued     400 mg 200 mL/hr over 60 Minutes Intravenous Every 12 hours 11/08/2015 2308 11/02/15 0936   10/31/15 0300  metroNIDAZOLE (FLAGYL) IVPB 500 mg  Status:  Discontinued     500 mg 100 mL/hr over 60 Minutes Intravenous Every 8 hours 11/06/2015 2239 11/02/15 0936    10/24/2015 1915  ciprofloxacin (CIPRO) IVPB 400 mg     400 mg 200 mL/hr over 60 Minutes Intravenous  Once 10/18/2015 1909 10/27/2015 2054   11/10/2015 1915  metroNIDAZOLE (FLAGYL) IVPB 500 mg     500 mg 100 mL/hr over 60 Minutes Intravenous  Once 11/14/2015 1909 11/15/2015 2047       HPI/Subjective:  Fair Occasional loose stool No cp Overall improved No n/v   Objective: Vitals:   11/13/15 0822 11/13/15 0921  BP: (!) 101/58 104/69  Pulse: 92 94  Resp: (!) 26   Temp: 99 F (37.2 C)     Intake/Output Summary (Last 24 hours) at 11/13/15 1007 Last data filed at 11/13/15 0630  Gross per 24 hour  Intake               13 ml  Output              525 ml  Net             -512 ml   Filed Weights   11/08/15 0354 11/09/15 0500 11/12/15 0345  Weight: 87.5 kg (192 lb 14.4 oz) 89.6 kg (197 lb 8 oz) 83.9 kg (185 lb)    Exam:   General:  In no distress, denies CP. Feels weak overall, But slightly improved  Cardiovascular: S1 and S2, no rubs, no gallops, no JVD  Respiratory: no wheezing, improved air movement bilaterallly  Abdomen: positive BS, diffuse mil tenderness with palpation, no guarding, no distension   Musculoskeletal: trace edema bilaterally, no cyanosis, no clubbing   Data Reviewed: Basic Metabolic Panel:  Recent Labs Lab 11/08/15 0346 11/09/15 0420 11/10/15 0320 11/11/15 0500 11/12/15 0408 11/13/15 0841  NA 131* 128* 124* 126* 122* 124*  K 4.2 4.7 4.7 5.2* 5.2* 5.1  CL 97* 95* 92* 93* 90* 92*  CO2 24 21* 22 21* 20* 19*  GLUCOSE 114* 133* 218* 170* 151* 89  BUN '18 19 19 '$ 22* 27* 29*  CREATININE 1.58* 1.51* 1.47* 1.62* 1.73* 1.80*  CALCIUM 7.6* 7.7* 7.7* 8.2* 8.0* 8.3*  MG 2.0 1.9 1.8 1.9  --   --   PHOS 3.1 3.1 3.7 4.1 4.8*  --    Liver Function Tests:  Recent Labs Lab 11/12/15 0408  ALBUMIN 2.3*   CBC:  Recent Labs Lab 11/08/15 0346 11/09/15 0420 11/10/15 0320 11/11/15 0500 11/12/15 0408  WBC 6.3 6.2 6.1 7.3 7.6  NEUTROABS 6.1 6.0 6.0 7.1 7.5   HGB 8.5* 8.7* 8.6* 8.5* 8.1*  HCT 25.4* 25.6* 25.2* 25.1* 23.8*  MCV 85.2 84.2 83.2 83.7 82.6  PLT 179 221 231 287 295   BNP (last 3 results)  Recent Labs  10/31/15 1516 11/03/15  1430  BNP 299.7* 143.2*   CBG:  Recent Labs Lab 11/12/15 1233 11/12/15 1638 11/12/15 1803 11/12/15 2228 11/13/15 0820  GLUCAP 157* 155* 126* 150* 108*    Recent Results (from the past 240 hour(s))  Culture, blood (routine x 2)     Status: None   Collection Time: 11/05/15  9:50 AM  Result Value Ref Range Status   Specimen Description BLOOD LEFT HAND  Final   Special Requests BOTTLES DRAWN AEROBIC ONLY  5CC  Final   Culture NO GROWTH 5 DAYS  Final   Report Status 11/10/2015 FINAL  Final  Culture, blood (routine x 2)     Status: None   Collection Time: 11/05/15  9:58 AM  Result Value Ref Range Status   Specimen Description BLOOD LEFT WRIST  Final   Special Requests BOTTLES DRAWN AEROBIC ONLY  5CC  Final   Culture NO GROWTH 5 DAYS  Final   Report Status 11/10/2015 FINAL  Final  C difficile quick scan w PCR reflex     Status: None   Collection Time: 11/05/15 11:05 AM  Result Value Ref Range Status   C Diff antigen NEGATIVE NEGATIVE Final   C Diff toxin NEGATIVE NEGATIVE Final   C Diff interpretation No C. difficile detected.  Final  Respiratory Panel by PCR     Status: None   Collection Time: 11/07/15 11:47 AM  Result Value Ref Range Status   Adenovirus NOT DETECTED NOT DETECTED Final   Coronavirus 229E NOT DETECTED NOT DETECTED Final   Coronavirus HKU1 NOT DETECTED NOT DETECTED Final   Coronavirus NL63 NOT DETECTED NOT DETECTED Final   Coronavirus OC43 NOT DETECTED NOT DETECTED Final   Metapneumovirus NOT DETECTED NOT DETECTED Final   Rhinovirus / Enterovirus NOT DETECTED NOT DETECTED Final   Influenza A NOT DETECTED NOT DETECTED Final   Influenza B NOT DETECTED NOT DETECTED Final   Parainfluenza Virus 1 NOT DETECTED NOT DETECTED Final   Parainfluenza Virus 2 NOT DETECTED NOT DETECTED  Final   Parainfluenza Virus 3 NOT DETECTED NOT DETECTED Final   Parainfluenza Virus 4 NOT DETECTED NOT DETECTED Final   Respiratory Syncytial Virus NOT DETECTED NOT DETECTED Final   Bordetella pertussis NOT DETECTED NOT DETECTED Final   Chlamydophila pneumoniae NOT DETECTED NOT DETECTED Final   Mycoplasma pneumoniae NOT DETECTED NOT DETECTED Final     Studies: No results found.  Scheduled Meds: . azithromycin  1,200 mg Oral Weekly  . dolutegravir  50 mg Oral Daily  . emtricitabine-tenofovir AF  1 tablet Oral Daily  . enoxaparin (LOVENOX) injection  40 mg Subcutaneous Q24H  . furosemide  40 mg Oral Daily  . insulin aspart  0-5 Units Subcutaneous QHS  . insulin aspart  0-9 Units Subcutaneous TID WC  . ipratropium-albuterol  3 mL Nebulization TID  . metoprolol tartrate  50 mg Oral BID  . pantoprazole  40 mg Oral Daily  . predniSONE  60 mg Oral QAC breakfast  . saccharomyces boulardii  250 mg Oral BID  . sodium chloride flush  10-40 mL Intracatheter Q12H  . sodium chloride flush  3 mL Intravenous Q12H  . sodium chloride  1 g Oral BID WC  . 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

## 2015-11-13 NOTE — Progress Notes (Signed)
PT Note Full eval note to follow.  Pt weak and deconditioned and will need SNF for therapy.  Pt agrees.  Thanks.  [place] [phone] (pager)

## 2015-11-14 ENCOUNTER — Inpatient Hospital Stay (HOSPITAL_COMMUNITY): Payer: BC Managed Care – PPO

## 2015-11-14 DIAGNOSIS — N289 Disorder of kidney and ureter, unspecified: Secondary | ICD-10-CM

## 2015-11-14 LAB — CBC WITH DIFFERENTIAL/PLATELET
BASOS PCT: 0 %
Basophils Absolute: 0 10*3/uL (ref 0.0–0.1)
EOS ABS: 0 10*3/uL (ref 0.0–0.7)
Eosinophils Relative: 0 %
HCT: 25.2 % — ABNORMAL LOW (ref 36.0–46.0)
Hemoglobin: 8.5 g/dL — ABNORMAL LOW (ref 12.0–15.0)
LYMPHS PCT: 1 %
Lymphs Abs: 0.1 10*3/uL — ABNORMAL LOW (ref 0.7–4.0)
MCH: 28.5 pg (ref 26.0–34.0)
MCHC: 33.7 g/dL (ref 30.0–36.0)
MCV: 84.6 fL (ref 78.0–100.0)
MONO ABS: 0.1 10*3/uL (ref 0.1–1.0)
Monocytes Relative: 1 %
NEUTROS PCT: 98 %
Neutro Abs: 10.3 10*3/uL — ABNORMAL HIGH (ref 1.7–7.7)
PLATELETS: 233 10*3/uL (ref 150–400)
RBC: 2.98 MIL/uL — ABNORMAL LOW (ref 3.87–5.11)
RDW: 16.1 % — ABNORMAL HIGH (ref 11.5–15.5)
WBC: 10.5 10*3/uL (ref 4.0–10.5)

## 2015-11-14 LAB — BASIC METABOLIC PANEL
ANION GAP: 8 (ref 5–15)
BUN: 27 mg/dL — ABNORMAL HIGH (ref 6–20)
CALCIUM: 8.2 mg/dL — AB (ref 8.9–10.3)
CO2: 22 mmol/L (ref 22–32)
CREATININE: 1.74 mg/dL — AB (ref 0.44–1.00)
Chloride: 96 mmol/L — ABNORMAL LOW (ref 101–111)
GFR, EST AFRICAN AMERICAN: 37 mL/min — AB (ref >60)
GFR, EST NON AFRICAN AMERICAN: 32 mL/min — AB (ref >60)
Glucose, Bld: 81 mg/dL (ref 65–99)
Potassium: 4.9 mmol/L (ref 3.5–5.1)
SODIUM: 126 mmol/L — AB (ref 135–145)

## 2015-11-14 LAB — GLUCOSE, CAPILLARY
GLUCOSE-CAPILLARY: 81 mg/dL (ref 65–99)
GLUCOSE-CAPILLARY: 110 mg/dL — AB (ref 65–99)
Glucose-Capillary: 193 mg/dL — ABNORMAL HIGH (ref 65–99)
Glucose-Capillary: 415 mg/dL — ABNORMAL HIGH (ref 65–99)
Glucose-Capillary: 230 mg/dL — ABNORMAL HIGH (ref 65–99)

## 2015-11-14 MED ORDER — METOPROLOL TARTRATE 12.5 MG HALF TABLET
12.5000 mg | ORAL_TABLET | Freq: Two times a day (BID) | ORAL | Status: DC
  Administered 2015-11-14 – 2015-11-26 (×21): 12.5 mg via ORAL
  Filled 2015-11-14 (×29): qty 1

## 2015-11-14 MED ORDER — PREDNISONE 20 MG PO TABS
40.0000 mg | ORAL_TABLET | Freq: Every day | ORAL | Status: DC
  Filled 2015-11-14: qty 2

## 2015-11-14 MED ORDER — ACETAMINOPHEN 325 MG PO TABS
650.0000 mg | ORAL_TABLET | Freq: Four times a day (QID) | ORAL | Status: DC | PRN
  Administered 2015-11-14 – 2015-11-26 (×5): 650 mg via ORAL
  Filled 2015-11-14 (×5): qty 2

## 2015-11-14 NOTE — Progress Notes (Signed)
[facility] PROGRESS NOTE  Nancy Acevedo TOI:[id number] DOB: December 13, 1960 DOA: 10/23/2015 PCP: Binnie Rail, MD  Interim summary and HPI  48 ? Hypertension,  chronic kidney disease stage II,  OSA,  GERD, and  recent diagnosis of Crohn's colitis managed with prednisone  presented to the [facility] 11/17/2015  2-3 days of fevers, nausea, vomiting, and nonbloody diarrhea. insidious development of abdominal pain approximately 6 months ago and underwent a colonoscopy in July of this year with a large ulcer at the terminal ileum and several punctate deep ulcers in the right colon, with pathology consistent with Crohn's Admit with abdominal pain as severe, cramping in character, localized to the lower abdomen, and associated with loss of appetite.  On 10/20 her resp status decompensated and patient was moved to ICU. Work up demonstrated bilateral infiltrates and ground glass opacity. 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  bactrim IV and steroids, ID is on board.  Patient resp status stabilizes with the use of BIPAP On 10/23 was move to stepdown and back to [facility] service.   Assessment/Plan:  Sepsis: due to AIDS/PCP PNA ID on board, will follow rec's -patient CD4 count < 10 -treating her with IV bactrim and steroids-half transitioned both to oral formulations on 10/25 -also started on Tivicay and Descovy -HLA [id number] and viral load reflex still pending -TOxo and Crytpo neg -3 weeks Rx for PCP to stop on 11/26/15 and would continue steroids up until then -Repeat blood cultures w/o growth  -will need Assitance with meds s well as referrals to [facility] for further HIV care -would benefit from SNF placement as significantly debilitated   Acute resp failure with hypoxia: due to PCP PNA, ARDS and ALI. 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 -C diff negative  -Will follow GI rec's (at this point, focus on tx for HIV and PCP; outpatient follow up with her primary gastroenterologist (Dr. Ardis Hughs) and at that time repeat colonoscopy once HIV is well controlled to reevaluate diagnosis of IBD vs GI inflammation from HIV). -CMV was ordered  -Diarrhea in fact seems better according to patient  Chest pain/Elevated troponin -demand ischemia in setting of sepsis -with recent work up during this year, 02/11/15 cardiac cath--normal coronaries -no signs of acute ischemia on EKG -Echocardiogram--EF 55-65%, no WMA, trivial MR/TR  Indeterminant QuanteriFERON PPD was placed so far negative  -checking for latent TB for remicade -NO clinical suspicion for active pulm TB at this moment -per ID, consider repeat TB testing once immune system is reconstituted   Acute on chronic kidney disease: stage 2 at baseline -Baseline creatinine 1-1.2 -currently 1.8-->1.7  Hypertension -metoprolol tartrate 50 twice a day-note soft BP 10/28.  Cut back dose to 12.5 bid -pt endorsed poor compliance with meds at home missing 2-3 days per week approx. -is dizzy when she stands  -d/c Amlodipine 10 on 10/27 given conitnued symptoms  Diabetes mellitus  type 2 - stable -10/02/2015 hemoglobin A1c 6.6 -Continue NovoLog sliding scale  -anticipate CBG's elevated due to  steroids  GERD -10/22/14--EGD--The esophageal mucosa was abnormal appearing throughout but mainly in the proximal 1/2 of the esophagus. -will continue PPI  Thrombocytopenia on admission 111 -likely due to sepsis -currently 233 -will monitor trend -no signs of bleeding appreciated  Diarrhea: -due to cron's  -will add florastor -per GI, ok to use loperamide or lomotil if needed  -neg C. Diff  Probable hypervolemic Hyponatremia and a setting of chronic diarrhea, hypokalemia -due to ongoing diarrhea, and for her hyponatremia PCP PNA also contributing--diarrhea is somewhat better however -will replete electrolytes and follow trend. -prefer to not use lasix as volume depletion can occur and not really eating or drinking much -Replaced orally with salt tablets and liberalize diet to regular -Lasix changed to by mouth formulation 11/11/2015 -Potassium discontinued as mildly hyperkalemic 10/25 -Repeat labs in a.m.  Anemia -  -AOCD  -transfused 2 units PRBCs 11/01/15 -Hgb stable -no signs of bleeding appreciated  OSA -currently on BIPAP QHS and tolerating this well  Code Status: Full Family Communication: no family at bedside  Disposition Plan: Continue treatment as above  Consultants:  GI (sign off)  PCCM (sign off)  ID  Procedures:  See below for x-ray reports   Antibiotics: Anti-infectives    Start     Dose/Rate Route Frequency Ordered Stop   11/13/15 0000  dolutegravir (TIVICAY) 50 MG tablet     50 mg Oral Daily 11/12/15 1622     11/13/15 0000  emtricitabine-tenofovir AF (DESCOVY) 200-25 MG tablet     1 tablet Oral Daily 11/12/15 1622     11/12/15 1000  sulfamethoxazole-trimethoprim (BACTRIM DS,SEPTRA DS) 800-160 MG per tablet 1 tablet  Status:  Discontinued     1 tablet Oral Every 12 hours 11/12/15 0729 11/12/15 0829   11/12/15 1000  sulfamethoxazole-trimethoprim (BACTRIM DS,SEPTRA DS) 800-160 MG per tablet 2 tablet     2 tablet Oral 3 times daily 11/12/15  0829 11/27/15 0959   11/12/15 1000  azithromycin (ZITHROMAX) tablet 1,200 mg     1,200 mg Oral Weekly 11/12/15 0927     11/07/15 1700  dolutegravir (TIVICAY) tablet 50 mg     50 mg Oral Daily 11/07/15 1556     11/07/15 1700  emtricitabine-tenofovir AF (DESCOVY) 200-25 MG per tablet 1 tablet     1 tablet Oral Daily 11/07/15 1556     11/06/15 1400  sulfamethoxazole-trimethoprim (BACTRIM) 320 mg of trimethoprim in dextrose 5 % 500 mL IVPB  Status:  Discontinued     320 mg of trimethoprim 346.7 mL/hr over 90 Minutes Intravenous Every 6 hours 11/06/15 1305 11/06/15 1310   11/06/15 1400  sulfamethoxazole-trimethoprim (BACTRIM) 320 mg of trimethoprim in dextrose 5 % 500 mL IVPB  Status:  Discontinued     320 mg of trimethoprim 346.7 mL/hr over 90 Minutes Intravenous Every 8 hours 11/06/15 1310 11/06/15 1312   11/06/15 1400  sulfamethoxazole-trimethoprim (BACTRIM) 320 mg of trimethoprim in dextrose 5 % 500 mL IVPB  Status:  Discontinued     320 mg of trimethoprim 346.7 mL/hr over 90 Minutes Intravenous Every 6 hours 11/06/15 1312 11/12/15 0729   11/05/15 1530  vancomycin (VANCOCIN) IVPB 1000 mg/200 mL premix     1,000 mg 200 mL/hr over 60 Minutes Intravenous  Once 11/05/15 1456 11/05/15 1710   11/03/15 0600  vancomycin (VANCOCIN) IVPB 750 mg/150 ml premix  Status:  Discontinued     750  mg 150 mL/hr over 60 Minutes Intravenous Every 12 hours 11/03/15 0550 11/03/15 1350   11/03/15 0600  piperacillin-tazobactam (ZOSYN) IVPB 3.375 g  Status:  Discontinued     3.375 g 12.5 mL/hr over 240 Minutes Intravenous Every 8 hours 11/03/15 0550 11/08/15 1020   11/02/15 2000  ciprofloxacin (CIPRO) tablet 500 mg  Status:  Discontinued     500 mg Oral 2 times daily 11/02/15 0936 11/03/15 0536   11/02/15 1400  metroNIDAZOLE (FLAGYL) tablet 500 mg  Status:  Discontinued     500 mg Oral Every 8 hours 11/02/15 0936 11/03/15 0536   10/31/15 0800  ciprofloxacin (CIPRO) IVPB 400 mg  Status:  Discontinued     400  mg 200 mL/hr over 60 Minutes Intravenous Every 12 hours 11/15/2015 2308 11/02/15 0936   10/31/15 0300  metroNIDAZOLE (FLAGYL) IVPB 500 mg  Status:  Discontinued     500 mg 100 mL/hr over 60 Minutes Intravenous Every 8 hours 11/02/2015 2239 11/02/15 0936   11/05/2015 1915  ciprofloxacin (CIPRO) IVPB 400 mg     400 mg 200 mL/hr over 60 Minutes Intravenous  Once 11/12/2015 1909 10/23/2015 2054   10/23/2015 1915  metroNIDAZOLE (FLAGYL) IVPB 500 mg     500 mg 100 mL/hr over 60 Minutes Intravenous  Once 11/08/2015 1909 10/20/2015 2047       HPI/Subjective:  Fair Occasional loose stool No cp Overall improved No n/v   Objective: Vitals:   11/14/15 0600 11/14/15 0800  BP: (!) 122/95 122/76  Pulse: 98 100  Resp: (!) 32 (!) 36  Temp:  (!) 101 F (38.3 C)    Intake/Output Summary (Last 24 hours) at 11/14/15 1508 Last data filed at 11/14/15 0917  Gross per 24 hour  Intake              133 ml  Output              450 ml  Net             -317 ml   Filed Weights   11/08/15 0354 11/09/15 0500 11/12/15 0345  Weight: 87.5 kg (192 lb 14.4 oz) 89.6 kg (197 lb 8 oz) 83.9 kg (185 lb)    Exam:   General:  In no distress, denies CP. Feels weak overall, But slightly improved  Cardiovascular: S1 and S2, no rubs, no gallops, no JVD  Respiratory: no wheezing, improved air movement bilaterallly  Abdomen: positive BS, diffuse mil tenderness with palpation, no guarding, no distension   Musculoskeletal: trace edema bilaterally, no cyanosis, no clubbing   Data Reviewed: Basic Metabolic Panel:  Recent Labs Lab 11/08/15 0346 11/09/15 0420 11/10/15 0320 11/11/15 0500 11/12/15 0408 11/13/15 0841 11/14/15 0405  NA 131* 128* 124* 126* 122* 124* 126*  K 4.2 4.7 4.7 5.2* 5.2* 5.1 4.9  CL 97* 95* 92* 93* 90* 92* 96*  CO2 24 21* 22 21* 20* 19* 22  GLUCOSE 114* 133* 218* 170* 151* 89 81  BUN '18 19 19 '$ 22* 27* 29* 27*  CREATININE 1.58* 1.51* 1.47* 1.62* 1.73* 1.80* 1.74*  CALCIUM 7.6* 7.7* 7.7* 8.2*  8.0* 8.3* 8.2*  MG 2.0 1.9 1.8 1.9  --   --   --   PHOS 3.1 3.1 3.7 4.1 4.8*  --   --    Liver Function Tests:  Recent Labs Lab 11/12/15 0408  ALBUMIN 2.3*   CBC:  Recent Labs Lab 11/09/15 0420 11/10/15 0320 11/11/15 0500 11/12/15 0408 11/14/15 1000  WBC 6.2  6.1 7.3 7.6 10.5  NEUTROABS 6.0 6.0 7.1 7.5 10.3*  HGB 8.7* 8.6* 8.5* 8.1* 8.5*  HCT 25.6* 25.2* 25.1* 23.8* 25.2*  MCV 84.2 83.2 83.7 82.6 84.6  PLT 221 231 287 295 233   BNP (last 3 results)  Recent Labs  10/31/15 1516 11/03/15 1430  BNP 299.7* 143.2*   CBG:  Recent Labs Lab 11/13/15 1652 11/13/15 2131 11/14/15 0837 11/14/15 1215 11/14/15 1227  GLUCAP 151* 106* 81 415* 193*    Recent Results (from the past 240 hour(s))  Culture, blood (routine x 2)     Status: None   Collection Time: 11/05/15  9:50 AM  Result Value Ref Range Status   Specimen Description BLOOD LEFT HAND  Final   Special Requests BOTTLES DRAWN AEROBIC ONLY  5CC  Final   Culture NO GROWTH 5 DAYS  Final   Report Status 11/10/2015 FINAL  Final  Culture, blood (routine x 2)     Status: None   Collection Time: 11/05/15  9:58 AM  Result Value Ref Range Status   Specimen Description BLOOD LEFT WRIST  Final   Special Requests BOTTLES DRAWN AEROBIC ONLY  5CC  Final   Culture NO GROWTH 5 DAYS  Final   Report Status 11/10/2015 FINAL  Final  C difficile quick scan w PCR reflex     Status: None   Collection Time: 11/05/15 11:05 AM  Result Value Ref Range Status   C Diff antigen NEGATIVE NEGATIVE Final   C Diff toxin NEGATIVE NEGATIVE Final   C Diff interpretation No C. difficile detected.  Final  Respiratory Panel by PCR     Status: None   Collection Time: 11/07/15 11:47 AM  Result Value Ref Range Status   Adenovirus NOT DETECTED NOT DETECTED Final   Coronavirus 229E NOT DETECTED NOT DETECTED Final   Coronavirus HKU1 NOT DETECTED NOT DETECTED Final   Coronavirus NL63 NOT DETECTED NOT DETECTED Final   Coronavirus OC43 NOT DETECTED NOT  DETECTED Final   Metapneumovirus NOT DETECTED NOT DETECTED Final   Rhinovirus / Enterovirus NOT DETECTED NOT DETECTED Final   Influenza A NOT DETECTED NOT DETECTED Final   Influenza B NOT DETECTED NOT DETECTED Final   Parainfluenza Virus 1 NOT DETECTED NOT DETECTED Final   Parainfluenza Virus 2 NOT DETECTED NOT DETECTED Final   Parainfluenza Virus 3 NOT DETECTED NOT DETECTED Final   Parainfluenza Virus 4 NOT DETECTED NOT DETECTED Final   Respiratory Syncytial Virus NOT DETECTED NOT DETECTED Final   Bordetella pertussis NOT DETECTED NOT DETECTED Final   Chlamydophila pneumoniae NOT DETECTED NOT DETECTED Final   Mycoplasma pneumoniae NOT DETECTED NOT DETECTED Final     Studies: Dg Chest 2 View  Result Date: 11/14/2015 CLINICAL DATA:  Pneumonia.  Shortness of breath.  Fever. EXAM: CHEST  2 VIEW COMPARISON:  11/11/2015 chest radiograph. FINDINGS: Right PICC terminates at the cavoatrial junction. Stable cardiomediastinal silhouette with normal heart size. No pneumothorax. No pleural effusion. Extensive patchy opacities throughout both lungs have not appreciably changed. IMPRESSION: Extensive patchy opacities throughout both lungs have not appreciably changed, most suggestive of multilobar pneumonia. Follow-up post treatment chest imaging to document resolution of these opacities is necessary to exclude other etiologies. Electronically Signed   By: Ilona Sorrel M.D.   On: 11/14/2015 11:15    Scheduled Meds: . azithromycin  1,200 mg Oral Weekly  . diphenoxylate-atropine  5 mL Oral QID  . dolutegravir  50 mg Oral Daily  . emtricitabine-tenofovir AF  1 tablet  Oral Daily  . enoxaparin (LOVENOX) injection  40 mg Subcutaneous Q24H  . furosemide  40 mg Oral Daily  . insulin aspart  0-5 Units Subcutaneous QHS  . insulin aspart  0-9 Units Subcutaneous TID WC  . ipratropium-albuterol  3 mL Nebulization TID  . metoprolol tartrate  12.5 mg Oral BID  . pantoprazole  40 mg Oral Daily  . predniSONE  60  mg Oral QAC breakfast  . saccharomyces boulardii  250 mg Oral BID  . sodium chloride flush  10-40 mL Intracatheter Q12H  . sodium chloride flush  3 mL Intravenous Q12H  . sodium chloride  1 g Oral BID WC  . 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

## 2015-11-14 NOTE — Progress Notes (Signed)
[facility] for [facility]   Reason for visit: Follow up on PCP pneumonia  Interval History: continues to feel sob, on O2, sob with activity; Tmax 101 this am; eating some  CXR independently reviewed and quite extensive bilateral opacities  Physical Exam: Constitutional:  Vitals:   11/14/15 0600 11/14/15 0800  BP: (!) 122/95 122/76  Pulse: 98 100  Resp: (!) 32 (!) 36  Temp:  (!) 101 F (38.3 C)   patient appears in mild respiratory distress Eyes: anicteric HENT: no thrush Respiratory: mild increased respiratory effort but speaks in full sentences; diffusely rhoncherus Cardiovascular: RRR GI: soft, nt, nd  Review of Systems: Constitutional: negative for chills Gastrointestinal: negative for diarrhea Hematologic/lymphatic: negative for lymphadenopathy  Lab Results  Component Value Date   WBC 10.5 11/14/2015   HGB 8.5 (L) 11/14/2015   HCT 25.2 (L) 11/14/2015   MCV 84.6 11/14/2015   PLT 233 11/14/2015    Lab Results  Component Value Date   CREATININE 1.74 (H) 11/14/2015   BUN 27 (H) 11/14/2015   NA 126 (L) 11/14/2015   K 4.9 11/14/2015   CL 96 (L) 11/14/2015   CO2 22 11/14/2015    Lab Results  Component Value Date   ALT 15 11/05/2015   AST 22 11/05/2015   ALKPHOS 89 11/05/2015     Microbiology: Recent Results (from the past 240 hour(s))  Culture, blood (routine x 2)     Status: None   Collection Time: 11/05/15  9:50 AM  Result Value Ref Range Status   Specimen Description BLOOD LEFT HAND  Final   Special Requests BOTTLES DRAWN AEROBIC ONLY  5CC  Final   Culture NO GROWTH 5 DAYS  Final   Report Status 11/10/2015 FINAL  Final  Culture, blood (routine x 2)     Status: None   Collection Time: 11/05/15  9:58 AM  Result Value Ref Range Status   Specimen Description BLOOD LEFT WRIST  Final   Special Requests BOTTLES DRAWN AEROBIC ONLY  5CC  Final   Culture NO GROWTH 5 DAYS  Final   Report Status 11/10/2015 FINAL  Final  C difficile quick scan  w PCR reflex     Status: None   Collection Time: 11/05/15 11:05 AM  Result Value Ref Range Status   C Diff antigen NEGATIVE NEGATIVE Final   C Diff toxin NEGATIVE NEGATIVE Final   C Diff interpretation No C. difficile detected.  Final  Respiratory Panel by PCR     Status: None   Collection Time: 11/07/15 11:47 AM  Result Value Ref Range Status   Adenovirus NOT DETECTED NOT DETECTED Final   Coronavirus 229E NOT DETECTED NOT DETECTED Final   Coronavirus HKU1 NOT DETECTED NOT DETECTED Final   Coronavirus NL63 NOT DETECTED NOT DETECTED Final   Coronavirus OC43 NOT DETECTED NOT DETECTED Final   Metapneumovirus NOT DETECTED NOT DETECTED Final   Rhinovirus / Enterovirus NOT DETECTED NOT DETECTED Final   Influenza A NOT DETECTED NOT DETECTED Final   Influenza B NOT DETECTED NOT DETECTED Final   Parainfluenza Virus 1 NOT DETECTED NOT DETECTED Final   Parainfluenza Virus 2 NOT DETECTED NOT DETECTED Final   Parainfluenza Virus 3 NOT DETECTED NOT DETECTED Final   Parainfluenza Virus 4 NOT DETECTED NOT DETECTED Final   Respiratory Syncytial Virus NOT DETECTED NOT DETECTED Final   Bordetella pertussis NOT DETECTED NOT DETECTED Final   Chlamydophila pneumoniae NOT DETECTED NOT DETECTED Final   Mycoplasma pneumoniae NOT DETECTED  NOT DETECTED Final    Impression/Plan:  1. Presumed PCP pneumonia - still SOB and CXR without much change. Day 8 of Bactrim/steroids.   2. 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  Can get copay cards at [facility] when discharged.   3. Renal insufficiency - creat has been high but stable on Bactrim.

## 2015-11-15 ENCOUNTER — Inpatient Hospital Stay (HOSPITAL_COMMUNITY): Payer: BC Managed Care – PPO

## 2015-11-15 DIAGNOSIS — J810 Acute pulmonary edema: Secondary | ICD-10-CM

## 2015-11-15 DIAGNOSIS — R0603 Acute respiratory distress: Secondary | ICD-10-CM

## 2015-11-15 DIAGNOSIS — E875 Hyperkalemia: Secondary | ICD-10-CM

## 2015-11-15 LAB — BASIC METABOLIC PANEL
ANION GAP: 12 (ref 5–15)
BUN: 36 mg/dL — ABNORMAL HIGH (ref 6–20)
CALCIUM: 8.3 mg/dL — AB (ref 8.9–10.3)
CO2: 18 mmol/L — AB (ref 22–32)
CREATININE: 2.08 mg/dL — AB (ref 0.44–1.00)
Chloride: 96 mmol/L — ABNORMAL LOW (ref 101–111)
GFR, EST AFRICAN AMERICAN: 30 mL/min — AB (ref >60)
GFR, EST NON AFRICAN AMERICAN: 26 mL/min — AB (ref >60)
Glucose, Bld: 81 mg/dL (ref 65–99)
Potassium: 5.5 mmol/L — ABNORMAL HIGH (ref 3.5–5.1)
SODIUM: 126 mmol/L — AB (ref 135–145)

## 2015-11-15 LAB — GLUCOSE, CAPILLARY
GLUCOSE-CAPILLARY: 111 mg/dL — AB (ref 65–99)
GLUCOSE-CAPILLARY: 191 mg/dL — AB (ref 65–99)
Glucose-Capillary: 115 mg/dL — ABNORMAL HIGH (ref 65–99)
Glucose-Capillary: 203 mg/dL — ABNORMAL HIGH (ref 65–99)

## 2015-11-15 LAB — BLOOD GAS, ARTERIAL
ACID-BASE DEFICIT: 6 mmol/L — AB (ref 0.0–2.0)
BICARBONATE: 17.6 mmol/L — AB (ref 20.0–28.0)
DELIVERY SYSTEMS: POSITIVE
Drawn by: 270211
EXPIRATORY PAP: 6
FIO2: 0.5
INSPIRATORY PAP: 12
MODE: POSITIVE
O2 Saturation: 84.1 %
PH ART: 7.423 (ref 7.350–7.450)
Patient temperature: 99.3
pCO2 arterial: 27.5 mmHg — ABNORMAL LOW (ref 32.0–48.0)
pO2, Arterial: 56.4 mmHg — ABNORMAL LOW (ref 83.0–108.0)

## 2015-11-15 MED ORDER — SULFAMETHOXAZOLE-TRIMETHOPRIM 800-160 MG PO TABS
2.0000 | ORAL_TABLET | Freq: Three times a day (TID) | ORAL | Status: DC
  Administered 2015-11-15 (×2): 2 via ORAL
  Filled 2015-11-15 (×4): qty 2

## 2015-11-15 MED ORDER — FUROSEMIDE 10 MG/ML IJ SOLN
40.0000 mg | Freq: Two times a day (BID) | INTRAMUSCULAR | Status: DC
  Administered 2015-11-15 – 2015-11-19 (×8): 40 mg via INTRAVENOUS
  Filled 2015-11-15 (×8): qty 4

## 2015-11-15 MED ORDER — SODIUM CHLORIDE 0.9 % IV SOLN
3.0000 g | Freq: Four times a day (QID) | INTRAVENOUS | Status: DC
  Administered 2015-11-15 – 2015-11-16 (×4): 3 g via INTRAVENOUS
  Filled 2015-11-15 (×6): qty 3

## 2015-11-15 MED ORDER — METHYLPREDNISOLONE SODIUM SUCC 125 MG IJ SOLR
60.0000 mg | Freq: Two times a day (BID) | INTRAMUSCULAR | Status: DC
  Administered 2015-11-15 – 2015-11-22 (×16): 60 mg via INTRAVENOUS
  Filled 2015-11-15 (×18): qty 2

## 2015-11-15 MED ORDER — DOCUSATE SODIUM 100 MG PO CAPS
200.0000 mg | ORAL_CAPSULE | Freq: Two times a day (BID) | ORAL | Status: DC
  Administered 2015-11-15 – 2015-11-18 (×3): 200 mg via ORAL
  Filled 2015-11-15 (×6): qty 2

## 2015-11-15 MED ORDER — ACETAMINOPHEN 10 MG/ML IV SOLN
500.0000 mg | Freq: Four times a day (QID) | INTRAVENOUS | Status: AC
  Administered 2015-11-15 – 2015-11-16 (×4): 500 mg via INTRAVENOUS
  Filled 2015-11-15 (×6): qty 50

## 2015-11-15 NOTE — Progress Notes (Signed)
Pharmacy Antibiotic Note Nancy Acevedo is a 55 y.o. female admitted on 11/15/2015.    Continues on day #10/21 of Bactrim for presumed PCP PNA. Switched to PO Bactrim on 10/26. ID recommends lower dose of PO Bactrim after 3 weeks of treatment. Tivicay/Descovy started for new HIV dx. CD4 <10,HLA neg,  HIV RNA is in process. Started Azithromycin for OI prophylaxis as well. Afebrile, WBC wnl.  SCr trending down to 1.74, CrCl ~ 41m/min.  BMP not obtained yet today.   Plan: 1. Continue Bactrim 2 DS tablets TID for the remainder of 3 weeks, then transition to 1 DS tablet daily 2. Monitor renal function closely (may need to dose adjust Bactrim if CrCl drops below 374mmin), electrolytes 3. Bactrim may also be contributing to rise in SCr   Height: _0  (162.6 cm) Weight: 174 lb 11.2 oz (79.2 kg) IBW/kg (Calculated) : 54.7  Temp (24hrs), Avg:99.2 F (37.3 C), Min:98.7 F (37.1 C), Max:99.5 F (37.5 C)   Recent Labs Lab 11/09/15 0420 11/10/15 0320 11/11/15 0500 11/12/15 0408 11/13/15 0841 11/14/15 0405 11/14/15 1000  WBC 6.2 6.1 7.3 7.6  --   --  10.5  CREATININE 1.51* 1.47* 1.62* 1.73* 1.80* 1.74*  --     Estimated Creatinine Clearance: 38.1 mL/min (by C-G formula based on SCr of 1.74 mg/dL (H)).    Allergies  Allergen Reactions  . Lexapro [Escitalopram] Itching  . Losartan   . Spironolactone     rash  . Clonidine Derivatives Palpitations   Antimicrobials this admission:  Vanco 10/17 x1 (750 mg); 10/19 x 1 (1g) Zosyn 10/17 >>10/22 Cipro 10/13 >> 10/16 Flagyl 10/13 >> 10/16  Bactrim 10/20 >> Azithromycin 10/26 weekly >>  Microbiology results:  10/14 Cdiff >> negative 10/16 Quantiferon Gold >> indeterminate so PPD placed 10/17 - read as negative 10/17 BCx: neg 10/17 MRSA PCR: neg 10/19 BCx2: neg 10/19 Cdif: neg x2 10/20 HIV: pos 10/21 HIV RNA: ip 10/21 CD4: <10 10/21 HLA: neg 10/21 Resp PCR: neg  Thank you for allowing pharmacy to be a part of this patient's  care.  EmUvaldo BristlePharmD PGY1 Pharmacy Resident  11/15/2015, 9:01 AM Pager: [phone]

## 2015-11-15 NOTE — Progress Notes (Addendum)
    [facility] for Infectious Disease   Reason for visit: Follow up on PCP pneumonia  Interval History: continues to feel sob, on O2, sob with activity; Tmax 101 again  CXR independently reviewed and quite extensive bilateral opacities remain  Physical Exam: Constitutional:  Vitals:   11/15/15 0728 11/15/15 1146  BP: 123/90 131/88  Pulse: (!) 111 (!) 120  Resp: (!) 38 (!) 27  Temp: 99.1 F (37.3 C) 99.8 F (37.7 C)   patient appears in mild respiratory distress on bipap now Eyes: anicteric HENT: no thrush Respiratory: currently appears breathing comfortably on bipap Cardiovascular: RRR GI: soft, nt, nd  Review of Systems: Constitutional: negative for chills Gastrointestinal: negative for diarrhea Hematologic/lymphatic: negative for lymphadenopathy  Lab Results  Component Value Date   WBC 10.5 11/14/2015   HGB 8.5 (L) 11/14/2015   HCT 25.2 (L) 11/14/2015   MCV 84.6 11/14/2015   PLT 233 11/14/2015    Lab Results  Component Value Date   CREATININE 2.08 (H) 11/15/2015   BUN 36 (H) 11/15/2015   NA 126 (L) 11/15/2015   K 5.5 (H) 11/15/2015   CL 96 (L) 11/15/2015   CO2 18 (L) 11/15/2015    Lab Results  Component Value Date   ALT 15 11/05/2015   AST 22 11/05/2015   ALKPHOS 89 11/05/2015     Microbiology: Recent Results (from the past 240 hour(s))  Respiratory Panel by PCR     Status: None   Collection Time: 11/07/15 11:47 AM  Result Value Ref Range Status   Adenovirus NOT DETECTED NOT DETECTED Final   Coronavirus 229E NOT DETECTED NOT DETECTED Final   Coronavirus HKU1 NOT DETECTED NOT DETECTED Final   Coronavirus NL63 NOT DETECTED NOT DETECTED Final   Coronavirus OC43 NOT DETECTED NOT DETECTED Final   Metapneumovirus NOT DETECTED NOT DETECTED Final   Rhinovirus / Enterovirus NOT DETECTED NOT DETECTED Final   Influenza A NOT DETECTED NOT DETECTED Final   Influenza B NOT DETECTED NOT DETECTED Final   Parainfluenza Virus 1 NOT DETECTED NOT DETECTED Final     Parainfluenza Virus 2 NOT DETECTED NOT DETECTED Final   Parainfluenza Virus 3 NOT DETECTED NOT DETECTED Final   Parainfluenza Virus 4 NOT DETECTED NOT DETECTED Final   Respiratory Syncytial Virus NOT DETECTED NOT DETECTED Final   Bordetella pertussis NOT DETECTED NOT DETECTED Final   Chlamydophila pneumoniae NOT DETECTED NOT DETECTED Final   Mycoplasma pneumoniae NOT DETECTED NOT DETECTED Final    Impression/Plan:  1. Presumed PCP pneumonia - still SOB and CXR without much change. Day 9 of Bactrim/steroids and changed back to IV.   2. 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  Can get copay cards at [facility] when discharged.   3. Renal insufficiency - creat has been high but stable on Bactrim.   4. Respiratory distress - concern for aspiration/pneumonia.  Unasyn added.  This certainly could be the progression of PCP.    Dr. Baxter Flattery back tomorrow

## 2015-11-15 NOTE — Progress Notes (Signed)
Pharmacy Antibiotic Note Nancy Acevedo is a 55 y.o. female admitted on 10/22/2015.    The patient has a new diagnosis of HIV/AIDS this admission and is on day #10/21 of Bactrim for presumed PCP PNA. Switched to PO Bactrim on 10/26. ID recommends lower dose of PO Bactrim to prophylaxis dosing after 3 weeks of treatment.   Plans are to add Unasyn this morning due to concern for aspiration PNA. SCr 2.08 << 1.74, CrCl~30-35 ml/min. Bumps in SCr are likely partially due to the inhibited secretion with the TMP component in Bactrim though may also be indicative of worsened renal function. Will monitor closely. UOP not accurately charted.   Plan: 1. Continue Bactrim 2 DS tablets TID for the remainder of 3 weeks, then transition to 1 DS tablet daily 2. Start Unasyn 3g IV every 6 hours 3. Will continue to follow renal function, culture results, LOT, and antibiotic de-escalation plans    Height: '5\' 4"'$  (162.6 cm) Weight: 174 lb 11.2 oz (79.2 kg) IBW/kg (Calculated) : 54.7  Temp (24hrs), Avg:99.2 F (37.3 C), Min:98.7 F (37.1 C), Max:99.5 F (37.5 C)   Recent Labs Lab 11/09/15 0420 11/10/15 0320 11/11/15 0500 11/12/15 0408 11/13/15 0841 11/14/15 0405 11/14/15 1000 11/15/15 0800  WBC 6.2 6.1 7.3 7.6  --   --  10.5  --   CREATININE 1.51* 1.47* 1.62* 1.73* 1.80* 1.74*  --  2.08*    Estimated Creatinine Clearance: 31.8 mL/min (by C-G formula based on SCr of 2.08 mg/dL (H)).    Allergies  Allergen Reactions  . Lexapro [Escitalopram] Itching  . Losartan   . Spironolactone     rash  . Clonidine Derivatives Palpitations   Antimicrobials this admission:  Vanco 10/17 x1 (750 mg); 10/19 x 1 (1g) Zosyn 10/17 >>10/22 Cipro 10/13 >> 10/16 Flagyl 10/13 >> 10/16 Bactrim 10/20 (switched to po on 10/26) >> Azithromycin 10/26 weekly >>  Microbiology results:  10/14 Cdiff >> negative 10/16 Quantiferon Gold >> indeterminate so PPD placed 10/17 - read as negative 10/17 BCx: neg 10/17 MRSA  PCR: neg 10/19 BCx2: neg 10/19 Cdif: neg x2 10/20 HIV: pos 10/21 HIV RNA: ip 10/21 CD4: <10 10/21 HLA: neg 10/21 Resp PCR: neg  Thank you for allowing pharmacy to be a part of this patient's care.  Alycia Rossetti, PharmD, BCPS Clinical Pharmacist Pager: [phone] 11/15/2015 10:50 AM

## 2015-11-15 NOTE — Progress Notes (Addendum)
PULMONARY / CRITICAL CARE MEDICINE   Name: Nancy Acevedo MRN: [id number] DOB: 27-Dec-1960    ADMISSION DATE:  11/12/2015 CONSULTATION DATE:  10/20  REFERRING MD:  [facility]  CHIEF COMPLAINT: Can't breathe  briuef 55 yo with PMH of recent dx of Crohn's and reported chest pain, left leg pain and increasing SOB x 3 months. She was admitted 10/13 and treated for gastic infections associated with her Crohn's  disease but now has BASDZ and acute hypoxia and will be moved to ICU and possibly intubated.  Events 10/23/2015  -admit  10/14 - fever resolved 11/02/15 - fever back 10/17 -- MRSA Pcr negative 11/06/15- ICU tx 11/07/15 - HIV +ve from10/20/17, RVP  Negative, C Diff negative .STart bipap and lasix. ID cnsult -> Rx PCP (DO NOT DISCLOSE TO FAMILY). AUTOIMMUNE ORDERED  SUBJECTIVE/OVERNIGHT/INTERVAL HX 10/29 called back by Dr. Verlon Au to evaluate for worsening hypoxemic respiratory failure.  Patient is now on BiPAP at 70% for work of breathing.   VITAL SIGNS: BP 131/88 (BP Location: Left Arm)   Pulse (!) 120   Temp 99.8 F (37.7 C) (Axillary)   Resp (!) 27   Ht 5\' 4"  (1.626 m)   Wt 79.2 kg (174 lb 11.2 oz)   SpO2 100%   BMI 29.99 kg/m   HEMODYNAMICS:    VENTILATOR SETTINGS: Vent Mode: BIPAP FiO2 (%):  [40 %-100 %] 100 % Set Rate:  [12 bmp] 12 bmp PEEP:  [6 cmH20] 6 cmH20  INTAKE / OUTPUT: I/O last 3 completed shifts: In: 156 [P.O.:120; I.V.:36] Out: 950 [Urine:950]  PHYSICAL EXAMINATION: General:  Moderate respiratory distress on BiPAP with 70% FiO2. Neuro:  Intact, alert and interactive, moving all ext to command HEENT:No JVD/LAN, Carter/AT, PERRL, EOM-I Cardiovascular:  RRR, Nl S1/S2, -M/R/G. Lungs:  Diffuse crackles. Abdomen:  Tender, soft +bs Musculoskeletal:  Intact, -edema and -tenderness. Skin: warm and dry  LABS:  PULMONARY  Recent Labs Lab 11/15/15 0915  PHART 7.423  PCO2ART 27.5*  PO2ART 56.4*  HCO3 17.6*  O2SAT 84.1   CBC  Recent Labs Lab  11/11/15 0500 11/12/15 0408 11/14/15 1000  HGB 8.5* 8.1* 8.5*  HCT 25.1* 23.8* 25.2*  WBC 7.3 7.6 10.5  PLT 287 295 233   COAGULATION No results for input(s): INR in the last 168 hours.  CARDIAC  No results for input(s): TROPONINI in the last 168 hours. No results for input(s): PROBNP in the last 168 hours.  CHEMISTRY  Recent Labs Lab 11/09/15 0420 11/10/15 0320 11/11/15 0500 11/12/15 0408 11/13/15 0841 11/14/15 0405 11/15/15 0800  NA 128* 124* 126* 122* 124* 126* 126*  K 4.7 4.7 5.2* 5.2* 5.1 4.9 5.5*  CL 95* 92* 93* 90* 92* 96* 96*  CO2 21* 22 21* 20* 19* 22 18*  GLUCOSE 133* 218* 170* 151* 89 81 81  BUN 19 19 22* 27* 29* 27* 36*  CREATININE 1.51* 1.47* 1.62* 1.73* 1.80* 1.74* 2.08*  CALCIUM 7.7* 7.7* 8.2* 8.0* 8.3* 8.2* 8.3*  MG 1.9 1.8 1.9  --   --   --   --   PHOS 3.1 3.7 4.1 4.8*  --   --   --    Estimated Creatinine Clearance: 31.8 mL/min (by C-G formula based on SCr of 2.08 mg/dL (H)).  LIVER  Recent Labs Lab 11/12/15 0408  ALBUMIN 2.3*   INFECTIOUS No results for input(s): LATICACIDVEN, PROCALCITON in the last 168 hours.  ENDOCRINE CBG (last 3)   Recent Labs  11/14/15 2256 11/15/15 0828 11/15/15 1301  GLUCAP 110* 111* 115*   IMAGING x48h  - image(s) personally visualized  -   highlighted in bold Dg Chest 2 View  Result Date: 11/14/2015 CLINICAL DATA:  Pneumonia.  Shortness of breath.  Fever. EXAM: CHEST  2 VIEW COMPARISON:  11/11/2015 chest radiograph. FINDINGS: Right PICC terminates at the cavoatrial junction. Stable cardiomediastinal silhouette with normal heart size. No pneumothorax. No pleural effusion. Extensive patchy opacities throughout both lungs have not appreciably changed. IMPRESSION: Extensive patchy opacities throughout both lungs have not appreciably changed, most suggestive of multilobar pneumonia. Follow-up post treatment chest imaging to document resolution of these opacities is necessary to exclude other etiologies.  Electronically Signed   By: Ilona Sorrel M.D.   On: 11/14/2015 11:15   Dg Chest Port 1 View  Result Date: 11/15/2015 CLINICAL DATA:  Pneumonia EXAM: PORTABLE CHEST 1 VIEW COMPARISON:  Chest radiograph from one day prior. FINDINGS: Right PICC terminates in the lower third of the superior vena cava. Stable cardiomediastinal silhouette with normal heart size. No pneumothorax. No pleural effusion. Extensive patchy lung opacities throughout both lungs have not appreciably changed. IMPRESSION: No appreciable change in extensive patchy lung opacities throughout both lungs most suggestive of multilobar pneumonia. Follow-up chest imaging to resolution. Electronically Signed   By: Ilona Sorrel M.D.   On: 11/15/2015 09:35   cxr 10/22 - improved ppersonally visualizeed    DISCUSSION: 55 yo with PMH of recent dx of Crohn's and reported chest pain, left leg pain and increasing SOB x 3 months. She was admitted 10/13 and treated for gastic infections associated with her Crohn's  disease but now has BASDZ and acute hypoxia and will be moved to ICU and possibly intubated.  ASSESSMENT / PLAN:  PULMONARY A: Acute hypoxic resp failure with infiltrates - due to PCP and pulmonary edema  P:   BiPAP for respiratory failure (hypoxemic) from presumed PCP. Needs aggressive diureses, will give two additional doses of lasix (recognize that  Continue ABx per ID Steroids for PCP  CARDIOVASCULAR A:  HTN CC 1/17 with no CAD Chest pain x 2 mos Hx of Syncope P:  Back on home hypertensives Lasix  RENAL A:   CRI with possible AKI, -5L since admission but not sure if the ins part is documented offecially. Hyperkalemia  P:   Follow creatine Lasix 40 mg IV q8 x2 doses. 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  Rush Farmer, M.D. [facility] Pulmonary/Critical Care Medicine. Pager: [phone]. After hours pager: [phone].  11/15/2015 1:52 PM

## 2015-11-15 NOTE — Progress Notes (Signed)
[facility] PROGRESS NOTE  Nancy Acevedo FYB:[id number] DOB: 28-Sep-1960 DOA: 10/28/2015 PCP: Binnie Rail, MD  Interim summary and HPI  33 ? Hypertension,  chronic kidney disease stage II,  OSA,  GERD, and  recent diagnosis of Crohn's colitis managed with prednisone  presented to the [facility] 11/17/2015  2-3 days of fevers, nausea, vomiting, and nonbloody diarrhea. insidious development of abdominal pain approximately 6 months ago and underwent a colonoscopy in July of this year with a large ulcer at the terminal ileum and several punctate deep ulcers in the right colon, with pathology consistent with Crohn's Admit with abdominal pain as severe, cramping in character, localized to the lower abdomen, and associated with loss of appetite.  On 10/20 her resp status decompensated and patient was moved to ICU.  Work up demonstrated bilateral infiltrates and ground glass opacity. 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  bactrim IV and steroids, ID is on board.  Patient resp status de stabilized again after brief interlude outside ICU and CCM was consulted again 10/29 ssit with management  Assessment/Plan:  Sepsis: due to AIDS/PCP PNA ID on board, will follow rec's -patient CD4 count < 10 -treating her with IV bactrim and steroids-was switched back to IV formulation 10/29 given respiratory compromise -continue Tivicay and Descovy -HLA B5701 and viral load reflex still pending -TOxo and Crytpo neg -3 weeks Rx for PCP to stop on 11/26/15 and would continue steroids up until then -Repeat blood cultures w/o growth from 10/28  Acute resp failure with hypoxia: due to PCP PNA, ARDS and ALI. -ABG 7.42/27/56 -needed Bipap given ? 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 -C diff negative  -Will follow GI rec's as OP- repeat colonoscopy once HIV is well controlled to reevaluate diagnosis of IBD vs GI inflammation from HIV). -CMV was ordered  -Diarrhea in fact seems better   Chest pain/Elevated troponin -demand ischemia in setting of sepsis -with recent work up during this year, 02/11/15 cardiac cath--normal coronaries -no signs of acute ischemia on EKG -Echocardiogram--EF 55-65%, no WMA, trivial MR/TR  Indeterminant QuanteriFERON PPD was placed so far negative  -checking for latent TB for remicade -NO clinical suspicion for active pulm TB at this moment -per ID, consider repeat TB testing once immune system is reconstituted   Acute on chronic kidney disease: stage 2 at baseline -Baseline creatinine 1-1.2 -currently 1.8-->1.7--->2.08 -Careful use lasix, is dry but some confluence on CXR  Hypertension -metoprolol tartrate 50 twice a day-note soft BP 10/28.  Cut back dose to 12.5 bid -pt endorsed poor compliance with meds at home missing 2-3 days per week approx. -is dizzy when she stands  -d/c Amlodipine 10 on 10/27   Diabetes mellitus type 2 - stable -10/02/2015 hemoglobin A1c 6.6 -Continue NovoLog sliding scale  Sugars 111-193  GERD -10/22/14--EGD--The esophageal mucosa was abnormal appearing throughout but mainly in the proximal 1/2 of the esophagus. -will continue PPI  Thrombocytopenia on admission 111 -likely due to sepsis -currently 233 -will monitor trend -no  signs of bleeding appreciated  Diarrhea: -due to cron's  -will add florastor -per GI, ok to use loperamide or  lomotil if needed  -neg C. Diff  Probable hypervolemic Hyponatremia and a setting of chronic diarrhea, hypokalemia and now hyperkalemia -due to ongoing diarrhea, and for her hyponatremia PCP PNA also contributing--diarrhea is somewhat better however -will replete electrolytes and follow trend.  Anemia -  -AOCD  -transfused 2 units PRBCs 11/01/15 -Hgb stable -no signs of bleeding appreciated  Code Status: Full Family Communication: no family at bedside  Disposition Plan: Continue treatment as above  Consultants:  GI (sign off)  PCCM re-consulted 10/29  ID  Procedures:  See below for x-ray reports   Antibiotics: Anti-infectives    Start     Dose/Rate Route Frequency Ordered Stop   11/13/15 0000  dolutegravir (TIVICAY) 50 MG tablet     50 mg Oral Daily 11/12/15 1622     11/13/15 0000  emtricitabine-tenofovir AF (DESCOVY) 200-25 MG tablet     1 tablet Oral Daily 11/12/15 1622     11/12/15 1000  sulfamethoxazole-trimethoprim (BACTRIM DS,SEPTRA DS) 800-160 MG per tablet 1 tablet  Status:  Discontinued     1 tablet Oral Every 12 hours 11/12/15 0729 11/12/15 0829   11/12/15 1000  sulfamethoxazole-trimethoprim (BACTRIM DS,SEPTRA DS) 800-160 MG per tablet 2 tablet     2 tablet Oral 3 times daily 11/12/15 0829 11/27/15 0959   11/12/15 1000  azithromycin (ZITHROMAX) tablet 1,200 mg     1,200 mg Oral Weekly 11/12/15 0927     11/07/15 1700  dolutegravir (TIVICAY) tablet 50 mg     50 mg Oral Daily 11/07/15 1556     11/07/15 1700  emtricitabine-tenofovir AF (DESCOVY) 200-25 MG per tablet 1 tablet     1 tablet Oral Daily 11/07/15 1556     11/06/15 1400  sulfamethoxazole-trimethoprim (BACTRIM) 320 mg of trimethoprim in dextrose 5 % 500 mL IVPB  Status:  Discontinued     320 mg of trimethoprim 346.7 mL/hr over 90 Minutes Intravenous Every 6 hours 11/06/15 1305 11/06/15 1310   11/06/15 1400  sulfamethoxazole-trimethoprim (BACTRIM) 320 mg of trimethoprim in dextrose 5 % 500 mL IVPB   Status:  Discontinued     320 mg of trimethoprim 346.7 mL/hr over 90 Minutes Intravenous Every 8 hours 11/06/15 1310 11/06/15 1312   11/06/15 1400  sulfamethoxazole-trimethoprim (BACTRIM) 320 mg of trimethoprim in dextrose 5 % 500 mL IVPB  Status:  Discontinued     320 mg of trimethoprim 346.7 mL/hr over 90 Minutes Intravenous Every 6 hours 11/06/15 1312 11/12/15 0729   11/05/15 1530  vancomycin (VANCOCIN) IVPB 1000 mg/200 mL premix     1,000 mg 200 mL/hr over 60 Minutes Intravenous  Once 11/05/15 1456 11/05/15 1710   11/03/15 0600  vancomycin (VANCOCIN) IVPB 750 mg/150 ml premix  Status:  Discontinued     750 mg 150 mL/hr over 60 Minutes Intravenous Every 12 hours 11/03/15 0550 11/03/15 1350   11/03/15 0600  piperacillin-tazobactam (ZOSYN) IVPB 3.375 g  Status:  Discontinued     3.375 g 12.5 mL/hr over 240 Minutes Intravenous Every 8 hours 11/03/15 0550 11/08/15 1020   11/02/15 2000  ciprofloxacin (CIPRO) tablet 500 mg  Status:  Discontinued     500 mg Oral 2 times daily 11/02/15 0936 11/03/15 0536   11/02/15 1400  metroNIDAZOLE (FLAGYL) tablet 500 mg  Status:  Discontinued     500 mg Oral Every 8 hours 11/02/15 0936 11/03/15 0536   10/31/15  0800  ciprofloxacin (CIPRO) IVPB 400 mg  Status:  Discontinued     400 mg 200 mL/hr over 60 Minutes Intravenous Every 12 hours 11/08/2015 2308 11/02/15 0936   10/31/15 0300  metroNIDAZOLE (FLAGYL) IVPB 500 mg  Status:  Discontinued     500 mg 100 mL/hr over 60 Minutes Intravenous Every 8 hours 10/22/2015 2239 11/02/15 0936   10/23/2015 1915  ciprofloxacin (CIPRO) IVPB 400 mg     400 mg 200 mL/hr over 60 Minutes Intravenous  Once 11/04/2015 1909 11/16/2015 2054   10/29/2015 1915  metroNIDAZOLE (FLAGYL) IVPB 500 mg     500 mg 100 mL/hr over 60 Minutes Intravenous  Once 10/20/2015 1909 10/19/2015 2047       HPI/Subjective:  ?  wob Afebrile today but had fever yesterday No n/v Coughing Needing Bipap  Objective: Vitals:   11/15/15 0349 11/15/15 0728   BP: 108/72 123/90  Pulse: 93 (!) 111  Resp: (!) 29 (!) 38  Temp: 99.3 F (37.4 C) 99.1 F (37.3 C)    Intake/Output Summary (Last 24 hours) at 11/15/15 0837 Last data filed at 11/14/15 2133  Gross per 24 hour  Intake              153 ml  Output              500 ml  Net             -347 ml   Filed Weights   11/09/15 0500 11/12/15 0345 11/15/15 0426  Weight: 89.6 kg (197 lb 8 oz) 83.9 kg (185 lb) 79.2 kg (174 lb 11.2 oz)    Exam:   General:  distress, denies CP. Feels weak overall  Cardiovascular: S1 and S2, no rubs, no gallops, no JVD Respiratory: ? accessory muscle, coarse crepitations bilat  Abdomen: positive BS, diffuse mil tenderness with palpation, no guarding, no distension   Musculoskeletal: trace edema bilaterally, no cyanosis, no clubbing   Data Reviewed: Basic Metabolic Panel:  Recent Labs Lab 11/09/15 0420 11/10/15 0320 11/11/15 0500 11/12/15 0408 11/13/15 0841 11/14/15 0405 11/15/15 0800  NA 128* 124* 126* 122* 124* 126* 126*  K 4.7 4.7 5.2* 5.2* 5.1 4.9 5.5*  CL 95* 92* 93* 90* 92* 96* 96*  CO2 21* 22 21* 20* 19* 22 18*  GLUCOSE 133* 218* 170* 151* 89 81 81  BUN 19 19 22* 27* 29* 27* 36*  CREATININE 1.51* 1.47* 1.62* 1.73* 1.80* 1.74* 2.08*  CALCIUM 7.7* 7.7* 8.2* 8.0* 8.3* 8.2* 8.3*  MG 1.9 1.8 1.9  --   --   --   --   PHOS 3.1 3.7 4.1 4.8*  --   --   --    Liver Function Tests:  Recent Labs Lab 11/12/15 0408  ALBUMIN 2.3*   CBC:  Recent Labs Lab 11/09/15 0420 11/10/15 0320 11/11/15 0500 11/12/15 0408 11/14/15 1000  WBC 6.2 6.1 7.3 7.6 10.5  NEUTROABS 6.0 6.0 7.1 7.5 10.3*  HGB 8.7* 8.6* 8.5* 8.1* 8.5*  HCT 25.6* 25.2* 25.1* 23.8* 25.2*  MCV 84.2 83.2 83.7 82.6 84.6  PLT 221 231 287 295 233   BNP (last 3 results)  Recent Labs  10/31/15 1516 11/03/15 1430  BNP 299.7* 143.2*   CBG:  Recent Labs Lab 11/14/15 1215 11/14/15 1227 11/14/15 1624 11/14/15 2256 11/15/15 0828  GLUCAP 415* 193* 230* 110* 111*       Studies: Dg Chest 2 View  Result Date: 11/14/2015 CLINICAL DATA:  Pneumonia.  Shortness of breath.  Fever. EXAM: CHEST  2 VIEW COMPARISON:  11/11/2015 chest radiograph. FINDINGS: Right PICC terminates at the cavoatrial junction. Stable cardiomediastinal silhouette with normal heart size. No pneumothorax. No pleural effusion. Extensive patchy opacities throughout both lungs have not appreciably changed. IMPRESSION: Extensive patchy opacities throughout both lungs have not appreciably changed, most suggestive of multilobar pneumonia. Follow-up post treatment chest imaging to document resolution of these opacities is necessary to exclude other etiologies. Electronically Signed   By: Ilona Sorrel M.D.   On: 11/14/2015 11:15    Scheduled Meds: . azithromycin  1,200 mg Oral Weekly  . diphenoxylate-atropine  5 mL Oral QID  . dolutegravir  50 mg Oral Daily  . emtricitabine-tenofovir AF  1 tablet Oral Daily  . enoxaparin (LOVENOX) injection  40 mg Subcutaneous Q24H  . furosemide  40 mg Oral Daily  . insulin aspart  0-5 Units Subcutaneous QHS  . insulin aspart  0-9 Units Subcutaneous TID WC  . ipratropium-albuterol  3 mL Nebulization TID  . metoprolol tartrate  12.5 mg Oral BID  . pantoprazole  40 mg Oral Daily  . predniSONE  40 mg Oral QAC breakfast  . saccharomyces boulardii  250 mg Oral BID  . sodium chloride flush  10-40 mL Intracatheter Q12H  . sodium chloride flush  3 mL Intravenous Q12H  . sodium chloride  1 g Oral BID WC  . sulfamethoxazole-trimethoprim  2 tablet Oral TID      Time spent:30 minutes     Verneita Griffes, MD [facility] [phone]

## 2015-11-16 ENCOUNTER — Other Ambulatory Visit: Payer: Self-pay | Admitting: Pharmacist

## 2015-11-16 ENCOUNTER — Inpatient Hospital Stay (HOSPITAL_COMMUNITY): Payer: BC Managed Care – PPO

## 2015-11-16 LAB — COMPREHENSIVE METABOLIC PANEL
ALBUMIN: 2.1 g/dL — AB (ref 3.5–5.0)
ALT: 14 U/L (ref 14–54)
ANION GAP: 11 (ref 5–15)
AST: 36 U/L (ref 15–41)
Alkaline Phosphatase: 154 U/L — ABNORMAL HIGH (ref 38–126)
BUN: 44 mg/dL — ABNORMAL HIGH (ref 6–20)
CHLORIDE: 99 mmol/L — AB (ref 101–111)
CO2: 19 mmol/L — ABNORMAL LOW (ref 22–32)
Calcium: 8 mg/dL — ABNORMAL LOW (ref 8.9–10.3)
Creatinine, Ser: 2.53 mg/dL — ABNORMAL HIGH (ref 0.44–1.00)
GFR calc non Af Amer: 21 mL/min — ABNORMAL LOW (ref >60)
GFR, EST AFRICAN AMERICAN: 24 mL/min — AB (ref >60)
GLUCOSE: 169 mg/dL — AB (ref 65–99)
POTASSIUM: 5.6 mmol/L — AB (ref 3.5–5.1)
SODIUM: 129 mmol/L — AB (ref 135–145)
Total Bilirubin: 0.4 mg/dL (ref 0.3–1.2)
Total Protein: 5.3 g/dL — ABNORMAL LOW (ref 6.5–8.1)

## 2015-11-16 LAB — GLUCOSE, CAPILLARY
GLUCOSE-CAPILLARY: 150 mg/dL — AB (ref 65–99)
GLUCOSE-CAPILLARY: 167 mg/dL — AB (ref 65–99)
GLUCOSE-CAPILLARY: 205 mg/dL — AB (ref 65–99)
Glucose-Capillary: 192 mg/dL — ABNORMAL HIGH (ref 65–99)

## 2015-11-16 LAB — BLOOD GAS, ARTERIAL
Acid-base deficit: 7.3 mmol/L — ABNORMAL HIGH (ref 0.0–2.0)
BICARBONATE: 17.1 mmol/L — AB (ref 20.0–28.0)
DELIVERY SYSTEMS: POSITIVE
DRAWN BY: 246101
FIO2: 60
MODE: POSITIVE
O2 Saturation: 88.9 %
PEEP/CPAP: 6 cmH2O
PO2 ART: 65.8 mmHg — AB (ref 83.0–108.0)
Patient temperature: 98.6
Pressure control: 12 cmH2O
RATE: 12 resp/min
pCO2 arterial: 30.6 mmHg — ABNORMAL LOW (ref 32.0–48.0)
pH, Arterial: 7.365 (ref 7.350–7.450)

## 2015-11-16 LAB — CBC WITH DIFFERENTIAL/PLATELET
BASOS ABS: 0 10*3/uL (ref 0.0–0.1)
Basophils Relative: 0 %
EOS ABS: 0 10*3/uL (ref 0.0–0.7)
Eosinophils Relative: 0 %
HCT: 22.6 % — ABNORMAL LOW (ref 36.0–46.0)
HEMOGLOBIN: 7.7 g/dL — AB (ref 12.0–15.0)
LYMPHS ABS: 0.1 10*3/uL — AB (ref 0.7–4.0)
LYMPHS PCT: 1 %
MCH: 28.5 pg (ref 26.0–34.0)
MCHC: 34.1 g/dL (ref 30.0–36.0)
MCV: 83.7 fL (ref 78.0–100.0)
MONO ABS: 0 10*3/uL — AB (ref 0.1–1.0)
Monocytes Relative: 0 %
NEUTROS ABS: 7.5 10*3/uL (ref 1.7–7.7)
Neutrophils Relative %: 99 %
PLATELETS: 133 10*3/uL — AB (ref 150–400)
RBC: 2.7 MIL/uL — ABNORMAL LOW (ref 3.87–5.11)
RDW: 16.4 % — AB (ref 11.5–15.5)
WBC: 7.6 10*3/uL (ref 4.0–10.5)

## 2015-11-16 MED ORDER — SULFAMETHOXAZOLE-TRIMETHOPRIM 400-80 MG/5ML IV SOLN
160.0000 mg | Freq: Three times a day (TID) | INTRAVENOUS | Status: DC
  Administered 2015-11-16 – 2015-11-19 (×9): 160 mg via INTRAVENOUS
  Filled 2015-11-16 (×15): qty 10

## 2015-11-16 MED ORDER — DOLUTEGRAVIR SODIUM 50 MG PO TABS: 50.0000 mg | ORAL_TABLET | Freq: Every day | ORAL | 11 refills | Status: AC

## 2015-11-16 MED ORDER — SODIUM CHLORIDE 0.9 % IV SOLN
3.0000 g | Freq: Two times a day (BID) | INTRAVENOUS | Status: DC
  Administered 2015-11-16 – 2015-11-22 (×12): 3 g via INTRAVENOUS
  Filled 2015-11-16 (×14): qty 3

## 2015-11-16 MED ORDER — EMTRICITABINE-TENOFOVIR AF 200-25 MG PO TABS: 1.0000 | ORAL_TABLET | Freq: Every day | ORAL | 11 refills | Status: AC

## 2015-11-16 NOTE — Progress Notes (Signed)
Pt took a small sip of water started to cough and spit water out.  PT feels like she was able to clear the water completely out of her airway.  Vitals stable.  Sat 97%, breath sounds clear/diminished. Will monitor.

## 2015-11-16 NOTE — Plan of Care (Signed)
Problem: Health Behavior/Discharge Planning: Goal: Ability to manage health-related needs will improve Outcome: Progressing Pt has demonstrated some improvement in her willingness to assist with and participate in her care.  She is more interactive in regards to turns and repositioning today.

## 2015-11-16 NOTE — Progress Notes (Signed)
PULMONARY / CRITICAL CARE MEDICINE   Name: Nancy Acevedo MRN: [id number] DOB: 24-May-1960    ADMISSION DATE:  11/01/2015 CONSULTATION DATE:  10/20  REFERRING MD:  [facility]  CHIEF COMPLAINT: Can't breathe  History: 55 Y/O with hypertension, chronic kidney disease stage II, OSA, GERD, and recent diagnosis of Crohn's colitis managed with prednisone Admittedwith resp distress, fevers, B/L infitrates.  She was diagnosed with HIV, AIDS CD4 count <4. Presumed PCP vs bacterial PNA. Getting treatment with Abx, bactrim and steroids. Also on HAART therapy as per ID  Events 10/28/2015  -admit  10/14 - fever resolved 11/02/15 - fever back 10/17 -- MRSA Pcr negative 11/06/15- ICU tx 11/07/15 - HIV +ve from10/20/17, RVP  Negative, C Diff negative .STart bipap and lasix. ID consult -> Rx PCP (DO NOT DISCLOSE TO FAMILY). AUTOIMMUNE ORDERED 10/29- Re consult for ongoing hypoxia  SUBJECTIVE/OVERNIGHT/INTERVAL HX 10/29 called back by to evaluate for worsening hypoxemic respiratory failure. Pt has been alternating between Bipap and HFNC. Became hypoxic today AM when taken off the bipap.   VITAL SIGNS: BP 120/78 (BP Location: Left Arm)   Pulse 96   Temp 97.1 F (36.2 C) (Axillary)   Resp (!) 25   Ht 5\' 4"  (1.626 m)   Wt 175 lb 14.4 oz (79.8 kg)   SpO2 100%   BMI 30.19 kg/m   HEMODYNAMICS:    VENTILATOR SETTINGS: Vent Mode: PCV FiO2 (%):  [80 %-100 %] 80 % Set Rate:  [12 bmp] 12 bmp PEEP:  [6 cmH20] 6 cmH20  INTAKE / OUTPUT: I/O last 3 completed shifts: In: 29 [P.O.:120; I.V.:56; IV Piggyback:600] Out: 1325 [Urine:1325]  PHYSICAL EXAMINATION: General:  No resp distress on Bipap Neuro:  Intact, alert and interactive, moving all ext to command, No focal deficits HEENT:No JVD, thyromegaly Cardiovascular:  RRR, Nl S1/S2, No MRG. Lungs:  Scattered crackles. Abdomen:  Tender, soft +bs Musculoskeletal:  Intact, edema and tenderness. Skin: warm and dry  LABS:  PULMONARY  Recent  Labs Lab 11/15/15 0915  PHART 7.423  PCO2ART 27.5*  PO2ART 56.4*  HCO3 17.6*  O2SAT 84.1   CBC  Recent Labs Lab 11/12/15 0408 11/14/15 1000 11/16/15 0419  HGB 8.1* 8.5* 7.7*  HCT 23.8* 25.2* 22.6*  WBC 7.6 10.5 7.6  PLT 295 233 133*   COAGULATION No results for input(s): INR in the last 168 hours.  CARDIAC  No results for input(s): TROPONINI in the last 168 hours. No results for input(s): PROBNP in the last 168 hours.  CHEMISTRY  Recent Labs Lab 11/10/15 0320 11/11/15 0500 11/12/15 0408 11/13/15 0841 11/14/15 0405 11/15/15 0800 11/16/15 0419  NA 124* 126* 122* 124* 126* 126* 129*  K 4.7 5.2* 5.2* 5.1 4.9 5.5* 5.6*  CL 92* 93* 90* 92* 96* 96* 99*  CO2 22 21* 20* 19* 22 18* 19*  GLUCOSE 218* 170* 151* 89 81 81 169*  BUN 19 22* 27* 29* 27* 36* 44*  CREATININE 1.47* 1.62* 1.73* 1.80* 1.74* 2.08* 2.53*  CALCIUM 7.7* 8.2* 8.0* 8.3* 8.2* 8.3* 8.0*  MG 1.8 1.9  --   --   --   --   --   PHOS 3.7 4.1 4.8*  --   --   --   --    Estimated Creatinine Clearance: 26.3 mL/min (by C-G formula based on SCr of 2.53 mg/dL (H)).  LIVER  Recent Labs Lab 11/12/15 0408 11/16/15 0419  AST  --  36  ALT  --  14  ALKPHOS  --  154*  BILITOT  --  0.4  PROT  --  5.3*  ALBUMIN 2.3* 2.1*   INFECTIOUS No results for input(s): LATICACIDVEN, PROCALCITON in the last 168 hours.  ENDOCRINE CBG (last 3)   Recent Labs  11/15/15 1624 11/15/15 2200 11/16/15 0858  GLUCAP 203* 191* 150*   IMAGING x48h  - image(s) personally visualized  -   highlighted in bold Dg Chest Port 1 View  Result Date: 11/16/2015 CLINICAL DATA:  Hypoxia EXAM: PORTABLE CHEST 1 VIEW COMPARISON:  11/15/2015 FINDINGS: Cardiomediastinal silhouette is stable. Right PICC line is unchanged in position. Persistent patchy airspace disease bilateral probable improving pneumonia. Mild improvement in aeration in left upper lobe. IMPRESSION: Persistent patchy airspace disease bilateral probable improving pneumonia.  Mild improvement in aeration in left upper lobe. Electronically Signed   By: Lahoma Crocker M.D.   On: 11/16/2015 10:17   Dg Chest Port 1 View  Result Date: 11/15/2015 CLINICAL DATA:  Pneumonia EXAM: PORTABLE CHEST 1 VIEW COMPARISON:  Chest radiograph from one day prior. FINDINGS: Right PICC terminates in the lower third of the superior vena cava. Stable cardiomediastinal silhouette with normal heart size. No pneumothorax. No pleural effusion. Extensive patchy lung opacities throughout both lungs have not appreciably changed. IMPRESSION: No appreciable change in extensive patchy lung opacities throughout both lungs most suggestive of multilobar pneumonia. Follow-up chest imaging to resolution. Electronically Signed   By: Ilona Sorrel M.D.   On: 11/15/2015 09:35     DISCUSSION: 55 yo with PMH of recent dx of Crohn's and reported chest pain, left leg pain and increasing SOB x 3 months. She was admitted 10/13 and treated for gastic infections associated with her Crohn's  Disease. Recent diagnosis of HIV, AIDS with B.L pulmonary infiltrates. Being empirically treated for PCP vs PNA. PCCM reconsulted for ongoing hypoxia.  She appears comfortable on Bipap. I dont believe she need intubation right now. We will continue to monitor closely and move to ICU with any deterioration. I would not recommend intubating just to get a bronchoscopy as I dont think the yield would be good after many days of anti PCP and antibacterial therapy.  CXR today 10/31 looks slightly improved. We will attempt transition to HFNC.\  ASSESSMENT / PLAN:  PULMONARY A: Acute hypoxic resp failure with infiltrates - due to PCP and pulmonary edema  P:   BiPAP for respiratory failure (hypoxemic) from presumed PCP. Continue diuresis as tolerated by Cr.  Continue Abx per ID Steroids for PCP  CARDIOVASCULAR A:  HTN CC 1/17 with no CAD Hx of Syncope P:  Lasix as tolerated  RENAL A:   CRI with possible AKI, -5L since admission  but not sure if the ins part is documented offecially. Hyperkalemia  P:   Follow urine output and Cr.   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  Critical care time- 35 mins.  Marshell Garfinkel MD [facility] Pager [phone] If no answer or  after 3pm call: [phone] 11/16/2015, 12:06 PM

## 2015-11-16 NOTE — Plan of Care (Signed)
Problem: Health Behavior/Discharge Planning: Goal: Ability to manage health-related needs will improve Outcome: Not Progressing Pt is refusing to participate in most areas of her care. She expresses feeling depressed concerning her recent diagnosis.

## 2015-11-16 NOTE — Progress Notes (Signed)
[facility] PROGRESS NOTE  Nancy Acevedo [id number] DOB: 1960/08/20 DOA: 10/21/2015 PCP: Binnie Rail, MD  Interim summary and HPI  51 ? Hypertension,  chronic kidney disease stage II,  OSA,  GERD, and  recent diagnosis of Crohn's colitis managed with prednisone  presented to the [facility] 10/23/2015  2-3 days of fevers, nausea, vomiting, and nonbloody diarrhea. insidious development of abdominal pain approximately 6 months ago and underwent a colonoscopy in July of this year with a large ulcer at the terminal ileum and several punctate deep ulcers in the right colon, with pathology consistent with Crohn's Admit with abdominal pain as severe, cramping in character, localized to the lower abdomen, and associated with loss of appetite.  On 10/20 her resp status decompensated and patient was moved to ICU.  Work up demonstrated bilateral infiltrates and ground glass opacity. 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  bactrim IV and steroids, ID is on board.  Patient resp status de stabilized again after brief interlude outside ICU and CCM was consulted again 10/29 assist with management  Assessment/Plan:  Sepsis: due to AIDS/PCP PNA ID on board, will follow rec's -patient CD4 count < 10 -treating her with IV bactrim and steroids-was switched back to IV formulation 10/29 given respiratory compromise -continue Tivicay and Descovy -HLA B5701 and viral load reflex still pending -TOxo and Crytpo neg -3 weeks Rx for PCP to stop on 11/26/15 and would continue steroids up until then as taper once improves -Repeat blood cultures w/o growth from 10/28 and pending final  Acute resp failure with hypoxia: due to PCP PNA, ARDS and ALI. -ABG 7.42/27/56 -needed Bipap given ? 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 -C diff negative  -Will follow GI rec's as OP- repeat colonoscopy once HIV is well controlled to reevaluate diagnosis of IBD vs GI inflammation from HIV). -CMV was ordered  -Diarrhea  seems better   Chest pain/Elevated troponin -demand ischemia in setting of sepsis -with recent work up during this year, 02/11/15 cardiac cath--normal coronaries -no signs of acute ischemia on EKG -Echocardiogram--EF 55-65%, no WMA, trivial MR/TR  Indeterminant QuanteriFERON PPD was placed so far negative  -checking for latent TB for remicade -NO clinical suspicion for active pulm TB at this moment -per ID, consider repeat TB testing once immune system is reconstituted   Acute on chronic kidney disease: stage 2 at baseline -Baseline creatinine 1-1.2 -currently 1.8-->1.7--->2.08-->2.53 -Careful use lasix, is dry but some confluence on CXR -Will follow am labs and reassess  Hypertension -metoprolol tartrate 50 twice a day-note soft BP 10/28.  Cut back dose to 12.5 bid, -d/c Amlodipine 10 on 10/27  -pt endorsed poor compliance with meds at home missing 2-3 days per week approx. -is dizzy when she stands  -PT working with her for Vestibular issues  Diabetes mellitus type 2 - stable -10/02/2015 hemoglobin A1c 6.6 -Continue NovoLog sliding scale  -Sugars 150 this am. -check q4 hrly as on IV steroids  GERD -10/22/14--EGD--The esophageal mucosa was  abnormal appearing throughout but mainly in the proximal 1/2 of the esophagus. -will continue  PPI  Thrombocytopenia on admission 111 -likely due to sepsis -currently 233 -will monitor trend -no signs of bleeding appreciated  Diarrhea: -due to cron's  -will add florastor -per GI, ok to use loperamide or lomotil if needed  -neg C. Diff  Probable hypervolemic Hyponatremia and a setting of chronic diarrhea, hypokalemia and now hyperkalemia -due to ongoing diarrhea, and for her hyponatremia PCP PNA also contributing--diarrhea is somewhat better however -will replete electrolytes and follow trend.  Anemia -  -AOCD  -transfused 2 units PRBCs 11/01/15 -Hgb stable -no signs of bleeding appreciated  Code Status: Full Family Communication: no family at bedside  Disposition Plan: Continue treatment as above  Consultants:  GI (sign off)  PCCM re-consulted 10/29  ID  Procedures:  See below for x-ray reports   HPI/Subjective:  ?  wob desats off Bipap NO CP No further diarrhea Is effectively NPO   Objective: Vitals:   11/16/15 0757 11/16/15 0913  BP: 120/78   Pulse:  96  Resp:  (!) 25  Temp: 97.1 F (36.2 C)     Intake/Output Summary (Last 24 hours) at 11/16/15 1001 Last data filed at 11/16/15 0757  Gross per 24 hour  Intake              743 ml  Output              825 ml  Net              -82 ml   Filed Weights   11/15/15 0426 11/16/15 0419 11/16/15 0757  Weight: 79.2 kg (174 lb 11.2 oz) 79.5 kg (175 lb 4.8 oz) 79.8 kg (175 lb 14.4 oz)    Exam:   General:  distress, denies CP.  Cardiovascular: S1 and S2, no rubs, no gallops, no JVD Respiratory: ? accessory muscle, coarse crepitations bilat, POst-lat oin L side decrease dAE  Abdomen: positive BS, diffuse mil tenderness with palpation, no guarding, no distension   Musculoskeletal: trace edema bilaterally, no cyanosis, no clubbing   Data Reviewed: Basic Metabolic Panel:  Recent Labs Lab 11/10/15 0320 11/11/15 0500 11/12/15 0408 11/13/15 0841 11/14/15 0405 11/15/15 0800 11/16/15 0419   NA 124* 126* 122* 124* 126* 126* 129*  K 4.7 5.2* 5.2* 5.1 4.9 5.5* 5.6*  CL 92* 93* 90* 92* 96* 96* 99*  CO2 22 21* 20* 19* 22 18* 19*  GLUCOSE 218* 170* 151* 89 81 81 169*  BUN 19 22* 27* 29* 27* 36* 44*  CREATININE 1.47* 1.62* 1.73* 1.80* 1.74* 2.08* 2.53*  CALCIUM 7.7* 8.2* 8.0* 8.3* 8.2* 8.3* 8.0*  MG 1.8 1.9  --   --   --   --   --   PHOS 3.7 4.1 4.8*  --   --   --   --    Liver Function Tests:  Recent Labs Lab 11/12/15 0408 11/16/15 0419  AST  --  36  ALT  --  14  ALKPHOS  --  154*  BILITOT  --  0.4  PROT  --  5.3*  ALBUMIN 2.3* 2.1*   CBC:  Recent Labs Lab 11/10/15 0320 11/11/15 0500 11/12/15 0408 11/14/15 1000 11/16/15 0419  WBC 6.1 7.3 7.6 10.5 7.6  NEUTROABS 6.0 7.1 7.5 10.3* 7.5  HGB 8.6* 8.5* 8.1* 8.5* 7.7*  HCT 25.2* 25.1* 23.8* 25.2* 22.6*  MCV 83.2 83.7 82.6 84.6 83.7  PLT 231 287 295 233 133*  BNP (last 3 results)  Recent Labs  10/31/15 1516 11/03/15 1430  BNP 299.7* 143.2*   CBG:  Recent Labs Lab 11/15/15 0828 11/15/15 1301 11/15/15 1624 11/15/15 2200 11/16/15 0858  GLUCAP 111* 115* 203* 191* 150*      Studies: Dg Chest 2 View  Result Date: 11/14/2015 CLINICAL DATA:  Pneumonia.  Shortness of breath.  Fever. EXAM: CHEST  2 VIEW COMPARISON:  11/11/2015 chest radiograph. FINDINGS: Right PICC terminates at the cavoatrial junction. Stable cardiomediastinal silhouette with normal heart size. No pneumothorax. No pleural effusion. Extensive patchy opacities throughout both lungs have not appreciably changed. IMPRESSION: Extensive patchy opacities throughout both lungs have not appreciably changed, most suggestive of multilobar pneumonia. Follow-up post treatment chest imaging to document resolution of these opacities is necessary to exclude other etiologies. Electronically Signed   By: Ilona Sorrel M.D.   On: 11/14/2015 11:15   Dg Chest Port 1 View  Result Date: 11/15/2015 CLINICAL DATA:  Pneumonia EXAM: PORTABLE CHEST 1 VIEW  COMPARISON:  Chest radiograph from one day prior. FINDINGS: Right PICC terminates in the lower third of the superior vena cava. Stable cardiomediastinal silhouette with normal heart size. No pneumothorax. No pleural effusion. Extensive patchy lung opacities throughout both lungs have not appreciably changed. IMPRESSION: No appreciable change in extensive patchy lung opacities throughout both lungs most suggestive of multilobar pneumonia. Follow-up chest imaging to resolution. Electronically Signed   By: Ilona Sorrel M.D.   On: 11/15/2015 09:35    Scheduled Meds: . ampicillin-sulbactam (UNASYN) IV  3 g Intravenous Q6H  . azithromycin  1,200 mg Oral Weekly  . docusate sodium  200 mg Oral BID  . dolutegravir  50 mg Oral Daily  . emtricitabine-tenofovir AF  1 tablet Oral Daily  . enoxaparin (LOVENOX) injection  40 mg Subcutaneous Q24H  . furosemide  40 mg Intravenous BID  . insulin aspart  0-5 Units Subcutaneous QHS  . insulin aspart  0-9 Units Subcutaneous TID WC  . ipratropium-albuterol  3 mL Nebulization TID  . methylPREDNISolone (SOLU-MEDROL) injection  60 mg Intravenous Q12H  . metoprolol tartrate  12.5 mg Oral BID  . pantoprazole  40 mg Oral Daily  . saccharomyces boulardii  250 mg Oral BID  . sodium chloride flush  10-40 mL Intracatheter Q12H  . sodium chloride flush  3 mL Intravenous Q12H  . sodium chloride  1 g Oral BID WC  . sulfamethoxazole-trimethoprim  2 tablet Oral TID      Time spent:30 minutes     Verneita Griffes, MD [facility] (P) [phone]

## 2015-11-16 NOTE — Progress Notes (Signed)
Physical Therapy Treatment Patient Details Name: Nancy Acevedo MRN: [id number] DOB: [birth year] Today's Date: 11/16/2015    History of Present Illness 55 y.o.femalewith medical history significant for hypertension, chronic kidney disease stage II, OSA, GERD, and recent diagnosis of Crohn's colitis managed with prednisone who presentedto the [place] emergency department with 2-3 days of fevers, nausea, vomiting, and nonbloody diarrhea. Patient reports the insidious development of abdominal pain approximately 6 months ago and underwent a colonoscopy in July of this year with a large ulcer at the terminal ileum and several punctate deep ulcers in the right colon, with pathology consistent with Crohn's. She has been taking daily prednisone since that time, but unfortunately developed severe mid abdominal pain which rapidly progressed to include fevers, nausea, nonbloody vomiting, and nonbloody diarrhea. She describes her abdominal pain as severe, cramping in character, localized to the lower abdomen, and associated with loss of appetite. She denies any chest pain, palpitations, dyspnea, lower extremity edema, rash, or wound.On 10/20 her resp status decompensated and patient was moved to ICU. Work up demonstrated bilateral infiltrates and ground glass opacity. 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 Patient is now treated with bactrim IV and steroids, ID is on board. Patient resp status stabilizes with the use of BIPAP and didn't require intubation. On 10/23 was move to stepdown and back to [facility] service.     PT Comments    Pt admitted with above diagnosis. Pt currently with functional limitations due to the deficits listed below (see PT Problem List). Pt was unable to perform mobility due to resp issues.  Reviewed x1 exercises for vertigo.  Pt doing ok with exercises.  Assisted pt into better position in bed as well.  Will continue PT.   Pt will benefit from skilled PT  to increase their independence and safety with mobility to allow discharge to the venue listed below.    Follow Up Recommendations  SNF;Supervision/Assistance - 24 hour (vestibular rehab)     Equipment Recommendations  Other (comment) (TBA)    Recommendations for Other Services OT consult     Precautions / Restrictions Precautions Precautions: Fall Restrictions Weight Bearing Restrictions: No    Mobility  Bed Mobility Overal bed mobility: Needs Assistance;+2 for physical assistance             General bed mobility comments: On arrival, RT changing pt from bipap to HFNC 15 liters.  Pt quickly desat to 78% therefore RT placed pt back on 80% with sats to 96-100%.  RT moved bipapa to 60% while MD in room and sats 92-96%.  Spoke with MD regarding mobility limited due to resp status and MD agrees.  Pt looked uncomfortable in bed therefore MD and PT moved pt with Total assist of 2 with pad to scoot pt up in bed with pt not able to assist at all.  Transfers                    Ambulation/Gait                 Stairs            Wheelchair Mobility    Modified Rankin (Stroke Patients Only)       Balance                                    Cognition Arousal/Alertness: Awake/alert Behavior During Therapy:  Flat affect Overall Cognitive Status: Within Functional Limits for tasks assessed                      Exercises Other Exercises Other Exercises: x1 exercises    General Comments General comments (skin integrity, edema, etc.): Reviewed x1 exercises with pt and pt demonstrated she has been doing them and they are helping.  Pt performed 2 x side to side while PT in room.  She states she will continue to practice.       Pertinent Vitals/Pain Pain Assessment: No/denies pain  Sats >95% on 80% bipap.  Desat with RT trying HFNC down to 78% on 15 L.      Home Living                      Prior Function            PT  Goals (current goals can now be found in the care plan section) Acute Rehab PT Goals Patient Stated Goal: to get better Progress towards PT goals: Not progressing toward goals - comment (respiratory issues)    Frequency    Min 3X/week      PT Plan Current plan remains appropriate    Co-evaluation             End of Session Equipment Utilized During Treatment: Gait belt;Oxygen (on 60-80% bipap with RT in room) Activity Tolerance: Patient limited by fatigue (limited by resp status) Patient left: in bed;with call bell/phone within reach;with bed alarm set;with nursing/sitter in room     Time: 0931-1002 PT Time Calculation (min) (ACUTE ONLY): 31 min  Charges:  $Therapeutic Exercise: 8-22 mins $Therapeutic Activity: 8-22 mins                    G CodesIrwin Brakeman F 11/25/2015, 11:09 AM [facility] [phone] (pager)

## 2015-11-16 NOTE — Progress Notes (Signed)
[facility] for Infectious Disease    Date of Admission:  11/16/2015   Total days of antibiotics 17        Day 12 bactrim        Day 9 of ART   ID: Nancy Acevedo is a 55 y.o. 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[place]) Active Problems:   Controlled type 2 diabetes mellitus with stage 2 chronic kidney disease, without long-term current use of insulin (HCC)   OSA (obstructive sleep apnea)   Hypokalemia   Gastroesophageal reflux disease with esophagitis   Crohn's disease of both small and large intestine with fistula ([place])   Essential hypertension, benign   Normocytic anemia   Thrombocytopenia (HCC)   CKD (chronic kidney disease), stage II   Sepsis, unspecified organism ([place])   Hypertensive urgency   Crohn's disease of large intestine with fistula ([place])   Lobar pneumonia ([place])   Acute hypoxemic respiratory failure (HCC)   Pressure injury of skin   AIDS ([place])   Hypoxia   Acute renal failure superimposed on stage 2 chronic kidney disease ([place])   Acute pulmonary edema (HCC)   Hyperkalemia    Subjective: Increasing work of breathing since stopping bipap, now requiring to be placed back on bipap, fever of 101F on Saturday morning. This morning cxr still shows patchy bilateral disease, slightly improved in upper lung fields  Medications:  . ampicillin-sulbactam (UNASYN) IV  3 g Intravenous Q12H  . azithromycin  1,200 mg Oral Weekly  . docusate sodium  200 mg Oral BID  . enoxaparin (LOVENOX) injection  40 mg Subcutaneous Q24H  . furosemide  40 mg Intravenous BID  . insulin aspart  0-5 Units Subcutaneous QHS  . insulin aspart  0-9 Units Subcutaneous TID WC  . ipratropium-albuterol  3 mL Nebulization TID  . methylPREDNISolone (SOLU-MEDROL) injection  60 mg Intravenous Q12H  . metoprolol tartrate  12.5 mg Oral BID    . pantoprazole  40 mg Oral Daily  . saccharomyces boulardii  250 mg Oral BID  . sodium chloride flush  10-40 mL Intracatheter Q12H  . sodium chloride flush  3 mL Intravenous Q12H  . sodium chloride  1 g Oral BID WC  . sulfamethoxazole-trimethoprim  160 mg Intravenous Q8H    Objective: Vital signs in last 24 hours: Temp:  [97.1 F (36.2 C)-99 F (37.2 C)] 98.5 F (36.9 C) (10/30 1416) Pulse Rate:  [96-124] 124 (10/30 1416) Resp:  [24-30] 30 (10/30 1416) BP: (117-131)/(78-83) 131/83 (10/30 1416) SpO2:  [95 %-100 %] 95 % (10/30 1416) FiO2 (%):  [80 %-100 %] 100 % (10/30 1331) Weight:  [175 lb 4.8 oz (79.5 kg)-175 lb 14.4 oz (79.8 kg)] 175 lb 14.4 oz (79.8 kg) (10/30 0757)  Physical Exam  Constitutional:  oriented to person, place, and time. appears well-developed and well-nourished. No distress.  HENT: [place]/AT, PERRLA, no scleral icterus Mouth/Throat: Oropharynx is clear and moist. No oropharyngeal exudate.  Cardiovascular: Normal rate, regular rhythm and normal heart sounds. Exam reveals no gallop and no friction rub.  No murmur heard.  Pulmonary/Chest: Effort normal and breath sounds normal. Acc muscle use. Decrease BS at bases  Neck = supple, no nuchal rigidity Abdominal: Soft. Bowel sounds are normal.  exhibits no distension. There is no tenderness.  Lymphadenopathy: no cervical adenopathy. No axillary adenopathy Neurological: alert and oriented to person, place,  and time.  Skin: Skin is warm and dry. No rash noted. No erythema.  Psychiatric: a normal mood and affect.  behavior is normal.   Lab Results  Recent Labs  11/14/15 1000 11/15/15 0800 11/16/15 0419  WBC 10.5  --  7.6  HGB 8.5*  --  7.7*  HCT 25.2*  --  22.6*  NA  --  126* 129*  K  --  5.5* 5.6*  CL  --  96* 99*  CO2  --  18* 19*  BUN  --  36* 44*  CREATININE  --  2.08* 2.53*   Liver Panel  Recent Labs  11/16/15 0419  PROT 5.3*  ALBUMIN 2.1*  AST 36  ALT 14  ALKPHOS 154*  BILITOT 0.4     Microbiology: 10/28 blood cx NGTD Studies/Results: Dg Chest Port 1 View  Result Date: 11/16/2015 CLINICAL DATA:  Hypoxia EXAM: PORTABLE CHEST 1 VIEW COMPARISON:  11/15/2015 FINDINGS: Cardiomediastinal silhouette is stable. Right PICC line is unchanged in position. Persistent patchy airspace disease bilateral probable improving pneumonia. Mild improvement in aeration in left upper lobe. IMPRESSION: Persistent patchy airspace disease bilateral probable improving pneumonia. Mild improvement in aeration in left upper lobe. Electronically Signed   By: Lahoma Crocker M.D.   On: 11/16/2015 10:17   Dg Chest Port 1 View  Result Date: 11/15/2015 CLINICAL DATA:  Pneumonia EXAM: PORTABLE CHEST 1 VIEW COMPARISON:  Chest radiograph from one day prior. FINDINGS: Right PICC terminates in the lower third of the superior vena cava. Stable cardiomediastinal silhouette with normal heart size. No pneumothorax. No pleural effusion. Extensive patchy lung opacities throughout both lungs have not appreciably changed. IMPRESSION: No appreciable change in extensive patchy lung opacities throughout both lungs most suggestive of multilobar pneumonia. Follow-up chest imaging to resolution. Electronically Signed   By: Ilona Sorrel M.D.   On: 11/15/2015 09:35     Assessment/Plan: Respiratory distress = due to concern for aspiration pna, amp/sub added over the weekend, though her CXR is still suggestive more of PCP process. She was started on ART roughly 10 days ago, wonder if her worsening is maybe due to IRIS though this would be early. Have PCCM evaluate patient if she meets criteria for intubation. Continue with bipap for now, consider getting repeat ABG  Presumed pcp = will switch to IV bactrim (renally dose at the moment) and continue on steroids - she is on methylpred 60 q 12. May consider switching to clinda/primaquine if kidney function continues to worsen  ckd 4 = she has been trending up from her baseline of 1/5 up  to 2.5 thought to be due to diuretics. Recommend to decrease by 1/2 to see that her cr improves. Will hold her ART in anticipation that she maybe intubated. We will follow up with [facility] to see if VL and susceptibitilies are back. We will need to renally dose her hiv meds if she continues to worsen.  Hiv/aids = has been on tivicay/descovy. Will check viral load with the thought this maybe early [place], [facility] for Infectious Diseases Cell: [phone] Pager: [phone]  11/16/2015, 2:51 PM

## 2015-11-16 NOTE — Progress Notes (Signed)
[facility] started Nancy Acevedo for SNF stay this am- needs updated PT note.  Per RN pt no longer stable for DC due to increased respiratory needs.  CSW will continue to follow for eventual SNF placement  Jorge Ny, [place] Social Worker [phone]

## 2015-11-16 NOTE — Progress Notes (Signed)
Pharmacy Antibiotic Note  Nancy Acevedo is a 55 y.o. female admitted on 11/14/2015 with presumed PCP pneumonia..  Pharmacy has been consulted for bactrim and unasyn dosing.  Nancy Acevedo is on day #11 of bactrim treatment for suspected PCP pneumonia. She was recently changed to PO but has been requiring BiPAP therefore will change back to IV. There is also a concern for aspiration pneumonia and Unasyn was added on 10/29.  Her renal function has been increasing over the last few days 1.74>2.08>2.53 (baseline ~1.2). Due to worsening renal function will adjust doses of her antibiotics and hold her ART at this time per discussion with Dr. Baxter Flattery.   Plan: Decrease Bactrim to 160 mg TMP IV q8h Decrease Unasyn to 3g IV q12h Holding Tivicay/Descovy Monitor renal function, oxygen requirements and ability to resume ART When renal function is more stable consider resuming a crushable regimen for ART since patient may require intubation (Consider using Triumeq components - dolutegravir can be crushed, both abacavir and lamivudine are available as liquids)  Height: '5\' 4"'$  (162.6 cm) Weight: 175 lb 14.4 oz (79.8 kg) IBW/kg (Calculated) : 54.7  Temp (24hrs), Avg:98.3 F (36.8 C), Min:97.1 F (36.2 C), Max:99.8 F (37.7 C)   Recent Labs Lab 11/10/15 0320 11/11/15 0500 11/12/15 0408 11/13/15 0841 11/14/15 0405 11/14/15 1000 11/15/15 0800 11/16/15 0419  WBC 6.1 7.3 7.6  --   --  10.5  --  7.6  CREATININE 1.47* 1.62* 1.73* 1.80* 1.74*  --  2.08* 2.53*    Estimated Creatinine Clearance: 26.3 mL/min (by C-G formula based on SCr of 2.53 mg/dL (H)).    Allergies  Allergen Reactions  . Lexapro [Escitalopram] Itching  . Losartan   . Spironolactone     rash  . Clonidine Derivatives Palpitations     Antimicrobials this admission:  Vanco 10/17 x1 (750 mg); 10/19 x 1 (1g) Zosyn 10/17 >>10/22 Cipro 10/13 >> 10/16 Flagyl 10/13 >> 10/16 Bactrim 10/20 (switched to po on 10/26; changed back  to IV 10/30) >> Azithromycin 10/26 weekly >>  Microbiology results:  10/14 Cdiff >> negative 10/16 Quantiferon Gold >> indeterminate so PPD placed 10/17 - read as negative 10/17 BCx: neg 10/17 MRSA PCR: neg 10/19 BCx2: neg 10/19 Cdif: neg x2 10/20 HIV: pos 10/21 HIV RNA: ip 10/21 CD4: <10 10/21 HLA: neg 10/21 Resp PCR: neg 10/28 BCx: ngtd  Thank you for allowing pharmacy to be a part of this patient's care.  Dimitri Ped, PharmD. PGY-2 Infectious Diseases Pharmacy Resident Pager: [phone] 11/16/2015 11:50 AM

## 2015-11-16 NOTE — Progress Notes (Signed)
Tried PT on 15L [place].  Immediately desatted to 79%.  Placed PT back on BiPAP, lowered Fi02 to 60% per MD in room, maintaining stat around 97% will continue to monitor.  RN and MD in room aware.

## 2015-11-17 ENCOUNTER — Inpatient Hospital Stay (HOSPITAL_COMMUNITY): Payer: BC Managed Care – PPO

## 2015-11-17 DIAGNOSIS — Z9911 Dependence on respirator [ventilator] status: Secondary | ICD-10-CM

## 2015-11-17 DIAGNOSIS — N184 Chronic kidney disease, stage 4 (severe): Secondary | ICD-10-CM

## 2015-11-17 DIAGNOSIS — M79606 Pain in leg, unspecified: Secondary | ICD-10-CM

## 2015-11-17 LAB — CBC WITH DIFFERENTIAL/PLATELET
BASOS ABS: 0 10*3/uL (ref 0.0–0.1)
BASOS PCT: 0 %
EOS ABS: 0 10*3/uL (ref 0.0–0.7)
Eosinophils Relative: 0 %
HCT: 23 % — ABNORMAL LOW (ref 36.0–46.0)
Hemoglobin: 7.8 g/dL — ABNORMAL LOW (ref 12.0–15.0)
Lymphocytes Relative: 1 %
Lymphs Abs: 0.1 10*3/uL — ABNORMAL LOW (ref 0.7–4.0)
MCH: 28.6 pg (ref 26.0–34.0)
MCHC: 33.9 g/dL (ref 30.0–36.0)
MCV: 84.2 fL (ref 78.0–100.0)
MONO ABS: 0.1 10*3/uL (ref 0.1–1.0)
Monocytes Relative: 1 %
NEUTROS PCT: 98 %
Neutro Abs: 8.4 10*3/uL — ABNORMAL HIGH (ref 1.7–7.7)
Platelets: 100 10*3/uL — ABNORMAL LOW (ref 150–400)
RBC: 2.73 MIL/uL — AB (ref 3.87–5.11)
RDW: 16.5 % — AB (ref 11.5–15.5)
WBC: 8.6 10*3/uL (ref 4.0–10.5)

## 2015-11-17 LAB — GLUCOSE, CAPILLARY
GLUCOSE-CAPILLARY: 223 mg/dL — AB (ref 65–99)
Glucose-Capillary: 190 mg/dL — ABNORMAL HIGH (ref 65–99)
Glucose-Capillary: 162 mg/dL — ABNORMAL HIGH (ref 65–99)
Glucose-Capillary: 149 mg/dL — ABNORMAL HIGH (ref 65–99)
Glucose-Capillary: 188 mg/dL — ABNORMAL HIGH (ref 65–99)
Glucose-Capillary: 190 mg/dL — ABNORMAL HIGH (ref 65–99)

## 2015-11-17 LAB — BLOOD GAS, ARTERIAL
Acid-base deficit: 6.2 mmol/L — ABNORMAL HIGH (ref 0.0–2.0)
Bicarbonate: 18 mmol/L — ABNORMAL LOW (ref 20.0–28.0)
Drawn by: 46791
EXPIRATORY PAP: 6
FIO2: 100
INSPIRATORY PAP: 12
O2 Saturation: 96.2 %
PCO2 ART: 31.8 mmHg — AB (ref 32.0–48.0)
PH ART: 7.372 (ref 7.350–7.450)
Patient temperature: 98.6
pO2, Arterial: 104 mmHg (ref 83.0–108.0)

## 2015-11-17 LAB — POCT I-STAT 3, ART BLOOD GAS (G3+)
ACID-BASE DEFICIT: 7 mmol/L — AB (ref 0.0–2.0)
BICARBONATE: 19.9 mmol/L — AB (ref 20.0–28.0)
O2 Saturation: 98 %
TCO2: 21 mmol/L (ref 0–100)
pCO2 arterial: 45.9 mmHg (ref 32.0–48.0)
pH, Arterial: 7.245 — ABNORMAL LOW (ref 7.350–7.450)
pO2, Arterial: 126 mmHg — ABNORMAL HIGH (ref 83.0–108.0)

## 2015-11-17 LAB — BODY FLUID CELL COUNT WITH DIFFERENTIAL
Lymphs, Fluid: 6 %
Neutrophil Count, Fluid: 94 % — ABNORMAL HIGH (ref 0–25)
Total Nucleated Cell Count, Fluid: 69 cu mm (ref 0–1000)

## 2015-11-17 LAB — COMPREHENSIVE METABOLIC PANEL
ALBUMIN: 2.2 g/dL — AB (ref 3.5–5.0)
ALK PHOS: 162 U/L — AB (ref 38–126)
ALT: 13 U/L — ABNORMAL LOW (ref 14–54)
AST: 29 U/L (ref 15–41)
Anion gap: 11 (ref 5–15)
BILIRUBIN TOTAL: 0.4 mg/dL (ref 0.3–1.2)
BUN: 47 mg/dL — AB (ref 6–20)
CALCIUM: 8.2 mg/dL — AB (ref 8.9–10.3)
CO2: 20 mmol/L — ABNORMAL LOW (ref 22–32)
Chloride: 97 mmol/L — ABNORMAL LOW (ref 101–111)
Creatinine, Ser: 2.36 mg/dL — ABNORMAL HIGH (ref 0.44–1.00)
GFR calc Af Amer: 26 mL/min — ABNORMAL LOW (ref >60)
GFR, EST NON AFRICAN AMERICAN: 22 mL/min — AB (ref >60)
GLUCOSE: 179 mg/dL — AB (ref 65–99)
POTASSIUM: 5.2 mmol/L — AB (ref 3.5–5.1)
Sodium: 128 mmol/L — ABNORMAL LOW (ref 135–145)
TOTAL PROTEIN: 5.3 g/dL — AB (ref 6.5–8.1)

## 2015-11-17 LAB — TRIGLYCERIDES: TRIGLYCERIDES: 327 mg/dL — AB (ref <150)

## 2015-11-17 LAB — HIV-1 RNA QUANT-NO REFLEX-BLD
HIV 1 RNA QUANT: 780 {copies}/mL
LOG10 HIV-1 RNA: 2.892 {Log_copies}/mL

## 2015-11-17 MED ORDER — PROPOFOL 1000 MG/100ML IV EMUL
0.0000 ug/kg/min | INTRAVENOUS | Status: DC
  Administered 2015-11-17: 25 ug/kg/min via INTRAVENOUS
  Administered 2015-11-17: 20 ug/kg/min via INTRAVENOUS
  Administered 2015-11-18: 30 ug/kg/min via INTRAVENOUS
  Administered 2015-11-18: 25 ug/kg/min via INTRAVENOUS
  Administered 2015-11-18 – 2015-11-19 (×2): 15 ug/kg/min via INTRAVENOUS
  Administered 2015-11-19: 10 ug/kg/min via INTRAVENOUS
  Administered 2015-11-20: 15 ug/kg/min via INTRAVENOUS
  Filled 2015-11-17 (×8): qty 100

## 2015-11-17 MED ORDER — FENTANYL CITRATE (PF) 100 MCG/2ML IJ SOLN
100.0000 ug | Freq: Once | INTRAMUSCULAR | Status: AC
Start: 2015-11-17 — End: 2015-11-17

## 2015-11-17 MED ORDER — FENTANYL 2500MCG IN NS 250ML (10MCG/ML) PREMIX INFUSION
25.0000 ug/h | INTRAVENOUS | Status: DC
  Administered 2015-11-17: 50 ug/h via INTRAVENOUS
  Administered 2015-11-18: 200 ug/h via INTRAVENOUS
  Administered 2015-11-18: 100 ug/h via INTRAVENOUS
  Administered 2015-11-19: 200 ug/h via INTRAVENOUS
  Filled 2015-11-17 (×4): qty 250

## 2015-11-17 MED ORDER — MIDAZOLAM HCL 2 MG/2ML IJ SOLN
INTRAMUSCULAR | Status: AC
Start: 2015-11-17 — End: 2015-11-17
  Filled 2015-11-17: qty 2

## 2015-11-17 MED ORDER — MIDAZOLAM HCL 2 MG/2ML IJ SOLN
2.0000 mg | Freq: Once | INTRAMUSCULAR | Status: AC
  Administered 2015-11-17: 2 mg via INTRAVENOUS

## 2015-11-17 MED ORDER — ENOXAPARIN SODIUM 30 MG/0.3ML ~~LOC~~ SOLN
30.0000 mg | SUBCUTANEOUS | Status: DC
  Administered 2015-11-18 – 2015-11-22 (×5): 30 mg via SUBCUTANEOUS
  Filled 2015-11-17 (×5): qty 0.3

## 2015-11-17 MED ORDER — INSULIN ASPART 100 UNIT/ML ~~LOC~~ SOLN
2.0000 [IU] | SUBCUTANEOUS | Status: DC
  Administered 2015-11-18: 6 [IU] via SUBCUTANEOUS
  Administered 2015-11-18 (×2): 4 [IU] via SUBCUTANEOUS
  Administered 2015-11-18: 2 [IU] via SUBCUTANEOUS
  Administered 2015-11-18 – 2015-11-19 (×7): 4 [IU] via SUBCUTANEOUS

## 2015-11-17 MED ORDER — ETOMIDATE 2 MG/ML IV SOLN
INTRAVENOUS | Status: AC
  Filled 2015-11-17: qty 20

## 2015-11-17 MED ORDER — FENTANYL CITRATE (PF) 100 MCG/2ML IJ SOLN
INTRAMUSCULAR | Status: AC
Start: 2015-11-17 — End: 2015-11-17
  Administered 2015-11-17: 100 ug
  Filled 2015-11-17: qty 2

## 2015-11-17 MED ORDER — CHLORHEXIDINE GLUCONATE 0.12% ORAL RINSE (MEDLINE KIT)
15.0000 mL | Freq: Two times a day (BID) | OROMUCOSAL | Status: DC
  Administered 2015-11-17 – 2015-11-27 (×20): 15 mL via OROMUCOSAL

## 2015-11-17 MED ORDER — FENTANYL BOLUS VIA INFUSION
50.0000 ug | INTRAVENOUS | Status: DC | PRN
  Administered 2015-11-20: 50 ug via INTRAVENOUS
  Filled 2015-11-17: qty 50

## 2015-11-17 MED ORDER — ORAL CARE MOUTH RINSE
15.0000 mL | Freq: Four times a day (QID) | OROMUCOSAL | Status: DC
  Administered 2015-11-17 – 2015-11-27 (×39): 15 mL via OROMUCOSAL

## 2015-11-17 MED ORDER — FENTANYL CITRATE (PF) 100 MCG/2ML IJ SOLN
50.0000 ug | Freq: Once | INTRAMUSCULAR | Status: AC
  Administered 2015-11-17: 50 ug via INTRAVENOUS

## 2015-11-17 MED ORDER — ROCURONIUM BROMIDE 50 MG/5ML IV SOLN
60.0000 mg | Freq: Once | INTRAVENOUS | Status: AC
  Administered 2015-11-17: 60 mg via INTRAVENOUS
  Filled 2015-11-17: qty 6

## 2015-11-17 MED ORDER — ETOMIDATE 2 MG/ML IV SOLN
20.0000 mg | Freq: Once | INTRAVENOUS | Status: AC
  Administered 2015-11-17: 20 mg via INTRAVENOUS

## 2015-11-17 NOTE — Procedures (Signed)
Intubation Procedure Note Nancy Acevedo [id number] [birth year]  Procedure: Intubation Indications: Respiratory insufficiency  Procedure Details Consent: Risks of procedure as well as the alternatives and risks of each were explained to the (patient/caregiver).  Consent for procedure obtained. Time Out: Verified patient identification, verified procedure, site/side was marked, verified correct patient position, special equipment/implants available, medications/allergies/relevent history reviewed, required imaging and test results available.  Performed  Sabra Heck and 3   Evaluation Hemodynamic Status: BP stable throughout; O2 sats: stable throughout Patient's Current Condition: stable Complications: No apparent complications Patient did tolerate procedure well. Chest X-ray ordered to verify placement.  CXR: pending.   Nancy Acevedo 11/17/2015

## 2015-11-17 NOTE — Progress Notes (Signed)
RT Note- RR set at 18, ABG obtained and called to Dr. Alva Garnet, RR increased to 26.

## 2015-11-17 NOTE — Progress Notes (Signed)
*  Preliminary Results* Bilateral lower extremity venous duplex completed. Bilateral lower extremities are negative for deep vein thrombosis. There is evidence of right Baker's cyst, no evidence of left Baker's cyst.  11/17/2015 3:31 PM Maudry Mayhew, BS, RVT, RDCS, RDMS

## 2015-11-17 NOTE — Progress Notes (Signed)
Received patient from 4E patient is alert and oriented on Bipap Elink notified of patient's arrival and placed on monitor.ST 126 rr 30 with sat 100 . Family called by 4 E RN without answer will call againand inform of patient's transfer to ICU and potential intubationper patient's request.

## 2015-11-17 NOTE — Progress Notes (Signed)
Attempted to place PT on HFNC.  Sats immediately dropped into the mid 80's.  Attempted for several minutes to see if PT would level out.  Was unable to maintain a satisfactory sat, PT placed back on BiPAP.

## 2015-11-17 NOTE — Procedures (Signed)
PCCM Bronchoscopy Procedure Note  The patient was informed of the risks (including but not limited to bleeding, infection, respiratory failure, lung injury, tooth/oral injury) and benefits of the procedure and gave consent, see chart.  Indication: HIV, AIDS with acute resp failure, b/l infiltrates  Post Procedure Diagnosis: Same + Diffuse alveolar hemorrhage  Condition pre procedure: Stable  Medications for procedure: Propofol drip  Procedure description: The bronchoscope was introduced through the endotracheal tube and passed to the bilateral lungs to the level of the subsegmental bronchi throughout the tracheobronchial tree.  Airway exam revealed normal airway anatomy with no secretions.  Procedures performed: BAL in right middle lobe with 60cc X 3 aliquots. The return was increasingly bloody indicating diffuse alveolar hemorrhage.      Specimens sent: Cell count, cytology, PCP stain, cultures  Condition post procedure: Stable  EBL: 0  Complications: None.  Marshell Garfinkel MD [facility] Pager [phone] If no answer or after 3pm call: [phone] 11/17/2015, 4:35 PM

## 2015-11-17 NOTE — Progress Notes (Signed)
RT note- patient intubated then a bronchoscopy performed by MD at bedside.

## 2015-11-17 NOTE — Progress Notes (Signed)
[facility] for Infectious Disease    Date of Admission:  11/07/2015   Total days of antibiotics 18        Day 13 bactrim        Day 9 of ART- now held   ID: Lael Wetherbee is a 55 y.o. 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[place]) Active Problems:   Controlled type 2 diabetes mellitus with stage 2 chronic kidney disease, without long-term current use of insulin (HCC)   OSA (obstructive sleep apnea)   Hypokalemia   Gastroesophageal reflux disease with esophagitis   Crohn's disease of both small and large intestine with fistula ([place])   Essential hypertension, benign   Normocytic anemia   Thrombocytopenia (HCC)   CKD (chronic kidney disease), stage II   Sepsis, unspecified organism ([place])   Hypertensive urgency   Crohn's disease of large intestine with fistula ([place])   Lobar pneumonia ([place])   Acute hypoxemic respiratory failure ([place])   Pressure injury of skin   AIDS ([place])   Hypoxia   Acute renal failure superimposed on stage 2 chronic kidney disease ([place])   Acute pulmonary edema (HCC)   Hyperkalemia    Subjective: Wearing bipap this morning. Still has tachypnea  Medications:  . ampicillin-sulbactam (UNASYN) IV  3 g Intravenous Q12H  . azithromycin  1,200 mg Oral Weekly  . chlorhexidine gluconate (MEDLINE KIT)  15 mL Mouth Rinse BID  . docusate sodium  200 mg Oral BID  . [START ON 11/18/2015] enoxaparin (LOVENOX) injection  30 mg Subcutaneous Q24H  . furosemide  40 mg Intravenous BID  . insulin aspart  0-5 Units Subcutaneous QHS  . insulin aspart  0-9 Units Subcutaneous TID WC  . ipratropium-albuterol  3 mL Nebulization TID  . mouth rinse  15 mL Mouth Rinse QID  . methylPREDNISolone (SOLU-MEDROL) injection  60 mg Intravenous Q12H  . metoprolol tartrate  12.5 mg Oral BID  . pantoprazole  40 mg Oral Daily  .  saccharomyces boulardii  250 mg Oral BID  . sodium chloride flush  10-40 mL Intracatheter Q12H  . sodium chloride flush  3 mL Intravenous Q12H  . sodium chloride  1 g Oral BID WC  . sulfamethoxazole-trimethoprim  160 mg Intravenous Q8H    Objective: Vital signs in last 24 hours: Temp:  [98 F (36.7 C)-99.1 F (37.3 C)] 98.1 F (36.7 C) (10/31 1540) Pulse Rate:  [29-133] 124 (10/31 1645) Resp:  [18-30] 18 (10/31 1645) BP: (121-193)/(79-100) 131/85 (10/31 1645) SpO2:  [91 %-100 %] 100 % (10/31 1645) FiO2 (%):  [100 %] 100 % (10/31 1615) Weight:  [176 lb 11.2 oz (80.2 kg)] 176 lb 11.2 oz (80.2 kg) (10/31 0407)  Physical Exam  Constitutional:  oriented to person, place, and time. appears well-developed and well-nourished. Wearing bipap  HENT: [place]/AT, PERRLA, no scleral icterus Mouth/Throat: Oropharynx is clear and moist. No oropharyngeal exudate.  Cardiovascular: Normal rate, regular rhythm and normal heart sounds. Exam reveals no gallop and no friction rub.  No murmur heard.  Pulmonary/Chest: Effort normal and breath sounds normal. Acc muscle use. Decrease BS at bases  Neck = supple, no nuchal rigidity Abdominal: Soft. Bowel sounds are normal.  exhibits no distension. There is no tenderness.  Lymphadenopathy: no cervical adenopathy. No axillary adenopathy Neurological: alert and oriented to person, place, and time.  Skin: Skin is warm and  dry. No rash noted. No erythema.  Psychiatric: a normal mood and affect.  behavior is normal.   Lab Results  Recent Labs  11/16/15 0419 11/17/15 0813  WBC 7.6 8.6  HGB 7.7* 7.8*  HCT 22.6* 23.0*  NA 129* 128*  K 5.6* 5.2*  CL 99* 97*  CO2 19* 20*  BUN 44* 47*  CREATININE 2.53* 2.36*   Liver Panel  Recent Labs  11/16/15 0419 11/17/15 0813  PROT 5.3* 5.3*  ALBUMIN 2.1* 2.2*  AST 36 29  ALT 14 13*  ALKPHOS 154* 162*  BILITOT 0.4 0.4    Microbiology: 10/28 blood cx NGTD Studies/Results: Dg Chest Port 1 View  Result Date:  11/17/2015 CLINICAL DATA:  Respiratory failure EXAM: PORTABLE CHEST 1 VIEW COMPARISON:  11/16/2015 FINDINGS: Diffuse bilateral opacities are again noted, similar to prior study. Heart is normal size. No effusions. No acute bony abnormality. Right PICC line remains in place, unchanged. IMPRESSION: Persistent diffuse airspace opacities likely reflecting pneumonia. No real change. Electronically Signed   By: Rolm Baptise M.D.   On: 11/17/2015 12:53   Dg Chest Port 1 View  Result Date: 11/16/2015 CLINICAL DATA:  Hypoxia EXAM: PORTABLE CHEST 1 VIEW COMPARISON:  11/15/2015 FINDINGS: Cardiomediastinal silhouette is stable. Right PICC line is unchanged in position. Persistent patchy airspace disease bilateral probable improving pneumonia. Mild improvement in aeration in left upper lobe. IMPRESSION: Persistent patchy airspace disease bilateral probable improving pneumonia. Mild improvement in aeration in left upper lobe. Electronically Signed   By: Lahoma Crocker M.D.   On: 11/16/2015 10:17     Assessment/Plan: Respiratory distress = she is currently on amp/sub plus bactrim for concern of aspiration pneumonia. She still looks like she will likely need intubation due to unable to sustain being on bipap > 36hr.   She was started on ART roughly 10 days ago, wonder if her worsening is maybe due to IRIS though this would be early. I have held her ART since she has not been taking food by mouth  Presumed pcp = continue on IV bactrim (renally dose at the moment) and continue on steroids - she is on methylpred 60 q 12. May consider switching to clinda/primaquine if kidney function continues to worsen  ckd 4 = cr at 2.36, not worsened, though still need to renally adjust medications.    Hiv/aids =Will hold her ART in anticipation that she maybe intubated. We will follow up with [facility] to see if VL and susceptibitilies are back. We will need to renally dose her hiv meds if she continues to worsen. Will check viral load  with the thought this maybe early IRIS  ------- Addendum = patient was subsequently intubated this late afternoon, her bronchoscopy appears to diffuse alveolar hemorrhage. It is not necessarily associated with PCP but wonder if due to ARDS, may need to increase steroids, but defer to pulmonology for their input  Dr Linus Salmons to see tomorrow.     Baxter Flattery [facility] for Infectious Diseases Cell: [phone] Pager: [phone]  11/17/2015, 5:36 PM

## 2015-11-17 NOTE — Progress Notes (Signed)
patient seen and evaluated this am  Stable on Bipap but desatting Labs show steady climb in creat 2.08-->2.3 NOW on 100% O2 on Bipap and seems dependant on the same Dr Vaughan Browner called me and intends to probably intubate and bronch patient-WIll need r/o IRIS probably by BAL  Appreciate input.  We'll s/o and re-assume care of patient once stabilized form reps standpoint  Verneita Griffes, MD [facility] [phone]

## 2015-11-17 NOTE — Progress Notes (Signed)
PULMONARY / CRITICAL CARE MEDICINE   Name: Nancy Acevedo MRN: [id number] DOB: 1960-08-17    ADMISSION DATE:  10/23/2015 CONSULTATION DATE:  10/20  REFERRING MD:  [facility]  CHIEF COMPLAINT: Can't breathe  History: 55 Y/O with hypertension, chronic kidney disease stage II, OSA, GERD, and recent diagnosis of Crohn's colitis managed with prednisone Admittedwith resp distress, fevers, B/L infitrates.  She was diagnosed with HIV, AIDS CD4 count <4. Presumed PCP vs bacterial PNA. Getting treatment with Abx, bactrim and steroids. Also on HAART therapy as per ID  Events 11/03/2015  -admit  10/14 - fever resolved 11/02/15 - fever back 10/17 -- MRSA Pcr negative 11/06/15- ICU tx 11/07/15 - HIV +ve from10/20/17, RVP  Negative, C Diff negative .STart bipap and lasix. ID consult -> Rx PCP (DO NOT DISCLOSE TO FAMILY). AUTOIMMUNE ORDERED 10/29- Re consult for ongoing hypoxia 10/31 Increasing oxygen demands on 100% BiPap, unable to transition to HFO2.  SUBJECTIVE/OVERNIGHT/INTERVAL HX 10/29 called back by to evaluate for worsening hypoxemic respiratory failure. Pt has been alternating between Bipap and HFNC.  10/31, today,  she is on BIPAP 100% after being bathed as she feels short of breath. She has failed several attempts at [facility], yesterday and today  desaturating into the 44 's. She indicated to RT that she would like to be intubated.She is tolerating BiPap well, however  Per gas PO2 of 104 on 100%.  VITAL SIGNS: BP 136/83   Pulse (!) 116   Temp 98.3 F (36.8 C) (Axillary)   Resp (!) 28   Ht 5\' 4"  (1.626 m)   Wt 176 lb 11.2 oz (80.2 kg)   SpO2 100%   BMI 30.33 kg/m   HEMODYNAMICS:    VENTILATOR SETTINGS: Vent Mode: PCV FiO2 (%):  [100 %] 100 % Set Rate:  [12 bmp] 12 bmp PEEP:  [6 cmH20] 6 cmH20  INTAKE / OUTPUT: I/O last 3 completed shifts: In: 2119 [P.O.:480; I.V.:59; IV Piggyback:1580] Out: [id number] [Urine:1675]  PHYSICAL EXAMINATION: General:  No resp distress on Bipap after  bath, anxious Neuro:  Intact, alert and interactive, moving all ext to command, No focal deficits HEENT:No JVD, thyromegaly Cardiovascular:  RRR, Nl S1/S2, No MRG. Lungs:  Scattered crackles, rhonchi. Abdomen:  Tender, soft +bs Musculoskeletal:  Intact, edema and tenderness. Skin: warm and dry  LABS:  PULMONARY  Recent Labs Lab 11/15/15 0915 11/16/15 1140 11/17/15 0940  PHART 7.423 7.365 7.372  PCO2ART 27.5* 30.6* 31.8*  PO2ART 56.4* 65.8* 104  HCO3 17.6* 17.1* 18.0*  O2SAT 84.1 88.9 96.2   CBC  Recent Labs Lab 11/14/15 1000 11/16/15 0419 11/17/15 0813  HGB 8.5* 7.7* 7.8*  HCT 25.2* 22.6* 23.0*  WBC 10.5 7.6 8.6  PLT 233 133* 100*   COAGULATION No results for input(s): INR in the last 168 hours.  CARDIAC  No results for input(s): TROPONINI in the last 168 hours. No results for input(s): PROBNP in the last 168 hours.  CHEMISTRY  Recent Labs Lab 11/11/15 0500 11/12/15 0408 11/13/15 0841 11/14/15 0405 11/15/15 0800 11/16/15 0419 11/17/15 0813  NA 126* 122* 124* 126* 126* 129* 128*  K 5.2* 5.2* 5.1 4.9 5.5* 5.6* 5.2*  CL 93* 90* 92* 96* 96* 99* 97*  CO2 21* 20* 19* 22 18* 19* 20*  GLUCOSE 170* 151* 89 81 81 169* 179*  BUN 22* 27* 29* 27* 36* 44* 47*  CREATININE 1.62* 1.73* 1.80* 1.74* 2.08* 2.53* 2.36*  CALCIUM 8.2* 8.0* 8.3* 8.2* 8.3* 8.0* 8.2*  MG 1.9  --   --   --   --   --   --  PHOS 4.1 4.8*  --   --   --   --   --    Estimated Creatinine Clearance: 28.2 mL/min (by C-G formula based on SCr of 2.36 mg/dL (H)).  LIVER  Recent Labs Lab 11/12/15 0408 11/16/15 0419 11/17/15 0813  AST  --  36 29  ALT  --  14 13*  ALKPHOS  --  154* 162*  BILITOT  --  0.4 0.4  PROT  --  5.3* 5.3*  ALBUMIN 2.3* 2.1* 2.2*   INFECTIOUS No results for input(s): LATICACIDVEN, PROCALCITON in the last 168 hours.  ENDOCRINE CBG (last 3)   Recent Labs  11/16/15 1719 11/16/15 2154 11/17/15 0748  GLUCAP 192* 205* 190*   IMAGING x48h  - image(s) personally  visualized  -   highlighted in bold Dg Chest Port 1 View  Result Date: 11/16/2015 CLINICAL DATA:  Hypoxia EXAM: PORTABLE CHEST 1 VIEW COMPARISON:  11/15/2015 FINDINGS: Cardiomediastinal silhouette is stable. Right PICC line is unchanged in position. Persistent patchy airspace disease bilateral probable improving pneumonia. Mild improvement in aeration in left upper lobe. IMPRESSION: Persistent patchy airspace disease bilateral probable improving pneumonia. Mild improvement in aeration in left upper lobe. Electronically Signed   By: Lahoma Crocker M.D.   On: 11/16/2015 10:17     DISCUSSION: 55 yo with PMH of recent dx of Crohn's and reported chest pain, left leg pain and increasing SOB x 3 months. She was admitted 10/13 and treated for gastic infections associated with her Crohn's  Disease. Recent diagnosis of HIV, AIDS with B.L pulmonary infiltrates. Being empirically treated for PCP vs PNA. PCCM reconsulted for ongoing hypoxia.  She appears comfortable but anxious on Bipap. Will need to follow closely for need for intubation with increasing oxygen needs.We will continue to monitor closely and move to ICU with any deterioration. Dr. Vaughan Browner did  not recommend intubating just to get a bronchoscopy as he  Does not  think the yield would be good after many days of anti PCP and antibacterial therapy. However may need intubation for hypoxia and increasing oxygen needs.  CXR  10/30 looks slightly improved, but has increased oxygen demands/needs 10/31  as FiO2 has been increased to 100% with PO2 of 104 per ABG.Failed several attempts at high flow oxygen 10/30 and 10/31   ASSESSMENT / PLAN:  PULMONARY A: Acute hypoxic resp failure with infiltrates - due to PCP and pulmonary edema Increasing oxygen demands/ unable to transition from BiPap to [facility]. RO other causes of hypoxia  P:   BiPAP for respiratory failure (hypoxemic) from presumed PCP. Continue diuresis as tolerated by Cr.  Continue Abx per  ID Steroids for PCP CXR 10/31 to evaluate if any change since CXR 10/30. Lower extremity dopplers to evaluate for DVT/ PE High risk for intubation, will follow closely.  CARDIOVASCULAR A:  HTN CC 1/17 with no CAD Hx of Syncope P:  Lasix as tolerated Not ++ per I&O, but ? If accurate CXR intermittently  RENAL A:   CRI with possible AKI, -5L since admission but not sure if the ins part is documented officially. Hyperkalemia  P:   Follow urine output and Cr. 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 History, labs and imaging reviewed.  Recent diagnosis of HIV, AIDS Acute hypoxic resp failure with infiltrates - due to PCP and pulmonary edema. Other considerations include alternate oppurtunistic infections, ARDS, IRIS Not improving, desats again today off Bipap;  Will intubate, will need bronch with lavage.   The patient is critically ill with multiple organ systems failure and requires high complexity decision making for assessment and support, frequent evaluation and titration of therapies, application of advanced monitoring technologies and extensive interpretation of multiple databases. Critical Care Time devoted to patient care services described in this note  independent of APP time is 45 minutes.  Marshell Garfinkel MD [facility] Pager [phone] If no answer or after 3pm call: [phone] 11/17/2015, 12:53 PM

## 2015-11-17 NOTE — Progress Notes (Signed)
Late entry- pt transferred to 87M on BIPAP w/ no apparent complications on [id number] fio2.  Report given to 34m RT.

## 2015-11-18 ENCOUNTER — Inpatient Hospital Stay (HOSPITAL_COMMUNITY): Payer: BC Managed Care – PPO

## 2015-11-18 DIAGNOSIS — N182 Chronic kidney disease, stage 2 (mild): Secondary | ICD-10-CM

## 2015-11-18 DIAGNOSIS — Z515 Encounter for palliative care: Secondary | ICD-10-CM

## 2015-11-18 DIAGNOSIS — N179 Acute kidney failure, unspecified: Secondary | ICD-10-CM

## 2015-11-18 LAB — BLOOD GAS, ARTERIAL
ACID-BASE DEFICIT: 6.1 mmol/L — AB (ref 0.0–2.0)
Bicarbonate: 19.1 mmol/L — ABNORMAL LOW (ref 20.0–28.0)
Drawn by: 449561
FIO2: 80
O2 SAT: 95 %
PEEP/CPAP: 10 cmH2O
PH ART: 7.307 — AB (ref 7.350–7.450)
Patient temperature: 98.2
RATE: 26 resp/min
VT: 440 mL
pCO2 arterial: 39.4 mmHg (ref 32.0–48.0)
pO2, Arterial: 86.9 mmHg (ref 83.0–108.0)

## 2015-11-18 LAB — BASIC METABOLIC PANEL
ANION GAP: 10 (ref 5–15)
ANION GAP: 13 (ref 5–15)
BUN: 49 mg/dL — ABNORMAL HIGH (ref 6–20)
BUN: 52 mg/dL — ABNORMAL HIGH (ref 6–20)
CALCIUM: 8.2 mg/dL — AB (ref 8.9–10.3)
CO2: 20 mmol/L — ABNORMAL LOW (ref 22–32)
CO2: 18 mmol/L — AB (ref 22–32)
Calcium: 8.2 mg/dL — ABNORMAL LOW (ref 8.9–10.3)
Chloride: 98 mmol/L — ABNORMAL LOW (ref 101–111)
Chloride: 98 mmol/L — ABNORMAL LOW (ref 101–111)
Creatinine, Ser: 2.46 mg/dL — ABNORMAL HIGH (ref 0.44–1.00)
Creatinine, Ser: 2.64 mg/dL — ABNORMAL HIGH (ref 0.44–1.00)
GFR calc Af Amer: 24 mL/min — ABNORMAL LOW (ref >60)
GFR, EST AFRICAN AMERICAN: 22 mL/min — AB (ref >60)
GFR, EST NON AFRICAN AMERICAN: 21 mL/min — AB (ref >60)
GFR, EST NON AFRICAN AMERICAN: 19 mL/min — AB (ref >60)
GLUCOSE: 201 mg/dL — AB (ref 65–99)
GLUCOSE: 150 mg/dL — AB (ref 65–99)
POTASSIUM: 6 mmol/L — AB (ref 3.5–5.1)
POTASSIUM: 5.7 mmol/L — AB (ref 3.5–5.1)
SODIUM: 128 mmol/L — AB (ref 135–145)
Sodium: 129 mmol/L — ABNORMAL LOW (ref 135–145)

## 2015-11-18 LAB — GLUCOSE, CAPILLARY
GLUCOSE-CAPILLARY: 193 mg/dL — AB (ref 65–99)
GLUCOSE-CAPILLARY: 235 mg/dL — AB (ref 65–99)
GLUCOSE-CAPILLARY: 157 mg/dL — AB (ref 65–99)
Glucose-Capillary: 137 mg/dL — ABNORMAL HIGH (ref 65–99)
Glucose-Capillary: 177 mg/dL — ABNORMAL HIGH (ref 65–99)
Glucose-Capillary: 184 mg/dL — ABNORMAL HIGH (ref 65–99)

## 2015-11-18 LAB — CBC
HCT: 22.2 % — ABNORMAL LOW (ref 36.0–46.0)
Hemoglobin: 7.3 g/dL — ABNORMAL LOW (ref 12.0–15.0)
MCH: 28.4 pg (ref 26.0–34.0)
MCHC: 32.9 g/dL (ref 30.0–36.0)
MCV: 86.4 fL (ref 78.0–100.0)
PLATELETS: 93 10*3/uL — AB (ref 150–400)
RBC: 2.57 MIL/uL — ABNORMAL LOW (ref 3.87–5.11)
RDW: 17 % — AB (ref 11.5–15.5)
WBC: 7 10*3/uL (ref 4.0–10.5)

## 2015-11-18 LAB — PHOSPHORUS: Phosphorus: 5.6 mg/dL — ABNORMAL HIGH (ref 2.5–4.6)

## 2015-11-18 LAB — PNEUMOCYSTIS JIROVECI SMEAR BY DFA: PNEUMOCYSTIS JIROVECI AG: POSITIVE

## 2015-11-18 LAB — MAGNESIUM: Magnesium: 2.6 mg/dL — ABNORMAL HIGH (ref 1.7–2.4)

## 2015-11-18 MED ORDER — SODIUM CHLORIDE 0.9 % IV SOLN
INTRAVENOUS | Status: DC
  Administered 2015-11-18: 10 mL/h via INTRAVENOUS
  Administered 2015-11-23: 05:00:00 via INTRAVENOUS

## 2015-11-18 MED ORDER — SODIUM POLYSTYRENE SULFONATE 15 GM/60ML PO SUSP
30.0000 g | Freq: Once | ORAL | Status: AC
  Administered 2015-11-18: 30 g
  Filled 2015-11-18: qty 120

## 2015-11-18 NOTE — Progress Notes (Signed)
Initial Nutrition Assessment  DOCUMENTATION CODES:   Obesity unspecified  INTERVENTION:    If TF started, recommend Vital High Protein at goal rate of 10 ml/h (240 ml per day) and Prostat 60 ml TID  TF regimen + current Propofol infusion to provide 1157 kcals, 111 gm protein, 201 ml free water daily   NUTRITION DIAGNOSIS:   Inadequate oral intake related to inability to eat as evidenced by NPO status  GOAL:   Provide needs based on ASPEN/SCCM guidelines  MONITOR:   Vent status, Labs, Weight trends, I & O's  REASON FOR ASSESSMENT:   Ventilator  ASSESSMENT:   [name] with advanced HIV disease/AIDS initially admitted for colitis but then diagnosed with HIV and presumed PCP with +cxr, elevated LDH.  Patient is currently intubated on ventilator support Temp (24hrs), Avg:98.4 F (36.9 C), Min:98 F (36.7 C), Max:99 F (37.2 C)  Propofol: 12.0 ml/hr >> 317 fat kcals  Pt with increased oxygen demands on 100% BiPAP. Unable to transition to [facility] >> intubated and transferred to MICU. Family is upset/confused about treatment plan as they don't know her HIV diagnosis. Previoulsy on a Regular diet with poor PO intake >> 0-25% of meals. Labs and medications reviewed.  RD unable to complete Nutrition Focused Physical Exam at this time.  Diet Order:  Diet NPO time specified  Skin:  Reviewed, no issues  Last BM:  10/30  Height:   Ht Readings from Last 1 Encounters:  11/16/15 5\' 4"  (1.626 m)    Weight:   Wt Readings from Last 1 Encounters:  11/18/15 181 lb 10.5 oz (82.4 kg)    Ideal Body Weight:  54.5 kg  BMI:  Body mass index is 31.18 kg/m.  Estimated Nutritional Needs:   Kcal:  [id number]  Protein:  110-120 gm  Fluid:  per MD  EDUCATION NEEDS:   No education needs identified at this time  Arthur Holms, RD, LDN Pager #: [phone] After-Hours Pager #: [phone]

## 2015-11-18 NOTE — Progress Notes (Signed)
PT Cancellation Note  Patient Details Name: Pamila Urbain MRN: [id number] DOB: 1960-04-30   Cancelled Treatment:    Reason Eval/Treat Not Completed: Medical issues which prohibited therapy (pt intubated and not currently medically appropriate)   Lanetta Inch Beth 11/18/2015, 7:11 AM  Elwyn Reach, [place]

## 2015-11-18 NOTE — Care Management (Signed)
CM provided daughter school note as requested

## 2015-11-18 NOTE — Progress Notes (Signed)
PULMONARY / CRITICAL CARE MEDICINE   Name: Nancy Acevedo MRN: [id number] DOB: 12/31/1960    ADMISSION DATE:  11/02/2015 CONSULTATION DATE:  10/20  REFERRING MD:  Triad  CHIEF COMPLAINT: Can't breathe  Brief:  55 Y/O with hypertension, chronic kidney disease stage II, OSA, GERD, and recent diagnosis of Crohn's colitis managed with prednisone Admittedwith resp distress, fevers, B/L infitrates.  She was diagnosed with HIV, AIDS CD4 count <4. Presumed PCP vs bacterial PNA. Getting treatment with Abx, bactrim and steroids. Also on HAART therapy as per ID  Events 11/06/2015  -admit  10/14 - fever resolved 11/02/15 - fever back 10/17 -- MRSA Pcr negative 11/06/15- ICU tx 11/07/15 - HIV +ve from10/20/17, RVP  Negative, C Diff negative .STart bipap and lasix. ID consult -> Rx PCP (DO NOT DISCLOSE TO FAMILY). AUTOIMMUNE ORDERED 10/29- Re consult for ongoing hypoxia 10/31 Increasing oxygen demands on 100% BiPap, unable to transition to [facility]; Intubated & BAL perfomed in right middle lobe   SUBJECTIVE/OVERNIGHT/INTERVAL HX 10/29 called back by to evaluate for worsening hypoxemic respiratory failure. Pt has been alternating between Bipap and HFNC.  10/31, today,  she is on BIPAP 100% after being bathed as she feels short of breath. She has failed several attempts at [facility], yesterday and today  desaturating into the 39 's. She indicated to RT that she would like to be intubated.She is tolerating BiPap well, however  Per gas PO2 of 104 on 100%. 11/1: Family upset/confused about treatment plan as they do not know her diagnosis of HIV. Discussed with [place] (ID) who said typically do not disclose diagnosis until death or until a potential decision regarding withdrawing care is needed.    VITAL SIGNS: BP 116/66 (BP Location: Left Arm)   Pulse (!) 102   Temp 98.3 F (36.8 C) (Oral)   Resp (!) 26   Ht 5\' 4"  (1.626 m)   Wt 181 lb 10.5 oz (82.4 kg)   SpO2 97%   BMI 31.18 kg/m   HEMODYNAMICS:     VENTILATOR SETTINGS: Vent Mode: PRVC FiO2 (%):  [70 %-100 %] 70 % Set Rate:  [12 bmp-26 bmp] 26 bmp Vt Set:  [440 mL] 440 mL PEEP:  [6 cmH20-10 cmH20] 10 cmH20 Plateau Pressure:  [19 cmH20-31 cmH20] 26 cmH20  INTAKE / OUTPUT: I/O last 3 completed shifts: In: 2634.4 [P.O.:480; I.V.:464.4; IV Piggyback:1690] Out: 1985 [id number]  PHYSICAL EXAMINATION: General:  Sedated  Neuro: Not responsive to voice or sternal rub.  HEENT: ETT in place.  Cardiovascular:  RRR, Nl S1/S2, No MRG. Lungs:  Scattered crackles, rhonchi. Abdomen:  soft +bs, non-distended  Musculoskeletal:  Intact, edema and tenderness. Skin: warm and dry  LABS:  PULMONARY  Recent Labs Lab 11/15/15 0915 11/16/15 1140 11/17/15 0940 11/17/15 1740 11/18/15 0430  PHART 7.423 7.365 7.372 7.245* 7.307*  PCO2ART 27.5* 30.6* 31.8* 45.9 39.4  PO2ART 56.4* 65.8* 104 126.0* 86.9  HCO3 17.6* 17.1* 18.0* 19.9* 19.1*  TCO2  --   --   --  21  --   O2SAT 84.1 88.9 96.2 98.0 95.0   CBC  Recent Labs Lab 11/16/15 0419 11/17/15 0813 11/18/15 0500  HGB 7.7* 7.8* 7.3*  HCT 22.6* 23.0* 22.2*  WBC 7.6 8.6 7.0  PLT 133* 100* 93*   COAGULATION No results for input(s): INR in the last 168 hours.  CARDIAC  No results for input(s): TROPONINI in the last 168 hours. No results for input(s): PROBNP in the last 168 hours.  CHEMISTRY  Recent Labs  Lab 11/12/15 0408  11/14/15 0405 11/15/15 0800 11/16/15 0419 11/17/15 0813 11/18/15 0500  NA 122*  < > 126* 126* 129* 128* 128*  K 5.2*  < > 4.9 5.5* 5.6* 5.2* 6.0*  CL 90*  < > 96* 96* 99* 97* 98*  CO2 20*  < > 22 18* 19* 20* 20*  GLUCOSE 151*  < > 81 81 169* 179* 201*  BUN 27*  < > 27* 36* 44* 47* 49*  CREATININE 1.73*  < > 1.74* 2.08* 2.53* 2.36* 2.46*  CALCIUM 8.0*  < > 8.2* 8.3* 8.0* 8.2* 8.2*  MG  --   --   --   --   --   --  2.6*  PHOS 4.8*  --   --   --   --   --  5.6*  < > = values in this interval not displayed. Estimated Creatinine Clearance: 27.2 mL/min  (by C-G formula based on SCr of 2.46 mg/dL (H)).  LIVER  Recent Labs Lab 11/12/15 0408 11/16/15 0419 11/17/15 0813  AST  --  36 29  ALT  --  14 13*  ALKPHOS  --  154* 162*  BILITOT  --  0.4 0.4  PROT  --  5.3* 5.3*  ALBUMIN 2.3* 2.1* 2.2*   INFECTIOUS No results for input(s): LATICACIDVEN, PROCALCITON in the last 168 hours.  ENDOCRINE CBG (last 3)   Recent Labs  11/17/15 2331 11/18/15 0418 11/18/15 0807  GLUCAP 190* 193* 235*   IMAGING x48h  - image(s) personally visualized  -   highlighted in bold Ct Chest Wo Contrast  Result Date: 11/17/2015 CLINICAL DATA:  Acute respiratory failure with bilateral infiltrates on recent chest x-ray and history of HIV EXAM: CT CHEST WITHOUT CONTRAST TECHNIQUE: Multidetector CT imaging of the chest was performed following the standard protocol without IV contrast. COMPARISON:  Chest x-ray from 11/17/2015 and CT of the chest from 11/03/15 FINDINGS: Cardiovascular: No significant aneurysmal dilatation of the thoracic aorta is seen. Mild aortic calcifications are noted. Cardiac structures appear within normal limits. A right PICC line is noted in satisfactory position. Mediastinum/Nodes: No significant hilar or mediastinal adenopathy is seen. The previously noted right hilar adenopathy is less well appreciated due the lack of IV contrast. An endotracheal tube is noted in satisfactory position just above the carina. The esophagus as visualize is within normal limits. Lungs/Pleura: There has been significant progression in bilateral infiltrates particularly in the lower lobes with near complete consolidation identified. No sizable effusions are seen. Upper Abdomen: Within normal limits. Musculoskeletal: No acute bony abnormality is seen. IMPRESSION: Significant progression in bilateral infiltrates particularly in the lower lobes. This reflects the changes seen on recent chest x-ray. Electronically Signed   By: Inez Catalina M.D.   On: 11/17/2015 17:40    Dg Chest Port 1 View  Result Date: 11/18/2015 CLINICAL DATA:  Respiratory failure. EXAM: PORTABLE CHEST 1 VIEW COMPARISON:  Radiograph of November 17, 2015. FINDINGS: The heart size and mediastinal contours are within normal limits. Endotracheal tube is in grossly good position with distal tip 3.5 cm above the carina. Nasogastric tube is seen entering the stomach. No pneumothorax or significant pleural effusion is noted. Right-sided PICC line is noted with distal tip in expected position of cavoatrial junction. Bilateral perihilar and basilar airspace opacities are noted most consistent with inflammation or less likely edema. The visualized skeletal structures are unremarkable. IMPRESSION: Endotracheal and nasogastric tubes are in grossly good position. Stable bilateral airspace opacities as described above.  Electronically Signed   By: Marijo Conception, M.D.   On: 11/18/2015 07:20   Dg Chest Port 1 View  Result Date: 11/17/2015 CLINICAL DATA:  Respiratory failure EXAM: PORTABLE CHEST 1 VIEW COMPARISON:  11/16/2015 FINDINGS: Diffuse bilateral opacities are again noted, similar to prior study. Heart is normal size. No effusions. No acute bony abnormality. Right PICC line remains in place, unchanged. IMPRESSION: Persistent diffuse airspace opacities likely reflecting pneumonia. No real change. Electronically Signed   By: Rolm Baptise M.D.   On: 11/17/2015 12:53   Dg Chest Port 1 View  Result Date: 11/16/2015 CLINICAL DATA:  Hypoxia EXAM: PORTABLE CHEST 1 VIEW COMPARISON:  11/15/2015 FINDINGS: Cardiomediastinal silhouette is stable. Right PICC line is unchanged in position. Persistent patchy airspace disease bilateral probable improving pneumonia. Mild improvement in aeration in left upper lobe. IMPRESSION: Persistent patchy airspace disease bilateral probable improving pneumonia. Mild improvement in aeration in left upper lobe. Electronically Signed   By: Lahoma Crocker M.D.   On: 11/16/2015 10:17   Dg Abd  Portable 1v  Result Date: 11/17/2015 CLINICAL DATA:  OG tube advanced EXAM: PORTABLE ABDOMEN - 1 VIEW COMPARISON:  CT 10/18/2015 FINDINGS: Lung apices are not included. Tip of the endotracheal tube is 2.8 cm superior to carina. Patchy bibasilar pulmonary opacities. Right upper extremity catheter tip overlies the SVC. Esophageal tube tip is not included on the image but is below the diaphragm, likely within the distal stomach. IMPRESSION: 1. Esophageal tube tip not included but is below diaphragm, suspected to be within the distal stomach. 2. Mild ground-glass densities within the bilateral mid to lower lung zones which may represent infiltrate, edema or hemorrhage. Electronically Signed   By: Donavan Foil M.D.   On: 11/17/2015 21:28     DISCUSSION: 55 yo with PMH of recent dx of Crohn's and reported chest pain, left leg pain and increasing SOB x 3 months. She was admitted 10/13 and treated for gastic infections associated with her Crohn's  Disease. Recent diagnosis of HIV, AIDS with B.L pulmonary infiltrates. Being empirically treated for PCP vs PNA. PCCM reconsulted for ongoing hypoxia.  CXR  10/30 looks slightly improved, but has increased oxygen demands/needs 10/31  as FiO2 has been increased to 100% with PO2 of 104 per ABG.Failed several attempts at high flow oxygen 10/30 and 10/31. Patient subsequently intubated on 10/31 and bronchoscopy was performed.    ASSESSMENT / PLAN:  PULMONARY A: Admit:  Acute hypoxic resp failure with infiltrates - thought due to PCP and pulmonary edema. Other considerations are other opportunistic infections, ARDS, and IRIS.   Current:  Unable to wean from BiPAP. Intubated 10/31.  S/p bronchoscopy with BAL with diffuse alveolar hemorrhage.  Lower extremity dopplers negative for DVT bilaterally.  On 70% FiO2 with PEEP of 10.   P:   Continue diuresis as tolerated by Cr.  Continue Abx per ID Steroids for PCP, may need to increase if concern for ARDS     CARDIOVASCULAR A:  HTN CC 1/17 with no CAD Hx of Syncope P:  Lasix as tolerated CXR intermittently  RENAL A:   CRI with possible AKI, -6L since admission but not sure if the ins part is documented officially. Hyperkalemia, K 6.0 (11/18/15)   P:   Follow urine output and Cr. 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   P:   Per ID consult Bactrim and steroids Unasyn/ Azithro   ENDOCRINE A:   DM exacerbated by steroids for Crohn's  P:   SSI  FAMILY  - Updates: Family updated bedside 11/1  Phill Myron, D.O. 11/18/2015, 9:00 AM PGY-2, [place]

## 2015-11-18 NOTE — Progress Notes (Signed)
[facility] for Infectious Disease   Reason for visit: Follow up on PCP pneumonia  Interval History: intubated yesterday, PCP confirmed positive; family at bedside including daughter, mother and sister; BAL noted diffuse alveolar hemorrhage; creat a bit up. PEEP 10.    CXR independently reviewed and diffuse patchy infiltrates  Physical Exam: Constitutional:  Vitals:   11/18/15 1045 11/18/15 1100  BP: 116/66 107/62  Pulse: (!) 108 (!) 107  Resp: (!) 25 15  Temp:    intubated, sedated Eyes: anicteric HENT: + ET Respiratory: diffuse rhonchi Cardiovascular: tachy RR GI: soft, nt, nd  Review of Systems: Unable to be assessed due to mental status  Lab Results  Component Value Date   WBC 7.0 11/18/2015   HGB 7.3 (L) 11/18/2015   HCT 22.2 (L) 11/18/2015   MCV 86.4 11/18/2015   PLT 93 (L) 11/18/2015    Lab Results  Component Value Date   CREATININE 2.46 (H) 11/18/2015   BUN 49 (H) 11/18/2015   NA 128 (L) 11/18/2015   K 6.0 (H) 11/18/2015   CL 98 (L) 11/18/2015   CO2 20 (L) 11/18/2015    Lab Results  Component Value Date   ALT 13 (L) 11/17/2015   AST 29 11/17/2015   ALKPHOS 162 (H) 11/17/2015     Microbiology: Recent Results (from the past 240 hour(s))  Culture, blood (Routine X 2) w Reflex to ID Panel     Status: None (Preliminary result)   Collection Time: 11/14/15 10:15 AM  Result Value Ref Range Status   Specimen Description BLOOD LEFT ASSIST CONTROL  Final   Special Requests BOTTLES DRAWN AEROBIC AND ANAEROBIC 5CC  Final   Culture NO GROWTH 3 DAYS  Final   Report Status PENDING  Incomplete  Culture, blood (Routine X 2) w Reflex to ID Panel     Status: None (Preliminary result)   Collection Time: 11/14/15 10:15 AM  Result Value Ref Range Status   Specimen Description BLOOD LEFT HAND  Final   Special Requests IN PEDIATRIC BOTTLE 3CC  Final   Culture NO GROWTH 3 DAYS  Final   Report Status PENDING  Incomplete  Culture, bal-quantitative     Status:  None (Preliminary result)   Collection Time: 11/17/15  4:28 PM  Result Value Ref Range Status   Specimen Description BRONCHIAL ALVEOLAR LAVAGE  Final   Special Requests NONE  Final   Gram Stain   Final    MODERATE WBC PRESENT, PREDOMINANTLY PMN NO ORGANISMS SEEN    Culture PENDING  Incomplete   Report Status PENDING  Incomplete  Pneumocystis smear by DFA     Status: None   Collection Time: 11/17/15  4:28 PM  Result Value Ref Range Status   Specimen Source-[facility] BRONCHIAL ALVEOLAR LAVAGE  Final   Pneumocystis jiroveci Ag POSITIVE  Final    Comment: Performed at [place], READ BACK BY AND VERIFIED WITH: JENNIFER CLIFTON RN BY LM AT [id number] ON 11/18/15     Impression/Plan:  1. PCP pneumonia - on bactrim and steroids and will continue.  Creat has increased some but bactrim will continue bactrim for now, consider clinda/primaquin if worsens.   2. HIVAIDS - viral load down but with worsening respiratory status, ARVs being held with concern for IRIS.   3. Respiratory failure - high peep in the setting of a prolonged illness with poor immune system/AIDS.  At this point, the prognosis is particularly grim.   I  discussed prognosis with the family and that despite appropriate treatments, she has continued to worsen and now that she is intubated, the next 48-72 hours will be critical to start to wean off of the vent if there is a chance she will improve.  The family remains hopeful, though partially since they are unaware of the entire picture.  If no improvement soon or worsening, consider curbside of palliative care on disclosure in this case if they have any insight.

## 2015-11-18 NOTE — Patient Care Conference (Signed)
  Interdisciplinary Goals of Care Family Meeting   Date carried out:: 11/18/2015  Location of the meeting: Bedside  Member's involved: Physician, Bedside Registered Nurse, Family Member or next of kin and Other: daugher sherese and mom  Durable Power of Attorney or acting medical decision maker: Mom Nancy Acevedo and Daughter Nancy Acevedo    Discussion: We discussed goals of care for Nancy Acevedo .  - gave news of ARDS, renal failure, alveolar hemorrage and HIV/AIDS. Daughther and mom broke into tears. Shock at dx. They have asked that only these 2 people be spoken to (daughter Nancy Acevedo does not know). Explained ARDS and poor prognosis  Code status: Full Code  Disposition: Continue current acute care  Time spent for the meeting: 30 min. Chaplain called for support  Julann Mcgilvray 11/18/2015, 2:49 PM

## 2015-11-18 NOTE — Progress Notes (Signed)
K+ 5.7 with order to give Kayexalate again and crea 2.6 and decreased urine output called to Elink

## 2015-11-18 NOTE — Progress Notes (Signed)
Paged to patient's family in conference rm. Mother and daughter of pt were receiving explanations of pt's med condition from doctor, nurse. Former was calm now and leaving to pick up another family member. Daughter was still crying, yet now accepting of med provider's explanations as to pt's condition and why family's prior knowledge of that was confusing. Pt has ADS. Daughter said she now understands that means pt may not be able to keep breathing and live, and she can deal with that. Pt's mother is receptive to someone's coming back tomorrow, though would also call family's minister. Advised daughter to ask nurse to page chaplain as needed.    11/18/15 1500  Clinical Encounter Type  Visited With Family;Health care provider  Visit Type Follow-up  Referral From Nurse  Spiritual Encounters  Spiritual Needs Emotional;Grief support;Other (Comment)  Stress Factors  Patient Stress Factors Health changes (spiritual support)  Family Stress Factors Family relationships;Health changes;Loss of control

## 2015-11-18 NOTE — Progress Notes (Signed)
K+ 6.0 and crea 2.4 no results from lasix dose this am reported to Dr Chase Caller

## 2015-11-18 NOTE — Consult Note (Signed)
Palliative Care Consultation Reason: Ethics, Complex Decision Making Requested by: Dr. Chase Caller  Issue: Disclosure of HIV Status  Chart reviewed in detail and case discussed with Dr. Chase Caller CCM. [name] has newly diagnosed HIV in the setting of life threatening illness. 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 She subsequently developed PCP PNA and now ARDS which has a very high mortality rate. Infectious disease consultation obtained after positive result on 10/21. At the time she was made aware of her diagnosis she requested her status not be disclosed to anyone including her relatives. Her female partner is the only person who is aware of her diagnosis.   Unfortunately due to her rapidly progressive illness and hospital course her wishes for non-disclosure were not out into writing which would also require a healthcare POA which she does not have.   [facility] governing disclosure of HIV status and confidentiality along with standard HIPAA has several exclusions - one of which applies in this situation. HIV status may be disclosed if necessary to provide urgent or necessary medical care to next of kin relatives who will be responsible for making medically informed decisions regarding the patient's care. In this situation, the patient's daughter and mother are at bedside asking for information. The boyfriend has no legal rights to make decisions for her under current law.  This patient is most likely not going to survive this hospitalization. It is also recognized as an individuals responsibility to protect their own privacy in these situations by completing HCPOA paperwork and making sure providers are aware of this information. Unfortunately due to the urgency and severity of her condition there is not an opportunity for this level of assistance.  A discussion about mortality  related to ARDS/PCP and her serious illness at such a young age may require disclosure of HIV as providing a complete and honest picture of the severity of illness to the surrogate decisions makers. If HIV status is disclosed I recommend including only NOK surrogates by decision making hierarchy included in [place] law in the conversation.  For any additional recommendations or assistance in ethics recs please place an ethics committee consult or contact risk management for specific guidance on how to proceed.  Lane Hacker, DO Palliative Medicine [phone]  Time: 12:30-1:35PM Total Time: 35 minutes Total time spent reviewing chart, [place] healthcare law and discussing case with providers.

## 2015-11-18 DEATH — deceased

## 2015-11-19 ENCOUNTER — Inpatient Hospital Stay (HOSPITAL_COMMUNITY): Payer: BC Managed Care – PPO

## 2015-11-19 LAB — BASIC METABOLIC PANEL
ANION GAP: 13 (ref 5–15)
BUN: 64 mg/dL — AB (ref 6–20)
CALCIUM: 7.9 mg/dL — AB (ref 8.9–10.3)
CO2: 19 mmol/L — AB (ref 22–32)
Chloride: 100 mmol/L — ABNORMAL LOW (ref 101–111)
Creatinine, Ser: 3.19 mg/dL — ABNORMAL HIGH (ref 0.44–1.00)
GFR calc Af Amer: 18 mL/min — ABNORMAL LOW (ref >60)
GFR calc non Af Amer: 15 mL/min — ABNORMAL LOW (ref >60)
GLUCOSE: 179 mg/dL — AB (ref 65–99)
Potassium: 4.6 mmol/L (ref 3.5–5.1)
Sodium: 132 mmol/L — ABNORMAL LOW (ref 135–145)

## 2015-11-19 LAB — GLUCOSE, CAPILLARY
GLUCOSE-CAPILLARY: 195 mg/dL — AB (ref 65–99)
Glucose-Capillary: 165 mg/dL — ABNORMAL HIGH (ref 65–99)
Glucose-Capillary: 169 mg/dL — ABNORMAL HIGH (ref 65–99)
Glucose-Capillary: 172 mg/dL — ABNORMAL HIGH (ref 65–99)
Glucose-Capillary: 238 mg/dL — ABNORMAL HIGH (ref 65–99)
Glucose-Capillary: 274 mg/dL — ABNORMAL HIGH (ref 65–99)

## 2015-11-19 LAB — CBC
HCT: 19.1 % — ABNORMAL LOW (ref 36.0–46.0)
HEMOGLOBIN: 6.5 g/dL — AB (ref 12.0–15.0)
MCH: 29.7 pg (ref 26.0–34.0)
MCHC: 34 g/dL (ref 30.0–36.0)
MCV: 87.2 fL (ref 78.0–100.0)
Platelets: 76 10*3/uL — ABNORMAL LOW (ref 150–400)
RBC: 2.19 MIL/uL — ABNORMAL LOW (ref 3.87–5.11)
RDW: 17.4 % — AB (ref 11.5–15.5)
WBC: 4.5 10*3/uL (ref 4.0–10.5)

## 2015-11-19 LAB — HIV-1 RNA ULTRAQUANT REFLEX TO GENTYP+
HIV-1 RNA BY PCR: 607000 {copies}/mL
HIV-1 RNA QUANT, LOG: 5.783 {Log_copies}/mL

## 2015-11-19 LAB — CULTURE, BLOOD (ROUTINE X 2)
CULTURE: NO GROWTH
Culture: NO GROWTH

## 2015-11-19 LAB — REFLEX TO GENOSURE(R) MG: HIV GenoSure(R) MG PDF: 0

## 2015-11-19 LAB — PREPARE RBC (CROSSMATCH)

## 2015-11-19 LAB — HEMOGLOBIN AND HEMATOCRIT, BLOOD
HCT: 23.9 % — ABNORMAL LOW (ref 36.0–46.0)
HEMOGLOBIN: 7.9 g/dL — AB (ref 12.0–15.0)

## 2015-11-19 MED ORDER — INSULIN ASPART 100 UNIT/ML ~~LOC~~ SOLN
0.0000 [IU] | SUBCUTANEOUS | Status: DC
  Administered 2015-11-19: 8 [IU] via SUBCUTANEOUS
  Administered 2015-11-19: 5 [IU] via SUBCUTANEOUS

## 2015-11-19 MED ORDER — DEXTROSE 5 % IV SOLN
400.0000 mg | Freq: Two times a day (BID) | INTRAVENOUS | Status: DC
  Administered 2015-11-19 – 2015-11-23 (×8): 400 mg via INTRAVENOUS
  Filled 2015-11-19 (×12): qty 25

## 2015-11-19 MED ORDER — DOCUSATE SODIUM 50 MG/5ML PO LIQD
200.0000 mg | Freq: Two times a day (BID) | ORAL | Status: DC
  Administered 2015-11-19 – 2015-11-25 (×8): 200 mg via ORAL
  Filled 2015-11-19 (×17): qty 20

## 2015-11-19 MED ORDER — VITAL HIGH PROTEIN PO LIQD
1000.0000 mL | ORAL | Status: DC
  Administered 2015-11-19 – 2015-11-23 (×3): 1000 mL
  Filled 2015-11-19 (×2): qty 1000

## 2015-11-19 MED ORDER — PANTOPRAZOLE SODIUM 40 MG PO PACK
40.0000 mg | PACK | Freq: Every day | ORAL | Status: DC
  Administered 2015-11-19 – 2015-11-26 (×8): 40 mg
  Filled 2015-11-19 (×9): qty 20

## 2015-11-19 MED ORDER — FLUCONAZOLE IN SODIUM CHLORIDE 100-0.9 MG/50ML-% IV SOLN
50.0000 mg | INTRAVENOUS | Status: DC
  Administered 2015-11-20 – 2015-11-24 (×5): 50 mg via INTRAVENOUS
  Filled 2015-11-19 (×6): qty 25

## 2015-11-19 MED ORDER — SODIUM CHLORIDE 0.9 % IV SOLN
Freq: Once | INTRAVENOUS | Status: AC
  Administered 2015-11-19: 11:00:00 via INTRAVENOUS

## 2015-11-19 MED ORDER — PRO-STAT SUGAR FREE PO LIQD
60.0000 mL | Freq: Three times a day (TID) | ORAL | Status: DC
  Administered 2015-11-19 – 2015-11-23 (×13): 60 mL via ORAL
  Filled 2015-11-19 (×14): qty 60

## 2015-11-19 MED ORDER — ALBUTEROL SULFATE (2.5 MG/3ML) 0.083% IN NEBU: 2.5000 mg | INHALATION_SOLUTION | RESPIRATORY_TRACT | Status: DC | PRN

## 2015-11-19 MED ORDER — FLUCONAZOLE IN SODIUM CHLORIDE 100-0.9 MG/50ML-% IV SOLN
100.0000 mg | Freq: Once | INTRAVENOUS | Status: AC
  Administered 2015-11-19: 100 mg via INTRAVENOUS
  Filled 2015-11-19 (×2): qty 50

## 2015-11-19 NOTE — Progress Notes (Signed)
Pharmacy Antibiotic Note  Nancy Acevedo is a 55 y.o. female admitted on 10/29/2015 with PCP pneumonia.  Pharmacy has been consulted for Bactrim and Unasyn dosing.  Ms. Shorty is on day 14 of bactrim tx for suspected PCP pneumonia. Unasyn was added on 10/29 for aspiration pneumonia. ART held at this time due to concerns with IRIS.   Renal function has been worsening over last few days SCr 1.74>2.46>2.64>3.19. Due to worsening condition will adjust doses of her antibiotics according to renal function.    Plan: Change Bactrim to 400 mg TMP IV q12h.  Continue Unasyn to 3g IV q12h Hold Tivicay/Descovy Monitor renal function, oxygen requirements, and ability to resume ART.   Height: '5\' 4"'$  (162.6 cm) Weight: 182 lb 1.6 oz (82.6 kg) IBW/kg (Calculated) : 54.7  Temp (24hrs), Avg:98.9 F (37.2 C), Min:98.3 F (36.8 C), Max:99.5 F (37.5 C)   Recent Labs Lab 11/14/15 1000  11/16/15 0419 11/17/15 0813 11/18/15 0500 11/18/15 1520 11/19/15 0659  WBC 10.5  --  7.6 8.6 7.0  --  4.5  CREATININE  --   < > 2.53* 2.36* 2.46* 2.64* 3.19*  < > = values in this interval not displayed.  Estimated Creatinine Clearance: 21 mL/min (by C-G formula based on SCr of 3.19 mg/dL (H)).    Allergies  Allergen Reactions  . Lexapro [Escitalopram] Itching  . Losartan   . Spironolactone     rash  . Clonidine Derivatives Palpitations    Antimicrobials this admission: Vanc 10/17 x1 ('750mg'$ ); 10/19 x1 (g) Zosyn 10/17 >> 10/22 Cipro 10/13>> 10/16 Flagyl 10/13>> 10/16 Bactrim 10/20 >> Unasyn 10/30 >> Azithromycin weekly 10/26 >> Fluconazole 11/2 >>  Dose adjustments this admission: Bactrim switched to po on 10/26; changed back to IV 10/30; increase dose to 400 mg q12h  Microbiology results: 10/14 Cdiff >> negative 10/16 Quantiferon Gold >> indeterminate so PPD placed 10/17 - read as negative 10/17 BCx: neg 10/17 MRSA PCR: neg 10/19 BCx2: neg 10/19 Cdif: neg x2 10/20 HIV: pos 10/21 HIV RNA:  ip 10/21 CD4: <10 10/21 HLA: neg 10/21 Resp PCR: neg 10/28 BCx: ngtd    Apolonio Schneiders D Jerrian Mells 11/19/2015 10:40 AM

## 2015-11-19 NOTE — Progress Notes (Signed)
Physical Therapy Discharge Patient Details Name: Nancy Acevedo MRN: [id number] DOB: May 02, 1960 Today's Date: 11/19/2015 Time:  -     Patient discharged from PT services secondary to medical decline - will need to re-order PT to resume therapy services.  Please see latest therapy progress note for current level of functioning and progress toward goals.    Progress and discharge plan discussed with patient and/or caregiver: Patient unable to participate in discharge planning and no caregivers available  GP     Othelia Pulling [facility] [phone] (pager)  11/19/2015, 10:44 AM

## 2015-11-19 NOTE — Progress Notes (Signed)
PT Cancellation Note  Patient Details Name: Nancy Acevedo MRN: [id number] DOB: [birth year]   Cancelled Treatment:    Reason Eval/Treat Not Completed: Medical issues which prohibited therapy (Pt with medical decline. Nurse asked PT to sign off.  )   Denice Paradise 11/19/2015, 10:43 AM  [facility] [phone] (pager)

## 2015-11-19 NOTE — Progress Notes (Signed)
[facility] Progress Note Patient Name: Ajuni Pagani DOB: [birth year] MRN: [id number]   Date of Service  11/19/2015  HPI/Events of Note  Blood glucose = 274. Patient is on standard Novolog SSI.  eICU Interventions  Will order: 1. Increase dose to Q 4 hour moderate Novolog SSI.     Intervention Category Intermediate Interventions: Hyperglycemia - evaluation and treatment  Denaisha Swango [place] 11/19/2015, 8:14 PM

## 2015-11-19 NOTE — Progress Notes (Signed)
[facility] for Infectious Disease   Reason for visit: Follow up on PCP pneumonia  Interval History: intubated yesterday, PCP confirmed positive; mother and one daughter now aware of HIV status. PEEP 8.    CXR independently reviewed and diffuse patchy infiltrates, worsening  Physical Exam: Constitutional:  Vitals:   11/19/15 1400 11/19/15 1500  BP: 120/67 124/71  Pulse: (!) 115 (!) 110  Resp: (!) 28 (!) 23  Temp:    intubated, sedated; opens eyes Eyes: anicteric HENT: + ET Respiratory: diffuse rhonchi Cardiovascular: tachy RR GI: soft, nt, nd  Review of Systems: Unable to be assessed due to mental status  Lab Results  Component Value Date   WBC 4.5 11/19/2015   HGB 6.5 (LL) 11/19/2015   HCT 19.1 (L) 11/19/2015   MCV 87.2 11/19/2015   PLT 76 (L) 11/19/2015    Lab Results  Component Value Date   CREATININE 3.19 (H) 11/19/2015   BUN 64 (H) 11/19/2015   NA 132 (L) 11/19/2015   K 4.6 11/19/2015   CL 100 (L) 11/19/2015   CO2 19 (L) 11/19/2015    Lab Results  Component Value Date   ALT 13 (L) 11/17/2015   AST 29 11/17/2015   ALKPHOS 162 (H) 11/17/2015     Microbiology: Recent Results (from the past 240 hour(s))  Culture, blood (Routine X 2) w Reflex to ID Panel     Status: None   Collection Time: 11/14/15 10:15 AM  Result Value Ref Range Status   Specimen Description BLOOD LEFT ASSIST CONTROL  Final   Special Requests BOTTLES DRAWN AEROBIC AND ANAEROBIC 5CC  Final   Culture NO GROWTH 5 DAYS  Final   Report Status 11/19/2015 FINAL  Final  Culture, blood (Routine X 2) w Reflex to ID Panel     Status: None   Collection Time: 11/14/15 10:15 AM  Result Value Ref Range Status   Specimen Description BLOOD LEFT HAND  Final   Special Requests IN PEDIATRIC BOTTLE 3CC  Final   Culture NO GROWTH 5 DAYS  Final   Report Status 11/19/2015 FINAL  Final  Culture, bal-quantitative     Status: None (Preliminary result)   Collection Time: 11/17/15  4:28 PM  Result  Value Ref Range Status   Specimen Description BRONCHIAL ALVEOLAR LAVAGE  Final   Special Requests NONE  Final   Gram Stain   Final    MODERATE WBC PRESENT, PREDOMINANTLY PMN NO ORGANISMS SEEN    Culture NO GROWTH 2 DAYS  Final   Report Status PENDING  Incomplete  Pneumocystis smear by DFA     Status: None   Collection Time: 11/17/15  4:28 PM  Result Value Ref Range Status   Specimen Source-[facility] BRONCHIAL ALVEOLAR LAVAGE  Final   Pneumocystis jiroveci Ag POSITIVE  Final    Comment: Performed at [place], READ BACK BY AND VERIFIED WITH: JENNIFER CLIFTON RN BY LM AT [id number] ON 11/18/15     Impression/Plan:  1. PCP pneumonia - on bactrim and steroids and will continue.  Creat has increased some but bactrim will continue bactrim for now.  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  Mother and one daughter aware of diagnosis. 3. Respiratory failure - high peep in the setting of a prolonged illness with poor immune system/AIDS.

## 2015-11-19 NOTE — Progress Notes (Signed)
Patient tachypnic in the 30s, double stacking, wide awake despite being on 249mcg of Fentanyl. Propofol gtt restarted per orders.

## 2015-11-19 NOTE — Progress Notes (Signed)
PULMONARY / CRITICAL CARE MEDICINE   Name: Nancy Acevedo MRN: [id number] DOB: [birth year]    ADMISSION DATE:  11/02/2015 CONSULTATION DATE:  10/20  REFERRING MD:  [facility]  CHIEF COMPLAINT: Can't breathe  Brief:  55 Y/O with hypertension, chronic kidney disease stage II, OSA, GERD, and recent diagnosis of Crohn's colitis managed with prednisone Admittedwith resp distress, fevers, B/L infitrates.  She was diagnosed with HIV, AIDS CD4 count <4. Presumed PCP vs bacterial PNA. Getting treatment with Abx, bactrim and steroids. Also on HAART therapy as per ID  LINES/TUBES: PICC 10/15 >>  ETT 10/31 >>  Foley 11/1 >>  NG/OT 11/1 >>   Events 11/17/2015  -admit  10/14 - fever resolved 11/02/15 - fever back 10/17 -- MRSA Pcr negative 11/06/15- ICU tx 11/07/15 - HIV +ve from10/20/17, RVP  Negative, C Diff negative .STart bipap and lasix. ID consult -> Rx PCP (DO NOT DISCLOSE TO FAMILY). AUTOIMMUNE ORDERED 10/29- Re consult for ongoing hypoxia 10/31 Increasing oxygen demands on 100% BiPap, unable to transition to [facility]; Intubated & BAL perfomed in right middle lobe  11/1: Family meeting where diagnosis disclosed to [name] and mother   SUBJECTIVE/OVERNIGHT/INTERVAL HX Family updated about patient's medical history at family meeting yesterday. Has been responsive to vocal commands. Have been able to wean vent settings and decrease sedation requirement.    VITAL SIGNS: BP 101/66 (BP Location: Left Arm)   Pulse (!) 102   Temp 98.3 F (36.8 C) (Oral)   Resp (!) 26   Ht 5\' 4"  (1.626 m)   Wt 182 lb 1.6 oz (82.6 kg)   SpO2 93%   BMI 31.26 kg/m   HEMODYNAMICS:    VENTILATOR SETTINGS: Vent Mode: PRVC FiO2 (%):  [40 %-70 %] 50 % Set Rate:  [26 bmp] 26 bmp Vt Set:  [440 mL] 440 mL PEEP:  [8 cmH20-10 cmH20] 8 cmH20 Plateau Pressure:  [18 cmH20-26 cmH20] 21 cmH20  INTAKE / OUTPUT: I/O last 3 completed shifts: In: 2324.2 [I.V.:804.2; NG/GT:200; IV Piggyback:1320] Out: 2100  [Urine:2100]  PHYSICAL EXAMINATION: General:  Sedated  Neuro: Responsive to painful stimuli. Will wiggle toes on command.  HEENT: ETT in place.  Cardiovascular:  RRR, Nl S1/S2, No MRG. Lungs:  Scattered crackles, rhonchi. Abdomen:  soft +bs, non-distended  Musculoskeletal:  Intact, edema and tenderness. Skin: warm and dry  LABS:  PULMONARY  Recent Labs Lab 11/15/15 0915 11/16/15 1140 11/17/15 0940 11/17/15 1740 11/18/15 0430  PHART 7.423 7.365 7.372 7.245* 7.307*  PCO2ART 27.5* 30.6* 31.8* 45.9 39.4  PO2ART 56.4* 65.8* 104 126.0* 86.9  HCO3 17.6* 17.1* 18.0* 19.9* 19.1*  TCO2  --   --   --  21  --   O2SAT 84.1 88.9 96.2 98.0 95.0   CBC  Recent Labs Lab 11/16/15 0419 11/17/15 0813 11/18/15 0500  HGB 7.7* 7.8* 7.3*  HCT 22.6* 23.0* 22.2*  WBC 7.6 8.6 7.0  PLT 133* 100* 93*   COAGULATION No results for input(s): INR in the last 168 hours.  CARDIAC  No results for input(s): TROPONINI in the last 168 hours. No results for input(s): PROBNP in the last 168 hours.  CHEMISTRY  Recent Labs Lab 11/15/15 0800 11/16/15 0419 11/17/15 0813 11/18/15 0500 11/18/15 1520  NA 126* 129* 128* 128* 129*  K 5.5* 5.6* 5.2* 6.0* 5.7*  CL 96* 99* 97* 98* 98*  CO2 18* 19* 20* 20* 18*  GLUCOSE 81 169* 179* 201* 150*  BUN 36* 44* 47* 49* 52*  CREATININE 2.08* 2.53*  2.36* 2.46* 2.64*  CALCIUM 8.3* 8.0* 8.2* 8.2* 8.2*  MG  --   --   --  2.6*  --   PHOS  --   --   --  5.6*  --    Estimated Creatinine Clearance: 25.3 mL/min (by C-G formula based on SCr of 2.64 mg/dL (H)).  LIVER  Recent Labs Lab 11/16/15 0419 11/17/15 0813  AST 36 29  ALT 14 13*  ALKPHOS 154* 162*  BILITOT 0.4 0.4  PROT 5.3* 5.3*  ALBUMIN 2.1* 2.2*   INFECTIOUS No results for input(s): LATICACIDVEN, PROCALCITON in the last 168 hours.  ENDOCRINE CBG (last 3)   Recent Labs  11/18/15 1959 11/19/15 0001 11/19/15 0328  GLUCAP 157* 184* 165*   IMAGING x48h  - image(s) personally visualized  -    highlighted in bold Ct Chest Wo Contrast  Result Date: 11/17/2015 CLINICAL DATA:  Acute respiratory failure with bilateral infiltrates on recent chest x-ray and history of HIV EXAM: CT CHEST WITHOUT CONTRAST TECHNIQUE: Multidetector CT imaging of the chest was performed following the standard protocol without IV contrast. COMPARISON:  Chest x-ray from 11/17/2015 and CT of the chest from 11/03/15 FINDINGS: Cardiovascular: No significant aneurysmal dilatation of the thoracic aorta is seen. Mild aortic calcifications are noted. Cardiac structures appear within normal limits. A right PICC line is noted in satisfactory position. Mediastinum/Nodes: No significant hilar or mediastinal adenopathy is seen. The previously noted right hilar adenopathy is less well appreciated due the lack of IV contrast. An endotracheal tube is noted in satisfactory position just above the carina. The esophagus as visualize is within normal limits. Lungs/Pleura: There has been significant progression in bilateral infiltrates particularly in the lower lobes with near complete consolidation identified. No sizable effusions are seen. Upper Abdomen: Within normal limits. Musculoskeletal: No acute bony abnormality is seen. IMPRESSION: Significant progression in bilateral infiltrates particularly in the lower lobes. This reflects the changes seen on recent chest x-ray. Electronically Signed   By: Inez Catalina M.D.   On: 11/17/2015 17:40   Dg Chest Port 1 View  Result Date: 11/18/2015 CLINICAL DATA:  Respiratory failure. EXAM: PORTABLE CHEST 1 VIEW COMPARISON:  Radiograph of November 17, 2015. FINDINGS: The heart size and mediastinal contours are within normal limits. Endotracheal tube is in grossly good position with distal tip 3.5 cm above the carina. Nasogastric tube is seen entering the stomach. No pneumothorax or significant pleural effusion is noted. Right-sided PICC line is noted with distal tip in expected position of cavoatrial  junction. Bilateral perihilar and basilar airspace opacities are noted most consistent with inflammation or less likely edema. The visualized skeletal structures are unremarkable. IMPRESSION: Endotracheal and nasogastric tubes are in grossly good position. Stable bilateral airspace opacities as described above. Electronically Signed   By: Marijo Conception, M.D.   On: 11/18/2015 07:20   Dg Chest Port 1 View  Result Date: 11/17/2015 CLINICAL DATA:  Respiratory failure EXAM: PORTABLE CHEST 1 VIEW COMPARISON:  11/16/2015 FINDINGS: Diffuse bilateral opacities are again noted, similar to prior study. Heart is normal size. No effusions. No acute bony abnormality. Right PICC line remains in place, unchanged. IMPRESSION: Persistent diffuse airspace opacities likely reflecting pneumonia. No real change. Electronically Signed   By: Rolm Baptise M.D.   On: 11/17/2015 12:53   Dg Abd Portable 1v  Result Date: 11/17/2015 CLINICAL DATA:  OG tube advanced EXAM: PORTABLE ABDOMEN - 1 VIEW COMPARISON:  CT 10/24/2015 FINDINGS: Lung apices are not included. Tip of the endotracheal  tube is 2.8 cm superior to carina. Patchy bibasilar pulmonary opacities. Right upper extremity catheter tip overlies the SVC. Esophageal tube tip is not included on the image but is below the diaphragm, likely within the distal stomach. IMPRESSION: 1. Esophageal tube tip not included but is below diaphragm, suspected to be within the distal stomach. 2. Mild ground-glass densities within the bilateral mid to lower lung zones which may represent infiltrate, edema or hemorrhage. Electronically Signed   By: Donavan Foil M.D.   On: 11/17/2015 21:28     DISCUSSION: 55 yo with PMH of recent dx of Crohn's and reported chest pain, left leg pain and increasing SOB x 3 months. She was admitted 10/13 and treated for gastic infections associated with her Crohn's  Disease. Recent diagnosis of HIV, AIDS with B.L pulmonary infiltrates. Being empirically treated  for PCP vs PNA. PCCM reconsulted for ongoing hypoxia.  CXR  10/30 looks slightly improved, but has increased oxygen demands/needs 10/31  as FiO2 has been increased to 100% with PO2 of 104 per ABG.Failed several attempts at high flow oxygen 10/30 and 10/31. Patient subsequently intubated on 10/31 and bronchoscopy was performed.    ASSESSMENT / PLAN:  PULMONARY A: Admit:  Acute hypoxic resp failure with infiltrates  Current:  PCP Pneumonia  ARDS due to Alveolar Hemorrhage  Unable to wean from BiPAP. Intubated 10/31.  S/p bronchoscopy with BAL with diffuse alveolar hemorrhage.  Lower extremity dopplers negative for DVT bilaterally.   On 50% FiO2 with PEEP of 8.   P:   Continue diuresis as tolerated by Cr.  Continue Abx per ID Steroids for PCP   CARDIOVASCULAR A:  Hx of HTN CC 1/17 with no CAD Hx of Syncope Currently Hypotensive  P:  Lasix as tolerated CXR intermittently  RENAL A:   CRI with AKI, -6L since admission, worsening of Cr  Hyperkalemia, Resolved   P:   Follow urine output and Cr. 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 P:   PPI NPO  May need to begin TFs   HEMATOLOGIC  Recent Labs  11/17/15 0813 11/18/15 0500  HGB 7.8* 7.3*    A:   Anemia of chronic and critical illness DVT protection P:  SCDs  Transfuse per protocol  INFECTIOUS Blood Cx 10/28: NGTD BAL Culture 10/31: NGTD Pneumocystis Smear 10/31: Positive    A:   PCP HIV ?HCAP Trush   P:   Per ID consult Bactrim (10/20 >> ), Unasyn (10/29 >> ) and Azithro (10/26 >> ) Steroids Fluconazole (11/2 >> )  ARVs held starting 10/28 with concern for IRIS    ENDOCRINE A:   DM exacerbated by steroids for Crohn's  P:   SSI  FAMILY  - Updates: Family updated bedside 11/2  Phill Myron, D.O. 11/19/2015, 6:54 AM PGY-2, [place]

## 2015-11-20 ENCOUNTER — Inpatient Hospital Stay (HOSPITAL_COMMUNITY): Payer: BC Managed Care – PPO

## 2015-11-20 DIAGNOSIS — J80 Acute respiratory distress syndrome: Secondary | ICD-10-CM

## 2015-11-20 LAB — GLUCOSE, CAPILLARY
GLUCOSE-CAPILLARY: 125 mg/dL — AB (ref 65–99)
GLUCOSE-CAPILLARY: 137 mg/dL — AB (ref 65–99)
GLUCOSE-CAPILLARY: 138 mg/dL — AB (ref 65–99)
GLUCOSE-CAPILLARY: 151 mg/dL — AB (ref 65–99)
GLUCOSE-CAPILLARY: 170 mg/dL — AB (ref 65–99)
GLUCOSE-CAPILLARY: 176 mg/dL — AB (ref 65–99)
GLUCOSE-CAPILLARY: 201 mg/dL — AB (ref 65–99)
GLUCOSE-CAPILLARY: 247 mg/dL — AB (ref 65–99)
GLUCOSE-CAPILLARY: 253 mg/dL — AB (ref 65–99)
GLUCOSE-CAPILLARY: 260 mg/dL — AB (ref 65–99)
Glucose-Capillary: 273 mg/dL — ABNORMAL HIGH (ref 65–99)
Glucose-Capillary: 228 mg/dL — ABNORMAL HIGH (ref 65–99)
Glucose-Capillary: 159 mg/dL — ABNORMAL HIGH (ref 65–99)
Glucose-Capillary: 160 mg/dL — ABNORMAL HIGH (ref 65–99)
Glucose-Capillary: 181 mg/dL — ABNORMAL HIGH (ref 65–99)
Glucose-Capillary: 150 mg/dL — ABNORMAL HIGH (ref 65–99)
Glucose-Capillary: 200 mg/dL — ABNORMAL HIGH (ref 65–99)
Glucose-Capillary: 150 mg/dL — ABNORMAL HIGH (ref 65–99)

## 2015-11-20 LAB — BLOOD GAS, ARTERIAL
ACID-BASE DEFICIT: 5.5 mmol/L — AB (ref 0.0–2.0)
ACID-BASE DEFICIT: 4.4 mmol/L — AB (ref 0.0–2.0)
Acid-base deficit: 6.4 mmol/L — ABNORMAL HIGH (ref 0.0–2.0)
Acid-base deficit: 5.3 mmol/L — ABNORMAL HIGH (ref 0.0–2.0)
Acid-base deficit: 6.5 mmol/L — ABNORMAL HIGH (ref 0.0–2.0)
BICARBONATE: 18.9 mmol/L — AB (ref 20.0–28.0)
BICARBONATE: 20.5 mmol/L (ref 20.0–28.0)
BICARBONATE: 20.8 mmol/L (ref 20.0–28.0)
BICARBONATE: 19.7 mmol/L — AB (ref 20.0–28.0)
Bicarbonate: 20.8 mmol/L (ref 20.0–28.0)
DRAWN BY: 313941
DRAWN BY: 345601
Drawn by: 313941
Drawn by: 313941
Drawn by: 313941
FIO2: 60
FIO2: 70
FIO2: 60
FIO2: 60
FIO2: 60
LHR: 26 {breaths}/min
LHR: 30 {breaths}/min
LHR: 35 {breaths}/min
MECHVT: 440 mL
MECHVT: 440 mL
MECHVT: 380 mL
O2 SAT: 84.1 %
O2 Saturation: 84.7 %
O2 Saturation: 93.1 %
O2 Saturation: 87.4 %
O2 Saturation: 87.7 %
PATIENT TEMPERATURE: 98
PCO2 ART: 38.9 mmHg (ref 32.0–48.0)
PEEP/CPAP: 10 cmH2O
PEEP: 10 cmH2O
PEEP: 12 cmH2O
PEEP: 10 cmH2O
PEEP: 10 cmH2O
PH ART: 7.317 — AB (ref 7.350–7.450)
PO2 ART: 56.1 mmHg — AB (ref 83.0–108.0)
PO2 ART: 82.8 mmHg — AB (ref 83.0–108.0)
PO2 ART: 64.5 mmHg — AB (ref 83.0–108.0)
Patient temperature: 99
Patient temperature: 99.2
Patient temperature: 97.9
Patient temperature: 97.9
RATE: 26 resp/min
RATE: 35 resp/min
VT: 380 mL
VT: 380 mL
pCO2 arterial: 46.6 mmHg (ref 32.0–48.0)
pCO2 arterial: 51.4 mmHg — ABNORMAL HIGH (ref 32.0–48.0)
pCO2 arterial: 47.4 mmHg (ref 32.0–48.0)
pCO2 arterial: 41.7 mmHg (ref 32.0–48.0)
pH, Arterial: 7.305 — ABNORMAL LOW (ref 7.350–7.450)
pH, Arterial: 7.267 — ABNORMAL LOW (ref 7.350–7.450)
pH, Arterial: 7.232 — ABNORMAL LOW (ref 7.350–7.450)
pH, Arterial: 7.241 — ABNORMAL LOW (ref 7.350–7.450)
pO2, Arterial: 67.7 mmHg — ABNORMAL LOW (ref 83.0–108.0)
pO2, Arterial: 52.9 mmHg — ABNORMAL LOW (ref 83.0–108.0)

## 2015-11-20 LAB — CULTURE, BAL-QUANTITATIVE W GRAM STAIN: Culture: NO GROWTH

## 2015-11-20 LAB — TYPE AND SCREEN
ABO/RH(D): A POS
Antibody Screen: NEGATIVE
UNIT DIVISION: 0

## 2015-11-20 LAB — BASIC METABOLIC PANEL
Anion gap: 13 (ref 5–15)
BUN: 81 mg/dL — AB (ref 6–20)
CHLORIDE: 100 mmol/L — AB (ref 101–111)
CO2: 20 mmol/L — ABNORMAL LOW (ref 22–32)
CREATININE: 2.9 mg/dL — AB (ref 0.44–1.00)
Calcium: 7.6 mg/dL — ABNORMAL LOW (ref 8.9–10.3)
GFR calc Af Amer: 20 mL/min — ABNORMAL LOW (ref >60)
GFR calc non Af Amer: 17 mL/min — ABNORMAL LOW (ref >60)
Glucose, Bld: 275 mg/dL — ABNORMAL HIGH (ref 65–99)
Potassium: 3.3 mmol/L — ABNORMAL LOW (ref 3.5–5.1)
SODIUM: 133 mmol/L — AB (ref 135–145)

## 2015-11-20 LAB — PHOSPHORUS: Phosphorus: 5.8 mg/dL — ABNORMAL HIGH (ref 2.5–4.6)

## 2015-11-20 LAB — CULTURE, BAL-QUANTITATIVE

## 2015-11-20 LAB — CBC
HCT: 22.8 % — ABNORMAL LOW (ref 36.0–46.0)
Hemoglobin: 7.7 g/dL — ABNORMAL LOW (ref 12.0–15.0)
MCH: 29.3 pg (ref 26.0–34.0)
MCHC: 33.8 g/dL (ref 30.0–36.0)
MCV: 86.7 fL (ref 78.0–100.0)
PLATELETS: 76 10*3/uL — AB (ref 150–400)
RBC: 2.63 MIL/uL — ABNORMAL LOW (ref 3.87–5.11)
RDW: 17 % — AB (ref 11.5–15.5)
WBC: 5.4 10*3/uL (ref 4.0–10.5)

## 2015-11-20 LAB — MAGNESIUM: Magnesium: 2.4 mg/dL (ref 1.7–2.4)

## 2015-11-20 MED ORDER — MIDAZOLAM HCL 2 MG/2ML IJ SOLN
2.0000 mg | Freq: Once | INTRAMUSCULAR | Status: AC
  Administered 2015-11-20: 2 mg via INTRAVENOUS

## 2015-11-20 MED ORDER — CISATRACURIUM BESYLATE (PF) 200 MG/20ML IV SOLN
3.0000 ug/kg/min | INTRAVENOUS | Status: DC
  Administered 2015-11-20: 3 ug/kg/min via INTRAVENOUS
  Administered 2015-11-21: 2 ug/kg/min via INTRAVENOUS
  Administered 2015-11-21 – 2015-11-22 (×2): 1.5 ug/kg/min via INTRAVENOUS
  Filled 2015-11-20 (×4): qty 20

## 2015-11-20 MED ORDER — SODIUM CHLORIDE 0.9 % IV SOLN
2.0000 mg/h | INTRAVENOUS | Status: DC
  Administered 2015-11-20 (×2): 2 mg/h via INTRAVENOUS
  Administered 2015-11-20 – 2015-11-25 (×21): 10 mg/h via INTRAVENOUS
  Administered 2015-11-25: 4 mg/h via INTRAVENOUS
  Administered 2015-11-26 (×4): 10 mg/h via INTRAVENOUS
  Administered 2015-11-27: 8 mg/h via INTRAVENOUS
  Filled 2015-11-20 (×7): qty 10
  Filled 2015-11-20: qty 20
  Filled 2015-11-20: qty 10
  Filled 2015-11-20 (×2): qty 20
  Filled 2015-11-20 (×2): qty 10
  Filled 2015-11-20: qty 20
  Filled 2015-11-20 (×15): qty 10

## 2015-11-20 MED ORDER — MIDAZOLAM HCL 2 MG/2ML IJ SOLN: 2.0000 mg | Freq: Once | INTRAMUSCULAR | Status: DC | PRN

## 2015-11-20 MED ORDER — SODIUM CHLORIDE 0.9 % IV SOLN
INTRAVENOUS | Status: DC
  Administered 2015-11-20: 1.9 [IU]/h via INTRAVENOUS
  Filled 2015-11-20: qty 2.5

## 2015-11-20 MED ORDER — FENTANYL BOLUS VIA INFUSION
50.0000 ug | INTRAVENOUS | Status: DC | PRN
  Administered 2015-11-24: 50 ug via INTRAVENOUS
  Filled 2015-11-20: qty 50

## 2015-11-20 MED ORDER — MIDAZOLAM BOLUS VIA INFUSION
2.0000 mg | INTRAVENOUS | Status: DC | PRN
  Administered 2015-11-24: 2 mg via INTRAVENOUS
  Filled 2015-11-20: qty 2

## 2015-11-20 MED ORDER — FENTANYL CITRATE (PF) 100 MCG/2ML IJ SOLN: 100.0000 ug | Freq: Once | INTRAMUSCULAR | Status: AC

## 2015-11-20 MED ORDER — FENTANYL 2500MCG IN NS 250ML (10MCG/ML) PREMIX INFUSION
100.0000 ug/h | INTRAVENOUS | Status: DC
  Administered 2015-11-20 – 2015-11-23 (×9): 300 ug/h via INTRAVENOUS
  Administered 2015-11-24: 400 ug/h via INTRAVENOUS
  Administered 2015-11-24: 300 ug/h via INTRAVENOUS
  Administered 2015-11-24 – 2015-11-26 (×7): 400 ug/h via INTRAVENOUS
  Administered 2015-11-26: 350 ug/h via INTRAVENOUS
  Administered 2015-11-26: 400 ug/h via INTRAVENOUS
  Administered 2015-11-27: 300 ug/h via INTRAVENOUS
  Filled 2015-11-20 (×24): qty 250

## 2015-11-20 MED ORDER — STERILE WATER FOR INJECTION IV SOLN
INTRAVENOUS | Status: DC
  Administered 2015-11-20 – 2015-11-22 (×3): via INTRAVENOUS
  Filled 2015-11-20 (×6): qty 850

## 2015-11-20 MED ORDER — POTASSIUM CHLORIDE 20 MEQ/15ML (10%) PO SOLN
40.0000 meq | Freq: Once | ORAL | Status: AC
  Administered 2015-11-20: 40 meq
  Filled 2015-11-20: qty 30

## 2015-11-20 MED ORDER — ARTIFICIAL TEARS OP OINT
1.0000 "application " | TOPICAL_OINTMENT | Freq: Three times a day (TID) | OPHTHALMIC | Status: DC
  Administered 2015-11-20 – 2015-11-23 (×9): 1 via OPHTHALMIC
  Filled 2015-11-20: qty 3.5

## 2015-11-20 MED ORDER — CISATRACURIUM BOLUS VIA INFUSION
0.1000 mg/kg | Freq: Once | INTRAVENOUS | Status: AC
  Administered 2015-11-20: 8.4 mg via INTRAVENOUS
  Filled 2015-11-20: qty 9

## 2015-11-20 MED ORDER — FENTANYL CITRATE (PF) 100 MCG/2ML IJ SOLN: 100.0000 ug | Freq: Once | INTRAMUSCULAR | Status: DC | PRN

## 2015-11-20 MED ORDER — SODIUM CHLORIDE 0.9 % IV SOLN: INTRAVENOUS | Status: DC | PRN

## 2015-11-20 NOTE — Progress Notes (Signed)
[place] [place] Progress Note Patient Name: Nancy Acevedo DOB: 1960-02-02 MRN: [id number]   Date of Service  11/20/2015  HPI/Events of Note    eICU Interventions  Potassium replaced     Intervention Category Minor Interventions: Electrolytes abnormality - evaluation and management  Medina Degraffenreid S. 11/20/2015, 5:12 AM

## 2015-11-20 NOTE — Progress Notes (Signed)
eLink Physician-Brief Progress Note Patient Name: Louine Schalk DOB: [birth year] MRN: [id number]   Date of Service  11/20/2015  HPI/Events of Note  Persistent hyperglycemia on SSI. ICU protocol phase 2 initiated  eICU Interventions       Intervention Category Intermediate Interventions: Hyperglycemia - evaluation and treatment  Macarena Langseth S. 11/20/2015, 3:31 AM

## 2015-11-20 NOTE — Progress Notes (Signed)
PULMONARY / CRITICAL CARE MEDICINE   Name: Nancy Acevedo MRN: [id number] DOB: 04/30/1960    ADMISSION DATE:  11/08/2015 CONSULTATION DATE:  10/20  REFERRING MD:  [facility]ad  CHIEF COMPLAINT: Can't breathe  Brief:  55 Y/O with hypertension, chronic kidney disease stage II, OSA, GERD, and recent diagnosis of Crohn's colitis managed with prednisone Admittedwith resp distress, fevers, B/L infitrates.  She was diagnosed with HIV, AIDS CD4 count <4. Presumed PCP vs bacterial PNA. Getting treatment with Abx, bactrim and steroids. Also on HAART therapy as per ID  LINES/TUBES: PICC 10/15 >>  ETT 10/31 >>  Foley 11/1 >>  NG/OT 11/1 >>   Events 10/22/2015  -admit  10/14 - fever resolved 11/02/15 - fever back 10/17 -- MRSA Pcr negative 11/06/15- ICU tx 11/07/15 - HIV +ve from10/20/17, RVP  Negative, C Diff negative .STart bipap and lasix. ID consult -> Rx PCP (DO NOT DISCLOSE TO FAMILY). AUTOIMMUNE ORDERED 10/29- Re consult for ongoing hypoxia 10/31 Increasing oxygen demands on 100% BiPap, unable to transition to [facility]; Intubated & BAL perfomed in right middle lobe  11/1: Family meeting where diagnosis disclosed to [name] and mother  11/2: Received 1u PRBC for HgB <7. Started TF.   SUBJECTIVE/OVERNIGHT/INTERVAL HX Started on insulin gtt for persistent elevated CBGs despite increase in Novolog. Desaturations with weaning so Propofol restarted. Responsive this morning.    VITAL SIGNS: BP 126/81   Pulse 99   Temp 98.5 F (36.9 C) (Oral)   Resp (!) 32   Ht 5\' 4"  (1.626 m)   Wt 184 lb 11.9 oz (83.8 kg)   SpO2 93%   BMI 31.71 kg/m   HEMODYNAMICS:    VENTILATOR SETTINGS: Vent Mode: PRVC FiO2 (%):  [40 %-60 %] 60 % Set Rate:  [26 bmp] 26 bmp Vt Set:  [440 mL] 440 mL PEEP:  [8 cmH20-10 cmH20] 10 cmH20 Plateau Pressure:  [21 cmH20-25 cmH20] 21 cmH20  INTAKE / OUTPUT: I/O last 3 completed shifts: In: 3160.9 [I.V.:927.6; Blood:333.3; NG/GT:280; IV Piggyback:1620] Out: 1425  [Urine:1425]  PHYSICAL EXAMINATION: General:  Sleepy in NAD  Neuro: Responsive to tactile stimuli and voice. Will wiggle toes on command.  HEENT: ETT in place. Eyes open intermittently with equal pupils.  Cardiovascular:  RRR, Nl S1/S2, No MRG. Lungs:  Scattered crackles, rhonchi. Abdomen:  soft +bs, non-distended  Skin: warm and dry  LABS:  PULMONARY  Recent Labs Lab 11/15/15 0915 11/16/15 1140 11/17/15 0940 11/17/15 1740 11/18/15 0430  PHART 7.423 7.365 7.372 7.245* 7.307*  PCO2ART 27.5* 30.6* 31.8* 45.9 39.4  PO2ART 56.4* 65.8* 104 126.0* 86.9  HCO3 17.6* 17.1* 18.0* 19.9* 19.1*  TCO2  --   --   --  21  --   O2SAT 84.1 88.9 96.2 98.0 95.0   CBC  Recent Labs Lab 11/18/15 0500 11/19/15 0659 11/19/15 1515 11/20/15 0405  HGB 7.3* 6.5* 7.9* 7.7*  HCT 22.2* 19.1* 23.9* 22.8*  WBC 7.0 4.5  --  5.4  PLT 93* 76*  --  76*   COAGULATION No results for input(s): INR in the last 168 hours.  CARDIAC  No results for input(s): TROPONINI in the last 168 hours. No results for input(s): PROBNP in the last 168 hours.  CHEMISTRY  Recent Labs Lab 11/17/15 0813 11/18/15 0500 11/18/15 1520 11/19/15 0659 11/20/15 0405  NA 128* 128* 129* 132* 133*  K 5.2* 6.0* 5.7* 4.6 3.3*  CL 97* 98* 98* 100* 100*  CO2 20* 20* 18* 19* 20*  GLUCOSE 179* 201*  150* 179* 275*  BUN 47* 49* 52* 64* 81*  CREATININE 2.36* 2.46* 2.64* 3.19* 2.90*  CALCIUM 8.2* 8.2* 8.2* 7.9* 7.6*  MG  --  2.6*  --   --  2.4  PHOS  --  5.6*  --   --  5.8*   Estimated Creatinine Clearance: 23.2 mL/min (by C-G formula based on SCr of 2.9 mg/dL (H)).  LIVER  Recent Labs Lab 11/16/15 0419 11/17/15 0813  AST 36 29  ALT 14 13*  ALKPHOS 154* 162*  BILITOT 0.4 0.4  PROT 5.3* 5.3*  ALBUMIN 2.1* 2.2*   INFECTIOUS No results for input(s): LATICACIDVEN, PROCALCITON in the last 168 hours.  ENDOCRINE CBG (last 3)   Recent Labs  11/20/15 0319 11/20/15 0502 11/20/15 0602  GLUCAP 273* 253* 260*    IMAGING x48h  - image(s) personally visualized  -   highlighted in bold Dg Chest Port 1 View  Result Date: 11/19/2015 CLINICAL DATA:  Intubated patient, respiratory failure. EXAM: PORTABLE CHEST 1 VIEW COMPARISON:  Portable chest x-ray of November 18, 2015 FINDINGS: The lungs are adequately inflated. The interstitial markings are increased and more confluent than on yesterday's study. The heart is normal in size. The pulmonary vascularity is indistinct. There is no pleural effusion or pneumothorax. The endotracheal tube tip projects 4.6 cm above the carina. The esophagogastric tube tip in proximal port project below the inferior margin of the image. The right-sided PICC line tip projects over the midportion of the SVC. IMPRESSION: Worsening of interstitial and alveolar opacities consistent with progressive pneumonia or less likely pulmonary edema. The support tubes are in stable position. Electronically Signed   By: David  Martinique M.D.   On: 11/19/2015 10:28     DISCUSSION: 55 yo with PMH of recent dx of Crohn's and reported chest pain, left leg pain and increasing SOB x 3 months. She was admitted 10/13 and treated for gastic infections associated with her Crohn's  Disease. Recent diagnosis of HIV, AIDS with B.L pulmonary infiltrates. Being empirically treated for PCP vs PNA. PCCM reconsulted for ongoing hypoxia.  CXR  10/30 looks slightly improved, but has increased oxygen demands/needs 10/31  as FiO2 has been increased to 100% with PO2 of 104 per ABG.Failed several attempts at high flow oxygen 10/30 and 10/31. Patient subsequently intubated on 10/31 and bronchoscopy was performed.    ASSESSMENT / PLAN:  PULMONARY A: Admit:  Acute hypoxic resp failure with infiltrates  Current:  PCP Pneumonia  ARDS due to Alveolar Hemorrhage  On 60% FiO2 with PEEP of 10.   P:   D/c diuresis due to worsening of Cr.  Continue Abx per ID Steroids for PCP   CARDIOVASCULAR A:  Hx of HTN CC 1/17 with  no CAD Hx of Syncope Currently Hypotensive  P:  D/c Lasix  Daily CXR while intubated   RENAL A:   CRI with AKI, mild improvement in Cr  Hypokalemia   P:   Follow urine output and Cr. 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 P:   PPI NPO  TFs initiated 11/2   HEMATOLOGIC  Recent Labs  11/19/15 1515 11/20/15 0405  HGB 7.9* 7.7*    A:   Anemia of chronic and critical illness DVT protection P:  SCDs  Transfuse per protocol S/p 1uPRBC 11/2 for Hgb 6.5 with good response   INFECTIOUS Blood Cx 10/28: NG (final) BAL  Culture 10/31: NGTD Pneumocystis Smear 10/31: Positive    A:   PCP HIV ?HCAP Trush  Concern for IRIS   P:   Per ID consult Bactrim (10/20 >> ), Unasyn (10/29 >> ) and Azithro (10/26 >> ) Steroids Fluconazole (11/2 >> )  ARVs held starting 10/28 with concern for IRIS    ENDOCRINE A:   DM exacerbated by steroids for Crohn's  P:   SSI  FAMILY  - Updates: Family updated bedside   Phill Myron, D.O. 11/20/2015, 6:31 AM PGY-2, [place]

## 2015-11-20 NOTE — Progress Notes (Signed)
Vent changes made per ABG per ARDS protocol.

## 2015-11-20 NOTE — Progress Notes (Signed)
CSW continues to follow for disposition and SNF placement at discharge.          Emiliano Dyer, LCSW [facility] ED/52M Clinical Social Worker [phone]

## 2015-11-20 NOTE — Progress Notes (Signed)
[place] [place] Progress Note Patient Name: Nancy Acevedo DOB: Sep 19, 1960 MRN: [id number]   Date of Service  11/20/2015  HPI/Events of Note  ABG on 60%/PRVC 35/TV 380/P 10 = 7.24/47.4/64.5/19.7.  eICU Interventions  Will order: 1. NaHCO3 IV infusion to run IV at 50 mL/hour.  2. ABG at 10 PM.     Intervention Category Major Interventions: Acid-Base disturbance - evaluation and management;Respiratory failure - evaluation and management  Lysle Dingwall 11/20/2015, 5:32 PM

## 2015-11-20 NOTE — Progress Notes (Signed)
[facility] for Infectious Disease   Reason for visit: Follow up on PCP pneumonia  Interval History: intubated yesterday, PCP confirmed positive; mother and one daughter now aware of HIV status. PEEP 10,. FiO2 at 60%.     Physical Exam: Constitutional:  Vitals:   11/20/15 1129 11/20/15 1130  BP:  135/84  Pulse:  (!) 113  Resp:  (!) 32  Temp: 99 F (37.2 C)   intubated, sedated HENT: + ET Respiratory: diffuse rhonchi Cardiovascular: tachy RR   Review of Systems: Unable to be assessed due to mental status  Lab Results  Component Value Date   WBC 5.4 11/20/2015   HGB 7.7 (L) 11/20/2015   HCT 22.8 (L) 11/20/2015   MCV 86.7 11/20/2015   PLT 76 (L) 11/20/2015    Lab Results  Component Value Date   CREATININE 2.90 (H) 11/20/2015   BUN 81 (H) 11/20/2015   NA 133 (L) 11/20/2015   K 3.3 (L) 11/20/2015   CL 100 (L) 11/20/2015   CO2 20 (L) 11/20/2015    Lab Results  Component Value Date   ALT 13 (L) 11/17/2015   AST 29 11/17/2015   ALKPHOS 162 (H) 11/17/2015     Microbiology: Recent Results (from the past 240 hour(s))  Culture, blood (Routine X 2) w Reflex to ID Panel     Status: None   Collection Time: 11/14/15 10:15 AM  Result Value Ref Range Status   Specimen Description BLOOD LEFT ASSIST CONTROL  Final   Special Requests BOTTLES DRAWN AEROBIC AND ANAEROBIC 5CC  Final   Culture NO GROWTH 5 DAYS  Final   Report Status 11/19/2015 FINAL  Final  Culture, blood (Routine X 2) w Reflex to ID Panel     Status: None   Collection Time: 11/14/15 10:15 AM  Result Value Ref Range Status   Specimen Description BLOOD LEFT HAND  Final   Special Requests IN PEDIATRIC BOTTLE 3CC  Final   Culture NO GROWTH 5 DAYS  Final   Report Status 11/19/2015 FINAL  Final  Culture, bal-quantitative     Status: None   Collection Time: 11/17/15  4:28 PM  Result Value Ref Range Status   Specimen Description BRONCHIAL ALVEOLAR LAVAGE  Final   Special Requests NONE  Final   Gram  Stain   Final    MODERATE WBC PRESENT, PREDOMINANTLY PMN NO ORGANISMS SEEN    Culture NO GROWTH 2 DAYS  Final   Report Status 11/20/2015 FINAL  Final  Pneumocystis smear by DFA     Status: None   Collection Time: 11/17/15  4:28 PM  Result Value Ref Range Status   Specimen Source-[facility] BRONCHIAL ALVEOLAR LAVAGE  Final   Pneumocystis jiroveci Ag POSITIVE  Final    Comment: Performed at [facility], READ BACK BY AND VERIFIED WITH: JENNIFER CLIFTON RN BY LM AT [place] ON 11/18/15   Fungus Culture With Stain     Status: None (Preliminary result)   Collection Time: 11/17/15  4:29 PM  Result Value Ref Range Status   Fungus Stain Final report  Final    Comment: (NOTE) Performed At: [facility] [place], [place] [id number] Lindon Romp MD [id number]    Fungus (Mycology) Culture PENDING  Incomplete   Fungal Source BRONCHIAL ALVEOLAR LAVAGE  Final  Fungus Culture Result     Status: None   Collection Time: 11/17/15  4:29 PM  Result Value Ref Range Status  Result 1 Comment  Final    Comment: (NOTE) KOH/Calcofluor preparation:  no fungus observed. Performed At: [facility] [place], [place] [id number] Lindon Romp MD [id number]     Impression/Plan:  1. PCP pneumonia - on bactrim and steroids and will continue.  Creat has improved some with discontinuation of lasix.   2. HIVAIDS - viral load down but with worsening respiratory status, ARVs being held with concern for IRIS.  Mother and one daughter aware of diagnosis.  Others not aware.    Dr. Baxter Flattery available over the weekend if needed, I will follow up on Monday.

## 2015-11-20 NOTE — Procedures (Signed)
Arterial Catheter Insertion Procedure Note Nancy Acevedo [id number] Apr 03, 1960  Procedure: Insertion of Arterial Catheter  Indications: Frequent blood sampling  Procedure Details Consent: Risks of procedure as well as the alternatives and risks of each were explained to the (patient/caregiver).  Consent for procedure obtained. Time Out: Verified patient identification, verified procedure, site/side was marked, verified correct patient position, special equipment/implants available, medications/allergies/relevent history reviewed, required imaging and test results available.  Performed  Maximum sterile technique was used including antiseptics, cap, gloves, gown, hand hygiene, mask and sheet. Skin prep: Chlorhexidine; local anesthetic administered 20 gauge catheter was inserted into left radial artery using the Seldinger technique.  Evaluation Blood flow good; BP tracing good. Complications: No apparent complications.   Myrtie Neither 11/20/2015

## 2015-11-21 ENCOUNTER — Inpatient Hospital Stay (HOSPITAL_COMMUNITY): Payer: BC Managed Care – PPO

## 2015-11-21 LAB — GLUCOSE, CAPILLARY
GLUCOSE-CAPILLARY: 152 mg/dL — AB (ref 65–99)
GLUCOSE-CAPILLARY: 179 mg/dL — AB (ref 65–99)
Glucose-Capillary: 225 mg/dL — ABNORMAL HIGH (ref 65–99)
Glucose-Capillary: 168 mg/dL — ABNORMAL HIGH (ref 65–99)
Glucose-Capillary: 233 mg/dL — ABNORMAL HIGH (ref 65–99)
Glucose-Capillary: 209 mg/dL — ABNORMAL HIGH (ref 65–99)

## 2015-11-21 LAB — POCT I-STAT 3, ART BLOOD GAS (G3+)
Acid-base deficit: 4 mmol/L — ABNORMAL HIGH (ref 0.0–2.0)
BICARBONATE: 22.3 mmol/L (ref 20.0–28.0)
O2 SAT: 92 %
PCO2 ART: 42.7 mmHg (ref 32.0–48.0)
Patient temperature: 98
TCO2: 24 mmol/L (ref 0–100)
pH, Arterial: 7.325 — ABNORMAL LOW (ref 7.350–7.450)
pO2, Arterial: 67 mmHg — ABNORMAL LOW (ref 83.0–108.0)

## 2015-11-21 LAB — BASIC METABOLIC PANEL
Anion gap: 10 (ref 5–15)
BUN: 89 mg/dL — AB (ref 6–20)
CALCIUM: 7.5 mg/dL — AB (ref 8.9–10.3)
CO2: 24 mmol/L (ref 22–32)
CREATININE: 2.16 mg/dL — AB (ref 0.44–1.00)
Chloride: 105 mmol/L (ref 101–111)
GFR calc Af Amer: 29 mL/min — ABNORMAL LOW (ref >60)
GFR, EST NON AFRICAN AMERICAN: 25 mL/min — AB (ref >60)
GLUCOSE: 205 mg/dL — AB (ref 65–99)
Potassium: 3.3 mmol/L — ABNORMAL LOW (ref 3.5–5.1)
SODIUM: 139 mmol/L (ref 135–145)

## 2015-11-21 LAB — CBC
HEMATOCRIT: 21.9 % — AB (ref 36.0–46.0)
Hemoglobin: 7.2 g/dL — ABNORMAL LOW (ref 12.0–15.0)
MCH: 29 pg (ref 26.0–34.0)
MCHC: 32.9 g/dL (ref 30.0–36.0)
MCV: 88.3 fL (ref 78.0–100.0)
PLATELETS: 49 10*3/uL — AB (ref 150–400)
RBC: 2.48 MIL/uL — ABNORMAL LOW (ref 3.87–5.11)
RDW: 17.3 % — AB (ref 11.5–15.5)
WBC: 4.2 10*3/uL (ref 4.0–10.5)

## 2015-11-21 LAB — MAGNESIUM: MAGNESIUM: 2.3 mg/dL (ref 1.7–2.4)

## 2015-11-21 LAB — PHOSPHORUS: PHOSPHORUS: 4.7 mg/dL — AB (ref 2.5–4.6)

## 2015-11-21 MED ORDER — INSULIN ASPART 100 UNIT/ML ~~LOC~~ SOLN
5.0000 [IU] | SUBCUTANEOUS | Status: DC
  Administered 2015-11-21 – 2015-11-24 (×9): 5 [IU] via SUBCUTANEOUS

## 2015-11-21 MED ORDER — INSULIN GLARGINE 100 UNIT/ML ~~LOC~~ SOLN
30.0000 [IU] | Freq: Every day | SUBCUTANEOUS | Status: DC
  Administered 2015-11-21 (×2): 30 [IU] via SUBCUTANEOUS
  Filled 2015-11-21 (×3): qty 0.3

## 2015-11-21 MED ORDER — INSULIN ASPART 100 UNIT/ML ~~LOC~~ SOLN
2.0000 [IU] | SUBCUTANEOUS | Status: DC
Start: 2015-11-21 — End: 2015-11-27
  Administered 2015-11-21 (×2): 6 [IU] via SUBCUTANEOUS
  Administered 2015-11-21 (×2): 4 [IU] via SUBCUTANEOUS
  Administered 2015-11-21: 6 [IU] via SUBCUTANEOUS
  Administered 2015-11-22: 2 [IU] via SUBCUTANEOUS
  Administered 2015-11-22 – 2015-11-24 (×6): 4 [IU] via SUBCUTANEOUS
  Administered 2015-11-24 (×3): 2 [IU] via SUBCUTANEOUS
  Administered 2015-11-25 (×2): 4 [IU] via SUBCUTANEOUS
  Administered 2015-11-25: 2 [IU] via SUBCUTANEOUS
  Administered 2015-11-25 (×3): 4 [IU] via SUBCUTANEOUS
  Administered 2015-11-26: 2 [IU] via SUBCUTANEOUS
  Administered 2015-11-27: 6 [IU] via SUBCUTANEOUS
  Administered 2015-11-27: 4 [IU] via SUBCUTANEOUS

## 2015-11-21 MED ORDER — INSULIN ASPART 100 UNIT/ML ~~LOC~~ SOLN: 0.0000 [IU] | SUBCUTANEOUS | Status: DC

## 2015-11-21 MED ORDER — DEXTROSE 10 % IV SOLN: INTRAVENOUS | Status: DC | PRN

## 2015-11-21 NOTE — Progress Notes (Signed)
RT note- Sp02 decreased to 87% post bathing, recruitment maneuver performed and peep placed back to +12, continue to monitor.

## 2015-11-21 NOTE — Progress Notes (Signed)
PULMONARY / CRITICAL CARE MEDICINE   Name: Nancy Acevedo MRN: [id number] DOB: [birth year]    ADMISSION DATE:  10/22/2015 CONSULTATION DATE:  10/20  REFERRING MD:  Triad  CHIEF COMPLAINT: Can't breathe  Brief:  55 Y/O with hypertension, chronic kidney disease stage II, OSA, GERD, and recent diagnosis of Crohn's colitis managed with prednisone Admittedwith resp distress, fevers, B/L infitrates.  She was diagnosed with HIV, AIDS CD4 count <4. Presumed PCP vs bacterial PNA. Getting treatment with Abx, bactrim and steroids. Also on HAART therapy as per ID  LINES/TUBES: PICC 10/15 >>  ETT 10/31 >>  Foley 11/1 >>  NG/OT 11/1 >>   Events 11/14/2015  -admit  10/14 - fever resolved 11/02/15 - fever back 10/17 -- MRSA Pcr negative 11/06/15- ICU tx 11/07/15 - HIV +ve from10/20/17, RVP  Negative, C Diff negative .STart bipap and lasix. ID consult -> Rx PCP (DO NOT DISCLOSE TO FAMILY). AUTOIMMUNE ORDERED 10/29- Re consult for ongoing hypoxia 10/31 Increasing oxygen demands on 100% BiPap, unable to transition to [facility]; Intubated & BAL perfomed in right middle lobe  11/1: Family meeting where diagnosis disclosed to [name] and mother  11/2: Received 1u PRBC for HgB <7. Started TF.  11/20/15 - started nimbex for severe ARDS   SUBJECTIVE/OVERNIGHT/INTERVAL HX 11/21/15 - severe ards persists 70% fio2, peep 12. Not on pressors.Making urine. Creat better. Mother at bedside with reverenc and her boyfriend John  VITAL SIGNS: BP (!) 140/92   Pulse 90   Temp 97.3 F (36.3 C) (Oral)   Resp (!) 35   Ht 5\' 4"  (1.626 m)   Wt 87.1 kg (192 lb 0.3 oz)   SpO2 99%   BMI 32.96 kg/m   HEMODYNAMICS:    VENTILATOR SETTINGS: Vent Mode: PRVC FiO2 (%):  [60 %-70 %] 70 % Set Rate:  [26 bmp-35 bmp] 35 bmp Vt Set:  [380 mL-440 mL] 380 mL PEEP:  [10 cmH20-12 cmH20] 12 cmH20 Plateau Pressure:  [24 cmH20-29 cmH20] 24 cmH20  INTAKE / OUTPUT: I/O last 3 completed shifts: In: 4736.3 [I.V.:2366.3;  NG/GT:620; IV Piggyback:1750] Out: 2905 [Urine:2055; Stool:850]  PHYSICAL EXAMINATION: General:  Critically ill lookig NeuroRASS -5/Bis 45 on fent/versed and nimbex d1 complete HEENT: ETT in place. Eyes open intermittently with equal pupils.  Cardiovascular:  RRR, Nl S1/S2, No MRG. Lungs:  Scattered crackles, rhonchi. Abdomen:  soft +bs, non-distended  Skin: warm and dry  LABS:  PULMONARY  Recent Labs Lab 11/17/15 1740  11/20/15 1335 11/20/15 1508 11/20/15 1640 11/20/15 2120 11/21/15 0329  PHART 7.245*  < > 7.267* 7.232* 7.241* 7.317* 7.325*  PCO2ART 45.9  < > 46.6 51.4* 47.4 41.7 42.7  PO2ART 126.0*  < > 82.8* 67.7* 64.5* 52.9* 67.0*  HCO3 19.9*  < > 20.5 20.8 19.7* 20.8 22.3  TCO2 21  --   --   --   --   --  24  O2SAT 98.0  < > 93.1 87.4 87.7 84.1 92.0  < > = values in this interval not displayed. CBC  Recent Labs Lab 11/19/15 0659 11/19/15 1515 11/20/15 0405 11/21/15 0435  HGB 6.5* 7.9* 7.7* 7.2*  HCT 19.1* 23.9* 22.8* 21.9*  WBC 4.5  --  5.4 4.2  PLT 76*  --  76* 49*   COAGULATION No results for input(s): INR in the last 168 hours.  CARDIAC  No results for input(s): TROPONINI in the last 168 hours. No results for input(s): PROBNP in the last 168 hours.  CHEMISTRY  Recent Labs Lab  11/18/15 0500 11/18/15 1520 11/19/15 0659 11/20/15 0405 11/21/15 0435  NA 128* 129* 132* 133* 139  K 6.0* 5.7* 4.6 3.3* 3.3*  CL 98* 98* 100* 100* 105  CO2 20* 18* 19* 20* 24  GLUCOSE 201* 150* 179* 275* 205*  BUN 49* 52* 64* 81* 89*  CREATININE 2.46* 2.64* 3.19* 2.90* 2.16*  CALCIUM 8.2* 8.2* 7.9* 7.6* 7.5*  MG 2.6*  --   --  2.4 2.3  PHOS 5.6*  --   --  5.8* 4.7*   Estimated Creatinine Clearance: 31.8 mL/min (by C-G formula based on SCr of 2.16 mg/dL (H)).  LIVER  Recent Labs Lab 11/16/15 0419 11/17/15 0813  AST 36 29  ALT 14 13*  ALKPHOS 154* 162*  BILITOT 0.4 0.4  PROT 5.3* 5.3*  ALBUMIN 2.1* 2.2*   INFECTIOUS No results for input(s):  LATICACIDVEN, PROCALCITON in the last 168 hours.  ENDOCRINE CBG (last 3)   Recent Labs  11/21/15 0035 11/21/15 0353 11/21/15 0800  GLUCAP 179* 225* 168*   IMAGING x48h  - image(s) personally visualized  -   highlighted in bold Dg Chest Port 1 View  Result Date: 11/21/2015 CLINICAL DATA:  Ventilator dependence EXAM: PORTABLE CHEST 1 VIEW COMPARISON:  11/20/2015 FINDINGS: Endotracheal tube, nasogastric catheter and right-sided PICC line are again seen and stable. Cardiac shadow is at the upper limits of normal in size. Diffuse bilateral infiltrates are again identified and unchanged. No new focal abnormality is seen. IMPRESSION: Stable appearance of the chest. Tubes and lines as described. Electronically Signed   By: Inez Catalina M.D.   On: 11/21/2015 07:16   Dg Chest Port 1 View  Result Date: 11/20/2015 CLINICAL DATA:  Intubation. EXAM: PORTABLE CHEST 1 VIEW COMPARISON:  11/19/2015. FINDINGS: Endotracheal tube, NG tube, right PICC line in stable position. Heart size stable. Diffuse bilateral pulmonary infiltrates unchanged. No prominent pleural effusion. No pneumothorax. IMPRESSION: 1. Lines and tubes in stable position. 2. Diffuse dense bilateral pulmonary infiltrates, unchanged from prior exam. Electronically Signed   By: Marcello Moores  Register   On: 11/20/2015 07:19     DISCUSSION: 55 yo with PMH of recent dx of Crohn's and reported chest pain, left leg pain and increasing SOB x 3 months. She was admitted 10/13 and treated for gastic infections associated with her Crohn's  Disease. Recent diagnosis of HIV, AIDS with B.L pulmonary infiltrates. Being empirically treated for PCP vs PNA. PCCM reconsulted for ongoing hypoxia.  CXR  10/30 looks slightly improved, but has increased oxygen demands/needs 10/31  as FiO2 has been increased to 100% with PO2 of 104 per ABG.Failed several attempts at high flow oxygen 10/30 and 10/31. Patient subsequently intubated on 10/31 and bronchoscopy was performed.     ASSESSMENT / PLAN:  PULMONARY A: Admit:  Acute hypoxic resp failure with infiltrates  Current:  PCP Pneumonia  ARDS due to Alveolar Hemorrhage possible got worse due to IRS  11/4 - On d1 nimbex complet but stil ith 7% FiO2 with PEEP of 12.   P:   ards net protocol on vent Continue nimbex Consider prone ventilation 11/22/15 if not better .  Continue Abx per ID Steroids for PCP   CARDIOVASCULAR A:  Hx of HTN CC 1/17 with no CAD   - normal bp/hr  P:  monitor  RENAL A:   CRI with AKI,    - mild improvement in Cr after lasix held 11/21/15   P:   Follow urine output and Cr. 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   - on TF at 10cc/h  P:   PPI NPO  TFs initiated 11/2   HEMATOLOGIC  Recent Labs  11/20/15 0405 11/21/15 0435  HGB 7.7* 7.2*    A:   Anemia of chronic and critical illness- S/p 1uPRBC 11/2 for Hgb 6.5 with good response  DVT protection P:  SCDs  Transfuse per protocol   INFECTIOUS Blood Cx 10/28: NG (final) BAL Culture 10/31: NGTD Pneumocystis Smear 10/31: Positive - Concern for IRIS s reaspn for intubation   A:   PCP HIV/AIDS Trush  IRIS    P:   Per ID consult Bactrim (10/20 >> ), Unasyn (10/29 >> ) and Azithro (10/26 >> ) Steroids Fluconazole (11/2 >> )  ARVs held starting 10/28 with concern for IRIS    ENDOCRINE A:   DM exacerbated by steroids for Crohn's  P:   SSI  FAMILY  - Updates: Family mom and rfevered and friend Jenny Reichmann updated bedside about ARDS  GLOBAL  only mom and daughter Audery Amel know HIV diagnosis. Rest should not    .  Rest per NP/medical resident whose note is outlined above and that I agree with  The patient is critically ill with multiple organ systems failure and requires high complexity decision making for assessment and support, frequent evaluation and titration of therapies, application  of advanced monitoring technologies and extensive interpretation of multiple databases.   Critical Care Time devoted to patient care services described in this note is  30  Minutes. This time reflects time of care of this signee Dr Brand Males. This critical care time does not reflect procedure time, or teaching time or supervisory time of PA/NP/Med student/Med Resident etc but could involve care discussion time    Dr. Brand Males, M.D., [facility].C.P Pulmonary and Critical Care Medicine Staff Physician [place] [facility] Pager: [phone], If no answer or between  15:00h - 7:00h: call [phone]  11/21/2015 10:55 AM

## 2015-11-22 ENCOUNTER — Inpatient Hospital Stay (HOSPITAL_COMMUNITY): Payer: BC Managed Care – PPO

## 2015-11-22 LAB — BASIC METABOLIC PANEL
Anion gap: 12 (ref 5–15)
BUN: 84 mg/dL — AB (ref 6–20)
CALCIUM: 7.5 mg/dL — AB (ref 8.9–10.3)
CO2: 26 mmol/L (ref 22–32)
CREATININE: 1.55 mg/dL — AB (ref 0.44–1.00)
Chloride: 106 mmol/L (ref 101–111)
GFR calc Af Amer: 43 mL/min — ABNORMAL LOW (ref >60)
GFR, EST NON AFRICAN AMERICAN: 37 mL/min — AB (ref >60)
GLUCOSE: 125 mg/dL — AB (ref 65–99)
Potassium: 2.5 mmol/L — CL (ref 3.5–5.1)
SODIUM: 144 mmol/L (ref 135–145)

## 2015-11-22 LAB — GLUCOSE, CAPILLARY
GLUCOSE-CAPILLARY: 151 mg/dL — AB (ref 65–99)
GLUCOSE-CAPILLARY: 170 mg/dL — AB (ref 65–99)
GLUCOSE-CAPILLARY: 177 mg/dL — AB (ref 65–99)
GLUCOSE-CAPILLARY: 60 mg/dL — AB (ref 65–99)
GLUCOSE-CAPILLARY: 70 mg/dL (ref 65–99)
GLUCOSE-CAPILLARY: 98 mg/dL (ref 65–99)
Glucose-Capillary: 140 mg/dL — ABNORMAL HIGH (ref 65–99)
Glucose-Capillary: 88 mg/dL (ref 65–99)

## 2015-11-22 LAB — CBC
HCT: 22.9 % — ABNORMAL LOW (ref 36.0–46.0)
Hemoglobin: 7.6 g/dL — ABNORMAL LOW (ref 12.0–15.0)
MCH: 29 pg (ref 26.0–34.0)
MCHC: 33.2 g/dL (ref 30.0–36.0)
MCV: 87.4 fL (ref 78.0–100.0)
PLATELETS: 43 10*3/uL — AB (ref 150–400)
RBC: 2.62 MIL/uL — ABNORMAL LOW (ref 3.87–5.11)
RDW: 17.3 % — AB (ref 11.5–15.5)
WBC: 4.2 10*3/uL (ref 4.0–10.5)

## 2015-11-22 LAB — PHOSPHORUS: PHOSPHORUS: 3.6 mg/dL (ref 2.5–4.6)

## 2015-11-22 LAB — MAGNESIUM: Magnesium: 2.3 mg/dL (ref 1.7–2.4)

## 2015-11-22 MED ORDER — INSULIN GLARGINE 100 UNIT/ML ~~LOC~~ SOLN
20.0000 [IU] | Freq: Every day | SUBCUTANEOUS | Status: DC
  Administered 2015-11-22: 20 [IU] via SUBCUTANEOUS
  Filled 2015-11-22 (×3): qty 0.2

## 2015-11-22 MED ORDER — DEXTROSE 50 % IV SOLN
25.0000 mL | Freq: Once | INTRAVENOUS | Status: AC
  Administered 2015-11-22: 25 mL via INTRAVENOUS
  Filled 2015-11-22: qty 50

## 2015-11-22 MED ORDER — SODIUM CHLORIDE 0.9 % IV SOLN
30.0000 meq | Freq: Once | INTRAVENOUS | Status: AC
  Administered 2015-11-22: 30 meq via INTRAVENOUS
  Filled 2015-11-22: qty 15

## 2015-11-22 MED ORDER — POTASSIUM CHLORIDE 20 MEQ/15ML (10%) PO SOLN
30.0000 meq | ORAL | Status: AC
  Administered 2015-11-22 (×2): 30 meq
  Filled 2015-11-22 (×2): qty 30

## 2015-11-22 MED ORDER — AMPICILLIN-SULBACTAM SODIUM 3 (2-1) G IJ SOLR
3.0000 g | Freq: Three times a day (TID) | INTRAMUSCULAR | Status: DC
  Filled 2015-11-22 (×2): qty 3

## 2015-11-22 MED ORDER — DEXTROSE 50 % IV SOLN
INTRAVENOUS | Status: AC
  Filled 2015-11-22: qty 50

## 2015-11-22 MED ORDER — WHITE PETROLATUM GEL
Status: AC
  Administered 2015-11-22: 16:00:00
  Filled 2015-11-22: qty 1

## 2015-11-22 NOTE — Progress Notes (Signed)
[place] Progress Note Patient Name: Nancy Acevedo DOB: [birth year] MRN: [id number]   Date of Service  11/22/2015  HPI/Events of Note  Notified by bedside nurse. Potassium 2.5. Creatinine 1.55 & magnesium 2.3. Has enteric and central IV access.   eICU Interventions  1. KCl 30 mEq IV 2. KCl 30 mEq via tube 2 doses      Intervention Category Intermediate Interventions: Electrolyte abnormality - evaluation and management  Tera Partridge 11/22/2015, 5:11 AM

## 2015-11-22 NOTE — Progress Notes (Signed)
RT note-Patient will be prone with all precautions met.

## 2015-11-22 NOTE — Consult Note (Signed)
[facility] Nurse wound consult note Reason for Consult: Critically ill patient with moisture associated skin damage (2 areas) in the apex of the gluteal cleft, presenting as partial thickness skin loss.  FlexiSeal BMS in place, minor peritube leakage. Patient is being pronated for pulmonary status; recently completed 18 hours of this therapy. Sacrum and heels are  intact. Wound type: Moisture Pressure Ulcer POA: No Measurement: Proximal area measures 6cm x 2.5cm x 0.1cm.  Distal area measures 2cm x 3cm x 0.1cm. Wound bed: Red, moist (100%) Drainage (amount, consistency, odor) scant serous Periwound: intact Dressing procedure/placement/frequency: I will recommend covering these areas with a silicone foam dressing.  Additionally, I will provide guidance for the Nursing team via the Orders for Prevalon Boots for pressure redistribution and silicone foam dressing placement to areas of bony prominences and other areas of concern (e.g., face, breasts) during pronation. [facility] nursing team will not follow, but will remain available to this patient, the nursing and medical teams.  Please re-consult if needed. Thanks, Maudie Flakes, MSN, RN, [place], Arther Abbott  Pager# [phone]

## 2015-11-22 NOTE — Progress Notes (Signed)
RT note- patient was successfully placed into prone position, and recruited, changed fio2 80%.

## 2015-11-22 NOTE — Progress Notes (Signed)
  Hypoglycemic Event  CBG: 61  Treatment: D50 IV 25 mL  Symptoms: None  Follow-up CBG: [id number] CBG Result: 98  Possible Reasons for Event: Unknown  Comments/MD notified: Dr. Chase Caller notified     Phill Myron D'Adamo

## 2015-11-22 NOTE — Progress Notes (Signed)
Critical Potassium of 2.5 called to Dr Ashok Cordia. Orders placed

## 2015-11-22 NOTE — Progress Notes (Addendum)
Pharmacy Antibiotic Note  Nancy Acevedo is a 55 y.o. female admitted on 11/05/2015 with new HIV diagnosis, PCP pneumonia and concern for possible aspiration pneumonia.  Pharmacy has been consulted for sulfamethoxazole-trimethoprim dosing. Her renal function has been improving, current SCr 1.55, estimated CrCl ~45 ml/min.   Patient has also has low platelets, this morning was 43 and few episodes of hypoglycemia. Discussed with Dr. Chase Caller and will discontinue Lovenox at this time due to concern for bleeding. Will also decrease Lantus to 20 units qHS to avoid hypoglycemia.  Plan: - Discontinue Unasyn - Continue sulfamethoxazole-trimethoprim 400 mg TMP IV q8h - Continue fluconazole 50 mg IV q24h - Monitor C&S, renal function and ability to resume ART - D/c Lovenox and monitor CBC - Decrease Lantus to 20 units qHS - Monitor CBGs  Height: '5\' 4"'$  (162.6 cm) Weight: 194 lb 10.7 oz (88.3 kg) IBW/kg (Calculated) : 54.7  Temp (24hrs), Avg:97.2 F (36.2 C), Min:96.3 F (35.7 C), Max:97.5 F (36.4 C)   Recent Labs Lab 11/18/15 0500 11/18/15 1520 11/19/15 0659 11/20/15 0405 11/21/15 0435 11/22/15 0440  WBC 7.0  --  4.5 5.4 4.2 4.2  CREATININE 2.46* 2.64* 3.19* 2.90* 2.16* 1.55*    Estimated Creatinine Clearance: 44.6 mL/min (by C-G formula based on SCr of 1.55 mg/dL (H)).    Allergies  Allergen Reactions  . Lexapro [Escitalopram] Itching  . Losartan   . Spironolactone     rash  . Clonidine Derivatives Palpitations   Antimicrobials this admission:  Vanco 10/17 x1 (750 mg); 10/19 x 1 (1g) Zosyn 10/17 >>10/22 Cipro 10/13 >> 10/16 Flagyl 10/13 >> 10/16 Bactrim 10/20 (switched to po on 10/26; changed back to IV 10/30) >> Unasyn 10/29 >> 11/5 Azithromycin 10/26 weekly >> Fluconazole 11/2 >>   Microbiology results:  10/14 Cdiff >> negative 10/16 Quantiferon Gold >> indeterminate so PPD placed 10/17 - read as negative 10/17 BCx: neg 10/17 MRSA PCR: neg 10/19 BCx2:  neg 10/19 Cdif: neg x2 10/20 HIV: pos 10/21 HIV RNA: ip 10/21 CD4: <10 10/21 HLA: neg 10/21 Resp PCR: neg 10/28 BCx: ngtd - final  10/31 BAL (fungus): pending  10/31 BAL (AFB): pending  10/31 BAL cx: ngtd  10/31 BAL (pneumocystis): positive   Thank you for allowing pharmacy to be a part of this patient's care.  Dimitri Ped, PharmD. PGY-2 Infectious Diseases Pharmacy Resident Pager: [phone] 11/22/2015 9:24 AM

## 2015-11-22 NOTE — Progress Notes (Signed)
RT note-head turned ever two hours since prone position.

## 2015-11-22 NOTE — Progress Notes (Signed)
[place] Progress Note Patient Name: Nancy Acevedo DOB: [birth year] MRN: [id number]   Date of Service  11/22/2015  HPI/Events of Note  Na+ = 144.  eICU Interventions  Will D/C NaCl tablets.      Intervention Category Major Interventions: Electrolyte abnormality - evaluation and management  Sommer,Steven Eugene 11/22/2015, 5:19 PM

## 2015-11-22 NOTE — Progress Notes (Signed)
PULMONARY / CRITICAL CARE MEDICINE   Name: Nancy Acevedo MRN: [id number] DOB: 1960-03-23    ADMISSION DATE:  10/31/2015 CONSULTATION DATE:  10/20  REFERRING MD:  Triad  CHIEF COMPLAINT: Can't breathe  Brief:  55 Y/O with hypertension, chronic kidney disease stage II, OSA, GERD, and recent diagnosis of Crohn's colitis managed with prednisone Admittedwith resp distress, fevers, B/L infitrates.  She was diagnosed with HIV, AIDS CD4 count <4. Presumed PCP vs bacterial PNA. Getting treatment with Abx, bactrim and steroids. Also on HAART therapy as per ID  LINES/TUBES: PICC 10/15 >>  ETT 10/31 >>  Foley 11/1 >>  NG/OT 11/1 >>   Events 10/19/2015  -admit  10/14 - fever resolved 11/02/15 - fever back 10/17 -- MRSA Pcr negative 11/06/15- ICU tx 11/07/15 - HIV +ve from10/20/17, RVP  Negative, C Diff negative .STart bipap and lasix. ID consult -> Rx PCP (DO NOT DISCLOSE TO FAMILY). AUTOIMMUNE ORDERED 10/29- Re consult for ongoing hypoxia 10/31 Increasing oxygen demands on 100% BiPap, unable to transition to [facility]; Intubated & BAL perfomed in right middle lobe  11/1: Family meeting where diagnosis disclosed to [name] and mother  11/2: Received 1u PRBC for HgB <7. Started TF.  11/20/15 - started nimbex for severe ARDS 11/21/15 - severe ards persists 70% fio2, peep 12. Not on pressors.Making urine. Creat better. Mother at bedside with reverenc and her boyfriend Jenny Reichmann   SUBJECTIVE/OVERNIGHT/INTERVAL HX 11/22/15 - meets indication for prone ventilation at 70% fio2/peep 12 -> pulse ox 91%. Nimbex continues. AKi improving without lasix  VITAL SIGNS: BP 136/89   Pulse 83   Temp 97.2 F (36.2 C) (Oral)   Resp (!) 35   Ht 5\' 4"  (1.626 m)   Wt 88.3 kg (194 lb 10.7 oz)   SpO2 91% Comment: patient recruitment manuever with PC  BMI 33.41 kg/m   HEMODYNAMICS:    VENTILATOR SETTINGS: Vent Mode: PRVC FiO2 (%):  [60 %-70 %] 70 % Set Rate:  [35 bmp] 35 bmp Vt Set:  [380 mL] 380  mL PEEP:  [10 cmH20-12 cmH20] 12 cmH20 Plateau Pressure:  [21 cmH20-29 cmH20] 21 cmH20  INTAKE / OUTPUT: I/O last 3 completed shifts: In: 6310.2 [I.V.:3960.2; Other:130; NG/GT:430; IV Piggyback:1790] Out: 4200 [Urine:2800; Stool:1400]  PHYSICAL EXAMINATION: General:  Critically ill lookig NeuroRASS -5/Bis 45 on fent/versed and nimbex d2 complete on 11/22/2015 HEENT: ETT in place. Eyes open intermittently with equal pupils.  Cardiovascular:  RRR, Nl S1/S2, No MRG. Lungs:  Scattered crackles, rhonchi. Abdomen:  soft +bs, non-distended  Skin: warm and dry  LABS:  PULMONARY  Recent Labs Lab 11/17/15 1740  11/20/15 1335 11/20/15 1508 11/20/15 1640 11/20/15 2120 11/21/15 0329  PHART 7.245*  < > 7.267* 7.232* 7.241* 7.317* 7.325*  PCO2ART 45.9  < > 46.6 51.4* 47.4 41.7 42.7  PO2ART 126.0*  < > 82.8* 67.7* 64.5* 52.9* 67.0*  HCO3 19.9*  < > 20.5 20.8 19.7* 20.8 22.3  TCO2 21  --   --   --   --   --  24  O2SAT 98.0  < > 93.1 87.4 87.7 84.1 92.0  < > = values in this interval not displayed. CBC  Recent Labs Lab 11/20/15 0405 11/21/15 0435 11/22/15 0440  HGB 7.7* 7.2* 7.6*  HCT 22.8* 21.9* 22.9*  WBC 5.4 4.2 4.2  PLT 76* 49* 43*   COAGULATION No results for input(s): INR in the last 168 hours.  CARDIAC  No results for input(s): TROPONINI in the last 168 hours.  No results for input(s): PROBNP in the last 168 hours.  CHEMISTRY  Recent Labs Lab 11/18/15 0500 11/18/15 1520 11/19/15 0659 11/20/15 0405 11/21/15 0435 11/22/15 0440  NA 128* 129* 132* 133* 139 144  K 6.0* 5.7* 4.6 3.3* 3.3* 2.5*  CL 98* 98* 100* 100* 105 106  CO2 20* 18* 19* 20* 24 26  GLUCOSE 201* 150* 179* 275* 205* 125*  BUN 49* 52* 64* 81* 89* 84*  CREATININE 2.46* 2.64* 3.19* 2.90* 2.16* 1.55*  CALCIUM 8.2* 8.2* 7.9* 7.6* 7.5* 7.5*  MG 2.6*  --   --  2.4 2.3 2.3  PHOS 5.6*  --   --  5.8* 4.7* 3.6   Estimated Creatinine Clearance: 44.6 mL/min (by C-G formula based on SCr of 1.55 mg/dL  (H)).  LIVER  Recent Labs Lab 11/16/15 0419 11/17/15 0813  AST 36 29  ALT 14 13*  ALKPHOS 154* 162*  BILITOT 0.4 0.4  PROT 5.3* 5.3*  ALBUMIN 2.1* 2.2*   INFECTIOUS No results for input(s): LATICACIDVEN, PROCALCITON in the last 168 hours.  ENDOCRINE CBG (last 3)   Recent Labs  11/21/15 2340 11/22/15 0358 11/22/15 0751  GLUCAP 177* 140* 70   IMAGING x48h  - image(s) personally visualized  -   highlighted in bold Dg Chest Port 1 View  Result Date: 11/22/2015 CLINICAL DATA:  Ventilator dependent EXAM: PORTABLE CHEST 1 VIEW COMPARISON:  11/21/2015 FINDINGS: Endotracheal tube, nasogastric catheter and right-sided PICC line are again seen and stable. Cardiac shadow is at the upper limits of normal in size. Bilateral diffuse infiltrates are again identified and stable. No acute bony abnormality is noted. IMPRESSION: No change from the previous day. Electronically Signed   By: Inez Catalina M.D.   On: 11/22/2015 07:14   Dg Chest Port 1 View  Result Date: 11/21/2015 CLINICAL DATA:  Ventilator dependence EXAM: PORTABLE CHEST 1 VIEW COMPARISON:  11/20/2015 FINDINGS: Endotracheal tube, nasogastric catheter and right-sided PICC line are again seen and stable. Cardiac shadow is at the upper limits of normal in size. Diffuse bilateral infiltrates are again identified and unchanged. No new focal abnormality is seen. IMPRESSION: Stable appearance of the chest. Tubes and lines as described. Electronically Signed   By: Inez Catalina M.D.   On: 11/21/2015 07:16     ASSESSMENT / PLAN:  PULMONARY A: Admit:  Acute hypoxic resp failure with infiltrates  Current:  PCP Pneumonia  ARDS due to Alveolar Hemorrhage possible got worse due to IRS  11/5 - On d2 nimbex complet but stil ith 7% FiO2 with PEEP of 12.   P:   ards net protocol on vent Continue nimbex - for now - (will be at risk for criticall ilnees myopathy due to nimbex and steroids but ards warrants nimbex) STARTer prone ventilation  11/22/15 18h prone/4h supoine Continue Abx per ID Steroids for PCP   CARDIOVASCULAR A:  Hx of HTN CC 1/17 with no CAD   - normal bp/hr  P:  monitor  RENAL A:   CRI with AKI,    -improvement in Cr after lasix held 11/19/15  P:   Follow urine output and Cr. 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   - on TF at 10cc/h  P:   PPI NPO  TFs initiated 11/2   HEMATOLOGIC  Recent Labs  11/21/15 0435 11/22/15 0440  HGB 7.2* 7.6*    A:   Anemia  of chronic and critical illness- S/p 1uPRBC 11/2 for Hgb 6.5 with good response  DVT protection P:  SCDs  Transfuse per protocol   INFECTIOUS Blood Cx 10/28: NG (final) BAL Culture 10/31: NGTD Pneumocystis Smear 10/31: Positive - Concern for IRIS s reaspn for intubation 11/17/15   A:   HIV/AIDS Trush 11/19/15 PCP 11/17/15 IRIS 11/17/15 (s/p HAART 10/21 - 10/28(    P:   Per ID consult Bactrim (10/20 >> ), Unasyn (10/29 >> ) and Azithro (10/26 >> ) Steroids Fluconazole (11/2 >> )  ARVs held starting 10/28 with concern for IRIS    ENDOCRINE A:   DM exacerbated by steroids for Crohn's  P:   SSI  FAMILY  - Updates: Family mom and rfevered and friend Jenny Reichmann updated bedside about ARDS 11/21/15 at bedside  GLOBAL  only mom and daughter Audery Amel know HIV diagnosis. Rest should not  SUMMARY Admitted 11/08/2015 and diagnosed with HIV 11/06/15 with prsumed PCP Rx. Started HAAART 11/07/15 and stopped 11/14/15 due to IRIS concern. Intubated for ARDS/Alv hge and PCP confirmation 11/17/15. Started nimbx 11/20/15. Started prone vent 11/22/15. Course also complicated by AKI . Improving as of 11/22/15    The patient is critically ill with multiple organ systems failure and requires high complexity decision making for assessment and support, frequent evaluation and titration of therapies, application of advanced monitoring  technologies and extensive interpretation of multiple databases.   Critical Care Time devoted to patient care services described in this note is  30  Minutes. This time reflects time of care of this signee Dr Brand Males. This critical care time does not reflect procedure time, or teaching time or supervisory time of PA/NP/Med student/Med Resident etc but could involve care discussion time    Dr. Brand Males, M.D., [facility].C.P Pulmonary and Critical Care Medicine Staff Physician [place] [facility] Pager: [phone], If no answer or between  15:00h - 7:00h: call [phone]  11/22/2015 8:09 AM

## 2015-11-22 NOTE — Progress Notes (Signed)
RT note- head turned, ETT noted in correct position. Recruitment maneuver performed.

## 2015-11-23 ENCOUNTER — Inpatient Hospital Stay (HOSPITAL_COMMUNITY): Payer: BC Managed Care – PPO

## 2015-11-23 DIAGNOSIS — D696 Thrombocytopenia, unspecified: Secondary | ICD-10-CM

## 2015-11-23 DIAGNOSIS — D893 Immune reconstitution syndrome: Secondary | ICD-10-CM

## 2015-11-23 LAB — GLUCOSE, CAPILLARY
GLUCOSE-CAPILLARY: 97 mg/dL (ref 65–99)
Glucose-Capillary: 121 mg/dL — ABNORMAL HIGH (ref 65–99)
Glucose-Capillary: 166 mg/dL — ABNORMAL HIGH (ref 65–99)
Glucose-Capillary: 176 mg/dL — ABNORMAL HIGH (ref 65–99)
Glucose-Capillary: 83 mg/dL (ref 65–99)
Glucose-Capillary: 91 mg/dL (ref 65–99)

## 2015-11-23 LAB — CBC
HCT: 22.5 % — ABNORMAL LOW (ref 36.0–46.0)
Hemoglobin: 7.1 g/dL — ABNORMAL LOW (ref 12.0–15.0)
MCH: 28.3 pg (ref 26.0–34.0)
MCHC: 31.6 g/dL (ref 30.0–36.0)
MCV: 89.6 fL (ref 78.0–100.0)
PLATELETS: 29 10*3/uL — AB (ref 150–400)
RBC: 2.51 MIL/uL — ABNORMAL LOW (ref 3.87–5.11)
RDW: 17.8 % — AB (ref 11.5–15.5)
WBC: 4.8 10*3/uL (ref 4.0–10.5)

## 2015-11-23 LAB — MAGNESIUM: MAGNESIUM: 2.2 mg/dL (ref 1.7–2.4)

## 2015-11-23 LAB — BASIC METABOLIC PANEL
Anion gap: 10 (ref 5–15)
BUN: 79 mg/dL — AB (ref 6–20)
CALCIUM: 7.4 mg/dL — AB (ref 8.9–10.3)
CO2: 30 mmol/L (ref 22–32)
CREATININE: 1.29 mg/dL — AB (ref 0.44–1.00)
Chloride: 107 mmol/L (ref 101–111)
GFR calc Af Amer: 53 mL/min — ABNORMAL LOW (ref >60)
GFR, EST NON AFRICAN AMERICAN: 46 mL/min — AB (ref >60)
GLUCOSE: 166 mg/dL — AB (ref 65–99)
Potassium: 3.5 mmol/L (ref 3.5–5.1)
Sodium: 147 mmol/L — ABNORMAL HIGH (ref 135–145)

## 2015-11-23 LAB — POCT I-STAT 3, ART BLOOD GAS (G3+)
Acid-Base Excess: 8 mmol/L — ABNORMAL HIGH (ref 0.0–2.0)
BICARBONATE: 33.5 mmol/L — AB (ref 20.0–28.0)
O2 Saturation: 88 %
PCO2 ART: 55.2 mmHg — AB (ref 32.0–48.0)
TCO2: 35 mmol/L (ref 0–100)
pH, Arterial: 7.39 (ref 7.350–7.450)
pO2, Arterial: 57 mmHg — ABNORMAL LOW (ref 83.0–108.0)

## 2015-11-23 LAB — PHOSPHORUS: PHOSPHORUS: 3.2 mg/dL (ref 2.5–4.6)

## 2015-11-23 MED ORDER — CLINDAMYCIN PHOSPHATE 900 MG/50ML IV SOLN
900.0000 mg | Freq: Three times a day (TID) | INTRAVENOUS | Status: DC
  Administered 2015-11-23 – 2015-11-27 (×12): 900 mg via INTRAVENOUS
  Filled 2015-11-23 (×15): qty 50

## 2015-11-23 MED ORDER — PRO-STAT SUGAR FREE PO LIQD
30.0000 mL | Freq: Every day | ORAL | Status: DC
  Administered 2015-11-23 – 2015-11-26 (×4): 30 mL
  Filled 2015-11-23 (×6): qty 30

## 2015-11-23 MED ORDER — VITAL HIGH PROTEIN PO LIQD
1000.0000 mL | ORAL | Status: DC
  Administered 2015-11-23 – 2015-11-27 (×5): 1000 mL

## 2015-11-23 MED ORDER — METHYLPREDNISOLONE SODIUM SUCC 40 MG IJ SOLR
40.0000 mg | Freq: Two times a day (BID) | INTRAMUSCULAR | Status: DC
  Administered 2015-11-23 – 2015-11-25 (×4): 40 mg via INTRAVENOUS
  Filled 2015-11-23 (×4): qty 1

## 2015-11-23 MED ORDER — PRIMAQUINE PHOSPHATE 26.3 MG PO TABS
30.0000 mg | ORAL_TABLET | Freq: Every day | ORAL | Status: DC
  Administered 2015-11-23 – 2015-11-26 (×4): 30 mg via ORAL
  Filled 2015-11-23 (×6): qty 2

## 2015-11-23 NOTE — Progress Notes (Signed)
CRITICAL VALUE ALERT  Critical value received:  platelets  Date of notification: 11/23/15   Time of notification: 6:24 AM   Critical value read back:yes   Nurse who received alert:  Jabier Gauss  MD notified (1st page):  Dr Ashok Cordia  Time of first page:  06:24AM  Time MD responded: 06:24 AM

## 2015-11-23 NOTE — Progress Notes (Signed)
Nutrition Follow-up  DOCUMENTATION CODES:   Obesity unspecified  INTERVENTION:    Resume TF via OGT with Vital High Protein at goal rate of 45 ml/h (1080 ml per day) with Prostat 30 ml once daily to provide 1180 kcal, 110 gm protein, 903 ml free water daily.  NUTRITION DIAGNOSIS:   Inadequate oral intake related to inability to eat as evidenced by NPO status.  Ongoing  GOAL:   Provide needs based on ASPEN/SCCM guidelines  Unmet.  MONITOR:   Vent status, Labs, Weight trends, I & O's  ASSESSMENT:   [name] with advanced HIV disease/AIDS initially admitted for colitis but then diagnosed with HIV and presumed PCP with +cxr, elevated LDH.  Discussed patient in ICU rounds and with RN today. Patient was proned yesterday until 8 am today. TF off while patient in prone position since OGT tip is in the stomach, not post-pyloric. Propofol has been discontinued. TF being resumed today; RN and MD requested RD to re-evaluate TF regimen and update orders as needed. Patient is currently intubated on ventilator support Temp (24hrs), Avg:97.6 F (36.4 C), Min:96.4 F (35.8 C), Max:98.3 F (36.8 C)  Diet Order:  Diet NPO time specified  Skin:  Wound (see comment) (stage II pressure injury to buttocks)  Last BM:  11/5  Height:   Ht Readings from Last 1 Encounters:  11/16/15 5\' 4"  (1.626 m)    Weight:   Wt Readings from Last 1 Encounters:  11/22/15 194 lb 10.7 oz (88.3 kg)    Ideal Body Weight:  54.5 kg  BMI:  Body mass index is 33.41 kg/m.  Estimated Nutritional Needs:   Kcal:  [id number]  Protein:  110-120 gm  Fluid:  1.5-2 L  EDUCATION NEEDS:   No education needs identified at this time  Molli Barrows, [place], [place], [place] Pager [phone] After Hours Pager [phone]

## 2015-11-23 NOTE — Progress Notes (Signed)
CSW engaged with Patient's mother, Billy Fischer, in Patient's room. Patient's mother inquired about obtaining Healthcare Power of [place] and financial concerns. CSW explained that with Patient being on the ventilator and on sedating medications, legally, Patient is not competent to sign POA paperwork. CSW explained that power of attorney paperwork must be initiated by the patient. Patient's mother expressed understanding. CSW informed Patient's mother of the process of petitioning for guardianship to have access over Patient's finances. Per Patient's mother, she believes all of her daughter's bills are direct deposit but would just like to ensure that her daughter's bills are being paid. Patient's mother also inquired about short term disability. CSW encouraged Patient's mother reach out to her daughter's employers to determine if she is eligible for short term disability.   Patient's mother requested CSW also contact Patient's oldest daughter and the next of kin, Jon Gills [phone]) and relay all information provided. CSW spoke with [facility] over the phone and informed her of all of the above information. Sheree expressed understanding and reports that she will be here on Wednesday to go to the courthouse to pursue guardianship. Patient's daughter reports no further questions or concerns at this time. CSW encouraged her to reach back out CSW if she has any questions.          Emiliano Dyer, LCSW [facility] ED/50M Clinical Social Worker [phone]

## 2015-11-23 NOTE — Progress Notes (Signed)
[place] for Infectious Disease   Reason for visit: Follow up on PCP pneumonia  Interval History: remains intubated, on nimbex; ARDS.    Physical Exam: Constitutional:  Vitals:   11/23/15 0900 11/23/15 1000  BP: 129/81   Pulse: 92 88  Resp: (!) 0 (!) 35  Temp:    intubated, sedated HENT: + ET; facial edema Respiratory: diffuse rhonchi Cardiovascular: tachy RR   Review of Systems: Unable to be assessed due to mental status  Lab Results  Component Value Date   WBC 4.8 11/23/2015   HGB 7.1 (L) 11/23/2015   HCT 22.5 (L) 11/23/2015   MCV 89.6 11/23/2015   PLT 29 (LL) 11/23/2015    Lab Results  Component Value Date   CREATININE 1.29 (H) 11/23/2015   BUN 79 (H) 11/23/2015   NA 147 (H) 11/23/2015   K 3.5 11/23/2015   CL 107 11/23/2015   CO2 30 11/23/2015    Lab Results  Component Value Date   ALT 13 (L) 11/17/2015   AST 29 11/17/2015   ALKPHOS 162 (H) 11/17/2015     Microbiology: Recent Results (from the past 240 hour(s))  Culture, blood (Routine X 2) w Reflex to ID Panel     Status: None   Collection Time: 11/14/15 10:15 AM  Result Value Ref Range Status   Specimen Description BLOOD LEFT ASSIST CONTROL  Final   Special Requests BOTTLES DRAWN AEROBIC AND ANAEROBIC 5CC  Final   Culture NO GROWTH 5 DAYS  Final   Report Status 11/19/2015 FINAL  Final  Culture, blood (Routine X 2) w Reflex to ID Panel     Status: None   Collection Time: 11/14/15 10:15 AM  Result Value Ref Range Status   Specimen Description BLOOD LEFT HAND  Final   Special Requests IN PEDIATRIC BOTTLE 3CC  Final   Culture NO GROWTH 5 DAYS  Final   Report Status 11/19/2015 FINAL  Final  Culture, bal-quantitative     Status: None   Collection Time: 11/17/15  4:28 PM  Result Value Ref Range Status   Specimen Description BRONCHIAL ALVEOLAR LAVAGE  Final   Special Requests NONE  Final   Gram Stain   Final    MODERATE WBC PRESENT, PREDOMINANTLY PMN NO ORGANISMS SEEN    Culture NO  GROWTH 2 DAYS  Final   Report Status 11/20/2015 FINAL  Final  Pneumocystis smear by DFA     Status: None   Collection Time: 11/17/15  4:28 PM  Result Value Ref Range Status   Specimen Source-[facility] BRONCHIAL ALVEOLAR LAVAGE  Final   Pneumocystis jiroveci Ag POSITIVE  Final    Comment: Performed at [facility], READ BACK BY AND VERIFIED WITH: JENNIFER CLIFTON RN BY LM AT [place] ON 11/18/15   Fungus Culture With Stain     Status: None (Preliminary result)   Collection Time: 11/17/15  4:29 PM  Result Value Ref Range Status   Fungus Stain Final report  Final    Comment: (NOTE) Performed At: [facility] [place], [place] [id number] Lindon Romp MD [id number]    Fungus (Mycology) Culture PENDING  Incomplete   Fungal Source BRONCHIAL ALVEOLAR LAVAGE  Final  Fungus Culture Result     Status: None   Collection Time: 11/17/15  4:29 PM  Result Value Ref Range Status   Result 1 Comment  Final    Comment: (NOTE) KOH/Calcofluor preparation:  no fungus observed. Performed  At: [facility] [place], [place] [id number] Lindon Romp MD [id number]     Impression/Plan:  1. PCP pneumonia - on bactrim and steroids and will continue treatment.  Steroids reduced.   2. HIVAIDS - was on treatment but has been off ARVs with concern for IRIS.  On weekly azithromycin.  3.  Thrombocytopenia - multiple possible etiologies.  Will change bactrim to clinda/primaquin 4.  Renal insufficiency - improved.   5. ARDS - supportive treatments.

## 2015-11-23 NOTE — Progress Notes (Signed)
PULMONARY / CRITICAL CARE MEDICINE   Name: Nancy Acevedo MRN: [id number] DOB: [birth year]    ADMISSION DATE:  11/13/2015 CONSULTATION DATE:  10/20  REFERRING MD:  Triad  CHIEF COMPLAINT: Can't breathe  Brief:  55 Y/O with hypertension, chronic kidney disease stage II, OSA, GERD, and recent diagnosis of Crohn's colitis managed with prednisone Admittedwith resp distress, fevers, B/L infitrates.  She was diagnosed with HIV, AIDS CD4 count <4. Presumed PCP vs bacterial PNA. Getting treatment with Abx, bactrim and steroids. Also on HAART therapy as per ID  LINES/TUBES: PICC 10/15 >>  ETT 10/31 >>  Foley 11/1 >>  NG/OT 11/1 >>   Events 11/05/2015  -admit  10/14 - fever resolved 11/02/15 - fever back 10/17 -- MRSA Pcr negative 11/06/15- ICU tx 11/07/15 - HIV +ve from10/20/17, RVP  Negative, C Diff negative .STart bipap and lasix. ID consult -> Rx PCP (DO NOT DISCLOSE TO FAMILY). AUTOIMMUNE ORDERED 10/29- Re consult for ongoing hypoxia 10/31 Increasing oxygen demands on 100% BiPap, unable to transition to [facility]; Intubated & BAL perfomed in right middle lobe  11/1: Family meeting where diagnosis disclosed to [name] and mother  11/2: Received 1u PRBC for HgB <7. Started TF.  11/20/15 - started nimbex for severe ARDS 11/21/15 - severe ards persists 70% fio2, peep 12. Not on pressors.Making urine. Creat better. Mother at bedside with reverend and her boyfriend Jenny Reichmann 11/22/15: Met indication for prone ventilation at 70% fio2/peep 12 w/ pulse ox 91%. AKI improving.    SUBJECTIVE/OVERNIGHT/INTERVAL HX 11/23/15: Critical lab result of platelets of 29. S/p 18 hours prone and now supine. Per RN, adequately sedated.   VITAL SIGNS: BP (!) 130/92   Pulse 86   Temp 98 F (36.7 C) (Oral)   Resp (!) 35   Ht '5\' 4"'$  (1.626 m)   Wt 194 lb 10.7 oz (88.3 kg)   SpO2 96%   BMI 33.41 kg/m   HEMODYNAMICS:    VENTILATOR SETTINGS: Vent Mode: PRVC FiO2 (%):  [70 %-100 %] 70 % Set Rate:  [35  bmp] 35 bmp Vt Set:  [380 mL] 380 mL PEEP:  [12 cmH20] 12 cmH20 Plateau Pressure:  [21 cmH20-29 cmH20] 29 cmH20  INTAKE / OUTPUT: I/O last 3 completed shifts: In: 5321.6 [I.V.:3741.6; NG/GT:290; IV Piggyback:1290] Out: 4431 [Urine:2595; Stool:450]  PHYSICAL EXAMINATION: General:  Critically ill looking  NeuroRASS -5/Bis 40s on fent/versed and nimbex d3 HEENT: ETT in place. Eyes closed.  Cardiovascular:  RRR, Nl S1/S2, No MRG. Lungs:  Scattered crackles, rhonchi. Abdomen:  soft +bs, non-distended  Skin: warm and dry  LABS:  PULMONARY  Recent Labs Lab 11/17/15 1740  11/20/15 1335 11/20/15 1508 11/20/15 1640 11/20/15 2120 11/21/15 0329  PHART 7.245*  < > 7.267* 7.232* 7.241* 7.317* 7.325*  PCO2ART 45.9  < > 46.6 51.4* 47.4 41.7 42.7  PO2ART 126.0*  < > 82.8* 67.7* 64.5* 52.9* 67.0*  HCO3 19.9*  < > 20.5 20.8 19.7* 20.8 22.3  TCO2 21  --   --   --   --   --  24  O2SAT 98.0  < > 93.1 87.4 87.7 84.1 92.0  < > = values in this interval not displayed. CBC  Recent Labs Lab 11/21/15 0435 11/22/15 0440 11/23/15 0430  HGB 7.2* 7.6* 7.1*  HCT 21.9* 22.9* 22.5*  WBC 4.2 4.2 4.8  PLT 49* 43* 29*   COAGULATION No results for input(s): INR in the last 168 hours.  CARDIAC  No results for input(s): TROPONINI in  the last 168 hours. No results for input(s): PROBNP in the last 168 hours.  CHEMISTRY  Recent Labs Lab 11/18/15 0500  11/19/15 0659 11/20/15 0405 11/21/15 0435 11/22/15 0440 11/23/15 0430  NA 128*  < > 132* 133* 139 144 147*  K 6.0*  < > 4.6 3.3* 3.3* 2.5* 3.5  CL 98*  < > 100* 100* 105 106 107  CO2 20*  < > 19* 20* '24 26 30  '$ GLUCOSE 201*  < > 179* 275* 205* 125* 166*  BUN 49*  < > 64* 81* 89* 84* 79*  CREATININE 2.46*  < > 3.19* 2.90* 2.16* 1.55* 1.29*  CALCIUM 8.2*  < > 7.9* 7.6* 7.5* 7.5* 7.4*  MG 2.6*  --   --  2.4 2.3 2.3 2.2  PHOS 5.6*  --   --  5.8* 4.7* 3.6 3.2  < > = values in this interval not displayed. Estimated Creatinine Clearance: 53.6  mL/min (by C-G formula based on SCr of 1.29 mg/dL (H)).  LIVER  Recent Labs Lab 11/17/15 0813  AST 29  ALT 13*  ALKPHOS 162*  BILITOT 0.4  PROT 5.3*  ALBUMIN 2.2*   INFECTIOUS No results for input(s): LATICACIDVEN, PROCALCITON in the last 168 hours.  ENDOCRINE CBG (last 3)   Recent Labs  11/22/15 2001 11/23/15 0011 11/23/15 0406  GLUCAP 170* 166* 176*   IMAGING x48h  - image(s) personally visualized  -   highlighted in bold Dg Chest Port 1 View  Result Date: 11/22/2015 CLINICAL DATA:  Check endotracheal tube placement EXAM: PORTABLE CHEST 1 VIEW COMPARISON:  11/22/2015 FINDINGS: Endotracheal tube is again identified in satisfactory position approximately 3.5 cm above the carina. Right PICC line and nasogastric catheter are again seen in satisfactory position. Diffuse bilateral infiltrates are again noted and stable. No acute bony abnormality is seen. IMPRESSION: No significant change from the prior exam. Electronically Signed   By: Inez Catalina M.D.   On: 11/22/2015 14:24   Dg Chest Port 1 View  Result Date: 11/22/2015 CLINICAL DATA:  Ventilator dependent EXAM: PORTABLE CHEST 1 VIEW COMPARISON:  11/21/2015 FINDINGS: Endotracheal tube, nasogastric catheter and right-sided PICC line are again seen and stable. Cardiac shadow is at the upper limits of normal in size. Bilateral diffuse infiltrates are again identified and stable. No acute bony abnormality is noted. IMPRESSION: No change from the previous day. Electronically Signed   By: Inez Catalina M.D.   On: 11/22/2015 07:14     ASSESSMENT / PLAN:  PULMONARY A: Admit:  Acute hypoxic resp failure with infiltrates  Current:  PCP Pneumonia  ARDS due to Alveolar Hemorrhage possible got worse due to IRS  11/6 - On d3 nimbex but still with 70% FiO2 with PEEP of 12.   P:   ards net protocol on vent Continue nimbex - for now - (will be at risk for criticall ilnees myopathy due to nimbex and steroids but ards warrants  nimbex) prone ventilation 11/22/15 18h prone/4h supine Continue Abx per ID Steroids for PCP   CARDIOVASCULAR A:  Hx of HTN CC 1/17 with no CAD   - normal bp/hr  P:  monitor  RENAL A:   CRI with AKI,    -improvement in Cr after lasix held 11/19/15  P:   Follow urine output and Cr. 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   - on TF at 10cc/h  P:  PPI NPO  TFs initiated 11/2   HEMATOLOGIC  Recent Labs  11/22/15 0440 11/23/15 0430  HGB 7.6* 7.1*    A:   Anemia of chronic and critical illness- S/p 1uPRBC 11/2 for Hgb 6.5 with good response  DVT protection Thrombocytopenia  P:  SCDs  Transfuse per protocol   INFECTIOUS Blood Cx 10/28: NG (final) BAL Culture 10/31: NGTD Pneumocystis Smear 10/31: Positive - Concern for IRIS s reaspn for intubation 11/17/15   A:   HIV/AIDS Trush 11/19/15 PCP 11/17/15 IRIS 11/17/15 (s/p HAART 10/21 - 10/28)    P:   Per ID consult Bactrim (10/20 >> ), Unasyn (10/29 >>11/5 ) and Azithro (10/26 >> ) Steroids Fluconazole (11/2 >> )  ARVs held starting 10/28 with concern for IRIS    ENDOCRINE A:   DM exacerbated by steroids for Crohn's  P:   SSI  FAMILY  - Updates: Family mom and rfevered and friend Jenny Reichmann updated bedside about ARDS 11/21/15 at bedside  GLOBAL  only mom and daughter Audery Amel know HIV diagnosis. Rest should not    Phill Myron, D.O. 11/23/2015, 7:42 AM PGY-2, [place]   Attending:  I have seen and examined the patient with nurse practitioner/resident and agree with the note above.  We formulated the plan together and I elicited the following history.    Chart reviewed> new diagnosis of HIV, PCP, ARDS Prone started yesterday On high dose sedation and paralytic x3 days  On exam Few crackles, wheezes, vent supported breaths Paralyzed on vent CV: RRR, no mgr Belly  soft  PCP pneumonia> continue bactrim, decrease solumedrol '40mg'$  q12h HIV> continue azithromycin Thrombocytopenia, without bleeding worsening> dropping now, could this be bactrim related? PPI? Acute illness related? Less likely HITT based on lovenox timecourse Stop sodium bicarbonate ARDS> discontinue prone protocol; lung protective ventilation (TVol 6-8cc/kg), permissive hypercapnea OK, check ABG now; consider stopping nimbex  My cc time 34 minutes  Roselie Awkward, MD [place] [facility] Pager: [phone] Cell: [phone] After 3pm or if no response, call [phone]

## 2015-11-24 ENCOUNTER — Inpatient Hospital Stay (HOSPITAL_COMMUNITY): Payer: BC Managed Care – PPO

## 2015-11-24 DIAGNOSIS — N170 Acute kidney failure with tubular necrosis: Secondary | ICD-10-CM

## 2015-11-24 LAB — CBC
HEMATOCRIT: 23.3 % — AB (ref 36.0–46.0)
HEMOGLOBIN: 7.4 g/dL — AB (ref 12.0–15.0)
MCH: 28.9 pg (ref 26.0–34.0)
MCHC: 31.8 g/dL (ref 30.0–36.0)
MCV: 91 fL (ref 78.0–100.0)
Platelets: 27 10*3/uL — CL (ref 150–400)
RBC: 2.56 MIL/uL — ABNORMAL LOW (ref 3.87–5.11)
RDW: 18 % — ABNORMAL HIGH (ref 11.5–15.5)
WBC: 4.8 10*3/uL (ref 4.0–10.5)

## 2015-11-24 LAB — GLUCOSE, CAPILLARY
GLUCOSE-CAPILLARY: 117 mg/dL — AB (ref 65–99)
GLUCOSE-CAPILLARY: 140 mg/dL — AB (ref 65–99)
GLUCOSE-CAPILLARY: 148 mg/dL — AB (ref 65–99)
GLUCOSE-CAPILLARY: 151 mg/dL — AB (ref 65–99)
Glucose-Capillary: 172 mg/dL — ABNORMAL HIGH (ref 65–99)
Glucose-Capillary: 132 mg/dL — ABNORMAL HIGH (ref 65–99)
Glucose-Capillary: 145 mg/dL — ABNORMAL HIGH (ref 65–99)

## 2015-11-24 LAB — PHOSPHORUS: PHOSPHORUS: 3.7 mg/dL (ref 2.5–4.6)

## 2015-11-24 LAB — BLOOD GAS, ARTERIAL
ACID-BASE EXCESS: 4.1 mmol/L — AB (ref 0.0–2.0)
Bicarbonate: 29.5 mmol/L — ABNORMAL HIGH (ref 20.0–28.0)
DRAWN BY: 406621
FIO2: 70
MECHVT: 280 mL
O2 SAT: 86.2 %
PATIENT TEMPERATURE: 98.6
PCO2 ART: 56.5 mmHg — AB (ref 32.0–48.0)
PEEP: 12 cmH2O
PH ART: 7.338 — AB (ref 7.350–7.450)
PO2 ART: 57.8 mmHg — AB (ref 83.0–108.0)
RATE: 35 resp/min

## 2015-11-24 LAB — BASIC METABOLIC PANEL
ANION GAP: 11 (ref 5–15)
BUN: 90 mg/dL — ABNORMAL HIGH (ref 6–20)
CHLORIDE: 110 mmol/L (ref 101–111)
CO2: 29 mmol/L (ref 22–32)
Calcium: 7.9 mg/dL — ABNORMAL LOW (ref 8.9–10.3)
Creatinine, Ser: 1.41 mg/dL — ABNORMAL HIGH (ref 0.44–1.00)
GFR calc non Af Amer: 41 mL/min — ABNORMAL LOW (ref >60)
GFR, EST AFRICAN AMERICAN: 48 mL/min — AB (ref >60)
GLUCOSE: 194 mg/dL — AB (ref 65–99)
POTASSIUM: 3.7 mmol/L (ref 3.5–5.1)
Sodium: 150 mmol/L — ABNORMAL HIGH (ref 135–145)

## 2015-11-24 LAB — MAGNESIUM: Magnesium: 2.5 mg/dL — ABNORMAL HIGH (ref 1.7–2.4)

## 2015-11-24 LAB — INFLUENZA PANEL BY PCR (TYPE A & B)
INFLBPCR: NEGATIVE
Influenza A By PCR: NEGATIVE

## 2015-11-24 MED ORDER — FENTANYL BOLUS VIA INFUSION
100.0000 ug | INTRAVENOUS | Status: DC | PRN
  Administered 2015-11-25 – 2015-11-26 (×5): 100 ug via INTRAVENOUS
  Filled 2015-11-24: qty 100

## 2015-11-24 MED ORDER — ROCURONIUM BROMIDE 50 MG/5ML IV SOLN
1.0000 mg/kg | Freq: Once | INTRAVENOUS | Status: AC
  Administered 2015-11-24: 90.3 mg via INTRAVENOUS
  Filled 2015-11-24 (×2): qty 9.03

## 2015-11-24 MED ORDER — INSULIN GLARGINE 100 UNIT/ML ~~LOC~~ SOLN
15.0000 [IU] | Freq: Every day | SUBCUTANEOUS | Status: DC
  Administered 2015-11-24 – 2015-11-25 (×2): 15 [IU] via SUBCUTANEOUS
  Filled 2015-11-24 (×4): qty 0.15

## 2015-11-24 MED ORDER — FREE WATER
200.0000 mL | Freq: Four times a day (QID) | Status: DC
  Administered 2015-11-24 – 2015-11-25 (×4): 200 mL

## 2015-11-24 MED ORDER — MIDAZOLAM BOLUS VIA INFUSION
4.0000 mg | INTRAVENOUS | Status: DC | PRN
  Administered 2015-11-25 – 2015-11-26 (×4): 4 mg via INTRAVENOUS
  Filled 2015-11-24: qty 4

## 2015-11-24 NOTE — Progress Notes (Signed)
Per Dr Lake Bells, wean FiO2 to 60% as tolerated, but keep peep at 14. RT will continue to monitor.

## 2015-11-24 NOTE — Progress Notes (Signed)
PULMONARY / CRITICAL CARE MEDICINE   Name: Nancy Acevedo MRN: [id number] DOB: 07-01-1960    ADMISSION DATE:  11/07/2015 CONSULTATION DATE:  10/20  REFERRING MD:  Triad  CHIEF COMPLAINT: Can't breathe  Brief:  55 Y/O with hypertension, chronic kidney disease stage II, OSA, GERD, and recent diagnosis of Crohn's colitis managed with prednisone Admittedwith resp distress, fevers, B/L infitrates.  She was diagnosed with HIV, AIDS CD4 count <4. Presumed PCP vs bacterial PNA. Getting treatment with Abx, bactrim and steroids. Also on HAART therapy as per ID  LINES/TUBES: PICC 10/15 >>  ETT 10/31 >>  Foley 11/1 >>  NG/OT 11/1 >>   Events 10/31/2015  -admit  10/14 - fever resolved 11/02/15 - fever back 10/17 -- MRSA Pcr negative 11/06/15- ICU tx 11/07/15 - HIV +ve from10/20/17, RVP  Negative, C Diff negative .STart bipap and lasix. ID consult -> Rx PCP (DO NOT DISCLOSE TO FAMILY). AUTOIMMUNE ORDERED 10/29- Re consult for ongoing hypoxia 10/31 Increasing oxygen demands on 100% BiPap, unable to transition to [facility]; Intubated & BAL perfomed in right middle lobe  11/1: Family meeting where diagnosis disclosed to [name] and mother  11/2: Received 1u PRBC for HgB <7. Started TF.  11/20/15 - started nimbex for severe ARDS 11/21/15 - severe ards persists 70% fio2, peep 12. Not on pressors.Making urine. Creat better. Mother at bedside with reverend and her boyfriend Jenny Reichmann 11/22/15: Met indication for prone ventilation at 70% fio2/peep 12 w/ pulse ox 91%. AKI improving.  11/23/15: Platelets low to 29. Switched Bactrim to Clinda/Primaquin. Discontinued Nimbex and prone protocol.    SUBJECTIVE/OVERNIGHT/INTERVAL HX 11/24/15: Still heavily sedated. Have not done wake up assessment yet.   VITAL SIGNS: BP (!) 182/83   Pulse (!) 102   Temp 99.6 F (37.6 C) (Oral)   Resp (!) 0   Ht '5\' 4"'$  (1.626 m)   Wt 199 lb 1.2 oz (90.3 kg)   SpO2 91%   BMI 34.17 kg/m   HEMODYNAMICS:    VENTILATOR  SETTINGS: Vent Mode: PRVC FiO2 (%):  [70 %] 70 % Set Rate:  [35 bmp] 35 bmp Vt Set:  [380 mL] 380 mL PEEP:  [12 cmH20] 12 cmH20 Plateau Pressure:  [28 cmH20-32 cmH20] 31 cmH20  INTAKE / OUTPUT: I/O last 3 completed shifts: In: 3985.3 [I.V.:2658.8; NG/GT:1151.6; IV Piggyback:175] Out: 2750 [Urine:2400; Stool:350]  PHYSICAL EXAMINATION: General:  Critically ill looking  Neuro: RASS -5.  HEENT: ETT in place. Eyes closed.  Cardiovascular:  RRR, Nl S1/S2, No MRG. Lungs:  Scattered crackles, rhonchi. Abdomen:  soft +bs, non-distended  Skin: warm and dry  LABS:  PULMONARY  Recent Labs Lab 11/17/15 1740  11/20/15 1508 11/20/15 1640 11/20/15 2120 11/21/15 0329 11/23/15 0951  PHART 7.245*  < > 7.232* 7.241* 7.317* 7.325* 7.390  PCO2ART 45.9  < > 51.4* 47.4 41.7 42.7 55.2*  PO2ART 126.0*  < > 67.7* 64.5* 52.9* 67.0* 57.0*  HCO3 19.9*  < > 20.8 19.7* 20.8 22.3 33.5*  TCO2 21  --   --   --   --  24 35  O2SAT 98.0  < > 87.4 87.7 84.1 92.0 88.0  < > = values in this interval not displayed. CBC  Recent Labs Lab 11/22/15 0440 11/23/15 0430 11/24/15 0414  HGB 7.6* 7.1* 7.4*  HCT 22.9* 22.5* 23.3*  WBC 4.2 4.8 4.8  PLT 43* 29* 27*   COAGULATION No results for input(s): INR in the last 168 hours.  CARDIAC  No results for input(s): TROPONINI  in the last 168 hours. No results for input(s): PROBNP in the last 168 hours.  CHEMISTRY  Recent Labs Lab 11/20/15 0405 11/21/15 0435 11/22/15 0440 11/23/15 0430 11/24/15 0414  NA 133* 139 144 147* 150*  K 3.3* 3.3* 2.5* 3.5 3.7  CL 100* 105 106 107 110  CO2 20* '24 26 30 29  '$ GLUCOSE 275* 205* 125* 166* 194*  BUN 81* 89* 84* 79* 90*  CREATININE 2.90* 2.16* 1.55* 1.29* 1.41*  CALCIUM 7.6* 7.5* 7.5* 7.4* 7.9*  MG 2.4 2.3 2.3 2.2 2.5*  PHOS 5.8* 4.7* 3.6 3.2 3.7   Estimated Creatinine Clearance: 49.6 mL/min (by C-G formula based on SCr of 1.41 mg/dL (H)).  LIVER No results for input(s): AST, ALT, ALKPHOS, BILITOT, PROT,  ALBUMIN, INR in the last 168 hours. INFECTIOUS No results for input(s): LATICACIDVEN, PROCALCITON in the last 168 hours.  ENDOCRINE CBG (last 3)   Recent Labs  11/23/15 2000 11/23/15 2353 11/24/15 0353  GLUCAP 121* 148* 172*   IMAGING x48h  - image(s) personally visualized  -   highlighted in bold Dg Chest Port 1 View  Result Date: 11/23/2015 CLINICAL DATA:  Hypoxia EXAM: PORTABLE CHEST 1 VIEW COMPARISON:  November 22, 2015 FINDINGS: Endotracheal tube tip is 7.6 cm above the carina. Nasogastric tube tip in stomach. There is no pneumothorax. There is airspace consolidation throughout the right mid and lower lung zones. There is diffuse interstitial edema throughout the lungs bilaterally. Heart is upper normal in size with pulmonary vascularity within normal limits. No adenopathy. No bone lesions. IMPRESSION: Tube positions as described without pneumothorax. Widespread interstitial edema throughout the lungs with airspace consolidation throughout the right mid and lower lung zones. Suspect pneumonia on the right. Suspect widespread pulmonary edema with likely superimposed pneumonia, particularly in the right mid and lower lung zones. Stable cardiac silhouette. Electronically Signed   By: Lowella Grip III M.D.   On: 11/23/2015 10:46   Dg Chest Port 1 View  Result Date: 11/22/2015 CLINICAL DATA:  Check endotracheal tube placement EXAM: PORTABLE CHEST 1 VIEW COMPARISON:  11/22/2015 FINDINGS: Endotracheal tube is again identified in satisfactory position approximately 3.5 cm above the carina. Right PICC line and nasogastric catheter are again seen in satisfactory position. Diffuse bilateral infiltrates are again noted and stable. No acute bony abnormality is seen. IMPRESSION: No significant change from the prior exam. Electronically Signed   By: Inez Catalina M.D.   On: 11/22/2015 14:24   Dg Abd Portable 1v  Result Date: 11/23/2015 CLINICAL DATA:  Nasogastric tube placement EXAM: PORTABLE  ABDOMEN - 1 VIEW COMPARISON:  November 17, 2015 FINDINGS: Nasogastric tube tip and side port are in the stomach. The bowel gas pattern is unremarkable. No free air evident. IMPRESSION: Nasogastric tube tip and side port in stomach. Bowel gas pattern unremarkable. Electronically Signed   By: Lowella Grip III M.D.   On: 11/23/2015 10:45     ASSESSMENT / PLAN:  PULMONARY A: Admit:  Acute hypoxic resp failure with infiltrates  Current:  PCP Pneumonia  ARDS due to Alveolar Hemorrhage possible got worse due to IRS  Nimbex off 11/6. Still requiring FiO2 70% with PEEP 12.   P:   ards net protocol on vent Continue Abx per ID Steroids for PCP   CARDIOVASCULAR A:  Hx of HTN CC 1/17 with no CAD   P:  monitor  RENAL A:   CRI with AKI, improvement in Cr after lasix held 11/19/15 Hypernatremia   P:   Follow urine  output and Cr. 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   - on TF at 10cc/h  P:   PPI NPO  TFs initiated 11/2   HEMATOLOGIC  Recent Labs  11/23/15 0430 11/24/15 0414  HGB 7.1* 7.4*    A:   Anemia of chronic and critical illness- S/p 1uPRBC 11/2 for Hgb 6.5 with good response  DVT protection Thrombocytopenia  P:  SCDs  Transfuse per protocol D/c Bactrim    INFECTIOUS Blood Cx 10/28: NG (final) BAL Culture 10/31: NGTD Pneumocystis Smear 10/31: Positive - Concern for IRIS    A:   HIV/AIDS Trush 11/19/15 PCP 11/17/15 IRIS 11/17/15 (s/p HAART 10/21 - 10/28)    P:   Per ID consult Bactrim (10/20 >>11/6 ), Unasyn (10/29 >>11/5 )  Azithro (10/26 >> ) Clindamycin and Primaquine (11/6 >> )  Solumedrol 40 mg BID  Fluconazole (11/2 >> )  ARVs held starting 10/28 with concern for IRIS    ENDOCRINE A:   DM exacerbated by steroids for Crohn's  P:   SSI  FAMILY  - Updates: Family mom and rfevered and friend Jenny Reichmann updated bedside about  ARDS 11/21/15 at bedside  GLOBAL  only mom and daughter Audery Amel know HIV diagnosis. Rest should not    Phill Myron, D.O. 11/24/2015, 7:15 AM PGY-2, [place]  Attending:  I have seen and examined the patient with nurse practitioner/resident and agree with the note above.  We formulated the plan together and I elicited the following history.    Good vent synchrony No paralytic Desaturation with oral care, movement  On exam Sedated heavily on vent Lungs with crackles, vent supported breaths Belly soft  Labs > hgb, plt, WBC essentially unchanged  ARDS> oxygenation unchanged on same vent settings, increase PEEP today, no WUA, increase sedation, give rocuronium x1 dose now Hypernatremia> add free water per gut HIV> appreciate ID PCP> continue clinda, primaquin Thrombocytopenia> monitor off bactrim  My 40 minutes  Roselie Awkward, MD [place] [facility] Pager: [phone] Cell: [phone] After 3pm or if no response, call [phone]

## 2015-11-24 NOTE — Progress Notes (Signed)
[facility] for Infectious Disease   Reason for visit: Follow up on PCP pneumonia  Interval History: remains intubated; ARDS.    Physical Exam: Constitutional:  Vitals:   11/24/15 0700 11/24/15 0808  BP:    Pulse: (!) 102   Resp: (!) 0   Temp:  99.6 F (37.6 C)  intubated, opens eyes to stimulation HENT: + ET; facial edema Respiratory: CTA B, anterior exam Cardiovascular: tachy RR   Review of Systems: Unable to be assessed due to mental status  Lab Results  Component Value Date   WBC 4.8 11/24/2015   HGB 7.4 (L) 11/24/2015   HCT 23.3 (L) 11/24/2015   MCV 91.0 11/24/2015   PLT 27 (LL) 11/24/2015    Lab Results  Component Value Date   CREATININE 1.41 (H) 11/24/2015   BUN 90 (H) 11/24/2015   NA 150 (H) 11/24/2015   K 3.7 11/24/2015   CL 110 11/24/2015   CO2 29 11/24/2015    Lab Results  Component Value Date   ALT 13 (L) 11/17/2015   AST 29 11/17/2015   ALKPHOS 162 (H) 11/17/2015     Microbiology: Recent Results (from the past 240 hour(s))  Culture, blood (Routine X 2) w Reflex to ID Panel     Status: None   Collection Time: 11/14/15 10:15 AM  Result Value Ref Range Status   Specimen Description BLOOD LEFT ASSIST CONTROL  Final   Special Requests BOTTLES DRAWN AEROBIC AND ANAEROBIC 5CC  Final   Culture NO GROWTH 5 DAYS  Final   Report Status 11/19/2015 FINAL  Final  Culture, blood (Routine X 2) w Reflex to ID Panel     Status: None   Collection Time: 11/14/15 10:15 AM  Result Value Ref Range Status   Specimen Description BLOOD LEFT HAND  Final   Special Requests IN PEDIATRIC BOTTLE 3CC  Final   Culture NO GROWTH 5 DAYS  Final   Report Status 11/19/2015 FINAL  Final  Culture, bal-quantitative     Status: None   Collection Time: 11/17/15  4:28 PM  Result Value Ref Range Status   Specimen Description BRONCHIAL ALVEOLAR LAVAGE  Final   Special Requests NONE  Final   Gram Stain   Final    MODERATE WBC PRESENT, PREDOMINANTLY PMN NO ORGANISMS  SEEN    Culture NO GROWTH 2 DAYS  Final   Report Status 11/20/2015 FINAL  Final  Pneumocystis smear by DFA     Status: None   Collection Time: 11/17/15  4:28 PM  Result Value Ref Range Status   Specimen Source-PJSRC BRONCHIAL ALVEOLAR LAVAGE  Final   Pneumocystis jiroveci Ag POSITIVE  Final    Comment: Performed at [place], READ BACK BY AND VERIFIED WITH: JENNIFER CLIFTON RN BY LM AT [place] ON 11/18/15   Fungus Culture With Stain     Status: None (Preliminary result)   Collection Time: 11/17/15  4:29 PM  Result Value Ref Range Status   Fungus Stain Final report  Final    Comment: (NOTE) Performed At: [facility] [place], [place] [id number] Lindon Romp MD [id number]    Fungus (Mycology) Culture PENDING  Incomplete   Fungal Source BRONCHIAL ALVEOLAR LAVAGE  Final  Fungus Culture Result     Status: None   Collection Time: 11/17/15  4:29 PM  Result Value Ref Range Status   Result 1 Comment  Final    Comment: (NOTE) KOH/Calcofluor  preparation:  no fungus observed. Performed At: [facility] [place], [place] [id number] Lindon Romp MD [id number]     Impression/Plan:  1. PCP pneumonia - on clindamycin and primaquin with steroids and will continue treatment.   2. HIVAIDS - was on treatment but has been off ARVs with concern for IRIS.  On weekly azithromycin.  3.  Thrombocytopenia - multiple possible etiologies.  Have stopped Bactrim 4.  Renal insufficiency - stable 5. ARDS - supportive treatments.

## 2015-11-24 NOTE — Progress Notes (Signed)
Patient's sister Aviva Signs is at bedside. Attempted to call next of kin Sheree without an answer. Aviva Signs was able to reach [facility] and confirmed that she will be present at beside tomorrow morning for family meeting.   Phill Myron, D.O. 11/24/2015, 11:05 AM PGY-2, [place]

## 2015-11-25 ENCOUNTER — Inpatient Hospital Stay (HOSPITAL_COMMUNITY): Payer: BC Managed Care – PPO

## 2015-11-25 LAB — POCT I-STAT 3, ART BLOOD GAS (G3+)
Acid-Base Excess: 3 mmol/L — ABNORMAL HIGH (ref 0.0–2.0)
Bicarbonate: 28.2 mmol/L — ABNORMAL HIGH (ref 20.0–28.0)
O2 SAT: 94 %
PCO2 ART: 49.3 mmHg — AB (ref 32.0–48.0)
TCO2: 30 mmol/L (ref 0–100)
pH, Arterial: 7.365 (ref 7.350–7.450)
pO2, Arterial: 73 mmHg — ABNORMAL LOW (ref 83.0–108.0)

## 2015-11-25 LAB — CBC
HCT: 21.6 % — ABNORMAL LOW (ref 36.0–46.0)
HCT: 27.5 % — ABNORMAL LOW (ref 36.0–46.0)
HEMOGLOBIN: 6.6 g/dL — AB (ref 12.0–15.0)
Hemoglobin: 8.6 g/dL — ABNORMAL LOW (ref 12.0–15.0)
MCH: 28.2 pg (ref 26.0–34.0)
MCH: 28.4 pg (ref 26.0–34.0)
MCHC: 30.6 g/dL (ref 30.0–36.0)
MCHC: 31.3 g/dL (ref 30.0–36.0)
MCV: 92.3 fL (ref 78.0–100.0)
MCV: 90.8 fL (ref 78.0–100.0)
PLATELETS: 23 10*3/uL — AB (ref 150–400)
PLATELETS: 23 10*3/uL — AB (ref 150–400)
RBC: 2.34 MIL/uL — ABNORMAL LOW (ref 3.87–5.11)
RBC: 3.03 MIL/uL — AB (ref 3.87–5.11)
RDW: 18.4 % — AB (ref 11.5–15.5)
RDW: 18.2 % — AB (ref 11.5–15.5)
WBC: 4.9 10*3/uL (ref 4.0–10.5)
WBC: 5.4 10*3/uL (ref 4.0–10.5)

## 2015-11-25 LAB — GLUCOSE, CAPILLARY
GLUCOSE-CAPILLARY: 140 mg/dL — AB (ref 65–99)
GLUCOSE-CAPILLARY: 169 mg/dL — AB (ref 65–99)
GLUCOSE-CAPILLARY: 188 mg/dL — AB (ref 65–99)
Glucose-Capillary: 168 mg/dL — ABNORMAL HIGH (ref 65–99)
Glucose-Capillary: 177 mg/dL — ABNORMAL HIGH (ref 65–99)
Glucose-Capillary: 173 mg/dL — ABNORMAL HIGH (ref 65–99)

## 2015-11-25 LAB — PREPARE RBC (CROSSMATCH)

## 2015-11-25 LAB — BASIC METABOLIC PANEL
Anion gap: 11 (ref 5–15)
BUN: 92 mg/dL — AB (ref 6–20)
CALCIUM: 8.2 mg/dL — AB (ref 8.9–10.3)
CO2: 27 mmol/L (ref 22–32)
CREATININE: 1.31 mg/dL — AB (ref 0.44–1.00)
Chloride: 114 mmol/L — ABNORMAL HIGH (ref 101–111)
GFR, EST AFRICAN AMERICAN: 52 mL/min — AB (ref >60)
GFR, EST NON AFRICAN AMERICAN: 45 mL/min — AB (ref >60)
Glucose, Bld: 192 mg/dL — ABNORMAL HIGH (ref 65–99)
Potassium: 4 mmol/L (ref 3.5–5.1)
SODIUM: 152 mmol/L — AB (ref 135–145)

## 2015-11-25 LAB — PHOSPHORUS: PHOSPHORUS: 4.8 mg/dL — AB (ref 2.5–4.6)

## 2015-11-25 LAB — MAGNESIUM: MAGNESIUM: 2.5 mg/dL — AB (ref 1.7–2.4)

## 2015-11-25 MED ORDER — ROCURONIUM BROMIDE 50 MG/5ML IV SOLN
1.0000 mg/kg | Freq: Once | INTRAVENOUS | Status: AC
  Administered 2015-11-25: 91.7 mg via INTRAVENOUS
  Filled 2015-11-25 (×3): qty 9.17

## 2015-11-25 MED ORDER — FREE WATER
300.0000 mL | Freq: Four times a day (QID) | Status: DC
  Administered 2015-11-25 – 2015-11-26 (×4): 300 mL

## 2015-11-25 MED ORDER — FLUCONAZOLE IN SODIUM CHLORIDE 100-0.9 MG/50ML-% IV SOLN
100.0000 mg | INTRAVENOUS | Status: DC
  Administered 2015-11-25 – 2015-11-26 (×2): 100 mg via INTRAVENOUS
  Filled 2015-11-25 (×5): qty 50

## 2015-11-25 MED ORDER — HYDRALAZINE HCL 20 MG/ML IJ SOLN
10.0000 mg | INTRAMUSCULAR | Status: DC | PRN
  Administered 2015-11-25 (×2): 40 mg via INTRAVENOUS
  Filled 2015-11-25 (×3): qty 2
  Filled 2015-11-25: qty 1

## 2015-11-25 MED ORDER — METHYLPREDNISOLONE SODIUM SUCC 40 MG IJ SOLR
30.0000 mg | Freq: Two times a day (BID) | INTRAMUSCULAR | Status: DC
  Administered 2015-11-25 – 2015-11-26 (×3): 30 mg via INTRAVENOUS
  Filled 2015-11-25 (×4): qty 0.75

## 2015-11-25 MED ORDER — SODIUM CHLORIDE 0.9 % IV SOLN: Freq: Once | INTRAVENOUS | Status: DC

## 2015-11-25 MED ORDER — METOPROLOL TARTRATE 5 MG/5ML IV SOLN
2.5000 mg | INTRAVENOUS | Status: DC | PRN
  Administered 2015-11-25 – 2015-11-26 (×3): 5 mg via INTRAVENOUS
  Filled 2015-11-25 (×4): qty 5

## 2015-11-25 NOTE — Progress Notes (Signed)
eLink Physician-Brief Progress Note Patient Name: Nancy Acevedo DOB: 1960/03/07 MRN: [id number]   Date of Service  11/25/2015  HPI/Events of Note  Hypertension, tachycardia  eICU Interventions  PRN hydralazine dose adjusted  PRN metoprolol to maintain HR < 110/min     Intervention Category Intermediate Interventions: Hypertension - evaluation and management;Arrhythmia - evaluation and management  Wilhelmina Mcardle 11/25/2015, 6:26 PM

## 2015-11-25 NOTE — Progress Notes (Signed)
Pharmacy Antibiotic Note  Allyson Tineo is a 55 y.o. female admitted on 11/10/2015 with new HIV dx, PCP pneumonia and possible aspiration penumonia.  Pharmacy has been consulted for clindamycin, primaquine, and fluconazole . Her renal function has been improving, current SCr1.31, est CrCl ~54 ml/min.   Plan: -Continue clindamycin 900 mg IV q8h -Continue primaquine 30 mg daily -Increase fluconazole 100 mg qd -Continue azithromycin 1200 mg  -Monitor renal function and clinical course & LOT -F/U restarting ART  Height: 5' 4" (162.6 cm) Weight: 202 lb 2.6 oz (91.7 kg) IBW/kg (Calculated) : 54.7  Temp (24hrs), Avg:98 F (36.7 C), Min:97.4 F (36.3 C), Max:98.6 F (37 C)   Recent Labs Lab 11/21/15 0435 11/22/15 0440 11/23/15 0430 11/24/15 0414 11/25/15 0350  WBC 4.2 4.2 4.8 4.8 4.9  CREATININE 2.16* 1.55* 1.29* 1.41* 1.31*    Estimated Creatinine Clearance: 53.9 mL/min (by C-G formula based on SCr of 1.31 mg/dL (H)).    Allergies  Allergen Reactions  . Lexapro [Escitalopram] Itching  . Losartan   . Spironolactone     rash  . Clonidine Derivatives Palpitations    Antimicrobials this admission: Vanco 10/17 x1 (750 mg); 10/19 x 1 (1g) Zosyn 10/17 >>10/22 Cipro 10/13 >> 10/16 Flagyl 10/13 >> 10/16 Bactrim 10/20 (switched to po on 10/26; changed back to IV 10/30)>>11/6 Unasyn 10/29 >> 11/5 Azithromycin 10/26 weekly >> Fluconazole 11/2 >>  Clindamycin 11/6 >> Primaquine 11/6 >>  Dose adjustments this admission: Bactrim dose increased to 400 mg q12h (11/2) Fluconazole increase to 100 mg daily for renal function improvement (11/8)  Microbiology results: 10/14 Cdiff >> negative 10/16 Quantiferon Gold >> indeterminate so PPD placed 10/17 - read as negative 10/17 BCx: neg 10/17 MRSA PCR: neg 10/19 BCx2: neg 10/19 Cdif: neg x2 10/20 HIV: pos 10/21 HIV RNA: ip 10/21 CD4: <10 10/21 HLA: neg 10/21 Resp PCR: neg 10/28 BCx: ngtd - final  10/31 BAL (fungus): candida  albicans  10/31 BAL (AFB): Sent 10/31 BAL cx: ngtd  10/31 BAL (pneumocystis): pos  Imagene Riches 11/25/2015 12:04 PM

## 2015-11-25 NOTE — Progress Notes (Signed)
PULMONARY / CRITICAL CARE MEDICINE   Name: Nancy Acevedo MRN: [id number] DOB: Dec 04, 1960    ADMISSION DATE:  10/22/2015 CONSULTATION DATE:  10/20  REFERRING MD:  [facility]  CHIEF COMPLAINT: Can't breathe  Brief:  55 Y/O with hypertension, chronic kidney disease stage II, OSA, GERD, and recent diagnosis of Crohn's colitis managed with prednisone Admittedwith resp distress, fevers, B/L infitrates.  She was diagnosed with HIV, AIDS CD4 count <4. Presumed PCP vs bacterial PNA. Getting treatment with Abx, bactrim and steroids. Also on HAART therapy as per ID  LINES/TUBES: PICC 10/15 >>  ETT 10/31 >>  Foley 11/1 >>  NG/OT 11/1 >>   Events 10/19/2015  -admit  10/14 - fever resolved 11/02/15 - fever back 10/17 -- MRSA Pcr negative 11/06/15- ICU tx 11/07/15 - HIV +ve from10/20/17, RVP  Negative, C Diff negative .STart bipap and lasix. ID consult -> Rx PCP (DO NOT DISCLOSE TO FAMILY). AUTOIMMUNE ORDERED 10/29- Re consult for ongoing hypoxia 10/31 Increasing oxygen demands on 100% BiPap, unable to transition to [facility]; Intubated & BAL perfomed in right middle lobe  11/1: Family meeting where diagnosis disclosed to [name] and mother  11/2: Received 1u PRBC for HgB <7. Started TF.  11/20/15 - started nimbex for severe ARDS 11/21/15 - severe ards persists 70% fio2, peep 12. Not on pressors.Making urine. Creat better. Mother at bedside with reverend and her boyfriend Jenny Reichmann 11/22/15: Met indication for prone ventilation at 70% fio2/peep 12 w/ pulse ox 91%. AKI improving.  11/23/15: Platelets low to 29. Switched Bactrim to Clinda/Primaquin. Discontinued Nimbex and prone protocol.  11/24/15: Weaned FiO2 to 70% without any desaturations.    SUBJECTIVE/OVERNIGHT/INTERVAL HX 11/25/15: Afebrile overnight. HgB low to 6.6 this morning. Transfusion with 1uPRBC initiated. No desaturations with weaning on Vent. Adequately sedated not requiring any prn medications. Sync well with vent. Daughter Sheree  plans to be at bedside this morning for a family meeting.    VITAL SIGNS: BP 108/70   Pulse 88   Temp 97.6 F (36.4 C) (Oral)   Resp (!) 35   Ht '5\' 4"'$  (1.626 m)   Wt 202 lb 2.6 oz (91.7 kg)   SpO2 96%   BMI 34.70 kg/m   HEMODYNAMICS:    VENTILATOR SETTINGS: Vent Mode: PRVC FiO2 (%):  [70 %-100 %] 70 % Set Rate:  [35 bmp] 35 bmp Vt Set:  [380 mL] 380 mL PEEP:  [12 cmH20-14 cmH20] 14 cmH20 Plateau Pressure:  [28 cmH20-35 cmH20] 28 cmH20  INTAKE / OUTPUT: I/O last 3 completed shifts: In: 1062 [I.V.:1952; NG/GT:2270; IV Piggyback:275] Out: 3310 [Urine:2740; Emesis/NG output:320; Stool:250]  PHYSICAL EXAMINATION: General:  Critically ill looking  Neuro: RASS -5.  HEENT: ETT in place. Eyes closed.  Cardiovascular:  RRR, Nl S1/S2, No MRG. Lungs:  Scattered crackles, rhonchi. Abdomen:  soft +bs, non-distended  Skin: warm and dry  LABS:  PULMONARY  Recent Labs Lab 11/20/15 1640 11/20/15 2120 11/21/15 0329 11/23/15 0951 11/24/15 0804  PHART 7.241* 7.317* 7.325* 7.390 7.338*  PCO2ART 47.4 41.7 42.7 55.2* 56.5*  PO2ART 64.5* 52.9* 67.0* 57.0* 57.8*  HCO3 19.7* 20.8 22.3 33.5* 29.5*  TCO2  --   --  24 35  --   O2SAT 87.7 84.1 92.0 88.0 86.2   CBC  Recent Labs Lab 11/23/15 0430 11/24/15 0414 11/25/15 0350  HGB 7.1* 7.4* 6.6*  HCT 22.5* 23.3* 21.6*  WBC 4.8 4.8 4.9  PLT 29* 27* 23*   COAGULATION No results for input(s): INR in the last 168  hours.  CARDIAC  No results for input(s): TROPONINI in the last 168 hours. No results for input(s): PROBNP in the last 168 hours.  CHEMISTRY  Recent Labs Lab 11/21/15 0435 11/22/15 0440 11/23/15 0430 11/24/15 0414 11/25/15 0350  NA 139 144 147* 150* 152*  K 3.3* 2.5* 3.5 3.7 4.0  CL 105 106 107 110 114*  CO2 '24 26 30 29 27  '$ GLUCOSE 205* 125* 166* 194* 192*  BUN 89* 84* 79* 90* 92*  CREATININE 2.16* 1.55* 1.29* 1.41* 1.31*  CALCIUM 7.5* 7.5* 7.4* 7.9* 8.2*  MG 2.3 2.3 2.2 2.5* 2.5*  PHOS 4.7* 3.6 3.2 3.7  4.8*   Estimated Creatinine Clearance: 53.9 mL/min (by C-G formula based on SCr of 1.31 mg/dL (H)).  LIVER No results for input(s): AST, ALT, ALKPHOS, BILITOT, PROT, ALBUMIN, INR in the last 168 hours. INFECTIOUS No results for input(s): LATICACIDVEN, PROCALCITON in the last 168 hours.  ENDOCRINE CBG (last 3)   Recent Labs  11/24/15 1939 11/24/15 2332 11/25/15 0342  GLUCAP 151* 140* 168*   IMAGING x48h  - image(s) personally visualized  -   highlighted in bold Dg Chest Port 1 View  Result Date: 11/24/2015 CLINICAL DATA:  Ventilator dependence EXAM: PORTABLE CHEST 1 VIEW COMPARISON:  11/23/2015; 11/19/2015; 11/07/2015 FINDINGS: Grossly unchanged cardiac silhouette and mediastinal contours. Interval advancement of endotracheal tube with tip now approximately 4.3 cm above the carina. Otherwise, stable position of support apparatus. No pneumothorax. Minimally improved aeration of the lungs with persistent bilateral mid and lower lung perihilar predominant heterogeneous airspace opacities, right greater than left. No new focal airspace opacities. No pleural effusion. No acute osseus abnormalities. IMPRESSION: 1. Appropriately positioned support apparatus as above. No pneumothorax. 2. Minimally improved aeration of the lungs suggests improved edema and/or atelectasis. 3. Persistent bilateral mid and lower lung perihilar predominant opacities with broad differential considerations including pulmonary edema, aspiration, multi focal infection and (less likely) pulmonary hemorrhage. Electronically Signed   By: Sandi Mariscal M.D.   On: 11/24/2015 09:14   Dg Chest Port 1 View  Result Date: 11/23/2015 CLINICAL DATA:  Hypoxia EXAM: PORTABLE CHEST 1 VIEW COMPARISON:  November 22, 2015 FINDINGS: Endotracheal tube tip is 7.6 cm above the carina. Nasogastric tube tip in stomach. There is no pneumothorax. There is airspace consolidation throughout the right mid and lower lung zones. There is diffuse interstitial  edema throughout the lungs bilaterally. Heart is upper normal in size with pulmonary vascularity within normal limits. No adenopathy. No bone lesions. IMPRESSION: Tube positions as described without pneumothorax. Widespread interstitial edema throughout the lungs with airspace consolidation throughout the right mid and lower lung zones. Suspect pneumonia on the right. Suspect widespread pulmonary edema with likely superimposed pneumonia, particularly in the right mid and lower lung zones. Stable cardiac silhouette. Electronically Signed   By: Lowella Grip III M.D.   On: 11/23/2015 10:46   Dg Abd Portable 1v  Result Date: 11/23/2015 CLINICAL DATA:  Nasogastric tube placement EXAM: PORTABLE ABDOMEN - 1 VIEW COMPARISON:  November 17, 2015 FINDINGS: Nasogastric tube tip and side port are in the stomach. The bowel gas pattern is unremarkable. No free air evident. IMPRESSION: Nasogastric tube tip and side port in stomach. Bowel gas pattern unremarkable. Electronically Signed   By: Lowella Grip III M.D.   On: 11/23/2015 10:45     ASSESSMENT / PLAN:  PULMONARY A: Admit:  Acute hypoxic resp failure with infiltrates  Current:  PCP Pneumonia  ARDS due to Alveolar Hemorrhage possible got worse  due to IRS  Nimbex off 11/6. Requiring FiO2 70% with PEEP 14.   P:   ards net protocol on vent Continue Abx per ID Steroids for PCP   CARDIOVASCULAR A:  Hx of HTN CC 1/17 with no CAD   P:  monitor  RENAL A:   CRI with AKI, improvement in Cr after lasix held 11/19/15 Hypernatremia   P:   Follow urine output and Cr. 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   P:   PPI NPO  TFs initiated 11/2   HEMATOLOGIC  Recent Labs  11/24/15 0414 11/25/15 0350  HGB 7.4* 6.6*    A:   Anemia of chronic and critical illness- S/p 1uPRBC 11/2   DVT protection Thrombocytopenia  P:  SCDs  Transfuse per protocol, 1uPRBC today 11/8  D/c Bactrim     INFECTIOUS Blood Cx 10/28: NG (final) BAL Culture 10/31: NGTD Pneumocystis Smear 10/31: Positive - Concern for IRIS    A:   HIV/AIDS Trush 11/19/15 PCP 11/17/15 IRIS 11/17/15 (s/p HAART 10/21 - 10/28)    P:   Per ID consult Bactrim (10/20 >>11/6 ), Unasyn (10/29 >>11/5 )  Azithro (10/26 >> ) Clindamycin and Primaquine (11/6 >> )  Solumedrol 40 mg BID  Fluconazole (11/2 >> )  ARVs held starting 10/28 with concern for IRIS    ENDOCRINE A:   DM exacerbated by steroids for Crohn's  P:   Lantus 15u  SSI  FAMILY  - Updates: Plan for family meeting during rounds 11/25/15   GLOBAL  only mom and daughter Audery Amel know HIV diagnosis. Rest should not    Phill Myron, D.O. 11/25/2015, 7:10 AM PGY-2, [place]  Attending:  I have seen and examined the patient with nurse practitioner/resident and agree with the note above.  We formulated the plan together and I elicited the following history.    No issues with dyssynchrony or hypoxemia overnight Sodium elevated again  On exam  Hypernatremia > increase free water to 380m q6h Anemia without bleeding> transfuse 1 U pRBC today ARDS> continue high sedation, ARDS protocol, wean FiO2 to maintain O2 saturation > 90%, goal paO2 55-65 HIV> continue current management per ID PCP pneumonia> continue clindamycin and primaquin, decrase dose of steroids  Code status: DNR, no escalation of care  See plan of care note from today  My cc time 35 minutes   BRoselie Awkward MD LOcean AcresPCCM Pager: [phone]: [phone] 3pm or if no response, call [phone]

## 2015-11-25 NOTE — Progress Notes (Signed)
[facility] Progress Note Patient Name: Nancy Acevedo DOB: [birth year] MRN: [id number]   Date of Service  11/25/2015  HPI/Events of Note  Anemia- Hgb = 6.6.  eICU Interventions  Will transfuse 1 unit PRBC now.      Intervention Category Intermediate Interventions: Other:  Lysle Dingwall 11/25/2015, 5:58 AM

## 2015-11-25 NOTE — Plan of Care (Signed)
  Interdisciplinary Goals of Care Family Meeting   Date carried out:: 11/25/2015  Location of the meeting: Bedside  Member's involved: Physician and Bedside Registered Nurse, Social Worker and Family Member or next of kin  Durable Power of Attorney or acting medical decision maker: Daughter and mother present    Discussion: We discussed goals of care for Nancy Acevedo .  I explained that she has severe ARDS from PCP pneumonia and has not progressed. I explained that her chances of survival are close to zero and that I worry we are perpetuating suffering. However her family would like to continue current efforts rather than withdraw care.  We decided to make her code status DNR in the event of a cardiac arrest (no CPR, no shocks) but will not escalate care further.  If she worsens we will make her comfortable.  Code status: Limited Code or DNR with short term continued therapy as detailed above.  Disposition: Continue current acute care  Time spent for the meeting: 30 minutes  Nancy Acevedo 11/25/2015, 10:44 AM

## 2015-11-25 NOTE — Progress Notes (Signed)
Interim Progress Note Subjective:  Patient with ARDS. RN has concern about her desynchrony with ventilator. She is breathing at 44/minute. Vent rate was set at 35. She was given Fentanyl bolus 400 mcg in addition to fentanyl gtt at 3ml/hr. She also received 4 mg of bolus versed twice on top of her gtt at 45ml/hr in the last two hours. She was sating in upper 90's. ABG 7.3/49/73/30. Exam is remarkable for obtunded and intubated patient. Lung sounds present bilaterally without wheeze or crackles.   A/P:  Patient with ARDS now with Desynchrony with Vent. -Recuronium 1mg /kg once -Discussed patient with Dr. Lake Bells over the phone Mercy Riding, MD 11/25/2015, 2:59 PM PGY-2, [place] [facility] Service pager [phone]

## 2015-11-26 ENCOUNTER — Inpatient Hospital Stay (HOSPITAL_COMMUNITY): Payer: BC Managed Care – PPO

## 2015-11-26 LAB — GLUCOSE, CAPILLARY
GLUCOSE-CAPILLARY: 60 mg/dL — AB (ref 65–99)
GLUCOSE-CAPILLARY: 96 mg/dL (ref 65–99)
Glucose-Capillary: 115 mg/dL — ABNORMAL HIGH (ref 65–99)
Glucose-Capillary: 117 mg/dL — ABNORMAL HIGH (ref 65–99)
Glucose-Capillary: 140 mg/dL — ABNORMAL HIGH (ref 65–99)
Glucose-Capillary: 59 mg/dL — ABNORMAL LOW (ref 65–99)

## 2015-11-26 LAB — CBC
HCT: 28.9 % — ABNORMAL LOW (ref 36.0–46.0)
Hemoglobin: 8.9 g/dL — ABNORMAL LOW (ref 12.0–15.0)
MCH: 28.4 pg (ref 26.0–34.0)
MCHC: 30.8 g/dL (ref 30.0–36.0)
MCV: 92.3 fL (ref 78.0–100.0)
PLATELETS: 24 10*3/uL — AB (ref 150–400)
RBC: 3.13 MIL/uL — ABNORMAL LOW (ref 3.87–5.11)
RDW: 19.4 % — AB (ref 11.5–15.5)
WBC: 5.3 10*3/uL (ref 4.0–10.5)

## 2015-11-26 LAB — BASIC METABOLIC PANEL
Anion gap: 11 (ref 5–15)
BUN: 91 mg/dL — AB (ref 6–20)
CO2: 26 mmol/L (ref 22–32)
CREATININE: 1.2 mg/dL — AB (ref 0.44–1.00)
Calcium: 8.3 mg/dL — ABNORMAL LOW (ref 8.9–10.3)
Chloride: 116 mmol/L — ABNORMAL HIGH (ref 101–111)
GFR calc Af Amer: 58 mL/min — ABNORMAL LOW (ref >60)
GFR, EST NON AFRICAN AMERICAN: 50 mL/min — AB (ref >60)
Glucose, Bld: 124 mg/dL — ABNORMAL HIGH (ref 65–99)
Potassium: 4.6 mmol/L (ref 3.5–5.1)
SODIUM: 153 mmol/L — AB (ref 135–145)

## 2015-11-26 LAB — PHOSPHORUS: Phosphorus: 4.9 mg/dL — ABNORMAL HIGH (ref 2.5–4.6)

## 2015-11-26 LAB — ACID FAST SMEAR (AFB): ACID FAST SMEAR - AFSCU2: NEGATIVE

## 2015-11-26 LAB — MAGNESIUM: MAGNESIUM: 2.4 mg/dL (ref 1.7–2.4)

## 2015-11-26 LAB — ACID FAST SMEAR (AFB, MYCOBACTERIA)

## 2015-11-26 MED ORDER — CLONAZEPAM 0.5 MG PO TBDP
3.0000 mg | ORAL_TABLET | Freq: Three times a day (TID) | ORAL | Status: DC
  Administered 2015-11-26 (×2): 3 mg
  Filled 2015-11-26 (×2): qty 6

## 2015-11-26 MED ORDER — CLONAZEPAM 0.5 MG PO TBDP
2.0000 mg | ORAL_TABLET | Freq: Three times a day (TID) | ORAL | Status: DC
  Administered 2015-11-26: 2 mg
  Filled 2015-11-26: qty 4

## 2015-11-26 MED ORDER — FREE WATER
300.0000 mL | Status: DC
  Administered 2015-11-26 – 2015-11-27 (×6): 300 mL

## 2015-11-26 MED ORDER — DEXTROSE 50 % IV SOLN
INTRAVENOUS | Status: AC
  Administered 2015-11-26: 50 mL
  Filled 2015-11-26: qty 50

## 2015-11-26 MED ORDER — METHADONE HCL 10 MG PO TABS
20.0000 mg | ORAL_TABLET | Freq: Two times a day (BID) | ORAL | Status: DC
  Administered 2015-11-26: 20 mg
  Filled 2015-11-26: qty 2

## 2015-11-26 MED ORDER — ROCURONIUM BROMIDE 50 MG/5ML IV SOLN
1.0000 mg/kg | Freq: Once | INTRAVENOUS | Status: AC
  Administered 2015-11-26: 92.3 mg via INTRAVENOUS
  Filled 2015-11-26: qty 9.23

## 2015-11-26 MED ORDER — METHADONE HCL 10 MG PO TABS
10.0000 mg | ORAL_TABLET | Freq: Two times a day (BID) | ORAL | Status: DC
  Administered 2015-11-26: 10 mg
  Filled 2015-11-26: qty 1

## 2015-11-26 NOTE — Progress Notes (Signed)
[facility] for Infectious Disease   Reason for visit: Follow up on PCP pneumonia  Interval History: remains intubated; ARDS.    Physical Exam: Constitutional:  Vitals:   11/26/15 0740 11/26/15 0800  BP:  129/81  Pulse:  (!) 137  Resp:  18  Temp: (!) 102.9 F (39.4 C)   intubated, does not respond to stimulation HENT: + ET; facial edema; + dried blood on lip Respiratory: CTA B, anterior exam Cardiovascular: tachy RR GI: soft   Review of Systems: Unable to be assessed due to mental status  Lab Results  Component Value Date   WBC 5.3 11/26/2015   HGB 8.9 (L) 11/26/2015   HCT 28.9 (L) 11/26/2015   MCV 92.3 11/26/2015   PLT 24 (LL) 11/26/2015    Lab Results  Component Value Date   CREATININE 1.20 (H) 11/26/2015   BUN 91 (H) 11/26/2015   NA 153 (H) 11/26/2015   K 4.6 11/26/2015   CL 116 (H) 11/26/2015   CO2 26 11/26/2015    Lab Results  Component Value Date   ALT 13 (L) 11/17/2015   AST 29 11/17/2015   ALKPHOS 162 (H) 11/17/2015     Microbiology: Recent Results (from the past 240 hour(s))  Culture, bal-quantitative     Status: None   Collection Time: 11/17/15  4:28 PM  Result Value Ref Range Status   Specimen Description BRONCHIAL ALVEOLAR LAVAGE  Final   Special Requests NONE  Final   Gram Stain   Final    MODERATE WBC PRESENT, PREDOMINANTLY PMN NO ORGANISMS SEEN    Culture NO GROWTH 2 DAYS  Final   Report Status 11/20/2015 FINAL  Final  Pneumocystis smear by DFA     Status: None   Collection Time: 11/17/15  4:28 PM  Result Value Ref Range Status   Specimen Source-PJSRC BRONCHIAL ALVEOLAR LAVAGE  Final   Pneumocystis jiroveci Ag POSITIVE  Final    Comment: Performed at [place], READ BACK BY AND VERIFIED WITH: JENNIFER CLIFTON RN BY LM AT [place] ON 11/18/15   Fungus Culture With Stain     Status: None   Collection Time: 11/17/15  4:29 PM  Result Value Ref Range Status   Fungus Stain Final report  Final     Fungus (Mycology) Culture Preliminary report  Final    Comment: (NOTE) Performed At: [facility] [address] [place], [place] [id number] Lindon Romp MD [id number]    Fungal Source BRONCHIAL ALVEOLAR LAVAGE  Final  Fungus Culture Result     Status: None   Collection Time: 11/17/15  4:29 PM  Result Value Ref Range Status   Result 1 Comment  Final    Comment: (NOTE) KOH/Calcofluor preparation:  no fungus observed. Performed At: [facility] [place], [place] [id number] Lindon Romp MD [id number]   Fungal organism reflex     Status: None   Collection Time: 11/17/15  4:29 PM  Result Value Ref Range Status   Fungal result 1 Candida albicans  Final    Comment: (NOTE) Light growth Performed At: [facility] [place], [place] [id number] Lindon Romp MD [id number]     Impression/Plan:  1. PCP pneumonia - on clindamycin and primaquin with steroids and will continue treatment.  N 2. HIVAIDS - was on treatment but has been off ARVs with concern for IRIS.  On weekly azithromycin.   I would continue off of  ARVs at this point with concern for IRIS and worsening.   3.  Thrombocytopenia - multiple possible etiologies.  Have stopped Bactrim.  Unchanged.  4. ARDS - supportive treatments. No escalation per family.  Prognosis poor.    Dr. Megan Salon will be available tomorrow and over the weekend if needed.  I will follow up again on Monday otherwise.

## 2015-11-26 NOTE — Progress Notes (Signed)
Inpatient Diabetes Program Recommendations  AACE/ADA: New Consensus Statement on Inpatient Glycemic Control (2015)  Target Ranges:  Prepandial:   less than 140 mg/dL      Peak postprandial:   less than 180 mg/dL (1-2 hours)      Critically ill patients:  140 - 180 mg/dL   Lab Results  Component Value Date   GLUCAP 60 (L) 11/26/2015   HGBA1C 6.6 (H) 10/02/2015    Review of Glycemic ControlResults for MERILYNN, METTERT (MRN [id number]) as of 11/26/2015 15:03  Ref. Range 11/26/2015 04:10 11/26/2015 07:37 11/26/2015 11:17  Glucose-Capillary Latest Ref Range: 65 - 99 mg/dL 115 (H) 59 (L) 60 (L)   Inpatient Diabetes Program Recommendations:    Note blood sugars reduced.  Consider d/c of Lantus.  Called and discussed with RN.  Thanks, Adah Perl, RN, BC-ADM Inpatient Diabetes Coordinator Pager [phone] (8a-5p)

## 2015-11-26 NOTE — Care Management Note (Signed)
Case Management Note  Patient Details  Name: Nancy Acevedo MRN: [id number] Date of Birth: 1960-07-02  Subjective/Objective:     Pt originally admitted with colitis - unfortunately found to have advanced AIDs                Action/Plan:  PTA independent from home with daughters.  Attending informed both oldest daughter and pts mom of infectious disease status.  CM will continue to follow for discharge needs   Expected Discharge Date:                  Expected Discharge Plan:  [place]  In-House Referral:  Clinical Social Work  Discharge planning Services  CM Consult  Post Acute Care Choice:    Choice offered to:     DME Arranged:    DME Agency:     HH Arranged:    [place] Agency:     Status of Service:  In process, will continue to follow  If discussed at Long Length of Stay Meetings, dates discussed:  11/19/15, 11/24/15, 11/26/15    Additional Comments: 11/26/2015 Discussed in LOS;  Pt remains appropriate for continued stay.  Pt is on the ventilator FIO2 had to be increased overnight to 100%, pt is non responsive, febrile and tachy.  Per family ; care to remain as is with no escalation - very poor prognosis per attending and consulting doctors.  Family meetings for goals of care are ongoing Maryclare Labrador, RN 11/26/2015, 9:15 AM

## 2015-11-26 NOTE — Progress Notes (Signed)
[facility] PCCM  Lengthy discussion with daughter today.  She says that her aunts have been pressuring her to transfer her to another hospital, but she says that she and her grandmother feel the best approach is to withdraw care.  She says that she thinks her aunts would like to hear from [place].  I said we will need to have a family conversation on Saturday 11/11.  She will make arrangements.  Roselie Awkward, MD [place] PCCM Pager: [phone] Cell: [phone] After 3pm or if no response, call [phone]

## 2015-11-26 NOTE — Progress Notes (Signed)
PULMONARY / CRITICAL CARE MEDICINE   Name: Nancy Acevedo MRN: [id number] DOB: 1960/12/25    ADMISSION DATE:  10/29/2015 CONSULTATION DATE:  10/20  REFERRING MD:  [facility]  CHIEF COMPLAINT: Can't breathe  Brief:  55 Y/O with hypertension, chronic kidney disease stage II, OSA, GERD, and recent diagnosis of Crohn's colitis managed with prednisone Admittedwith resp distress, fevers, B/L infitrates.  She was diagnosed with HIV, AIDS CD4 count <4. Presumed PCP vs bacterial PNA. Getting treatment with Abx, bactrim and steroids. Also on HAART therapy as per ID  LINES/TUBES: PICC 10/15 >>  ETT 10/31 >>  Foley 11/1 >>  NG/OT 11/1 >>   Events 11/17/2015  -admit  10/14 - fever resolved 11/02/15 - fever back 10/17 -- MRSA Pcr negative 11/06/15- ICU tx 11/07/15 - HIV +ve from10/20/17, RVP  Negative, C Diff negative .STart bipap and lasix. ID consult -> Rx PCP (DO NOT DISCLOSE TO FAMILY). AUTOIMMUNE ORDERED 10/29- Re consult for ongoing hypoxia 10/31 Increasing oxygen demands on 100% BiPap, unable to transition to [facility]; Intubated & BAL perfomed in right middle lobe  11/1: Family meeting where diagnosis disclosed to daughter Nancy Acevedo and mother  11/2: Received 1u PRBC for HgB <7. Started TF.  11/20/15 - started nimbex for severe ARDS 11/21/15 - severe ards persists 70% fio2, peep 12. Not on pressors.Making urine. Creat better. Mother at bedside with reverend and her boyfriend Nancy Acevedo 11/22/15: Met indication for prone ventilation at 70% fio2/peep 12 w/ pulse ox 91%. AKI improving.  11/23/15: Platelets low to 29. Switched Bactrim to Clinda/Primaquin. Discontinued Nimbex and prone protocol.  11/24/15: Weaned FiO2 to 70% without any desaturations.  11/25/15: HgB 6.6. Transfused with 1u PRBC with good response. Family meeting >> DNR status, will not escalate care and plan for comfort care if worsens    SUBJECTIVE/OVERNIGHT/INTERVAL HX 11/26/15: Febrile overnight to 102.9 and tachycardic to the 130s. Mostly  synced with vent but gets off sync with painful stimuli. Otherwise not responsive.   VITAL SIGNS: BP 131/90 (BP Location: Right Arm)   Pulse (!) 134   Temp (!) 101.7 F (38.7 C) (Oral)   Resp (!) 28   Ht '5\' 4"'$  (1.626 m)   Wt 203 lb 7.8 oz (92.3 kg)   SpO2 98%   BMI 34.93 kg/m   HEMODYNAMICS:    VENTILATOR SETTINGS: Vent Mode: PRVC FiO2 (%):  [70 %-100 %] 100 % Set Rate:  [35 bmp] 35 bmp Vt Set:  [380 mL] 380 mL PEEP:  [14 cmH20] 14 cmH20 Plateau Pressure:  [25 ERX54-00 cmH20] 26 cmH20  INTAKE / OUTPUT: I/O last 3 completed shifts: In: 8676 [I.V.:2132; Blood:335; NG/GT:3105; IV Piggyback:275] Out: 9 [Urine:2880; Emesis/NG output:320; Stool:750]  PHYSICAL EXAMINATION: General:  Critically ill looking  Neuro: RASS -5. Increased RR with painful stimuli but does not withdraw. Not responsive to voice.  HEENT: ETT in place. Eyes closed.  Cardiovascular:  RRR, Nl S1/S2, No MRG. Lungs:  Scattered crackles, rhonchi. Abdomen:  soft +bs, non-distended  Skin: warm and dry  LABS:  PULMONARY  Recent Labs Lab 11/20/15 2120 11/21/15 0329 11/23/15 0951 11/24/15 0804 11/25/15 1455  PHART 7.317* 7.325* 7.390 7.338* 7.365  PCO2ART 41.7 42.7 55.2* 56.5* 49.3*  PO2ART 52.9* 67.0* 57.0* 57.8* 73.0*  HCO3 20.8 22.3 33.5* 29.5* 28.2*  TCO2  --  24 35  --  30  O2SAT 84.1 92.0 88.0 86.2 94.0   CBC  Recent Labs Lab 11/25/15 0350 11/25/15 1200 11/26/15 0410  HGB 6.6* 8.6* 8.9*  HCT 21.6*  27.5* 28.9*  WBC 4.9 5.4 5.3  PLT 23* 23* PENDING   COAGULATION No results for input(s): INR in the last 168 hours.  CARDIAC  No results for input(s): TROPONINI in the last 168 hours. No results for input(s): PROBNP in the last 168 hours.  CHEMISTRY  Recent Labs Lab 11/22/15 0440 11/23/15 0430 11/24/15 0414 11/25/15 0350 11/26/15 0410  NA 144 147* 150* 152* 153*  K 2.5* 3.5 3.7 4.0 4.6  CL 106 107 110 114* 116*  CO2 _0 GLUCOSE 125* 166* 194* 192* 124*  BUN  84* 79* 90* 92* 91*  CREATININE 1.55* 1.29* 1.41* 1.31* 1.20*  CALCIUM 7.5* 7.4* 7.9* 8.2* 8.3*  MG 2.3 2.2 2.5* 2.5* 2.4  PHOS 3.6 3.2 3.7 4.8* 4.9*   Estimated Creatinine Clearance: 59 mL/min (by C-G formula based on SCr of 1.2 mg/dL (H)).  LIVER No results for input(s): AST, ALT, ALKPHOS, BILITOT, PROT, ALBUMIN, INR in the last 168 hours. INFECTIOUS No results for input(s): LATICACIDVEN, PROCALCITON in the last 168 hours.  ENDOCRINE CBG (last 3)   Recent Labs  11/25/15 1945 11/25/15 2340 11/26/15 0410  GLUCAP 169* 140* 115*   IMAGING x48h  - image(s) personally visualized  -   highlighted in bold Dg Chest Port 1 View  Result Date: 11/25/2015 CLINICAL DATA:  Acute respiratory failure EXAM: PORTABLE CHEST 1 VIEW COMPARISON:  11/24/2015 FINDINGS: Cardiac shadow is stable. An endotracheal tube, nasogastric catheter and right-sided PICC line are again seen and stable. Diffuse bilateral infiltrates are again identified unchanged. No new focal abnormality is seen. IMPRESSION: No change from the prior exam. Diffuse bilateral infiltrates are again identified. Electronically Signed   By: Inez Catalina M.D.   On: 11/25/2015 08:52     ASSESSMENT / PLAN:  PULMONARY A: Admit:  Acute hypoxic resp failure with infiltrates  Current:  PCP Pneumonia  ARDS due to Alveolar Hemorrhage possible got worse due to IRS  Nimbex off 11/6. Requiring FiO2 100% with PEEP 14.   P:   ards net protocol on vent Continue Abx per ID Steroids for PCP Switched to DNR status, will not escalate care, comfort care if worsens    CARDIOVASCULAR A:  Hx of HTN CC 1/17 with no CAD Acute- hypertensive and tachycardic    P:  Increased prn hydralazine and metoprolol  Continue metoprolol 12.5 mg BID   RENAL A:   CRI with AKI, improvement in Cr after lasix held 11/19/15 Hypernatremia   P:   Follow urine output and Cr. 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   P:   PPI NPO  TFs initiated 11/2   HEMATOLOGIC  Recent Labs  11/25/15 1200 11/26/15 0410  HGB 8.6* 8.9*    A:   Anemia of chronic and critical illness- S/p 1uPRBC 11/2  DVT protection Thrombocytopenia  P:  SCDs  Transfuse per protocol, 1uPRBC today 11/8  D/c Bactrim     INFECTIOUS Blood Cx 10/28: NG (final) BAL Culture 10/31: NGTD Pneumocystis Smear 10/31: Positive - Concern for IRIS    A:   HIV/AIDS Trush 11/19/15 PCP 11/17/15 IRIS 11/17/15 (s/p HAART 10/21 - 10/28)    P:   Per ID consult Bactrim (10/20 >>11/6 ), Unasyn (10/29 >>11/5 )  Azithro (10/26 >> ) Clindamycin and Primaquine (11/6 >> )  Solumedrol  40 mg BID  Fluconazole (11/2 >> )  ARVs held starting 10/28 with concern for IRIS    ENDOCRINE A:   DM exacerbated by steroids for Crohn's  11/26/15: Hypoglycemia  P:   Hold Lantus 15u and give Amp D50  SSI  FAMILY  - Updates: No family at bedside morning of 11/9    GLOBAL  only mom and daughter Nancy Acevedo know HIV diagnosis. Rest should not    Phill Myron, D.O. 11/26/2015, 6:52 AM PGY-2, [place]   Attending:  I have seen and examined the patient with nurse practitioner/resident and agree with the note above.  We formulated the plan together and I elicited the following history.    Fever overnight More hypoxemic More dysynchronous  On exam Gen: sedated on vent Pulm: rhonchi bilaterally, vent supported breath  Hypernatremia> increase free water today Hypoglycemia> holding lantus, continue ssi Nutrition> continue tube feedings PCP pneumonia> continue solumedrol, continue primaquin, clindamycin HIV> continue care as per ID ARDS> continue current vent settings, target paO2 55-65, wean FiO2/PEEP per protocol Vent synchrony > add methadone and clonazepam, continue fentanyl and versed Fever> pan culture,  no clear new pneumonia on CXR, order lower ext doppler  My cc time 35 minutes  Family updated, some dissent regarding desire for transfer to [facility], I will support that if they desire, have asked them to clarify if they truly want this.  Roselie Awkward, MD [place] [facility] Pager: [phone] Cell: [phone] After 3pm or if no response, call [phone]

## 2015-11-26 NOTE — Progress Notes (Signed)
Sputum sample obtained and sent down to main lab without complications.  

## 2015-11-26 NOTE — Progress Notes (Signed)
Went to see patient in response to RT's concern about her oxygen saturation drop to 85% that didn't improve with suction. On arrival, patient appears calm and sedated. Lung exam remarkable for rhonchi bilaterally. She is dyssynchronous with ventilator. I discussed patient with Dr. Lake Bells and ordered Rocuronium 1mg /kg once.  We have also increased her  Methadone to 20 mg twice a day and klonopin to 3 mg three times a day. EKG without QTc.  Patient's saturation improved to 89%.

## 2015-11-27 ENCOUNTER — Inpatient Hospital Stay (HOSPITAL_COMMUNITY): Payer: BC Managed Care – PPO

## 2015-11-27 LAB — BASIC METABOLIC PANEL
Anion gap: 8 (ref 5–15)
BUN: 119 mg/dL — AB (ref 6–20)
CHLORIDE: 118 mmol/L — AB (ref 101–111)
CO2: 24 mmol/L (ref 22–32)
CREATININE: 1.74 mg/dL — AB (ref 0.44–1.00)
Calcium: 7.1 mg/dL — ABNORMAL LOW (ref 8.9–10.3)
GFR calc non Af Amer: 32 mL/min — ABNORMAL LOW (ref >60)
GFR, EST AFRICAN AMERICAN: 37 mL/min — AB (ref >60)
Glucose, Bld: 209 mg/dL — ABNORMAL HIGH (ref 65–99)
Potassium: 4.8 mmol/L (ref 3.5–5.1)
Sodium: 150 mmol/L — ABNORMAL HIGH (ref 135–145)

## 2015-11-27 LAB — HEMOGLOBIN AND HEMATOCRIT, BLOOD
HEMATOCRIT: 26.1 % — AB (ref 36.0–46.0)
HEMOGLOBIN: 8.3 g/dL — AB (ref 12.0–15.0)

## 2015-11-27 LAB — BLOOD CULTURE ID PANEL (REFLEXED)
ACINETOBACTER BAUMANNII: NOT DETECTED
CANDIDA ALBICANS: NOT DETECTED
CANDIDA GLABRATA: NOT DETECTED
CANDIDA PARAPSILOSIS: NOT DETECTED
Candida krusei: NOT DETECTED
Candida tropicalis: NOT DETECTED
ENTEROBACTER CLOACAE COMPLEX: NOT DETECTED
ENTEROBACTERIACEAE SPECIES: NOT DETECTED
ESCHERICHIA COLI: NOT DETECTED
Enterococcus species: NOT DETECTED
HAEMOPHILUS INFLUENZAE: NOT DETECTED
KLEBSIELLA OXYTOCA: NOT DETECTED
Klebsiella pneumoniae: NOT DETECTED
Listeria monocytogenes: NOT DETECTED
METHICILLIN RESISTANCE: DETECTED — AB
Neisseria meningitidis: NOT DETECTED
PSEUDOMONAS AERUGINOSA: NOT DETECTED
Proteus species: NOT DETECTED
STREPTOCOCCUS PNEUMONIAE: NOT DETECTED
STREPTOCOCCUS PYOGENES: NOT DETECTED
Serratia marcescens: NOT DETECTED
Staphylococcus aureus (BCID): NOT DETECTED
Staphylococcus species: DETECTED — AB
Streptococcus agalactiae: NOT DETECTED
Streptococcus species: NOT DETECTED

## 2015-11-27 LAB — GLUCOSE, CAPILLARY
Glucose-Capillary: 173 mg/dL — ABNORMAL HIGH (ref 65–99)
Glucose-Capillary: 219 mg/dL — ABNORMAL HIGH (ref 65–99)

## 2015-11-27 LAB — URINE CULTURE: Culture: NO GROWTH

## 2015-11-27 LAB — PREPARE RBC (CROSSMATCH)

## 2015-11-27 LAB — PROTIME-INR
INR: 1.33
PROTHROMBIN TIME: 16.6 s — AB (ref 11.4–15.2)

## 2015-11-27 LAB — MAGNESIUM: Magnesium: 2.3 mg/dL (ref 1.7–2.4)

## 2015-11-27 LAB — POCT I-STAT 3, ART BLOOD GAS (G3+)
ACID-BASE DEFICIT: 4 mmol/L — AB (ref 0.0–2.0)
Bicarbonate: 21.9 mmol/L (ref 20.0–28.0)
O2 Saturation: 96 %
PO2 ART: 97 mmHg (ref 83.0–108.0)
TCO2: 23 mmol/L (ref 0–100)
pCO2 arterial: 46.6 mmHg (ref 32.0–48.0)
pH, Arterial: 7.28 — ABNORMAL LOW (ref 7.350–7.450)

## 2015-11-27 LAB — CBC
HEMATOCRIT: 21.7 % — AB (ref 36.0–46.0)
HEMOGLOBIN: 6.7 g/dL — AB (ref 12.0–15.0)
MCH: 28.9 pg (ref 26.0–34.0)
MCHC: 30.9 g/dL (ref 30.0–36.0)
MCV: 93.5 fL (ref 78.0–100.0)
Platelets: 16 10*3/uL — CL (ref 150–400)
RBC: 2.32 MIL/uL — ABNORMAL LOW (ref 3.87–5.11)
RDW: 20.2 % — ABNORMAL HIGH (ref 11.5–15.5)
WBC: 4.6 10*3/uL (ref 4.0–10.5)

## 2015-11-27 LAB — PHOSPHORUS: PHOSPHORUS: 6.6 mg/dL — AB (ref 2.5–4.6)

## 2015-11-27 MED ORDER — MORPHINE BOLUS VIA INFUSION
5.0000 mg | INTRAVENOUS | Status: DC | PRN
  Administered 2015-11-27: 10 mg via INTRAVENOUS
  Filled 2015-11-27: qty 20

## 2015-11-27 MED ORDER — SODIUM CHLORIDE 0.9 % IV SOLN
10.0000 mg/h | INTRAVENOUS | Status: DC
  Administered 2015-11-28: 15 mg/h via INTRAVENOUS
  Administered 2015-11-28: 20 mg/h via INTRAVENOUS
  Filled 2015-11-27 (×2): qty 20

## 2015-11-27 MED ORDER — MIDAZOLAM BOLUS VIA INFUSION (WITHDRAWAL LIFE SUSTAINING TX)
5.0000 mg | INTRAVENOUS | Status: DC | PRN
  Filled 2015-11-27: qty 20

## 2015-11-27 MED ORDER — MORPHINE BOLUS VIA INFUSION
5.0000 mg | INTRAVENOUS | Status: DC | PRN
  Filled 2015-11-27: qty 20

## 2015-11-27 MED ORDER — MIDAZOLAM BOLUS VIA INFUSION (WITHDRAWAL LIFE SUSTAINING TX)
5.0000 mg | INTRAVENOUS | Status: DC | PRN
  Administered 2015-11-27: 5 mg via INTRAVENOUS
  Filled 2015-11-27: qty 20

## 2015-11-27 MED ORDER — SODIUM CHLORIDE 0.9 % IV SOLN
10.0000 mg/h | INTRAVENOUS | Status: DC
  Filled 2015-11-27: qty 10

## 2015-11-27 MED ORDER — SODIUM CHLORIDE 0.9 % IV SOLN
10.0000 mg/h | INTRAVENOUS | Status: DC
  Administered 2015-11-27: 15 mg/h via INTRAVENOUS
  Filled 2015-11-27: qty 10

## 2015-11-27 MED ORDER — INSULIN GLARGINE 100 UNIT/ML ~~LOC~~ SOLN: 12.0000 [IU] | Freq: Every day | SUBCUTANEOUS | Status: DC

## 2015-11-27 MED ORDER — SODIUM CHLORIDE 0.9 % IV SOLN
10.0000 mg/h | INTRAVENOUS | Status: DC
  Filled 2015-11-27: qty 5
  Filled 2015-11-27: qty 10

## 2015-11-27 MED ORDER — SODIUM CHLORIDE 0.9 % IV SOLN
10.0000 mg/h | INTRAVENOUS | Status: DC
  Administered 2015-11-27: 10 mg/h via INTRAVENOUS
  Administered 2015-11-28: 25 mg/h via INTRAVENOUS
  Filled 2015-11-27 (×3): qty 5

## 2015-11-27 MED ORDER — METHADONE HCL 10 MG PO TABS
10.0000 mg | ORAL_TABLET | Freq: Four times a day (QID) | ORAL | Status: DC
  Administered 2015-11-27 – 2015-11-28 (×3): 10 mg
  Filled 2015-11-27 (×3): qty 1

## 2015-11-27 MED ORDER — METHADONE HCL 10 MG PO TABS: 10.0000 mg | ORAL_TABLET | Freq: Four times a day (QID) | ORAL | Status: DC

## 2015-11-27 MED ORDER — SODIUM CHLORIDE 0.9 % IV SOLN
Freq: Once | INTRAVENOUS | Status: AC
  Administered 2015-11-27: 07:00:00 via INTRAVENOUS

## 2015-11-27 NOTE — Progress Notes (Signed)
   11/27/15 1445  Clinical Encounter Type  Visited With Patient and family together  Visit Type Other (Comment) ([place] consult)  Referral From Nurse  Consult/Referral To Chaplain  Spiritual Encounters  Spiritual Needs Prayer;Emotional  Stress Factors  Patient Stress Factors Not reviewed  Family Stress Factors Family relationships  Mother and sister present with Pt. Offered prayer and emotional support.

## 2015-11-27 NOTE — Progress Notes (Signed)
PHARMACY - PHYSICIAN COMMUNICATION CRITICAL VALUE ALERT - BLOOD CULTURE IDENTIFICATION (BCID)  Results for orders placed or performed during the hospital encounter of 10/19/2015  Blood Culture ID Panel (Reflexed) (Collected: 11/26/2015 11:09 AM)  Result Value Ref Range   Enterococcus species NOT DETECTED NOT DETECTED   Listeria monocytogenes NOT DETECTED NOT DETECTED   Staphylococcus species DETECTED (A) NOT DETECTED   Staphylococcus aureus NOT DETECTED NOT DETECTED   Methicillin resistance DETECTED (A) NOT DETECTED   Streptococcus species NOT DETECTED NOT DETECTED   Streptococcus agalactiae NOT DETECTED NOT DETECTED   Streptococcus pneumoniae NOT DETECTED NOT DETECTED   Streptococcus pyogenes NOT DETECTED NOT DETECTED   Acinetobacter baumannii NOT DETECTED NOT DETECTED   Enterobacteriaceae species NOT DETECTED NOT DETECTED   Enterobacter cloacae complex NOT DETECTED NOT DETECTED   Escherichia coli NOT DETECTED NOT DETECTED   Klebsiella oxytoca NOT DETECTED NOT DETECTED   Klebsiella pneumoniae NOT DETECTED NOT DETECTED   Proteus species NOT DETECTED NOT DETECTED   Serratia marcescens NOT DETECTED NOT DETECTED   Haemophilus influenzae NOT DETECTED NOT DETECTED   Neisseria meningitidis NOT DETECTED NOT DETECTED   Pseudomonas aeruginosa NOT DETECTED NOT DETECTED   Candida albicans NOT DETECTED NOT DETECTED   Candida glabrata NOT DETECTED NOT DETECTED   Candida krusei NOT DETECTED NOT DETECTED   Candida parapsilosis NOT DETECTED NOT DETECTED   Candida tropicalis NOT DETECTED NOT DETECTED    Name of physician (or Provider) Contacted: Maylon Cos, PharmD - rounding with Dr. Titus Mould today; will mention during rounds  Changes to prescribed antibiotics required: No change indicated at this time  Belia Heman, PharmD PGY1 Pharmacy Resident [phone] (Pager) 11/27/2015 10:29 AM

## 2015-11-27 NOTE — Progress Notes (Addendum)
PULMONARY / CRITICAL CARE MEDICINE   Name: Nancy Acevedo MRN: [id number] DOB: 11-Apr-1960    ADMISSION DATE:  10/26/2015 CONSULTATION DATE:  10/20  REFERRING MD:  [facility]  CHIEF COMPLAINT: Can't breathe  Brief:  55 Y/O with hypertension, chronic kidney disease stage II, OSA, GERD, and recent diagnosis of Crohn's colitis managed with prednisone Admittedwith resp distress, fevers, B/L infitrates.  She was diagnosed with HIV, AIDS CD4 count <4. Presumed PCP vs bacterial PNA. Getting treatment with Abx, bactrim and steroids. Also on HAART therapy as per ID  LINES/TUBES: PICC 10/15 >>  ETT 10/31 >>  Foley 11/1 >>  NG/OT 11/1 >>   Events 10/21/2015  -admit  10/14 - fever resolved 11/02/15 - fever back 10/17 -- MRSA Pcr negative 11/06/15- ICU tx 11/07/15 - HIV +ve from10/20/17, RVP  Negative, C Diff negative .STart bipap and lasix. ID consult -> Rx PCP (DO NOT DISCLOSE TO FAMILY). AUTOIMMUNE ORDERED 10/29- Re consult for ongoing hypoxia 10/31 Increasing oxygen demands on 100% BiPap, unable to transition to [facility]; Intubated & BAL perfomed in right middle lobe  11/1: Family meeting where diagnosis disclosed to [name] and mother  11/2: Received 1u PRBC for HgB <7. Started TF.  11/20/15 - started nimbex for severe ARDS 11/21/15 - severe ards persists 70% fio2, peep 12. Not on pressors.Making urine. Creat better. Mother at bedside with reverend and her boyfriend Jenny Reichmann 11/22/15: Met indication for prone ventilation at 70% fio2/peep 12 w/ pulse ox 91%. AKI improving.  11/23/15: Platelets low to 29. Switched Bactrim to Clinda/Primaquin. Discontinued Nimbex and prone protocol.  11/24/15: Weaned FiO2 to 70% without any desaturations.  11/25/15: HgB 6.6. Transfused with 1u PRBC with good response. Family meeting >> DNR status, will not escalate care and plan for comfort care if worsens  11/26/15: Febrile. Pan-cultured and LE dopplers ordered. No evidence of PNA on CXR.     SUBJECTIVE/OVERNIGHT/INTERVAL HX 11/27/15: Appropriate O2 saturation on current vent settings. Syncing well. Sedated.   VITAL SIGNS: BP 94/62   Pulse 75   Temp (!) 94.2 F (34.6 C) (Rectal)   Resp (!) 35   Ht '5\' 4"'$  (1.626 m)   Wt 203 lb 11.3 oz (92.4 kg)   SpO2 97%   BMI 34.97 kg/m   HEMODYNAMICS:    VENTILATOR SETTINGS: Vent Mode: PRVC FiO2 (%):  [90 %-100 %] 100 % Set Rate:  [35 bmp] 35 bmp Vt Set:  [380 mL] 380 mL PEEP:  [14 cmH20] 14 cmH20 Plateau Pressure:  [28 cmH20-34 cmH20] 28 cmH20  INTAKE / OUTPUT: I/O last 3 completed shifts: In: 4532.7 [I.V.:2087.7; Blood:25; NG/GT:2220; IV PIRJJOACZ:660] Out: 2365 [Urine:2190; Emesis/NG output:175]  PHYSICAL EXAMINATION: General:  Critically ill looking  Neuro: RASS -5. No withdrawal to pain. Not responsive to voice.  HEENT: ETT in place. Eyes closed.  Cardiovascular:  RRR, Nl S1/S2, No MRG. Lungs:  Scattered rhonchi. Vent supported breath.  Abdomen:  soft +bs, non-distended  Skin: warm and dry  LABS:  PULMONARY  Recent Labs Lab 11/20/15 2120 11/21/15 0329 11/23/15 0951 11/24/15 0804 11/25/15 1455  PHART 7.317* 7.325* 7.390 7.338* 7.365  PCO2ART 41.7 42.7 55.2* 56.5* 49.3*  PO2ART 52.9* 67.0* 57.0* 57.8* 73.0*  HCO3 20.8 22.3 33.5* 29.5* 28.2*  TCO2  --  24 35  --  30  O2SAT 84.1 92.0 88.0 86.2 94.0   CBC  Recent Labs Lab 11/25/15 1200 11/26/15 0410 11/27/15 0500  HGB 8.6* 8.9* 6.7*  HCT 27.5* 28.9* 21.7*  WBC 5.4 5.3 4.6  PLT 23* 24* 16*   COAGULATION No results for input(s): INR in the last 168 hours.  CARDIAC  No results for input(s): TROPONINI in the last 168 hours. No results for input(s): PROBNP in the last 168 hours.  CHEMISTRY  Recent Labs Lab 11/23/15 0430 11/24/15 0414 11/25/15 0350 11/26/15 0410 11/27/15 0500  NA 147* 150* 152* 153* 150*  K 3.5 3.7 4.0 4.6 4.8  CL 107 110 114* 116* 118*  CO2 '30 29 27 26 24  '$ GLUCOSE 166* 194* 192* 124* 209*  BUN 79* 90* 92* 91*  119*  CREATININE 1.29* 1.41* 1.31* 1.20* 1.74*  CALCIUM 7.4* 7.9* 8.2* 8.3* 7.1*  MG 2.2 2.5* 2.5* 2.4 2.3  PHOS 3.2 3.7 4.8* 4.9* 6.6*   Estimated Creatinine Clearance: 40.7 mL/min (by C-G formula based on SCr of 1.74 mg/dL (H)).  LIVER No results for input(s): AST, ALT, ALKPHOS, BILITOT, PROT, ALBUMIN, INR in the last 168 hours. INFECTIOUS No results for input(s): LATICACIDVEN, PROCALCITON in the last 168 hours.  ENDOCRINE CBG (last 3)   Recent Labs  11/26/15 1953 11/26/15 2344 11/27/15 0400  GLUCAP 117* 140* 173*   IMAGING x48h  - image(s) personally visualized  -   highlighted in bold Dg Chest Port 1 View  Result Date: 11/26/2015 CLINICAL DATA:  Assess et tube EXAM: PORTABLE CHEST 1 VIEW COMPARISON:  None. FINDINGS: Endotracheal tube, central venous line, and NG tube unchanged. Diffuse pulmonary edema pattern unchanged. Normal cardiac silhouette. No pneumothorax. IMPRESSION: 1. Stable support apparatus. 2. Diffuse pulmonary edema / ARDS unchanged. Electronically Signed   By: Suzy Bouchard M.D.   On: 11/26/2015 07:58     ASSESSMENT / PLAN:  PULMONARY A: Admit:  Acute hypoxic resp failure with infiltrates  Current:  PCP Pneumonia  ARDS due to Alveolar Hemorrhage possibly worsened due to IRS  P:   ards net protocol on vent Continue Abx per ID Steroids for PCP Switched to DNR status, will not escalate care, comfort care if worsens  abg reviewed, dc further ABG No pcxr needed in am   CARDIOVASCULAR A:  Hx of HTN CC 1/17 with no CAD  P:  Prn hydralazine and metoprolol  Continue metoprolol 12.5 mg BID  No pressors  RENAL A:   CRI with AKI Hypernatremia, improving   P:   Follow urine output and Cr. 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   P:   PPI NPO at 7 am for likely comfort  TFs initiated 11/2    HEMATOLOGIC  Recent Labs  11/26/15 0410 11/27/15 0500  HGB 8.9* 6.7*    A:   Anemia of chronic and critical illness- s/p 2uPRBC  DVT protection Thrombocytopenia  P:  SCDs  Transfusion will not change outcome Limit phlebotomy  INFECTIOUS Blood Cx 10/28: NG (final) BAL Culture 10/31: NGTD Pneumocystis Smear 10/31: Positive - Concern for IRIS  Blood Cx 11/9: GPC in clusters    A:   HIV/AIDS Trush 11/19/15 PCP 11/17/15 IRIS 11/17/15 (s/p HAART 10/21 - 10/28)    P:   Per ID consult Bactrim (10/20 >>11/6 ), Unasyn (10/29 >>11/5 ) OFF Azithro (10/26 >> ) Clindamycin and Primaquine (11/6 >> )  Solumedrol 40 mg BID  Fluconazole (11/2 >>>11/9 ARVs held starting 10/28 with concern for IRIS    ENDOCRINE A:   DM exacerbated by steroids for Crohn's  11/27/15:  CBGs up-trending after episode of hypoglycemia 11/9  P:   Give decreased dose of Lantus today  SSI  FAMILY  - Updates: No family at bedside morning of 11/10   GLOBAL  only mom and daughter Audery Amel know HIV diagnosis. Rest should not   Phill Myron, D.O. 11/27/2015, 7:07 AM PGY-2, [facility]  STAFF NOTE: I, Merrie Roof, MD FACP have personally reviewed patient's available data, including medical history, events of note, physical examination and test results as part of my evaluation. I have discussed with resident/NP and other care providers such as pharmacist, RN and RRT. In addition, I personally evaluated patient and elicited key findings of: vented, high RR, coarse BS, abdo soft, does not fc, scd, min edema, pcxr with severe ards, peep 14, 100%, no escalation, worsening arf, anemia, keep plat elss 30 , allow pos balance with crt rise, maintain steroids at current dose, no bactrim, keep current treatment, appears comfort care is appropriate in am , DR Mcquaid to assess in am conversation likely, NO further transfusion, will not change outcome, methadone to q6h, assess fent needs, I  have updated famil;y The patient is critically ill with multiple organ systems failure and requires high complexity decision making for assessment and support, frequent evaluation and titration of therapies, application of advanced monitoring technologies and extensive interpretation of multiple databases.   Critical Care Time devoted to patient care services described in this note is 30 Minutes. This time reflects time of care of this signee: Merrie Roof, MD FACP. This critical care time does not reflect procedure time, or teaching time or supervisory time of PA/NP/Med student/Med Resident etc but could involve care discussion time. Rest per NP/medical resident whose note is outlined above and that I agree with   Lavon Paganini. Titus Mould, [place] Pgr: [facility] 11/27/2015 10:21 AM   D/w daughter. Wishes to start comfort meds while family visits further. No escalation, comfort is plan in am likely after further visitors. Stop all over therapy  Lavon Paganini. Titus Mould, MD, [place] Pgr: [facility]

## 2015-11-28 LAB — TYPE AND SCREEN
ABO/RH(D): A POS
Antibody Screen: NEGATIVE
UNIT DIVISION: 0
Unit division: 0

## 2015-11-28 LAB — CULTURE, RESPIRATORY

## 2015-11-28 LAB — CULTURE, RESPIRATORY W GRAM STAIN: Gram Stain: NONE SEEN

## 2015-11-29 LAB — CULTURE, BLOOD (ROUTINE X 2)

## 2015-12-01 LAB — CULTURE, BLOOD (ROUTINE X 2): CULTURE: NO GROWTH

## 2015-12-02 ENCOUNTER — Telehealth: Payer: Self-pay

## 2015-12-02 NOTE — Telephone Encounter (Signed)
On 12/02/2015 I received a death certificate from [place]. (burial). The death certificate is for burial. The patient is a patient of Doctor Titus Mould. The death certificate will be taken to [facility] ([address]) for signature this am.  On 2016/01/02 I received the death certificate back from Doctor Titus Mould. I got the death certificate ready and called the funeral home to let them know the death certificate is ready for pickup.

## 2015-12-15 LAB — FUNGUS CULTURE WITH STAIN

## 2015-12-15 LAB — FUNGAL ORGANISM REFLEX

## 2015-12-15 LAB — FUNGUS CULTURE RESULT

## 2015-12-18 ENCOUNTER — Ambulatory Visit: Payer: BC Managed Care – PPO | Admitting: Gastroenterology

## 2015-12-18 NOTE — Discharge Summary (Signed)
[facility] Death Note  Date of death December 05, 2015 Date of admission: 2015/11/06  Cause of Death: PCP pneumonia, ARDS, AIDS  Hospital course and HPI: 55 y/o female with recently diagnosed Crohns admitted with respiratory distress on 2015/11/06. Her respiratory failure worsened and she required intubation.  An HIV test was noted to be positive after admission; this was a new diagnosis for her.  She had a bronchoscopy which on BAL analysis revealed evidence of PCP pneumonia. She was treated with appropriate antimicrobial therapy and steroids and ID was consulted. However despite this her oxygenation needs increased significantly despite full ventilator support.  We had lengthy discussions with the patient's family.  Her daughter Nancy Acevedo and Mother Nancy Acevedo were informed of the HIV diagnosis and asked that we keep this information private from the rest of the family.  After lengthy time on the ventilator with worsening hypoxemia we discussed the situation with Nancy Acevedo and Nancy Acevedo and we decided together that withdrawal of care was appropriate given her grim prognosis and lack of response to therapy.  She died peacefully with her family at the bedside.  Roselie Awkward, MD [place] [facility] Pager: [phone] After 3pm or if no response, call [phone]

## 2015-12-18 NOTE — Progress Notes (Signed)
   12/17/2015 1000  Clinical Encounter Type  Visited With Family  Visit Type Initial  Referral From Chaplain  Consult/Referral To Chaplain  Spiritual Encounters  Spiritual Needs Grief support  Stress Factors  Family Stress Factors Loss  CHP asked by daughter to pray over patient who passed.  Provided emotional, grief and prayer support. Roe Coombs 12/17/15

## 2015-12-18 NOTE — Progress Notes (Signed)
Wasted 200 ml of morpine gtt, and 100 cc midazolam , witnessed by Alfred Levins, RN

## 2015-12-18 NOTE — Progress Notes (Signed)
Patient expired at 28. Family present at bedside. Emotional support provided. [place] [facility] services notified.

## 2015-12-18 NOTE — Progress Notes (Signed)
MD requested (through RN) CM write an absentee excuse note on [facility] letterhead for daughter of pt who has been at the bedside from 11/8-present day; CM wrote such letter without any specific medical information or names per HIPAA.  No other CM needs were communicated.

## 2015-12-18 DEATH — deceased

## 2015-12-31 LAB — ACID FAST CULTURE WITH REFLEXED SENSITIVITIES: ACID FAST CULTURE - AFSCU3: NEGATIVE

## 2015-12-31 LAB — ACID FAST CULTURE WITH REFLEXED SENSITIVITIES (MYCOBACTERIA)

## 2017-02-13 IMAGING — DX DG CHEST 2V
2 series · 2 of 2 positions shown · non-contrast
Comparison: 10/17/2014

CLINICAL DATA: Dizziness.  Syncope.

EXAM:
CHEST  2 VIEW

[w chest pa]
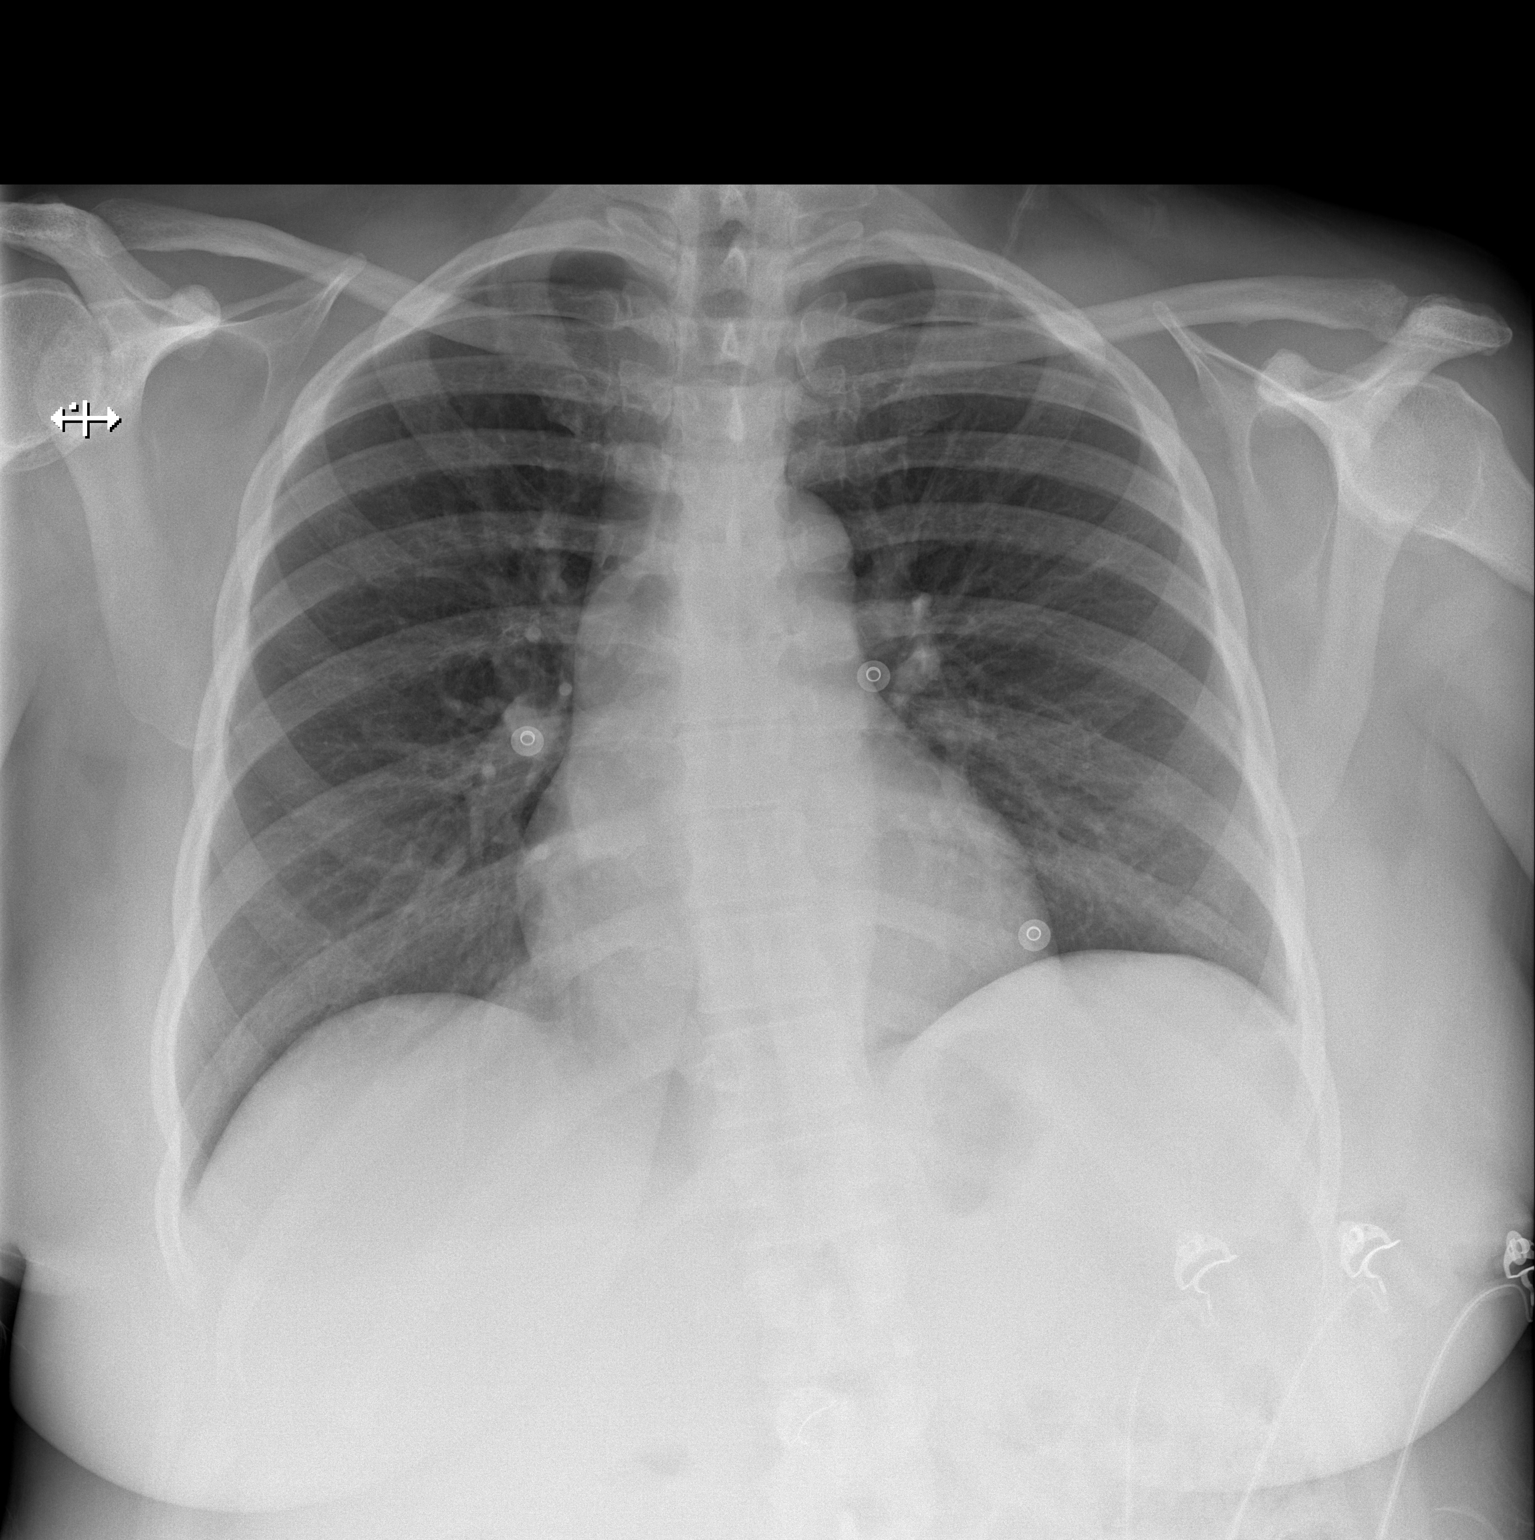

[w chest lat]
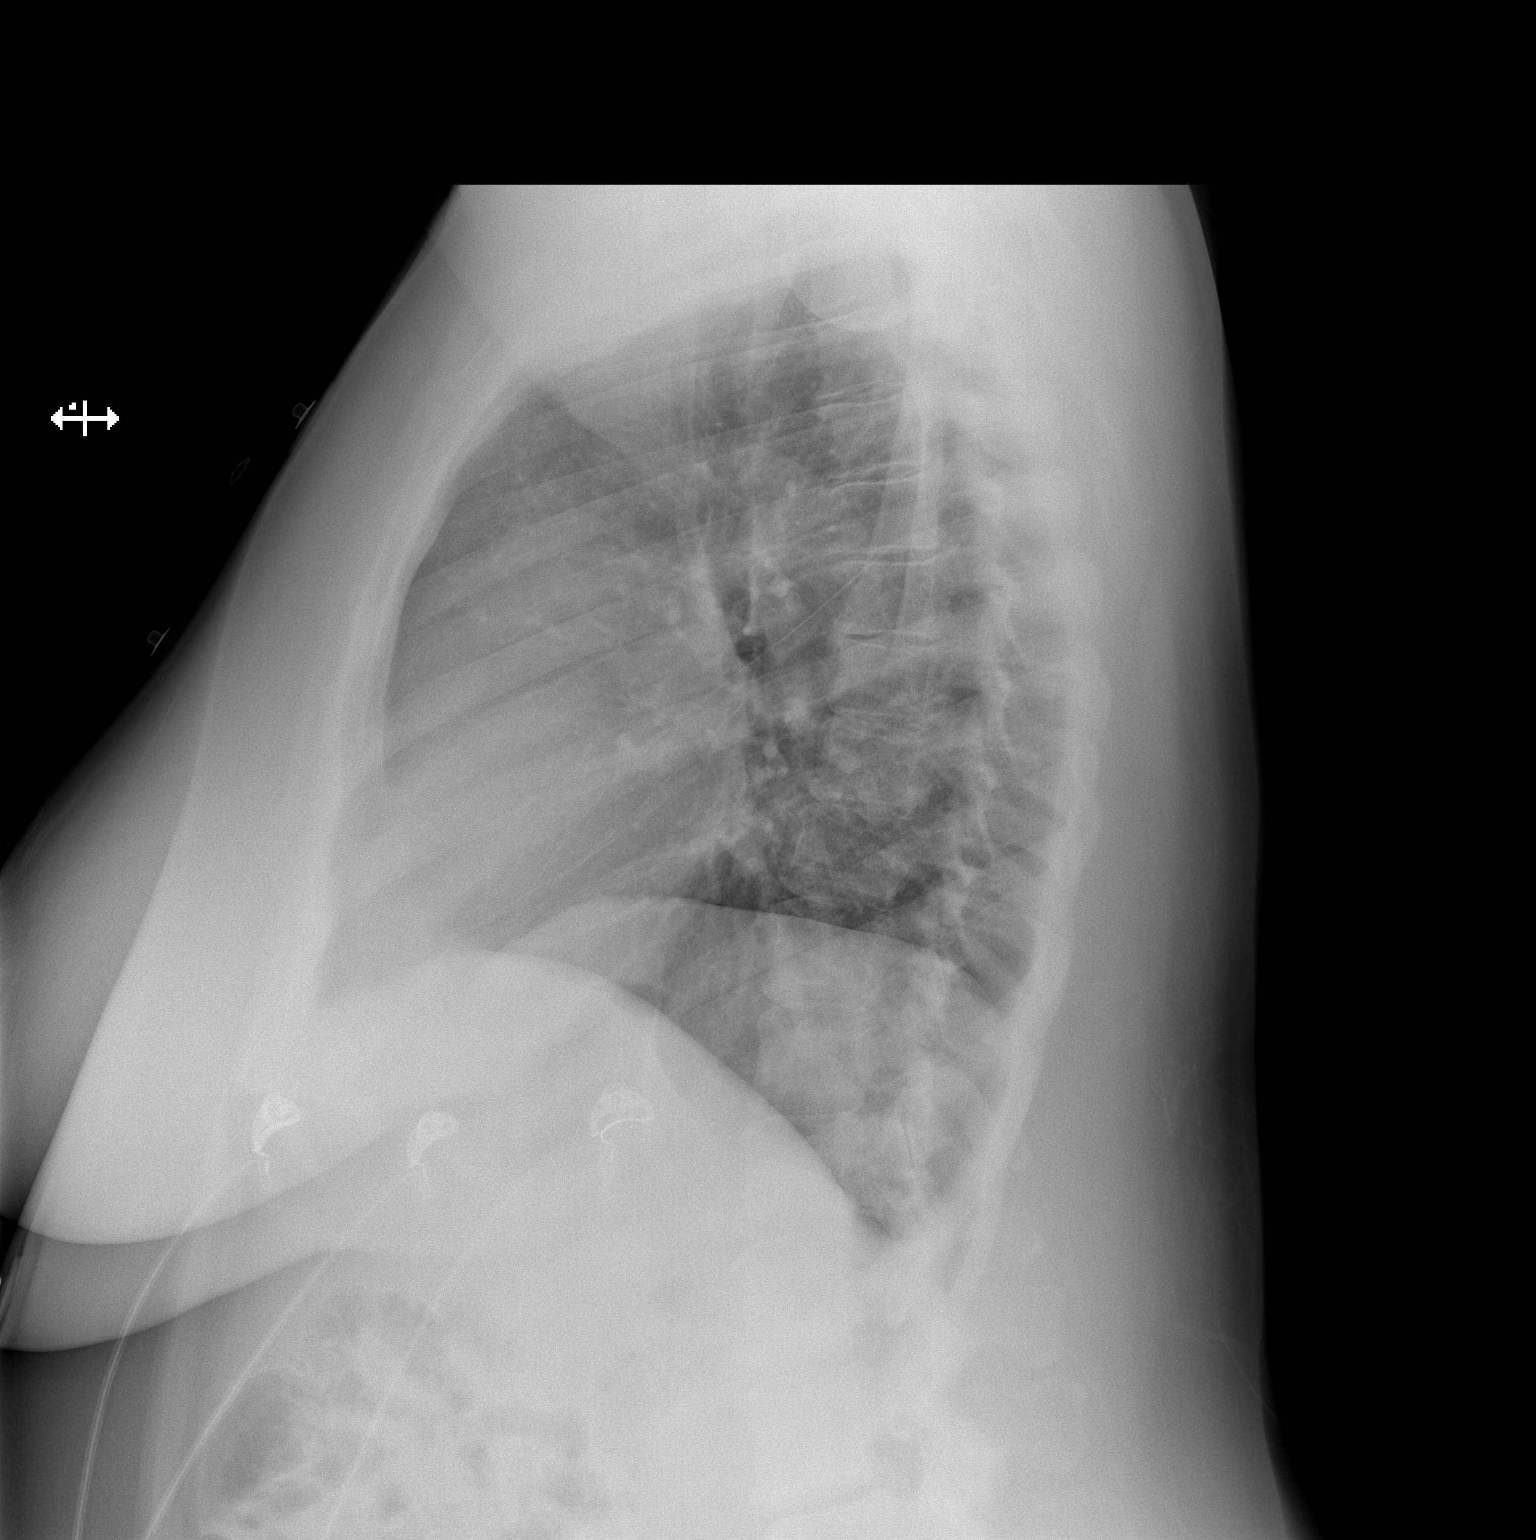

[2 of 2 positions shown; findings below may reference images not displayed]

FINDINGS: Dextroconvex thoracic scoliosis, mild.  Mild thoracic spondylosis.

The lungs appear clear. Cardiac and mediastinal contours normal. No
pleural effusion identified.
IMPRESSION: 1. No acute findings.
2. Mild thoracic spondylosis and mild dextroconvex thoracic
scoliosis.

## 2017-11-05 IMAGING — CR DG CHEST 2V
2 series · 2 of 2 positions shown · non-contrast
Comparison: 10/30/2015

CLINICAL DATA: Pulmonary edema.

EXAM:
CHEST  2 VIEW

[chest pa]
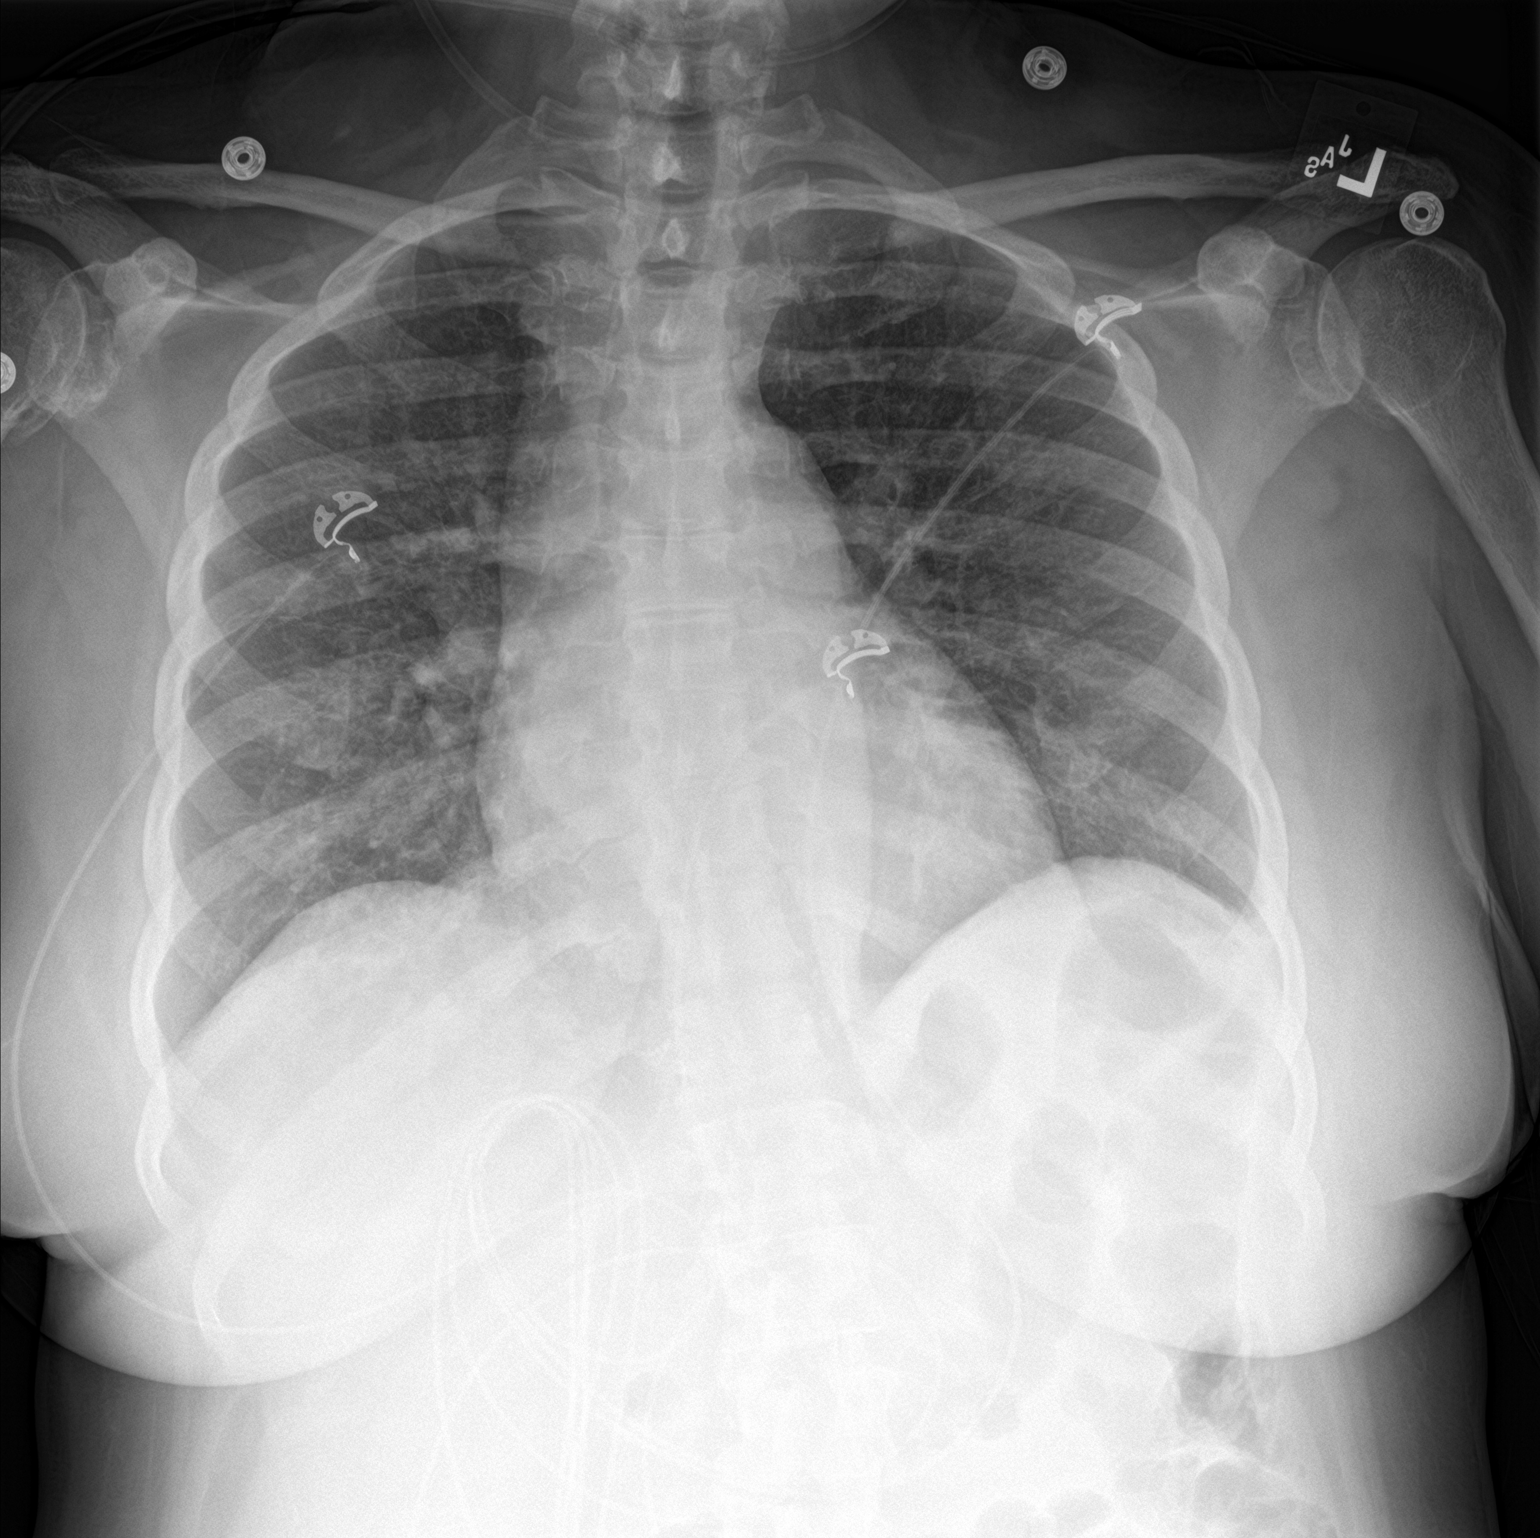

[chest lat]
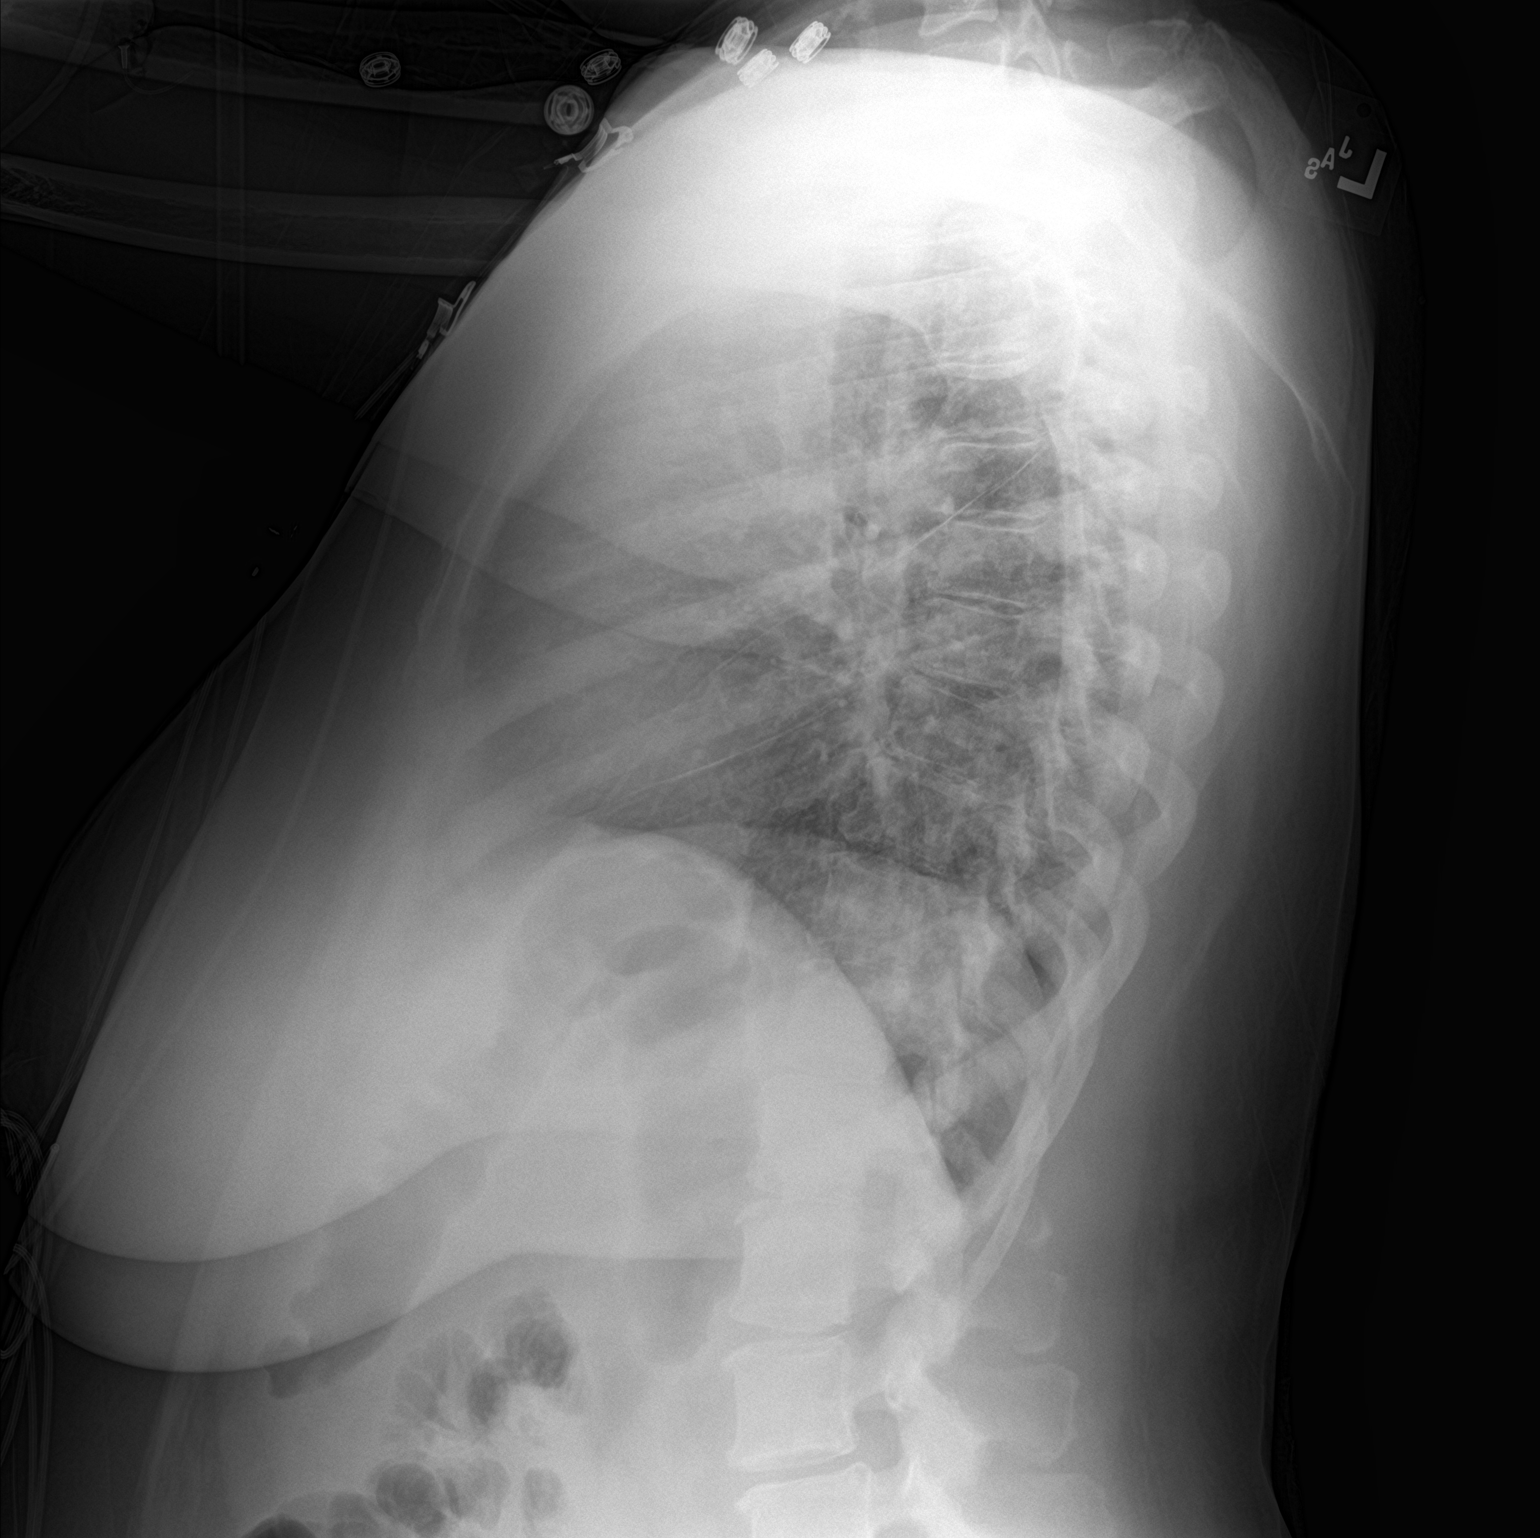

[2 of 2 positions shown; findings below may reference images not displayed]

FINDINGS: Two views study shows upper normal heart size. Diffuse interstitial
opacity suggests edema. No focal airspace consolidation or
substantial pleural effusion. The visualized bony structures of the
thorax are intact. Telemetry leads overlie the chest.
IMPRESSION: Slight increase in diffuse interstitial opacities suggesting edema.

## 2017-11-23 IMAGING — CR DG CHEST 1V PORT
1 series · 1 of 1 positions shown · non-contrast
Comparison: Portable chest x-ray November 18, 2015

CLINICAL DATA: Intubated patient, respiratory failure.

EXAM:
PORTABLE CHEST 1 VIEW

[portable]
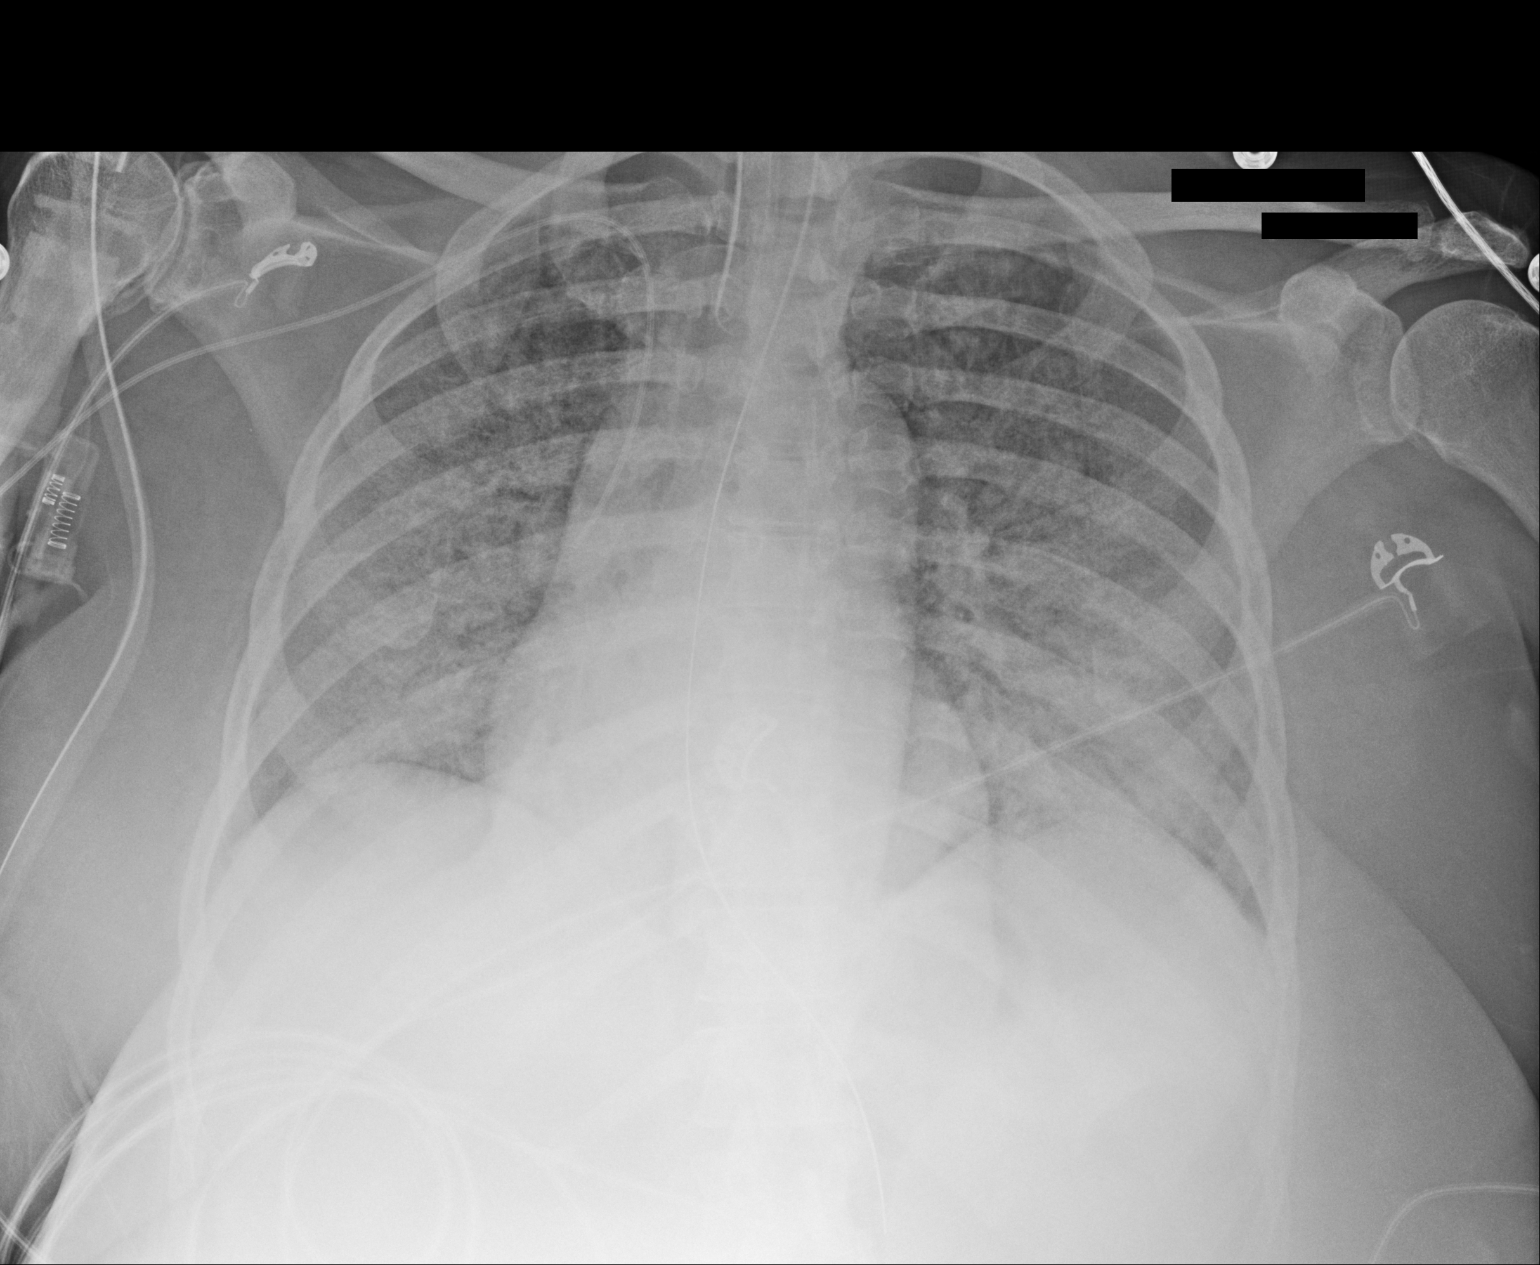

[1 of 1 positions shown; findings below may reference images not displayed]

FINDINGS: The lungs are adequately inflated. The interstitial markings are
increased and more confluent than on yesterday's study. The heart is
normal in size. The pulmonary vascularity is indistinct. There is no
pleural effusion or pneumothorax. The endotracheal tube tip projects
4.6 cm above the carina. The esophagogastric tube tip in proximal
port project below the inferior margin of the image. The right-sided
PICC line tip projects over the midportion of the SVC.
IMPRESSION: Worsening of interstitial and alveolar opacities consistent with
progressive pneumonia or less likely pulmonary edema. The support
tubes are in stable position.
